# Patient Record
Sex: Male | Born: 1945
Health system: Southern US, Community
[De-identification: ages and names within clinical notes are randomized; demographics above are authoritative.]

## PROBLEM LIST (undated history)

## (undated) DIAGNOSIS — M4800 Spinal stenosis, site unspecified: Secondary | ICD-10-CM

## (undated) DIAGNOSIS — I495 Sick sinus syndrome: Secondary | ICD-10-CM

## (undated) DIAGNOSIS — I639 Cerebral infarction, unspecified: Secondary | ICD-10-CM

## (undated) DIAGNOSIS — R7989 Other specified abnormal findings of blood chemistry: Secondary | ICD-10-CM

## (undated) DIAGNOSIS — H47019 Ischemic optic neuropathy, unspecified eye: Secondary | ICD-10-CM

## (undated) DIAGNOSIS — R945 Abnormal results of liver function studies: Secondary | ICD-10-CM

## (undated) DIAGNOSIS — K219 Gastro-esophageal reflux disease without esophagitis: Secondary | ICD-10-CM

## (undated) DIAGNOSIS — R001 Bradycardia, unspecified: Secondary | ICD-10-CM

## (undated) DIAGNOSIS — K644 Residual hemorrhoidal skin tags: Secondary | ICD-10-CM

## (undated) DIAGNOSIS — I10 Essential (primary) hypertension: Secondary | ICD-10-CM

## (undated) DIAGNOSIS — L0291 Cutaneous abscess, unspecified: Secondary | ICD-10-CM

## (undated) DIAGNOSIS — E785 Hyperlipidemia, unspecified: Secondary | ICD-10-CM

## (undated) HISTORY — DX: Cutaneous abscess, unspecified: L02.91

## (undated) HISTORY — DX: Residual hemorrhoidal skin tags: K64.4

## (undated) HISTORY — PX: PACEMAKER INSERTION: SHX728

## (undated) HISTORY — DX: Other specified abnormal findings of blood chemistry: R79.89

## (undated) HISTORY — DX: Sick sinus syndrome: I49.5

## (undated) HISTORY — DX: Abnormal results of liver function studies: R94.5

## (undated) HISTORY — DX: Ischemic optic neuropathy, unspecified eye: H47.019

## (undated) HISTORY — PX: OTHER SURGICAL HISTORY: SHX169

## (undated) HISTORY — PX: EYE SURGERY: SHX253

## (undated) HISTORY — DX: Hyperlipidemia, unspecified: E78.5

## (undated) HISTORY — DX: Gastro-esophageal reflux disease without esophagitis: K21.9

## (undated) HISTORY — DX: Cerebral infarction, unspecified: I63.9

## (undated) HISTORY — DX: Bradycardia, unspecified: R00.1

## (undated) HISTORY — DX: Spinal stenosis, site unspecified: M48.00

## (undated) HISTORY — DX: Essential (primary) hypertension: I10

## (undated) HISTORY — PX: ABSCESS DRAINAGE: SHX1119

---

## 1994-11-30 HISTORY — PX: CARDIAC CATHETERIZATION: SHX172

## 2001-09-09 ENCOUNTER — Ambulatory Visit (HOSPITAL_COMMUNITY): Admission: RE | Admit: 2001-09-09 | Discharge: 2001-09-09 | Payer: Self-pay | Admitting: Cardiology

## 2001-09-09 ENCOUNTER — Encounter: Payer: Self-pay | Admitting: Internal Medicine

## 2001-09-13 ENCOUNTER — Encounter: Payer: Self-pay | Admitting: Internal Medicine

## 2002-07-22 ENCOUNTER — Emergency Department (HOSPITAL_COMMUNITY): Admission: EM | Admit: 2002-07-22 | Discharge: 2002-07-22 | Payer: Self-pay | Admitting: Emergency Medicine

## 2002-09-15 ENCOUNTER — Ambulatory Visit (HOSPITAL_COMMUNITY): Admission: RE | Admit: 2002-09-15 | Discharge: 2002-09-16 | Payer: Self-pay | Admitting: Internal Medicine

## 2002-09-15 ENCOUNTER — Encounter: Payer: Self-pay | Admitting: Internal Medicine

## 2002-09-16 ENCOUNTER — Encounter: Payer: Self-pay | Admitting: Internal Medicine

## 2003-02-07 ENCOUNTER — Observation Stay (HOSPITAL_COMMUNITY): Admission: RE | Admit: 2003-02-07 | Discharge: 2003-02-08 | Payer: Self-pay | Admitting: General Surgery

## 2003-02-07 ENCOUNTER — Encounter (INDEPENDENT_AMBULATORY_CARE_PROVIDER_SITE_OTHER): Payer: Self-pay | Admitting: *Deleted

## 2003-02-07 ENCOUNTER — Encounter: Payer: Self-pay | Admitting: General Surgery

## 2003-02-07 HISTORY — PX: CHOLECYSTECTOMY: SHX55

## 2004-06-23 ENCOUNTER — Encounter: Payer: Self-pay | Admitting: Internal Medicine

## 2004-06-23 HISTORY — PX: COLONOSCOPY: SHX174

## 2004-10-16 ENCOUNTER — Ambulatory Visit: Payer: Self-pay | Admitting: Internal Medicine

## 2004-11-25 ENCOUNTER — Ambulatory Visit: Payer: Self-pay

## 2005-01-01 ENCOUNTER — Ambulatory Visit: Payer: Self-pay | Admitting: Internal Medicine

## 2005-01-14 ENCOUNTER — Ambulatory Visit: Payer: Self-pay | Admitting: Internal Medicine

## 2005-01-27 ENCOUNTER — Ambulatory Visit: Payer: Self-pay

## 2005-04-03 ENCOUNTER — Ambulatory Visit: Payer: Self-pay | Admitting: Internal Medicine

## 2005-04-15 ENCOUNTER — Ambulatory Visit: Payer: Self-pay | Admitting: Internal Medicine

## 2005-09-16 ENCOUNTER — Ambulatory Visit: Payer: Self-pay | Admitting: Internal Medicine

## 2005-09-16 ENCOUNTER — Encounter: Admission: RE | Admit: 2005-09-16 | Discharge: 2005-09-16 | Payer: Self-pay | Admitting: Internal Medicine

## 2005-09-28 ENCOUNTER — Ambulatory Visit: Payer: Self-pay | Admitting: Internal Medicine

## 2005-10-29 ENCOUNTER — Ambulatory Visit: Payer: Self-pay | Admitting: Internal Medicine

## 2005-12-21 ENCOUNTER — Ambulatory Visit: Payer: Self-pay | Admitting: Internal Medicine

## 2005-12-28 ENCOUNTER — Ambulatory Visit: Payer: Self-pay | Admitting: Internal Medicine

## 2006-02-19 ENCOUNTER — Ambulatory Visit: Payer: Self-pay | Admitting: Internal Medicine

## 2006-04-16 ENCOUNTER — Ambulatory Visit: Payer: Self-pay | Admitting: Internal Medicine

## 2006-04-23 ENCOUNTER — Ambulatory Visit: Payer: Self-pay | Admitting: Internal Medicine

## 2006-07-23 ENCOUNTER — Ambulatory Visit: Payer: Self-pay | Admitting: Internal Medicine

## 2006-08-06 ENCOUNTER — Ambulatory Visit: Payer: Self-pay | Admitting: Internal Medicine

## 2006-08-11 ENCOUNTER — Encounter: Payer: Self-pay | Admitting: Internal Medicine

## 2006-08-11 ENCOUNTER — Ambulatory Visit: Payer: Self-pay | Admitting: Internal Medicine

## 2006-11-03 ENCOUNTER — Ambulatory Visit: Payer: Self-pay | Admitting: Internal Medicine

## 2006-11-03 LAB — CONVERTED CEMR LAB
ALT: 26 units/L (ref 0–40)
AST: 21 units/L (ref 0–37)
Albumin: 4.3 g/dL (ref 3.5–5.2)
Alkaline Phosphatase: 69 units/L (ref 39–117)
BUN: 23 mg/dL (ref 6–23)
Basophils Absolute: 0.1 10*3/uL (ref 0.0–0.1)
Basophils Relative: 1 % (ref 0.0–1.0)
CO2: 30 meq/L (ref 19–32)
Calcium: 9.7 mg/dL (ref 8.4–10.5)
Chloride: 107 meq/L (ref 96–112)
Chol/HDL Ratio, serum: 3.5
Cholesterol: 155 mg/dL (ref 0–200)
Creatinine, Ser: 1.4 mg/dL (ref 0.4–1.5)
Eosinophil percent: 2.5 % (ref 0.0–5.0)
GFR calc non Af Amer: 55 mL/min
Glomerular Filtration Rate, Af Am: 66 mL/min/{1.73_m2}
Glucose, Bld: 101 mg/dL — ABNORMAL HIGH (ref 70–99)
HCT: 46.9 % (ref 39.0–52.0)
HDL: 44.2 mg/dL (ref >39.0)
Hemoglobin: 15.3 g/dL (ref 13.0–17.0)
LDL DIRECT: 84.4 mg/dL
Lymphocytes Relative: 21.3 % (ref 12.0–46.0)
MCHC: 32.6 g/dL (ref 30.0–36.0)
MCV: 89.7 fL (ref 78.0–100.0)
Monocytes Absolute: 0.7 10*3/uL (ref 0.2–0.7)
Monocytes Relative: 7.6 % (ref 3.0–11.0)
Neutro Abs: 5.9 10*3/uL (ref 1.4–7.7)
Neutrophils Relative %: 67.6 % (ref 43.0–77.0)
PSA: 0.84 ng/mL (ref 0.10–4.00)
Platelets: 249 10*3/uL (ref 150–400)
Potassium: 4.6 meq/L (ref 3.5–5.1)
RBC: 5.22 M/uL (ref 4.22–5.81)
RDW: 12.7 % (ref 11.5–14.6)
Sodium: 143 meq/L (ref 135–145)
TSH: 4.14 microintl units/mL (ref 0.35–5.50)
Total Bilirubin: 1.3 mg/dL — ABNORMAL HIGH (ref 0.3–1.2)
Total Protein: 7 g/dL (ref 6.0–8.3)
Triglyceride fasting, serum: 215 mg/dL (ref 0–149)
VLDL: 43 mg/dL — ABNORMAL HIGH (ref 0–40)
WBC: 8.8 10*3/uL (ref 4.5–10.5)

## 2006-11-16 ENCOUNTER — Ambulatory Visit: Payer: Self-pay | Admitting: Internal Medicine

## 2007-01-24 ENCOUNTER — Ambulatory Visit: Payer: Self-pay | Admitting: Internal Medicine

## 2007-01-24 LAB — CONVERTED CEMR LAB
ALT: 26 units/L (ref 0–40)
AST: 20 units/L (ref 0–37)
Albumin: 4 g/dL (ref 3.5–5.2)
Alkaline Phosphatase: 83 units/L (ref 39–117)
Bilirubin, Direct: 0.1 mg/dL (ref 0.0–0.3)
Cholesterol: 282 mg/dL (ref 0–200)
Direct LDL: 146.1 mg/dL
HDL: 37.4 mg/dL — ABNORMAL LOW (ref >39.0)
Total Bilirubin: 1.1 mg/dL (ref 0.3–1.2)
Total CHOL/HDL Ratio: 7.5
Total Protein: 6.6 g/dL (ref 6.0–8.3)
Triglycerides: 640 mg/dL (ref 0–149)
VLDL: 128 mg/dL — ABNORMAL HIGH (ref 0–40)

## 2007-02-09 ENCOUNTER — Ambulatory Visit: Payer: Self-pay | Admitting: Internal Medicine

## 2007-03-09 ENCOUNTER — Ambulatory Visit: Payer: Self-pay | Admitting: Internal Medicine

## 2007-03-09 LAB — CONVERTED CEMR LAB
ALT: 24 units/L (ref 0–40)
AST: 18 units/L (ref 0–37)
Albumin: 4 g/dL (ref 3.5–5.2)
Alkaline Phosphatase: 74 units/L (ref 39–117)
BUN: 20 mg/dL (ref 6–23)
Bilirubin, Direct: 0.1 mg/dL (ref 0.0–0.3)
CO2: 31 meq/L (ref 19–32)
Calcium: 9.4 mg/dL (ref 8.4–10.5)
Chloride: 108 meq/L (ref 96–112)
Cholesterol: 163 mg/dL (ref 0–200)
Creatinine, Ser: 1.2 mg/dL (ref 0.4–1.5)
Direct LDL: 78.7 mg/dL
GFR calc Af Amer: 79 mL/min
GFR calc non Af Amer: 66 mL/min
Glucose, Bld: 100 mg/dL — ABNORMAL HIGH (ref 70–99)
HDL: 39.1 mg/dL (ref >39.0)
Potassium: 4.5 meq/L (ref 3.5–5.1)
Sodium: 143 meq/L (ref 135–145)
Total Bilirubin: 1.2 mg/dL (ref 0.3–1.2)
Total CHOL/HDL Ratio: 4.2
Total Protein: 6.6 g/dL (ref 6.0–8.3)
Triglycerides: 317 mg/dL (ref 0–149)
VLDL: 63 mg/dL — ABNORMAL HIGH (ref 0–40)

## 2007-03-16 ENCOUNTER — Ambulatory Visit: Payer: Self-pay | Admitting: Internal Medicine

## 2007-06-02 DIAGNOSIS — H47019 Ischemic optic neuropathy, unspecified eye: Secondary | ICD-10-CM

## 2007-06-02 DIAGNOSIS — I498 Other specified cardiac arrhythmias: Secondary | ICD-10-CM

## 2007-06-02 DIAGNOSIS — I10 Essential (primary) hypertension: Secondary | ICD-10-CM

## 2007-06-30 ENCOUNTER — Ambulatory Visit: Payer: Self-pay | Admitting: Internal Medicine

## 2007-06-30 LAB — CONVERTED CEMR LAB
ALT: 61 units/L — ABNORMAL HIGH (ref 0–53)
AST: 27 units/L (ref 0–37)
Albumin: 4 g/dL (ref 3.5–5.2)
Alkaline Phosphatase: 85 units/L (ref 39–117)
Bilirubin, Direct: 0.2 mg/dL (ref 0.0–0.3)
Cholesterol: 204 mg/dL (ref 0–200)
Direct LDL: 112.4 mg/dL
HDL: 30.7 mg/dL — ABNORMAL LOW (ref >39.0)
Total Bilirubin: 1.5 mg/dL — ABNORMAL HIGH (ref 0.3–1.2)
Total CHOL/HDL Ratio: 6.6
Total Protein: 6.5 g/dL (ref 6.0–8.3)
Triglycerides: 319 mg/dL (ref 0–149)
VLDL: 64 mg/dL — ABNORMAL HIGH (ref 0–40)

## 2007-07-12 DIAGNOSIS — E785 Hyperlipidemia, unspecified: Secondary | ICD-10-CM

## 2007-07-12 HISTORY — DX: Hyperlipidemia, unspecified: E78.5

## 2007-07-13 ENCOUNTER — Ambulatory Visit: Payer: Self-pay | Admitting: Internal Medicine

## 2007-07-19 ENCOUNTER — Ambulatory Visit: Payer: Self-pay | Admitting: Cardiology

## 2007-11-08 ENCOUNTER — Ambulatory Visit: Payer: Self-pay | Admitting: Internal Medicine

## 2007-11-08 LAB — CONVERTED CEMR LAB
ALT: 32 units/L (ref 0–53)
AST: 24 units/L (ref 0–37)
Albumin: 4.1 g/dL (ref 3.5–5.2)
Alkaline Phosphatase: 86 units/L (ref 39–117)
BUN: 21 mg/dL (ref 6–23)
Bilirubin, Direct: 0.2 mg/dL (ref 0.0–0.3)
CO2: 29 meq/L (ref 19–32)
Calcium: 9.9 mg/dL (ref 8.4–10.5)
Chloride: 102 meq/L (ref 96–112)
Cholesterol: 229 mg/dL (ref 0–200)
Creatinine, Ser: 1.4 mg/dL (ref 0.4–1.5)
Direct LDL: 119.5 mg/dL
GFR calc Af Amer: 66 mL/min
GFR calc non Af Amer: 55 mL/min
Glucose, Bld: 98 mg/dL (ref 70–99)
HDL: 35 mg/dL — ABNORMAL LOW (ref >39.0)
Potassium: 4.4 meq/L (ref 3.5–5.1)
Sodium: 138 meq/L (ref 135–145)
Total Bilirubin: 1.3 mg/dL — ABNORMAL HIGH (ref 0.3–1.2)
Total CHOL/HDL Ratio: 6.5
Total Protein: 6.5 g/dL (ref 6.0–8.3)
Triglycerides: 428 mg/dL (ref 0–149)
VLDL: 86 mg/dL — ABNORMAL HIGH (ref 0–40)

## 2007-11-14 ENCOUNTER — Ambulatory Visit: Payer: Self-pay | Admitting: Internal Medicine

## 2007-12-20 ENCOUNTER — Ambulatory Visit: Payer: Self-pay | Admitting: Cardiology

## 2008-02-13 ENCOUNTER — Telehealth: Payer: Self-pay | Admitting: Internal Medicine

## 2008-02-21 ENCOUNTER — Ambulatory Visit: Payer: Self-pay | Admitting: Internal Medicine

## 2008-02-27 ENCOUNTER — Ambulatory Visit: Payer: Self-pay | Admitting: Internal Medicine

## 2008-02-27 LAB — CONVERTED CEMR LAB
ALT: 20 units/L (ref 0–53)
AST: 19 units/L (ref 0–37)
Albumin: 3.9 g/dL (ref 3.5–5.2)
Alkaline Phosphatase: 70 units/L (ref 39–117)
Bilirubin Urine: NEGATIVE
Calcium: 9.6 mg/dL (ref 8.4–10.5)
Chloride: 105 meq/L (ref 96–112)
Direct LDL: 86.4 mg/dL
Eosinophils Relative: 2.9 % (ref 0.0–5.0)
GFR calc Af Amer: 72 mL/min
GFR calc non Af Amer: 60 mL/min
Glucose, Bld: 104 mg/dL — ABNORMAL HIGH (ref 70–99)
Glucose, Urine, Semiquant: NEGATIVE
Lymphocytes Relative: 24.5 % (ref 12.0–46.0)
MCHC: 33.3 g/dL (ref 30.0–36.0)
MCV: 87.3 fL (ref 78.0–100.0)
Neutrophils Relative %: 63.9 % (ref 43.0–77.0)
Nitrite: NEGATIVE
Protein, U semiquant: NEGATIVE
Specific Gravity, Urine: 1.025
Total Bilirubin: 1 mg/dL (ref 0.3–1.2)
Total CHOL/HDL Ratio: 6.3
Total Protein: 6.7 g/dL (ref 6.0–8.3)
Urobilinogen, UA: 0.2
WBC Urine, dipstick: NEGATIVE

## 2008-03-01 ENCOUNTER — Ambulatory Visit: Payer: Self-pay | Admitting: Internal Medicine

## 2008-03-07 ENCOUNTER — Ambulatory Visit: Payer: Self-pay | Admitting: Internal Medicine

## 2008-03-07 ENCOUNTER — Ambulatory Visit (HOSPITAL_COMMUNITY): Admission: RE | Admit: 2008-03-07 | Discharge: 2008-03-07 | Payer: Self-pay | Admitting: Internal Medicine

## 2008-04-09 ENCOUNTER — Ambulatory Visit: Payer: Self-pay | Admitting: Internal Medicine

## 2008-04-25 ENCOUNTER — Ambulatory Visit: Payer: Self-pay | Admitting: Internal Medicine

## 2008-04-25 LAB — CONVERTED CEMR LAB
Cholesterol: 176 mg/dL (ref 0–200)
Direct LDL: 85.8 mg/dL
Triglycerides: 283 mg/dL (ref 0–149)

## 2008-05-02 ENCOUNTER — Ambulatory Visit: Payer: Self-pay | Admitting: Internal Medicine

## 2008-05-10 ENCOUNTER — Ambulatory Visit: Payer: Self-pay

## 2008-08-02 ENCOUNTER — Ambulatory Visit: Payer: Self-pay

## 2008-09-27 ENCOUNTER — Ambulatory Visit: Payer: Self-pay | Admitting: Internal Medicine

## 2008-09-27 LAB — CONVERTED CEMR LAB
AST: 25 units/L (ref 0–37)
BUN: 20 mg/dL (ref 6–23)
Basophils Absolute: 0 10*3/uL (ref 0.0–0.1)
Basophils Relative: 0.5 % (ref 0.0–3.0)
Bilirubin, Direct: 0.1 mg/dL (ref 0.0–0.3)
CO2: 31 meq/L (ref 19–32)
Calcium: 9.3 mg/dL (ref 8.4–10.5)
Cholesterol: 160 mg/dL (ref 0–200)
Eosinophils Absolute: 0.3 10*3/uL (ref 0.0–0.7)
Eosinophils Relative: 4.6 % (ref 0.0–5.0)
Glucose, Urine, Semiquant: NEGATIVE
Ketones, urine, test strip: NEGATIVE
Lymphocytes Relative: 26.6 % (ref 12.0–46.0)
MCHC: 34.9 g/dL (ref 30.0–36.0)
MCV: 89 fL (ref 78.0–100.0)
Monocytes Relative: 9.3 % (ref 3.0–12.0)
Neutro Abs: 3.5 10*3/uL (ref 1.4–7.7)
Neutrophils Relative %: 59 % (ref 43.0–77.0)
PSA: 0.98 ng/mL (ref 0.10–4.00)
RDW: 12.8 % (ref 11.5–14.6)
Total Bilirubin: 1.1 mg/dL (ref 0.3–1.2)
Triglycerides: 140 mg/dL (ref 0–149)
VLDL: 28 mg/dL (ref 0–40)
WBC Urine, dipstick: NEGATIVE

## 2008-10-04 ENCOUNTER — Ambulatory Visit: Payer: Self-pay | Admitting: Internal Medicine

## 2008-10-30 ENCOUNTER — Ambulatory Visit: Payer: Self-pay | Admitting: Internal Medicine

## 2008-11-14 ENCOUNTER — Ambulatory Visit: Payer: Self-pay | Admitting: Internal Medicine

## 2008-11-14 LAB — CONVERTED CEMR LAB
ALT: 30 units/L (ref 0–53)
AST: 21 units/L (ref 0–37)
Alkaline Phosphatase: 68 units/L (ref 39–117)
BUN: 17 mg/dL (ref 6–23)
Basophils Relative: 0.3 % (ref 0.0–3.0)
Creatinine, Ser: 1.2 mg/dL (ref 0.4–1.5)
Eosinophils Absolute: 0.4 10*3/uL (ref 0.0–0.7)
GFR calc Af Amer: 79 mL/min
Hemoglobin: 15.1 g/dL (ref 13.0–17.0)
MCHC: 34.3 g/dL (ref 30.0–36.0)
Monocytes Relative: 5.8 % (ref 3.0–12.0)
Neutro Abs: 6.5 10*3/uL (ref 1.4–7.7)
Neutrophils Relative %: 68.5 % (ref 43.0–77.0)
Platelets: 247 10*3/uL (ref 150–400)
Potassium: 4.3 meq/L (ref 3.5–5.1)
RDW: 12.9 % (ref 11.5–14.6)
Sodium: 143 meq/L (ref 135–145)
Total Bilirubin: 0.9 mg/dL (ref 0.3–1.2)
WBC: 9.3 10*3/uL (ref 4.5–10.5)

## 2008-12-25 ENCOUNTER — Encounter: Payer: Self-pay | Admitting: Internal Medicine

## 2009-02-04 ENCOUNTER — Telehealth: Payer: Self-pay | Admitting: Internal Medicine

## 2009-02-05 ENCOUNTER — Telehealth: Payer: Self-pay | Admitting: *Deleted

## 2009-02-26 ENCOUNTER — Ambulatory Visit: Payer: Self-pay | Admitting: Internal Medicine

## 2009-02-26 LAB — CONVERTED CEMR LAB
AST: 30 units/L (ref 0–37)
Alkaline Phosphatase: 65 units/L (ref 39–117)
Basophils Relative: 0.6 % (ref 0.0–3.0)
Bilirubin, Direct: 0 mg/dL (ref 0.0–0.3)
CO2: 29 meq/L (ref 19–32)
Chloride: 107 meq/L (ref 96–112)
Cholesterol: 185 mg/dL (ref 0–200)
Eosinophils Absolute: 0.3 10*3/uL (ref 0.0–0.7)
Eosinophils Relative: 3.2 % (ref 0.0–5.0)
Glucose, Bld: 97 mg/dL (ref 70–99)
HCT: 43.8 % (ref 39.0–52.0)
Lymphocytes Relative: 27.6 % (ref 12.0–46.0)
MCHC: 34.5 g/dL (ref 30.0–36.0)
MCV: 88.8 fL (ref 78.0–100.0)
Monocytes Relative: 8.1 % (ref 3.0–12.0)
Potassium: 4 meq/L (ref 3.5–5.1)
RDW: 12.6 % (ref 11.5–14.6)
Total Bilirubin: 1.2 mg/dL (ref 0.3–1.2)
VLDL: 65 mg/dL — ABNORMAL HIGH (ref 0.0–40.0)
WBC: 8.1 10*3/uL (ref 4.5–10.5)

## 2009-03-04 ENCOUNTER — Ambulatory Visit: Payer: Self-pay | Admitting: Internal Medicine

## 2009-03-23 ENCOUNTER — Encounter (INDEPENDENT_AMBULATORY_CARE_PROVIDER_SITE_OTHER): Payer: Self-pay | Admitting: *Deleted

## 2009-04-08 ENCOUNTER — Ambulatory Visit: Payer: Self-pay | Admitting: Internal Medicine

## 2009-04-08 LAB — CONVERTED CEMR LAB
AST: 21 units/L (ref 0–37)
Albumin: 3.8 g/dL (ref 3.5–5.2)
Alkaline Phosphatase: 72 units/L (ref 39–117)
Direct LDL: 80.6 mg/dL
HDL: 37.8 mg/dL — ABNORMAL LOW (ref >39.00)
Total Bilirubin: 1.1 mg/dL (ref 0.3–1.2)
Total CHOL/HDL Ratio: 4
Triglycerides: 246 mg/dL — ABNORMAL HIGH (ref 0.0–149.0)

## 2009-04-12 ENCOUNTER — Encounter: Payer: Self-pay | Admitting: Internal Medicine

## 2009-04-26 ENCOUNTER — Encounter (INDEPENDENT_AMBULATORY_CARE_PROVIDER_SITE_OTHER): Payer: Self-pay | Admitting: *Deleted

## 2009-05-06 ENCOUNTER — Ambulatory Visit: Payer: Self-pay | Admitting: Internal Medicine

## 2009-06-06 ENCOUNTER — Telehealth (INDEPENDENT_AMBULATORY_CARE_PROVIDER_SITE_OTHER): Payer: Self-pay | Admitting: *Deleted

## 2009-08-21 ENCOUNTER — Encounter: Payer: Self-pay | Admitting: Internal Medicine

## 2009-10-30 ENCOUNTER — Ambulatory Visit: Payer: Self-pay | Admitting: Family Medicine

## 2009-10-30 DIAGNOSIS — B029 Zoster without complications: Secondary | ICD-10-CM | POA: Insufficient documentation

## 2009-11-05 ENCOUNTER — Ambulatory Visit: Payer: Self-pay | Admitting: Internal Medicine

## 2009-11-05 LAB — CONVERTED CEMR LAB
Albumin: 4.2 g/dL (ref 3.5–5.2)
BUN: 19 mg/dL (ref 6–23)
Bilirubin, Direct: 0 mg/dL (ref 0.0–0.3)
CO2: 28 meq/L (ref 19–32)
Cholesterol: 201 mg/dL — ABNORMAL HIGH (ref 0–200)
Creatinine, Ser: 1.4 mg/dL (ref 0.4–1.5)
Glucose, Bld: 98 mg/dL (ref 70–99)
HDL: 43.4 mg/dL (ref >39.00)
Potassium: 4.4 meq/L (ref 3.5–5.1)
Total Bilirubin: 1 mg/dL (ref 0.3–1.2)
Total CHOL/HDL Ratio: 5
Triglycerides: 221 mg/dL — ABNORMAL HIGH (ref 0.0–149.0)
VLDL: 44.2 mg/dL — ABNORMAL HIGH (ref 0.0–40.0)

## 2009-11-11 ENCOUNTER — Ambulatory Visit: Payer: Self-pay | Admitting: Internal Medicine

## 2009-11-11 DIAGNOSIS — M109 Gout, unspecified: Secondary | ICD-10-CM | POA: Insufficient documentation

## 2009-11-11 DIAGNOSIS — R21 Rash and other nonspecific skin eruption: Secondary | ICD-10-CM | POA: Insufficient documentation

## 2009-11-11 HISTORY — DX: Gout, unspecified: M10.9

## 2009-11-12 LAB — CONVERTED CEMR LAB
Chloride: 103 meq/L (ref 96–112)
Creatinine, Ser: 1.4 mg/dL (ref 0.4–1.5)
Potassium: 4.8 meq/L (ref 3.5–5.1)

## 2009-12-20 ENCOUNTER — Ambulatory Visit: Payer: Self-pay | Admitting: Internal Medicine

## 2009-12-28 DIAGNOSIS — Z950 Presence of cardiac pacemaker: Secondary | ICD-10-CM

## 2009-12-28 HISTORY — DX: Presence of cardiac pacemaker: Z95.0

## 2010-01-01 ENCOUNTER — Encounter: Payer: Self-pay | Admitting: Internal Medicine

## 2010-01-15 ENCOUNTER — Encounter: Payer: Self-pay | Admitting: Internal Medicine

## 2010-01-21 ENCOUNTER — Encounter: Payer: Self-pay | Admitting: Internal Medicine

## 2010-02-17 ENCOUNTER — Encounter (INDEPENDENT_AMBULATORY_CARE_PROVIDER_SITE_OTHER): Payer: Self-pay | Admitting: *Deleted

## 2010-02-17 ENCOUNTER — Ambulatory Visit: Payer: Self-pay | Admitting: Internal Medicine

## 2010-02-17 DIAGNOSIS — M79609 Pain in unspecified limb: Secondary | ICD-10-CM

## 2010-02-17 DIAGNOSIS — R109 Unspecified abdominal pain: Secondary | ICD-10-CM

## 2010-02-17 LAB — CONVERTED CEMR LAB
Bilirubin Urine: NEGATIVE
Blood in Urine, dipstick: NEGATIVE
Ketones, urine, test strip: NEGATIVE
Nitrite: NEGATIVE
Protein, U semiquant: NEGATIVE
Rhuematoid fact SerPl-aCnc: 20.1 intl units/mL — ABNORMAL HIGH (ref 0.0–20.0)
Specific Gravity, Urine: <1.005
Urobilinogen, UA: 0.2
WBC Urine, dipstick: NEGATIVE
pH: 5.5

## 2010-02-18 ENCOUNTER — Ambulatory Visit: Payer: Self-pay | Admitting: Internal Medicine

## 2010-02-19 DIAGNOSIS — K644 Residual hemorrhoidal skin tags: Secondary | ICD-10-CM | POA: Insufficient documentation

## 2010-02-19 LAB — CONVERTED CEMR LAB: Anti Nuclear Antibody(ANA): NEGATIVE

## 2010-02-25 ENCOUNTER — Ambulatory Visit: Payer: Self-pay | Admitting: Internal Medicine

## 2010-02-25 DIAGNOSIS — D126 Benign neoplasm of colon, unspecified: Secondary | ICD-10-CM | POA: Insufficient documentation

## 2010-03-04 ENCOUNTER — Ambulatory Visit: Payer: Self-pay | Admitting: Internal Medicine

## 2010-03-06 ENCOUNTER — Telehealth: Payer: Self-pay | Admitting: Internal Medicine

## 2010-03-11 ENCOUNTER — Telehealth: Payer: Self-pay | Admitting: Internal Medicine

## 2010-03-14 ENCOUNTER — Encounter: Admission: RE | Admit: 2010-03-14 | Discharge: 2010-03-14 | Payer: Self-pay | Admitting: Internal Medicine

## 2010-03-17 ENCOUNTER — Ambulatory Visit: Payer: Self-pay | Admitting: Internal Medicine

## 2010-03-17 DIAGNOSIS — K219 Gastro-esophageal reflux disease without esophagitis: Secondary | ICD-10-CM | POA: Insufficient documentation

## 2010-03-17 DIAGNOSIS — R079 Chest pain, unspecified: Secondary | ICD-10-CM

## 2010-03-20 ENCOUNTER — Telehealth: Payer: Self-pay | Admitting: Internal Medicine

## 2010-03-21 ENCOUNTER — Ambulatory Visit (HOSPITAL_COMMUNITY): Admission: RE | Admit: 2010-03-21 | Discharge: 2010-03-21 | Payer: Self-pay | Admitting: Diagnostic Radiology

## 2010-03-25 ENCOUNTER — Telehealth: Payer: Self-pay | Admitting: Internal Medicine

## 2010-04-08 ENCOUNTER — Encounter: Payer: Self-pay | Admitting: Internal Medicine

## 2010-05-29 ENCOUNTER — Telehealth: Payer: Self-pay | Admitting: Internal Medicine

## 2010-05-29 DIAGNOSIS — M159 Polyosteoarthritis, unspecified: Secondary | ICD-10-CM | POA: Insufficient documentation

## 2010-05-29 DIAGNOSIS — M25559 Pain in unspecified hip: Secondary | ICD-10-CM | POA: Insufficient documentation

## 2010-06-06 ENCOUNTER — Telehealth: Payer: Self-pay | Admitting: Internal Medicine

## 2010-06-18 ENCOUNTER — Encounter: Payer: Self-pay | Admitting: Internal Medicine

## 2010-06-23 ENCOUNTER — Ambulatory Visit: Payer: Self-pay

## 2010-06-23 ENCOUNTER — Encounter: Payer: Self-pay | Admitting: Internal Medicine

## 2010-07-21 ENCOUNTER — Telehealth: Payer: Self-pay | Admitting: Internal Medicine

## 2010-08-08 ENCOUNTER — Telehealth: Payer: Self-pay | Admitting: Internal Medicine

## 2010-08-19 ENCOUNTER — Telehealth: Payer: Self-pay | Admitting: Internal Medicine

## 2010-08-21 ENCOUNTER — Telehealth: Payer: Self-pay | Admitting: *Deleted

## 2010-08-22 ENCOUNTER — Ambulatory Visit: Payer: Self-pay | Admitting: Internal Medicine

## 2010-08-22 DIAGNOSIS — M48061 Spinal stenosis, lumbar region without neurogenic claudication: Secondary | ICD-10-CM | POA: Insufficient documentation

## 2010-08-22 LAB — CONVERTED CEMR LAB
ALT: 19 units/L (ref 0–53)
Albumin: 4 g/dL (ref 3.5–5.2)
BUN: 30 mg/dL — ABNORMAL HIGH (ref 6–23)
Basophils Relative: 0.4 % (ref 0.0–3.0)
Bilirubin, Direct: 0.2 mg/dL (ref 0.0–0.3)
Calcium: 9.2 mg/dL (ref 8.4–10.5)
Cholesterol: 190 mg/dL (ref 0–200)
Creatinine, Ser: 1.3 mg/dL (ref 0.4–1.5)
Glucose, Bld: 74 mg/dL (ref 70–99)
HCT: 42.2 % (ref 39.0–52.0)
Lymphocytes Relative: 30.6 % (ref 12.0–46.0)
Lymphs Abs: 2.1 10*3/uL (ref 0.7–4.0)
MCHC: 34.6 g/dL (ref 30.0–36.0)
Monocytes Absolute: 0.6 10*3/uL (ref 0.1–1.0)
Monocytes Relative: 8.7 % (ref 3.0–12.0)
Platelets: 198 10*3/uL (ref 150.0–400.0)
Potassium: 4.2 meq/L (ref 3.5–5.1)
RBC: 4.8 M/uL (ref 4.22–5.81)
Sodium: 139 meq/L (ref 135–145)
Total CHOL/HDL Ratio: 4

## 2010-09-01 ENCOUNTER — Ambulatory Visit: Payer: Self-pay | Admitting: Internal Medicine

## 2010-09-04 ENCOUNTER — Telehealth: Payer: Self-pay | Admitting: Internal Medicine

## 2010-09-29 ENCOUNTER — Ambulatory Visit: Payer: Self-pay | Admitting: Internal Medicine

## 2010-10-06 ENCOUNTER — Telehealth: Payer: Self-pay | Admitting: Internal Medicine

## 2010-12-02 ENCOUNTER — Telehealth: Payer: Self-pay | Admitting: Internal Medicine

## 2010-12-05 ENCOUNTER — Other Ambulatory Visit: Payer: Self-pay | Admitting: Internal Medicine

## 2010-12-05 ENCOUNTER — Ambulatory Visit
Admission: RE | Admit: 2010-12-05 | Discharge: 2010-12-05 | Payer: Self-pay | Source: Home / Self Care | Attending: Internal Medicine | Admitting: Internal Medicine

## 2010-12-05 ENCOUNTER — Encounter: Payer: Self-pay | Admitting: Internal Medicine

## 2010-12-05 LAB — LIPID PANEL
Cholesterol: 173 mg/dL (ref 0–200)
HDL: 43.8 mg/dL (ref >39.00)
Total CHOL/HDL Ratio: 4
Triglycerides: 205 mg/dL — ABNORMAL HIGH (ref 0.0–149.0)
VLDL: 41 mg/dL — ABNORMAL HIGH (ref 0.0–40.0)

## 2010-12-05 LAB — BASIC METABOLIC PANEL
BUN: 28 mg/dL — ABNORMAL HIGH (ref 6–23)
CO2: 28 mEq/L (ref 19–32)
Calcium: 9.8 mg/dL (ref 8.4–10.5)
Chloride: 106 mEq/L (ref 96–112)
Creatinine, Ser: 1.2 mg/dL (ref 0.4–1.5)
GFR: 62.94 mL/min (ref >60.00)
Glucose, Bld: 100 mg/dL — ABNORMAL HIGH (ref 70–99)
Potassium: 4.7 mEq/L (ref 3.5–5.1)
Sodium: 142 mEq/L (ref 135–145)

## 2010-12-05 LAB — HEPATIC FUNCTION PANEL
ALT: 27 U/L (ref 0–53)
AST: 25 U/L (ref 0–37)
Albumin: 4.1 g/dL (ref 3.5–5.2)
Alkaline Phosphatase: 69 U/L (ref 39–117)
Bilirubin, Direct: 0.2 mg/dL (ref 0.0–0.3)
Total Bilirubin: 1.4 mg/dL — ABNORMAL HIGH (ref 0.3–1.2)
Total Protein: 6.8 g/dL (ref 6.0–8.3)

## 2010-12-05 LAB — LDL CHOLESTEROL, DIRECT: Direct LDL: 101.6 mg/dL

## 2010-12-18 ENCOUNTER — Encounter: Payer: Self-pay | Admitting: Internal Medicine

## 2010-12-18 ENCOUNTER — Ambulatory Visit
Admission: RE | Admit: 2010-12-18 | Discharge: 2010-12-18 | Payer: Self-pay | Source: Home / Self Care | Attending: Internal Medicine | Admitting: Internal Medicine

## 2010-12-19 ENCOUNTER — Telehealth: Payer: Self-pay | Admitting: Internal Medicine

## 2010-12-22 ENCOUNTER — Encounter: Payer: Self-pay | Admitting: Internal Medicine

## 2010-12-22 ENCOUNTER — Telehealth: Payer: Self-pay | Admitting: Internal Medicine

## 2010-12-29 ENCOUNTER — Encounter: Payer: Self-pay | Admitting: Internal Medicine

## 2010-12-30 ENCOUNTER — Encounter: Payer: Self-pay | Admitting: Internal Medicine

## 2010-12-30 NOTE — Assessment & Plan Note (Signed)
Summary: pc2 sl  Medications Added CRESTOR 20 MG TABS (ROSUVASTATIN CALCIUM) 1/4 tablet by mouth daily or as directed GLUCOSAMINE SULFATE   POWD (GLUCOSAMINE SULFATE) once daily NAPROSYN 500 MG TABS (NAPROXEN) Take 1 tablet by mouth two times a day with food as needed      Allergies Added: NKDA  Visit Type:  Follow-up Primary Provider:  Birdie Sons MD   History of Present Illness: [name] returns today for followup.  He is a pleasant 65 yo man with a h/o symptomatic bradycardia who is s/p PPM. He has HTN.  He continues to exercise regularly.  He denies c/p, sob or peripheral edema.  No other complaints today except for some mild arthritis.  Current Medications (verified): 1)  Aspirin Ec 325 Mg  Tbec (Aspirin) .... Once Daily 2)  Benazepril Hcl 5 Mg Tabs (Benazepril Hcl) .... One Q Day 3)  Crestor 20 Mg Tabs (Rosuvastatin Calcium) .... 1/4 Tablet By Mouth Daily or As Directed 4)  Glucosamine Sulfate   Powd (Glucosamine Sulfate) .... Once Daily 5)  Multivitamins   Tabs (Multiple Vitamin) .... Once Daily 6)  Fish Oil 300 Mg Caps (Omega-3 Fatty Acids) .... Once Daily 7)  Naprosyn 500 Mg Tabs (Naproxen) .... Take 1 Tablet By Mouth Two Times A Day With Food As Needed  Allergies (verified): No Known Drug Allergies  Past History:  Past Medical History: Last updated: 07/12/2007 Hypertension Cerebrovascular accident, hx of Ischemic Optic Neuropathy Hyperlipidemia Stroke Bradycardia  Review of Systems  The patient denies chest pain, syncope, dyspnea on exertion, and peripheral edema.    Vital Signs:  Patient profile:   65 year old male Height:      67.75 inches Weight:      162 pounds BMI:     24.90 Pulse rate:   64 / minute BP sitting:   120 / 82  (left arm)  Physical Exam  General:  Well developed, well nourished, in no acute distress.  HEENT: normal Neck: supple. No JVD. Carotids 2+ bilaterally no bruits Cor: RRR no rubs, gallops or murmur Lungs: CTA.  Well  healed PPM incision. Ab: soft, nontender. nondistended. No HSM. Good bowel sounds Ext: warm. no cyanosis, clubbing or edema Neuro: alert and oriented. Grossly nonfocal. affect pleasant    PPM Specifications Following MD:  [name], MD     PPM Vendor:  [employer]     PPM Model Number:  860     PPM Serial Number:  [id number] PPM DOI:  09/15/2002     PPM Implanting MD:  [name], MD  Lead 1    Location: RA     DOI: 09/15/2002     Model #: [id number]     Serial #: [id number]     Status: active Lead 2    Location: RV     DOI: 09/15/2002     Model #: [id number]     Serial #: [id number]     Status: active   Indications:  BRADYCARDIA  with Sick sinus syndrome   PPM Follow Up Remote Check?  No Battery Voltage:  good V     Pacer Dependent:  No     Right Ventricle  Amplitude: 4.0 mV, Impedance: 650 ohms, Threshold: 0.65 V at 0.4 msec  Parameters Mode:  AAIR     Lower Rate Limit:  60     Upper Rate Limit:  170 Next Cardiology Appt Due:  05/30/2010 Tech Comments:  No parameter changes.  Device function normal.  TTM's with  [employer].  0.5% heart rates > 160bpm. ROV 6 months clinic. Altha Harm, LPN  December 20, 2009 4:30 PM  MD Comments:  Agree with above.  Impression & Recommendations:  Problem # 1:  CARDIAC PACEMAKER IN SITU (ICD-V45.01) His device is working normally.  Will recheck in one year.  Problem # 2:  HYPERTENSION (ICD-401.9) His blood pressure has been well controlled. A low sodium diet is recommended. His updated medication list for this problem includes:    Aspirin Ec 325 Mg Tbec (Aspirin) ..... Once daily    Benazepril Hcl 5 Mg Tabs (Benazepril hcl) ..... One q day  Patient Instructions: 1)  Your physician recommends that you schedule a follow-up appointment in: 6 months with device clinic

## 2010-12-30 NOTE — Progress Notes (Signed)
Summary: REQUEST  Phone Note Call from Patient Call back at Home Phone [phone]   Caller: Patient---live call Summary of Call: wants meds for hip pain in addition to the IB that he is taking. Wanted to speak with Alvino Chapel Initial call taken by: Warnell Forester,  March 20, 2010 4:22 PM  Follow-up for Phone Call        states excruciating pain in hip where had inj.  Has called that doctor and he suggested he use Ibuprofen and it will get better.  Has tried and can now stand, but still in pain and would like something for trip on 4/26 to [facility].  Is aware I may not reach you today. Follow-up by: Gladis Riffle, RN,  March 20, 2010 4:33 PM  Additional Follow-up for Phone Call Additional follow up Details #1::        Vicodin 5/325 one p.o. q.4 hours p.r.n. #30/no refills Additional Follow-up by: Birdie Sons MD,  March 24, 2010 11:46 AM    Additional Follow-up for Phone Call Additional follow up Details #2::    called back and does not need vicodin.  Was given percocet 5-325 by radiologist that injected hip.  Asking if can have steroid orally to calm pain and inflammation so can walk on trip to [place].  Leaves this wed. .  [facility]. Follow-up by: Gladis Riffle, RN,  March 24, 2010 1:37 PM  Additional Follow-up for Phone Call Additional follow up Details #3:: Details for Additional Follow-up Action Taken: see Rx Additional Follow-up by: Birdie Sons MD,  March 24, 2010 4:42 PM  New/Updated Medications: PREDNISONE 20 MG TABS (PREDNISONE) 2 by mouth once daily for 3 days and then 1 by mouth once daily for 3 days and then 1/2 by mouth once daily for 3 days Prescriptions: PREDNISONE 20 MG TABS (PREDNISONE) 2 by mouth once daily for 3 days and then 1 by mouth once daily for 3 days and then 1/2 by mouth once daily for 3 days  #25 x 0   Entered and Authorized by:   Birdie Sons MD   Signed by:   Birdie Sons MD on 03/24/2010   Method used:   Electronically to        [facility]  [phone]* (retail)       [address]       [place], [place]  [address]       Ph: [phone] or [phone]       Fax: [phone]   RxID:   [id number]  Patient notified.

## 2010-12-30 NOTE — Progress Notes (Signed)
Summary: question re: knee pain  Phone Note Call from Patient Call back at Home Phone [phone]   Caller: Patient via vm Call For: Birdie Sons MD Summary of Call: Who should he see for constant knee pain that he cracks his knee for daytime for relief?  Pain awakens him at night also.  Asking if should see orthopedic surgeon or rheumatologist. Initial call taken by: Gladis Riffle, RN,  July 21, 2010 12:28 PM  Follow-up for Phone Call        orthopedic surgeon per dr swords.  Patient notified.  Follow-up by: Gladis Riffle, RN,  July 21, 2010 12:28 PM

## 2010-12-30 NOTE — Letter (Signed)
Summary: New Patient letter  [facility]  [address] [place], [place] [address]   Phone: [phone]  Fax: [phone]       02/17/2010 MRN: [id number]  [name] [address] [place], [place]  [address]  Dear Mr. VENEY,  Welcome to the Gastroenterology Division at [facility].    You are scheduled to see Dr.  Lina Sar on February 25, 2010 at 1:30pm on the 3rd floor at [facility], [address]  We ask that you try to arrive at our office 15 minutes prior to your appointment time to allow for check-in.  We would like you to complete the enclosed self-administered evaluation form prior to your visit and bring it with you on the day of your appointment.  We will review it with you.  Also, please bring a complete list of all your medications or, if you prefer, bring the medication bottles and we will list them.  Please bring your insurance card so that we may make a copy of it.  If your insurance requires a referral to see a specialist, please bring your referral form from your primary care physician.  Co-payments are due at the time of your visit and may be paid by cash, check or credit card.     Your office visit will consist of a consult with your physician (includes a physical exam), any laboratory testing he/she may order, scheduling of any necessary diagnostic testing (e.g. x-ray, ultrasound, CT-scan), and scheduling of a procedure (e.g. Endoscopy, Colonoscopy) if required.  Please allow enough time on your schedule to allow for any/all of these possibilities.    If you cannot keep your appointment, please call [phone] to cancel or reschedule prior to your appointment date.  This allows [place] the opportunity to schedule an appointment for another patient in need of care.  If you do not cancel or reschedule by 5 p.m. the business day prior to your appointment date, you will be charged a $50.00 late cancellation/no-show fee.    Thank you  for choosing [facility] for your medical needs.  We appreciate the opportunity to care for you.  Please visit [place] at our website  to learn more about our practice.                     Sincerely,                                                             The Gastroenterology Division

## 2010-12-30 NOTE — Miscellaneous (Signed)
Summary: Orders Update  Clinical Lists Changes  Orders: Added new Referral order of Orthopedic Referral (Ortho) - Signed 

## 2010-12-30 NOTE — Cardiovascular Report (Signed)
Summary: Office Visit   Office Visit   Imported By: Roderic Ovens 07/01/2010 15:25:23  _____________________________________________________________________  External Attachment:    Type:   Image     Comment:   External Document

## 2010-12-30 NOTE — Assessment & Plan Note (Signed)
Summary: chest disconfort, gerd, hemorroids...em    History of Present Illness Visit Type: Initial Visit Primary GI MD: Lina Sar MD Primary Provider: Birdie Sons MD Chief Complaint: Patient here to discuss colonosocpy and to discuss removal of his hemorrhoids at time of his colonoscopy.  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 He has a large external skin tag at the rectal orifice which seems to be difficult to clean and gets irritated from time to time. He has occasional chest pain consistent with gastroesophageal reflux. He is status post cholecystectomy. Patient has increased prostate and mild abnormalities of liver function tests. A colonoscopy done in July 2005 showed a small polyp which was removed with snare but the specimen showed only vegetable matter. Apparently, the specimen was not captured in the trap.   GI Review of Systems      Denies abdominal pain, acid reflux, belching, bloating, chest pain, dysphagia with liquids, dysphagia with solids, heartburn, loss of appetite, nausea, vomiting, vomiting blood, weight loss, and  weight gain.      Reports hemorrhoids.     Denies anal fissure, black tarry stools, change in bowel habit, constipation, diarrhea, diverticulosis, fecal incontinence, heme positive stool, irritable bowel syndrome, jaundice, light color stool, liver problems, rectal bleeding, and  rectal pain. Preventive Screening-Counseling & Management      Drug Use:  no.      Current Medications (verified): 1)  Aspirin Ec 325 Mg  Tbec (Aspirin) .... Once Daily 2)  Benazepril Hcl 5 Mg Tabs (Benazepril Hcl) .... One Q Day (Holding) 3)  Crestor 20 Mg Tabs (Rosuvastatin Calcium) .... 1/4 Tablet By Mouth Daily or As Directed 4)  Glucosamine Sulfate   Powd (Glucosamine Sulfate) .... Once Daily 5)  Multivitamins   Tabs (Multiple Vitamin) .... Once Daily 6)  Fish Oil 300 Mg Caps  (Omega-3 Fatty Acids) .... Once Daily 7)  Naprosyn 500 Mg Tabs (Naproxen) .... Take 1 Tablet By Mouth Two Times A Day With Food As Needed  Allergies (verified): No Known Drug Allergies  Past History:  Past Medical History: Reviewed history from 07/12/2007 and no changes required. Hypertension Cerebrovascular accident, hx of Ischemic Optic Neuropathy Hyperlipidemia Stroke Bradycardia  Past Surgical History: Reviewed history from 02/19/2010 and no changes required. Cardiac cath-1996 Cholecystectomy3/08/2003 Colonoscopy-06/23/2004 Pacemaker left eye surgery  Family History: Family hx MI No FH of Colon Cancer: Family History of Heart Disease: Mother, father Family History of Diabetes: paternal Grandfather, father hypertension: paternal grandfather  Social History: Former Smoker Married Occupation:[employer] Retired Alcohol Use - yes 1 per day Daily Caffeine Use 1 per day Illicit Drug Use - no Drug Use:  no  Review of Systems       The patient complains of allergy/sinus, arthritis/joint pain, and muscle pains/cramps.  The patient denies anemia, anxiety-new, back pain, blood in urine, breast changes/lumps, change in vision, confusion, cough, coughing up blood, depression-new, fainting, fatigue, fever, headaches-new, hearing problems, heart murmur, heart rhythm changes, itching, menstrual pain, night sweats, nosebleeds, pregnancy symptoms, shortness of breath, skin rash, sleeping problems, sore throat, swelling of feet/legs, swollen lymph glands, thirst - excessive , urination - excessive , urination changes/pain, urine leakage, vision changes, and voice change.         Pertinent positive and negative review of systems were noted in the above HPI. All other ROS was otherwise negative.   Vital Signs:  Patient profile:   65  year old male Height:      67.75 inches Weight:      161.6 pounds BMI:     24.84 Pulse rate:   64 / minute Pulse rhythm:    regular BP sitting:   122 / 72  (left arm) Cuff size:   regular  Vitals Entered By: Harlow Mares CMA Duncan Dull) (February 25, 2010 1:48 PM)  Physical Exam  General:  Well developed, well nourished, no acute distress. Mouth:  No deformity or lesions, dentition normal. Neck:  Supple; no masses or thyromegaly. Lungs:  Clear throughout to auscultation. Heart:  Regular rate and rhythm; no murmurs, rubs,  or bruits. Abdomen:  Soft, nontender and nondistended. No masses, hepatosplenomegaly or hernias noted. Normal bowel sounds,small left inguinal hernia. Normal liver edge at costal margin. Rectal:  large external hemorrhoidal tag. Normal rectal tone. Stool is Hemoccult negative. Prostate is 2+. Extremities:  No clubbing, cyanosis, edema or deformities noted. Skin:  Intact without significant lesions or rashes. Psych:  Alert and cooperative. Normal mood and affect.   Impression & Recommendations:  Problem # 1:  EXTERNAL HEMORRHOIDS (ICD-455.3) Patient has a large external hemorrhoidal tag which was present on his last colonoscopy in 2007 as well. He would like to have it removed and it ought to be done by  a Development worker, international aid. He would like to have it removed while having a left inguinal hernia repair but he was told by Dr Gerrit Friends that it would not be safe to do at the same time because all of the possibility of cross  contamination.  Orders: Colonoscopy (Colon)  Problem # 2:  POLYP, COLON (ICD-211.3) Patient had a polyp removed on a colonoscopy in July 2005 but it was not recovered. The specimen was mistaken for vegetable matter. He is due for a repeat colonoscopy now.  Orders: Colonoscopy (Colon)  Patient Instructions: 1)  recall colonoscopy for followup of colon polyp. 2)  Procedure for left inguinal hernia repair after colonoscopy. 3)  Discuss removal of external hemorrhoidal tag with Dr Gerrit Friends. 4)  Copy sent to :Dr B.Swords, Dr Gerrit Friends  5)  The medication list was reviewed and reconciled.   All changed / newly prescribed medications were explained.  A complete medication list was provided to the patient / caregiver. Prescriptions: DULCOLAX 5 MG  TBEC (BISACODYL) Day before procedure take 2 at 3pm and 2 at 8pm.  #4 x 0   Entered by:   Hortense Ramal CMA (AAMA)   Authorized by:   Hart Carwin MD   Signed by:   Hortense Ramal CMA (AAMA) on 02/25/2010   Method used:   Electronically to        [facility]  [phone]* (retail)       [address]       [place], [place]  [address]       Ph: [phone] or [phone]       Fax: [phone]   RxID:   [id number] REGLAN 10 MG  TABS (METOCLOPRAMIDE HCL) As per prep instructions.  #2 x 0   Entered by:   Hortense Ramal CMA (AAMA)   Authorized by:   Hart Carwin MD   Signed by:   Hortense Ramal CMA (AAMA) on 02/25/2010   Method used:   Electronically to        [facility]  [phone]* (retail)       [phone] [facility] [address]  [place], [place]  [address]       Ph: [phone] or [phone]       Fax: [phone]   RxID:   [id number] MIRALAX   POWD (POLYETHYLENE GLYCOL 3350) As per prep  instructions.  #255gm x 0   Entered by:   Hortense Ramal CMA (AAMA)   Authorized by:   Hart Carwin MD   Signed by:   Hortense Ramal CMA (AAMA) on 02/25/2010   Method used:   Electronically to        [facility]  [phone]* (retail)       [address]       [place], [place]  [address]       Ph: [phone] or [phone]       Fax: [phone]   RxID:   [id number]

## 2010-12-30 NOTE — Letter (Signed)
Summary: [facility]   Imported By: Maryln Gottron 02/26/2010 12:38:13  _____________________________________________________________________  External Attachment:    Type:   Image     Comment:   External Document

## 2010-12-30 NOTE — Assessment & Plan Note (Signed)
Summary: Chest & arm discomfort/ns/cb   Vital Signs:  Patient profile:   65 year old male Pulse rate:   60 / minute Pulse rhythm:   regular Resp:     12 per minute BP sitting:   122 / 60  (left arm) Cuff size:   regular  Vitals Entered By: Gladis Riffle, RN (March 17, 2010 11:47 AM) CC: c/o pain from under sternum up chest and down arms, plus goes into neck--having increased frequency up to 4x per day and lasts 10 minutes Is Patient Diabetic? No   Primary Care Provider:  Birdie Sons MD  CC:  c/o pain from under sternum up chest and down arms and plus goes into neck--having increased frequency up to 4x per day and lasts 10 minutes.  History of Present Illness:       This is a 65 year old male who presents with Chest pain.  The patient reports resting chest pain and indigestion, but denies exertional chest pain, nausea, vomiting, diaphoresis, shortness of breath, and palpitations.  The pain is described as intermittent.  The pain is located in the left anterior chest.  The pain radiates to the left arm.  Episodes of chest pain last 1-2 minutes.  The pain is brought on or made worse by lying down.   All other systems reviewed and were negative   Preventive Screening-Counseling & Management  Alcohol-Tobacco     Smoking Status: quit > 6 months     Year Started: 1070     Year Quit: 1990  Current Medications (verified): 1)  Aspirin Ec 325 Mg  Tbec (Aspirin) .... Once Daily 2)  Benazepril Hcl 5 Mg Tabs (Benazepril Hcl) .... One Q Day (Holding) 3)  Crestor 20 Mg Tabs (Rosuvastatin Calcium) .... 1/4 Tablet By Mouth Daily or As Directed 4)  Glucosamine Sulfate   Powd (Glucosamine Sulfate) .... Once Daily 5)  Multivitamins   Tabs (Multiple Vitamin) .... Once Daily 6)  Fish Oil 300 Mg Caps (Omega-3 Fatty Acids) .... Once Daily 7)  Naprosyn 500 Mg Tabs (Naproxen) .... Take 1 Tablet By Mouth Two Times A Day With Food As Needed 8)  Miralax   Powd (Polyethylene Glycol 3350) .... As Per Prep   Instructions. 9)  Reglan 10 Mg  Tabs (Metoclopramide Hcl) .... As Per Prep Instructions. 10)  Dulcolax 5 Mg  Tbec (Bisacodyl) .... Day Before Procedure Take 2 At 3pm and 2 At 8pm.  Allergies (verified): No Known Drug Allergies  Past History:  Past Medical History: Last updated: 07/12/2007 Hypertension Cerebrovascular accident, hx of Ischemic Optic Neuropathy Hyperlipidemia Stroke Bradycardia  Past Surgical History: Last updated: 02/19/2010 Cardiac cath-1996 Cholecystectomy3/08/2003 Colonoscopy-06/23/2004 Pacemaker left eye surgery  Family History: Last updated: 02/25/2010 Family hx MI No FH of Colon Cancer: Family History of Heart Disease: Mother, father Family History of Diabetes: paternal Emelia Loron, father hypertension: paternal grandfather  Social History: Last updated: 02/25/2010 Former Smoker Married Occupation:[employer] Ret[employer] Alcohol Use - yes 1 per day Daily Caffeine Use 1 per day Illicit Drug Use - no  Risk Factors: Smoking Status: quit > 6 months (03/17/2010)  Physical Exam  General:  Well-developed,well-nourished,in no acute distress; alert,appropriate and cooperative throughout examination Head:  normocephalic and atraumatic.   Eyes:  pupils equal and pupils round.   Ears:  R ear normal and L ear normal.   Neck:  No deformities, masses, or tenderness noted. Chest Wall:  No deformities, masses, tenderness or gynecomastia noted. Lungs:  normal respiratory effort and no  intercostal retractions.   Heart:  normal rate and regular rhythm.   Abdomen:  soft and non-tender.   Msk:  full range of motion of both hips. Neurologic:  cranial nerves II-XII intact and gait normal.     Impression & Recommendations:  Problem # 1:  GERD (ICD-530.81)  PPI side effects dicussed  His updated medication list for this problem includes:    Aciphex 20 Mg Tbec (Rabeprazole sodium) .Marland Kitchen... Take 1 tab every morning  Problem # 2:  CHEST PAIN  (ICD-786.50)  suspect GERD schedule nuclear stress test--cancel if sxs resolved with PPI  Orders: Cardiolite (Cardiolite)  Complete Medication List: 1)  Aspirin Ec 325 Mg Tbec (Aspirin) .... Once daily 2)  Benazepril Hcl 5 Mg Tabs (Benazepril hcl) .... One q day (holding) 3)  Crestor 20 Mg Tabs (Rosuvastatin calcium) .... 1/4 tablet by mouth daily or as directed 4)  Glucosamine Sulfate Powd (Glucosamine sulfate) .... Once daily 5)  Multivitamins Tabs (Multiple vitamin) .... Once daily 6)  Fish Oil 300 Mg Caps (Omega-3 fatty acids) .... Once daily 7)  Naprosyn 500 Mg Tabs (Naproxen) .... Take 1 tablet by mouth two times a day with food as needed 8)  Aciphex 20 Mg Tbec (Rabeprazole sodium) .... Take 1 tab every morning 9)  Prednisone 20 Mg Tabs (Prednisone) .... 2 by mouth once daily for 3 days and then 1 by mouth once daily for 3 days and then 1/2 by mouth once daily for 3 days Prescriptions: ACIPHEX 20 MG TBEC (RABEPRAZOLE SODIUM) Take 1 tab every morning  #90 x 3   Entered and Authorized by:   Birdie Sons MD   Signed by:   Birdie Sons MD on 03/17/2010   Method used:   Samples Given   RxID:   [id number]

## 2010-12-30 NOTE — Assessment & Plan Note (Signed)
Summary: F/U ON LABWRK // RS   Vital Signs:  Patient profile:   65 year old male Weight:      157 pounds Temp:     98.3 degrees F oral BP sitting:   110 / 72  (left arm) Cuff size:   regular  Vitals Entered By: Sid Falcon LPN (September 01, 2010 11:46 AM)  Serial Vital Signs/Assessments:  Time      Position  BP       Pulse  Resp  Temp     By 11:55 AM            110/72                         Sid Falcon LPN   Primary Care Provider:  Birdie Sons MD   History of Present Illness:  Follow-Up Visit      This is a 65 year old man who presents for Follow-up visit.  The patient denies chest pain and palpitations.  Since the last visit the patient notes no new problems or concerns, except for back and right leg pain. I discussed this with him at the time of his lab draw. CT Scan was ordered because he is unable to get MRI (pacemaker)..  The patient reports taking meds as prescribed.  When questioned about possible medication side effects, the patient notes none.    All other systems reviewed and were negative   Allergies (verified): No Known Drug Allergies  Past History:  Past Medical History: Last updated: 07/12/2007 Hypertension Cerebrovascular accident, hx of Ischemic Optic Neuropathy Hyperlipidemia Stroke Bradycardia  Past Surgical History: Last updated: 02/19/2010 Cardiac cath-1996 Cholecystectomy3/08/2003 Colonoscopy-06/23/2004 Pacemaker left eye surgery  Family History: Last updated: 02/25/2010 Family hx MI No FH of Colon Cancer: Family History of Heart Disease: Mother, father Family History of Diabetes: paternal Emelia Loron, father hypertension: paternal grandfather  Social History: Last updated: 02/25/2010 Former Smoker Married Occupation:[employer] Retired Alcohol Use - yes 1 per day Daily Caffeine Use 1 per day Illicit Drug Use - no  Risk Factors: Smoking Status: quit > 6 months (03/17/2010)  Physical Exam  General:   well-developed well-nourished male in no acute distress. HEENT exam atraumatic, normocephalic a metrics muscles are intact. Neck is supple without lymphadenopathy or thyromegaly. Chest clear to auscultation. Cardiac exam S1 and S2 are regular. Abdominal exam active bowel sounds, soft and nontender. Extremities with no clubbing cyanosis or edema. Musculoskeletal: he has full range of motion of his hips and knees. Straight leg raise is negative.   Impression & Recommendations:  Problem # 1:  SPINAL STENOSIS, LUMBAR (ICD-724.02) discussed reviewed imaging with patient Personal and are arranged neurosurgery evaluation while the patient was in the office.  Problem # 2:  HYPERLIPIDEMIA (ICD-272.4) controlled continue current medications  His updated medication list for this problem includes:    Crestor 20 Mg Tabs (Rosuvastatin calcium) .Marland Kitchen... 1/4 tablet by mouth daily or as directed  Labs Reviewed: SGOT: 18 (08/22/2010)   SGPT: 19 (08/22/2010)   HDL:42.80 (08/22/2010), 43.40 (11/05/2009)  LDL:107 (08/22/2010), 90 (19/14/7829)  Chol:190 (08/22/2010), 201 (11/05/2009)  Trig:199.0 (08/22/2010), 221.0 (11/05/2009)  Problem # 3:  HYPERTENSION (ICD-401.9) controlled  His updated medication list for this problem includes:    Benazepril Hcl 5 Mg Tabs (Benazepril hcl) ..... One q day  BP today: 110/72 Prior BP: 122/60 (03/17/2010)  Labs Reviewed: K+: 4.2 (08/22/2010) Creat: : 1.3 (08/22/2010)   Chol: 190 (08/22/2010)   HDL:  42.80 (08/22/2010)   LDL: 107 (08/22/2010)   TG: 199.0 (08/22/2010)  Complete Medication List: 1)  Aspirin Ec 325 Mg Tbec (Aspirin) .... Once daily 2)  Benazepril Hcl 5 Mg Tabs (Benazepril hcl) .... One q day 3)  Crestor 20 Mg Tabs (Rosuvastatin calcium) .... 1/4 tablet by mouth daily or as directed 4)  Glucosamine Sulfate Powd (Glucosamine sulfate) .... Once daily 5)  Multivitamins Tabs (Multiple vitamin) .... Once daily 6)  Fish Oil 300 Mg Caps (Omega-3 fatty acids) ....  Once daily 7)  Hydrocodone-acetaminophen 5-325 Mg Tabs (Hydrocodone-acetaminophen) .Marland Kitchen.. 1 by mouth up to 4 times per day as needed for pain 8)  Zolpidem Tartrate 5 Mg Tabs (Zolpidem tartrate) .Marland Kitchen.. 1 by mouth at night as needed 9)  Indomethacin 25 Mg Caps (Indomethacin) .... 2 by mouth two times a day

## 2010-12-30 NOTE — Letter (Signed)
Summary: [facility] Clear[facility]   Imported By: Roderic Ovens 02/10/2010 15:47:24  _____________________________________________________________________  External Attachment:    Type:   Image     Comment:   External Document

## 2010-12-30 NOTE — Progress Notes (Signed)
Summary: Refill--Hydrocodone  Phone Note Refill Request Call back at Home Phone [phone] Message from:  Spouse on August 08, 2010 9:40 AM  Refills Requested: Medication #1:  HYDROCODONE-ACETAMINOPHEN 5-325 MG TABS 1 by mouth up to 4 times per day as needed for pain Requests 60 instead of 30. Please advise.  Initial call taken by: Lucious Groves CMA,  August 08, 2010 9:41 AM  Follow-up for Phone Call        ok x one Follow-up by: Birdie Sons MD,  August 08, 2010 11:46 AM  Additional Follow-up for Phone Call Additional follow up Details #1::        Rx called to pharmacy Additional Follow-up by: Kern Reap CMA Duncan Dull),  August 11, 2010 11:12 AM

## 2010-12-30 NOTE — Progress Notes (Signed)
Summary: Rheumatology referral  Phone Note Call from Patient   Caller: Patient Call For: Birdie Sons MD Reason for Call: Acute Illness Summary of Call: Pt would like Dr. Cato Mulligan to recommend a Rheumatologist. [phone] Initial call taken by: Lynann Beaver CMA,  May 29, 2010 10:03 AM  Follow-up for Phone Call        dr truslow Follow-up by: Birdie Sons MD,  May 31, 2010 6:27 PM  New Problems: GEN OSTEOARTHROSIS INVOLVING MULTIPLE SITES (ICD-715.09) HIP PAIN (ICD-719.45)   New Problems: GEN OSTEOARTHROSIS INVOLVING MULTIPLE SITES (ICD-715.09) HIP PAIN (ICD-719.45)

## 2010-12-30 NOTE — Assessment & Plan Note (Signed)
Summary: CONSULT RE: DISCOMFORT IN GROIN/?BLADDER/CJR   Vital Signs:  Patient profile:   65 year old male Weight:      162 pounds Temp:     97.8 degrees F oral BP sitting:   120 / 80  (left arm) Cuff size:   regular  Vitals Entered By: Kern Reap CMA Duncan Dull) (February 17, 2010 11:19 AM) CC: groin and lower abdominal pain, right leg pain Is Patient Diabetic? No Pain Assessment Patient in pain? yes     Location: groin area   Primary Care Provider:  Birdie Sons MD  CC:  groin and lower abdominal pain and right leg pain.  History of Present Illness: Chest pain: a pressure sensation at night that can be associated with a "bad taste" in my mouth. No typical GERD like sxs.   Describes suprapubic discomfort and a testicular pressure---has a known hernia (right) intermittent for months---initially improved with naprosyn  Knees can be painful---    Allergies: No Known Drug Allergies  Past History:  Past Medical History: Last updated: 07/12/2007 Hypertension Cerebrovascular accident, hx of Ischemic Optic Neuropathy Hyperlipidemia Stroke Bradycardia  Past Surgical History: Last updated: 07/12/2007 Cardiac cath-1996 Cholecystectomy3/08/2003 Colonoscopy-06/23/2004 Pacemaker  Family History: Last updated: 06/02/2007 Family hx MI  Social History: Last updated: 03/01/2008 Former Smoker Married Occupation:[employer]-retired Retired  Risk Factors: Smoking Status: quit > 6 months (11/11/2009)  Physical Exam  General:  Well-developed,well-nourished,in no acute distress; alert,appropriate and cooperative throughout examination Head:  normocephalic and atraumatic.   Eyes:  pupils equal and pupils round.   Ears:  R ear normal and L ear normal.   Msk:  full range of motion of both hips. Extremities:  No clubbing, cyanosis, edema, or deformity noted  Neurologic:  deep tendon reflexes are intact.  Strength is normal.   Impression &  Recommendations:  Problem # 1:  LEG PAIN (ICD-729.5) unclear etiology.  He has some diffuse symptoms.  I think it's worth screening for a connective tissue disease although I doubt he has that.  His description of pain located the discomfort to the hip however he has normal range of motion.  I think is reasonable to check an x-ray. Orders: Venipuncture (85462) T-Antinuclear Antib (ANA) (70350-09381) TLB-Sedimentation Rate (ESR) (85652-ESR) TLB-Rheumatoid Factor (RA) (82993-ZJ)  Problem # 2:  GOUT (ICD-274.9) no recurrence  Complete Medication List: 1)  Aspirin Ec 325 Mg Tbec (Aspirin) .... Once daily 2)  Benazepril Hcl 5 Mg Tabs (Benazepril hcl) .... One q day 3)  Crestor 20 Mg Tabs (Rosuvastatin calcium) .... 1/4 tablet by mouth daily or as directed 4)  Glucosamine Sulfate Powd (Glucosamine sulfate) .... Once daily 5)  Multivitamins Tabs (Multiple vitamin) .... Once daily 6)  Fish Oil 300 Mg Caps (Omega-3 fatty acids) .... Once daily 7)  Naprosyn 500 Mg Tabs (Naproxen) .... Take 1 tablet by mouth two times a day with food as needed  Other Orders: UA Dipstick w/o Micro (automated)  (81003) Gastroenterology Referral (GI) T-Bilateral Hip w/Pelvis, min 2 views (73520TC)  Laboratory Results   Urine Tests    Routine Urinalysis   Color: yellow Appearance: Clear Glucose: negative   (Normal Range: Negative) Bilirubin: negative   (Normal Range: Negative) Ketone: negative   (Normal Range: Negative) Spec. Gravity: <1.005   (Normal Range: 1.003-1.035) Blood: negative   (Normal Range: Negative) pH: 5.5   (Normal Range: 5.0-8.0) Protein: negative   (Normal Range: Negative) Urobilinogen: 0.2   (Normal Range: 0-1) Nitrite: negative   (Normal Range: Negative) Leukocyte Esterace:  negative   (Normal Range: Negative)    Comments: Rita Ohara  February 17, 2010 11:32 AM

## 2010-12-30 NOTE — Progress Notes (Signed)
Summary: stiff neck  Phone Note Call from Patient Call back at Home Phone [phone]   Caller: Spouse Call For: swords Summary of Call: pt wants a refill on cyclobenzaprine 10mg  but its not on med list.  He is having a stiff neck Initial call taken by: Alfred Levins, CMA,  October 06, 2010 11:10 AM  Follow-up for Phone Call        cyclobenzaprine 10 mg p.o. b.i.d. p.r.n. neck stiffness. #30/1 Follow-up by: Birdie Sons MD,  October 06, 2010 11:59 AM  Additional Follow-up for Phone Call Additional follow up Details #1::        Phone Call Completed, Rx Called In Additional Follow-up by: Alfred Levins, CMA,  October 06, 2010 1:33 PM    New/Updated Medications: CYCLOBENZAPRINE HCL 10 MG  TABS (CYCLOBENZAPRINE HCL) 1 by mouth two times a day as needed for stiff neck Prescriptions: CYCLOBENZAPRINE HCL 10 MG  TABS (CYCLOBENZAPRINE HCL) 1 by mouth two times a day as needed for stiff neck  #30 x 1   Entered by:   Alfred Levins, CMA   Authorized by:   Birdie Sons MD   Signed by:   Alfred Levins, CMA on 10/06/2010   Method used:   Electronically to        [facility]  [phone]* (retail)       [address]       [place], [place]  [address]       Ph: [phone] or [phone]       Fax: [phone]   RxID:   [id number]

## 2010-12-30 NOTE — Progress Notes (Signed)
Summary: new med requests  Phone Note Call from Patient Call back at Home Phone [phone]   Caller: Patient via VM Call For: Birdie Sons MD Summary of Call:   Almost out of percocet and it makes him jittery.  Has tried excedrin PM and also makes him jittery.  In pain from knee so not sleeping.  Requests Rx vicodin and ambien to [facility] Initial call taken by: Gladis Riffle, RN,  June 06, 2010 9:42 AM  Follow-up for Phone Call        see rx Follow-up by: Birdie Sons MD,  June 06, 2010 11:06 AM  Additional Follow-up for Phone Call Additional follow up Details #1::        Rx telephoned to [facility].  wife aware. Additional Follow-up by: Gladis Riffle, RN,  June 06, 2010 11:46 AM    New/Updated Medications: HYDROCODONE-ACETAMINOPHEN 5-325 MG TABS (HYDROCODONE-ACETAMINOPHEN) 1 by mouth up to 4 times per day as needed for pain ZOLPIDEM TARTRATE 5 MG  TABS (ZOLPIDEM TARTRATE) 1 by mouth at night as needed Prescriptions: ZOLPIDEM TARTRATE 5 MG  TABS (ZOLPIDEM TARTRATE) 1 by mouth at night as needed  #15 x 1   Entered and Authorized by:   Birdie Sons MD   Signed by:   Birdie Sons MD on 06/06/2010   Method used:   Telephoned to ...       [facility]  [address]  [phone]* (retail)       [address]       [place], [place]  [address]       Ph: [phone] or [phone]       Fax: [phone]   RxID:   [id number] HYDROCODONE-ACETAMINOPHEN 5-325 MG TABS (HYDROCODONE-ACETAMINOPHEN) 1 by mouth up to 4 times per day as needed for pain  #30 x 1   Entered and Authorized by:   Birdie Sons MD   Signed by:   Birdie Sons MD on 06/06/2010   Method used:   Telephoned to ...       [facility]  [address]  [phone]* (retail)       [address]       [place], [place]  [address]       Ph: [phone] or [phone]       Fax: [phone]   RxID:   [id number]  telephoned to [facility].

## 2010-12-30 NOTE — Progress Notes (Signed)
Summary: cardiac clearance   Phone Note From Other Clinic   Caller: Dr Rush Farmer Summary of Call: Dr Ladona Ridgel recieved call from Dr Rush Farmer requesting cardiac clearance for back surgery.  Per Dr Ladona Ridgel, patient at acceptable cardiac risk for surgery.  Dr Rush Farmer aware.  Asked schedulers to make appointment with Dr Ladona Ridgel in next month or so to evaluate pacemaker battery status. Gypsy Balsam RN BSN  September 04, 2010 2:42 PM

## 2010-12-30 NOTE — Progress Notes (Signed)
Summary: pt has questions and concerns about symptoms he is having   Phone Note Call from Patient Call back at Home Phone [phone]   Caller: Patient Reason for Call: Talk to Nurse, Talk to Doctor Summary of Call: pt is hurting from shoulders up to his neck it starts at abdomen and up to the chest he had that feeling about and hour last night but not right now and he wants to rule out anything cardiac. He also has a funny taste in his mouth. He wants a call back today. Initial call taken by: Omer Jack,  March 06, 2010 1:44 PM  Follow-up for Phone Call        Pt. states he has had symptoms for past couple of weeks of flushing and tightening that begins in abdomen and goes up back, chest and into neck.  Also reports feeling of "saliva secretion" in mouth. Had colonoscopy earlier this week and is having belching and gas.  Pt able to exercise without problems. No SOB or nausea asscociated with this.  States he has seen Dr. Cato Mulligan for this and  it was felt to be reflux related.  I instructed pt to be evaluated by Dr. Cato Mulligan for this as he has been seeing him for this.  I instructed pt that he should go to ER if symptoms were to change or increase in severity.

## 2010-12-30 NOTE — Progress Notes (Signed)
Summary: Pt is req to get some blood work done with follow up after  Phone Note Call from Patient Call back at [facility] Phone [phone]   Caller: Patient Summary of Call: Pt called and would like to sch an ov appt with labs one week prior to visit, as he has done in the past. What labs done we need to sch? Pls advise.  Initial call taken by: Lucy Antigua,  August 19, 2010 12:21 PM  Follow-up for Phone Call        lipids 272.4 liver 995.2 bmet cbc diff sed rate Follow-up by: Birdie Sons MD,  August 19, 2010 4:02 PM  Additional Follow-up for Phone Call Additional follow up Details #1::        Called and lft msg with pts wife req that pt c/b to schedule appt for labs  (lipids 272.4, liver 995.2, bmet, cbc w/diff, sed rate)  and f/u OV with Dr Cato Mulligan.  Additional Follow-up by: Debbra Riding,  August 20, 2010 10:16 AM    Additional Follow-up for Phone Call Additional follow up Details #2::    Attempted to contact pt so that appt could be scheduled for labwork / f/u OV..... LMTCB .... Debbra Riding  August 20, 2010 4:24 PM  Follow-up by: Debbra Riding,  August 20, 2010 4:24 PM  Additional Follow-up for Phone Call Additional follow up Details #3:: Details for Additional Follow-up Action Taken: Pt called in and was scheduled for labwork ((lipids 272.4, liver 995.2, bmet, cbc w/diff, sed rate) on 9/23.... f/u ov w/ Dr Cato Mulligan on  10/3.  Additional Follow-up by: Debbra Riding,  August 21, 2010 8:27 AM

## 2010-12-30 NOTE — Letter (Signed)
Summary: [facility] Instructions  [facility]  [address] [place], [place] [address]   Phone: [phone]  Fax: [phone]       [name]    December 01, 1945    MRN: [id number]       Procedure Day /Date: 03/04/10 Tuesday     Arrival Time: 1:00 pm     Procedure Time: 2:00 pm     Location of Procedure:                    _x _  [facility] (4th Floor)  PREPARATION FOR COLONOSCOPY WITH MIRALAX  Starting 5 days prior to your procedure 02/27/10 do not eat nuts, seeds, popcorn, corn, beans, peas,  salads, or any raw vegetables.  Do not take any fiber supplements (e.g. Metamucil, Citrucel, and Benefiber). ____________________________________________________________________________________________________   THE DAY BEFORE YOUR PROCEDURE         DATE: 03/03/10 DAY: Monday  1   Drink clear liquids the entire day-NO SOLID FOOD  2   Do not drink anything colored red or purple.  Avoid juices with pulp.  No orange juice.  3   Drink at least 64 oz. (8 glasses) of fluid/clear liquids during the day to prevent dehydration and help the prep work efficiently.  CLEAR LIQUIDS INCLUDE: Water Jello Ice Popsicles Tea (sugar ok, no milk/cream) Powdered fruit flavored drinks Coffee (sugar ok, no milk/cream) Gatorade Juice: apple, white grape, white cranberry  Lemonade Clear bullion, consomm, broth Carbonated beverages (any kind) Strained chicken noodle soup Hard Candy  4   Mix the entire bottle of Miralax with 64 oz. of Gatorade/Powerade in the morning and put in the refrigerator to chill.  5   At 3:00 pm take 2 Dulcolax/Bisacodyl tablets.  6   At 4:30 pm take one Reglan/Metoclopramide tablet.  7  Starting at 5:00 pm drink one 8 oz glass of the Miralax mixture every 15-20 minutes until you have finished drinking the entire 64 oz.  You should finish drinking prep around 7:30 or 8:00 pm.  8   If you are nauseated, you may take the 2nd Reglan/Metoclopramide tablet  at 6:30 pm.        9    At 8:00 pm take 2 more DULCOLAX/Bisacodyl tablets.        THE DAY OF YOUR PROCEDURE      DATE:  03/04/10 DAY: Tuesday  You may drink clear liquids until 12:00 pm (2 HOURS BEFORE PROCEDURE).   MEDICATION INSTRUCTIONS  Unless otherwise instructed, you should take regular prescription medications with a small sip of water as early as possible the morning of your procedure.       OTHER INSTRUCTIONS  You will need a responsible adult at least 65 years of age to accompany you and drive you home.   This person must remain in the waiting room during your procedure.  Wear loose fitting clothing that is easily removed.  Leave jewelry and other valuables at home.  However, you may wish to bring a book to read or an iPod/MP3 player to listen to music as you wait for your procedure to start.  Remove all body piercing jewelry and leave at home.  Total time from sign-in until discharge is approximately 2-3 hours.  You should go home directly after your procedure and rest.  You can resume normal activities the day after your procedure.  The day of your procedure you should not:   Drive   Make legal decisions  Operate machinery   Drink alcohol   Return to work  You will receive specific instructions about eating, activities and medications before you leave.   The above instructions have been reviewed and explained to me by   Hortense Ramal CMA Duncan Dull)  February 25, 2010 2:59 PM     I fully understand and can verbalize these instructions _____________________________ Date 02/25/10

## 2010-12-30 NOTE — Procedures (Signed)
Summary: Colonoscopy  Patient: [name] Note: All result statuses are Final unless otherwise noted.  Tests: (1) Colonoscopy (COL)   COL Colonoscopy           DONE     [facility]     [address]     [place], [place]  [address]           COLONOSCOPY PROCEDURE REPORT           PATIENT:  [name], [name]  MR#:  [id number]     BIRTHDATE:  1946/04/21, 63 yrs. old  GENDER:  male     ENDOSCOPIST:  Hedwig Morton. Juanda Chance, MD     REF. BY:  Birdie Sons, M.D.     PROCEDURE DATE:  03/04/2010     PROCEDURE:  Colonoscopy 562[address]     ASA CLASS:  Class I     INDICATIONS:  Elevated Risk Screening large skin tag prolapsing     through the rectum,     hx of polypectomy 2005- not recovered     MEDICATIONS:   Versed 7 mg, Fentanyl 62.5 mcg           DESCRIPTION OF PROCEDURE:   After the risks benefits and     alternatives of the procedure were thoroughly explained, informed     consent was obtained.  Digital rectal exam was performed and     revealed no rectal masses.   The LB [id number] endoscope     was introduced through the anus and advanced to the cecum, which     was identified by both the appendix and ileocecal valve, without     limitations.  The quality of the prep was good, using MoviPrep.     The instrument was then slowly withdrawn as the colon was fully     examined.     <<PROCEDUREIMAGES>>           FINDINGS:  External hemorrhoids were found (see image6, image4,     image3, and image5). ext hemorrhoidal tag protruding through the     rectum, no active bleeding  This was otherwise a normal     examination of the colon (see image1 and image2).   Retroflexed     views in the rectum revealed no abnormalities.    The scope was     then withdrawn from the patient and the procedure completed.           COMPLICATIONS:  None     ENDOSCOPIC IMPRESSION:     1) External hemorrhoids     2) Otherwise normal examination     prolapsing skin tag, pt interested in having it  excised, will     see DR gerkin     RECOMMENDATIONS:     1) high fiber diet     f/upr gerkin to discuss removal os an external skin tag     REPEAT EXAM:  In 10 year(s) for.           ______________________________     Hedwig Morton. Juanda Chance, MD           CC:           n.     eSIGNED:   Hedwig Morton.  at 03/04/2010 02:28 PM           Teena Irani, [id number]  Note: An exclamation mark (!) indicates a result that was not dispersed into the flowsheet. Document Creation Date: 03/04/2010 2:28 PM _______________________________________________________________________  (1) Order  result status: Final Collection or observation date-time: 03/04/2010 14:20 Requested date-time:  Receipt date-time:  Reported date-time:  Referring Physician:   Ordering Physician: Lina Sar [id number]) Specimen Source:  Source: Launa Grill Order Number: [id number] Lab site:   Appended Document: Colonoscopy    Clinical Lists Changes  Observations: Added new observation of COLONNXTDUE: 02/2020 (03/04/2010 16:09)

## 2010-12-30 NOTE — Progress Notes (Signed)
Summary: FYI from radiologist  Phone Note From Other Clinic   Caller: Dr Melany Guernsey radiology--live Call For: swords Summary of Call: Pt hip pain somewhat "weird" and intense after hip injection so had pt return  and did aspiration.  Fluid came back fine with inflammatory cells, but no infection .  Pt described pain as from hip radiating to groin.  If continues after trip, would suggest look at lumbar spine as hip pain can be from back and vice versa. Initial call taken by: Gladis Riffle, RN,  March 25, 2010 3:58 PM

## 2010-12-30 NOTE — Letter (Signed)
Summary: [facility]  [facility]   Imported By: Maryln Gottron 01/27/2010 15:13:54  _____________________________________________________________________  External Attachment:    Type:   Image     Comment:   External Document

## 2010-12-30 NOTE — Progress Notes (Signed)
Summary: samples of crestor  Phone Note Call from Patient Call back at Home Phone [phone]   Caller: Patient Summary of Call: Pt would like some samples of crestor.  Initial call taken by: Romualdo Bolk, CMA Duncan Dull),  August 21, 2010 1:33 PM  Follow-up for Phone Call        ok Follow-up by: Birdie Sons MD,  August 21, 2010 2:40 PM  Additional Follow-up for Phone Call Additional follow up Details #1::        Pt aware that samples are ready to pick up. Additional Follow-up by: Romualdo Bolk, CMA Duncan Dull),  August 21, 2010 2:52 PM

## 2010-12-30 NOTE — Consult Note (Signed)
Summary: [facility]  [facility]   Imported By: Maryln Gottron 01/14/2010 14:46:57  _____________________________________________________________________  External Attachment:    Type:   Image     Comment:   External Document

## 2010-12-30 NOTE — Letter (Signed)
Summary: [facility]   Imported By: Maryln Gottron 07/03/2010 12:51:30  _____________________________________________________________________  External Attachment:    Type:   Image     Comment:   External Document

## 2010-12-30 NOTE — Progress Notes (Signed)
Summary: questions  Phone Note Call from Patient Call back at Home Phone [phone]   Caller: Patient  VM Call For: Birdie Sons MD Summary of Call: States has emailed you concerning BP med and steroid injection of hip.  Wants both resolved.  Would like answer via email or phone. Initial call taken by: Gladis Riffle, RN,  March 11, 2010 7:32 AM  Follow-up for Phone Call        see orders Follow-up by: Birdie Sons MD,  March 11, 2010 7:58 AM  Additional Follow-up for Phone Call Additional follow up Details #1::        Patient wife notified of order for hip injection.  Pt will call back if other questions.Gladis Riffle, RN  March 11, 2010 9:20 AM

## 2010-12-30 NOTE — Assessment & Plan Note (Signed)
Summary: eval battery/mt  Medications Added FISH OIL   OIL (FISH OIL) daily      Allergies Added: NKDA  Visit Type:  Follow-up Primary Provider:  Birdie Sons MD   History of Present Illness: [name] returns today for followup.  He is a 65 yo man with a h/o HTN and sinus bradycardia and severe arthritis.  He is considering lower back surgery.  He had a PPM placed years ago.  He is approaching ERI.  He is interested in his options for physiologic pacing as well as MRI compatabile pacing.  Current Medications (verified): 1)  Aspirin Ec 325 Mg  Tbec (Aspirin) .... Once Daily 2)  Benazepril Hcl 5 Mg Tabs (Benazepril Hcl) .... One Q Day 3)  Crestor 20 Mg Tabs (Rosuvastatin Calcium) .... 1/4 Tablet By Mouth Daily or As Directed 4)  Glucosamine Sulfate   Powd (Glucosamine Sulfate) .... Once Daily 5)  Multivitamins   Tabs (Multiple Vitamin) .... Once Daily 6)  Fish Oil   Oil (Fish Oil) .... Daily 7)  Hydrocodone-Acetaminophen 5-325 Mg Tabs (Hydrocodone-Acetaminophen) .Marland Kitchen.. 1 By Mouth Up To 4 Times Per Day As Needed For Pain 8)  Zolpidem Tartrate 5 Mg  Tabs (Zolpidem Tartrate) .Marland Kitchen.. 1 By Mouth At Night As Needed 9)  Indomethacin 25 Mg Caps (Indomethacin) .... 2 By Mouth Two Times A Day  Allergies (verified): No Known Drug Allergies  Past History:  Past Medical History: Last updated: 07/12/2007 Hypertension Cerebrovascular accident, hx of Ischemic Optic Neuropathy Hyperlipidemia Stroke Bradycardia  Past Surgical History: Last updated: 02/19/2010 Cardiac cath-1996 Cholecystectomy3/08/2003 Colonoscopy-06/23/2004 Pacemaker left eye surgery  Review of Systems  The patient denies chest pain, syncope, dyspnea on exertion, and peripheral edema.    Vital Signs:  Patient profile:   65 year old male Height:      67.75 inches Weight:      160 pounds BMI:     24.60 Pulse rate:   86 / minute BP sitting:   140 / 80  (right arm)  Vitals Entered By: [name] CMA (September 29, 2010 3:21 PM)  Physical Exam  General:  well-developed well-nourished male in no acute distress. HEENT exam atraumatic, normocephalic a metrics muscles are intact. Neck is supple without lymphadenopathy or thyromegaly. Chest clear to auscultation. Cardiac exam S1 and S2 are regular. Abdominal exam active bowel sounds, soft and nontender. Extremities with no clubbing cyanosis or edema. Musculoskeletal: he has full range of motion of his hips and knees. Straight leg raise is negative.   PPM Specifications Following MD:  Nathan Bunting, MD     PPM Vendor:  Medtronic     PPM Model Number:  [id number]     PPM Serial Number:  [id number] PPM DOI:  09/15/2002     PPM Implanting MD:  Nathan Bunting, MD  Lead 1    Location: RA     DOI: 09/15/2002     Model #: [id number]     Serial #: [id number]     Status: active Lead 2    Location: RV     DOI: 09/15/2002     Model #: [id number]     Serial #: [id number]     Status: active   Indications:  BRADYCARDIA  with Sick sinus syndrome   PPM Follow Up Remote Check?  No Battery Est. Longevity:  1-4 months     Pacer Dependent:  No       PPM Device Measurements Atrium  Amplitude: 1.0 mV, Impedance: 600 ohms, Threshold: 1.0  V at 0.4 msec  Episodes Coumadin:  No Atrial Pacing:  99%      Parameters Mode:  AAIR     Lower Rate Limit:  60     Upper Rate Limit:  170 Tech Comments:  No parameter changes. Device function normal.   Battery near ERI. Nathan Harm, LPN  September 29, 2010 3:55 PM  MD Comments:  Agree with above.  Impression & Recommendations:  Problem # 1:  CARDIAC PACEMAKER IN SITU (ICD-V45.01) His device is working normally. He is approaching ERI. Will recheck in several months.  Problem # 2:  HYPERTENSION (ICD-401.9) His blood pressure is well controlled. Will continue current meds. His updated medication list for this problem includes:    Aspirin Ec 325 Mg Tbec (Aspirin) ..... Once daily    Benazepril Hcl 5 Mg Tabs (Benazepril hcl) ..... One q day  Patient  Instructions: 1)  Your physician recommends that you schedule a follow-up appointment in: Jan. 2012

## 2010-12-30 NOTE — Procedures (Signed)
Summary: PC2  Medications Added BENAZEPRIL HCL 5 MG TABS (BENAZEPRIL HCL) one q day INDOMETHACIN 25 MG CAPS (INDOMETHACIN) 2 by mouth two times a day      Allergies Added: NKDA  Current Medications (verified): 1)  Aspirin Ec 325 Mg  Tbec (Aspirin) .... Once Daily 2)  Benazepril Hcl 5 Mg Tabs (Benazepril Hcl) .... One Q Day 3)  Crestor 20 Mg Tabs (Rosuvastatin Calcium) .... 1/4 Tablet By Mouth Daily or As Directed 4)  Glucosamine Sulfate   Powd (Glucosamine Sulfate) .... Once Daily 5)  Multivitamins   Tabs (Multiple Vitamin) .... Once Daily 6)  Fish Oil 300 Mg Caps (Omega-3 Fatty Acids) .... Once Daily 7)  Hydrocodone-Acetaminophen 5-325 Mg Tabs (Hydrocodone-Acetaminophen) .Marland Kitchen.. 1 By Mouth Up To 4 Times Per Day As Needed For Pain 8)  Zolpidem Tartrate 5 Mg  Tabs (Zolpidem Tartrate) .Marland Kitchen.. 1 By Mouth At Night As Needed 9)  Indomethacin 25 Mg Caps (Indomethacin) .... 2 By Mouth Two Times A Day  Allergies (verified): No Known Drug Allergies   PPM Specifications Following MD:  [name], MD     PPM Vendor:  Medtronic     PPM Model Number:  [id number]     PPM Serial Number:  [id number] PPM DOI:  09/15/2002     PPM Implanting MD:  [name], MD  Lead 1    Location: RA     DOI: 09/15/2002     Model #: 0981     Serial #: [id number]     Status: active Lead 2    Location: RV     DOI: 09/15/2002     Model #: 6213     Serial #: [id number]     Status: active   Indications:  BRADYCARDIA  with Sick sinus syndrome   PPM Follow Up Remote Check?  No Battery Voltage:  5.9k V     Battery Est. Longevity:  3-7 months     Pacer Dependent:  No       PPM Device Measurements Atrium  Amplitude: 4.0 mV, Impedance: 650 ohms, Threshold: 0.65 V at 0.4 msec  Episodes Coumadin:  No  Parameters Mode:  AAIR     Lower Rate Limit:  60     Upper Rate Limit:  170 Tech Comments:  No parameter changes.  Battery near ERI.  <0.5% rates > 167bpm.  ROV 3 months with Dr. Mckinley Jewel, LPN  June 23, 2010 3:48 PM

## 2011-01-01 NOTE — Progress Notes (Signed)
Summary: calling to schedule surgery   Phone Note Call from Patient Call back at Home Phone [phone]   Caller: Patient Summary of Call: Pt calling to schedule surgery Initial call taken by: Judie Grieve,  December 22, 2010 1:24 PM  Follow-up for Phone Call        scheduled for 01/09/11 labs 01/03/12 pt aware Dennis Bast, RN, BSN  December 22, 2010 2:10 PM

## 2011-01-01 NOTE — Progress Notes (Signed)
Summary: Request to have labs drawn Here  Phone Note Call from Patient Call back at Home Phone [phone]   Caller: Patient Summary of Call: Has lab order from Dr. Juliene Pina and would like to have labs drawn here instead of at the outpatient lab.  PLease advise if ok to do labs here. Initial call taken by: Trixie Dredge,  December 02, 2010 12:42 PM  Follow-up for Phone Call        Phone Call Completed, Appt Scheduled Follow-up by: Alfred Levins, CMA,  December 03, 2010 1:58 PM

## 2011-01-01 NOTE — Assessment & Plan Note (Signed)
Summary: pacer/saf      Allergies Added: NKDA  Visit Type:  Follow-up Primary Provider:  Birdie Sons MD   History of Present Illness: [name] returns today for followup.  He is a 65 yo man with a h/o HTN and sinus bradycardia and severe arthritis.  He is considering lower back surgery.  He had a PPM placed years ago.  He is approaching ERI.  He is interested in his options for physiologic pacing as well as MRI compatabile pacing. Since I last saw him, the notion of providing a device with closed loop stimulation was considered. Also, a MRI compatible PM would require new leads and was deemed not appropriate.  No other complaints today except for pain in the knees and hips.  Current Medications (verified): 1)  Aspirin Ec 325 Mg  Tbec (Aspirin) .... Once Daily 2)  Benazepril Hcl 5 Mg Tabs (Benazepril Hcl) .... One Q Day 3)  Crestor 20 Mg Tabs (Rosuvastatin Calcium) .... 1/4 Tablet By Mouth Daily or As Directed 4)  Glucosamine Sulfate   Powd (Glucosamine Sulfate) .... Once Daily 5)  Multivitamins   Tabs (Multiple Vitamin) .... Once Daily 6)  Fish Oil   Oil (Fish Oil) .... Daily 7)  Hydrocodone-Acetaminophen 5-325 Mg Tabs (Hydrocodone-Acetaminophen) .Marland Kitchen.. 1 By Mouth Up To 4 Times Per Day As Needed For Pain 8)  Zolpidem Tartrate 5 Mg  Tabs (Zolpidem Tartrate) .Marland Kitchen.. 1 By Mouth At Night As Needed 9)  Indomethacin 25 Mg Caps (Indomethacin) .... 2 By Mouth Two Times A Day 10)  Cyclobenzaprine Hcl 10 Mg  Tabs (Cyclobenzaprine Hcl) .Marland Kitchen.. 1 By Mouth Two Times A Day As Needed For Stiff Neck  Allergies (verified): No Known Drug Allergies  Past History:  Past Medical History: Last updated: 07/12/2007 Hypertension Cerebrovascular accident, hx of Ischemic Optic Neuropathy Hyperlipidemia Stroke Bradycardia  Past Surgical History: Last updated: 02/19/2010 Cardiac cath-1996 Cholecystectomy3/08/2003 Colonoscopy-06/23/2004 Pacemaker left eye surgery  Review of Systems  The patient  denies chest pain, syncope, dyspnea on exertion, and peripheral edema.    Vital Signs:  Patient profile:   65 year old male Height:      67.75 inches Weight:      160 pounds Pulse rate:   59 / minute BP sitting:   150 / 84  (left arm)  Vitals Entered By: Laurance Flatten CMA (December 18, 2010 1:56 PM)  Physical Exam  General:  well-developed well-nourished male in no acute distress. HEENT exam atraumatic, normocephalic a metrics muscles are intact. Neck is supple without lymphadenopathy or thyromegaly. Chest clear to auscultation. Cardiac exam S1 and S2 are regular. Abdominal exam active bowel sounds, soft and nontender. Extremities with no clubbing cyanosis or edema.   PPM Specifications Following MD:  Nathan Bunting, MD     PPM Vendor:  Medtronic     PPM Model Number:  [id number]     PPM Serial Number:  [id number] PPM DOI:  09/15/2002     PPM Implanting MD:  Nathan Bunting, MD  Lead 1    Location: RA     DOI: 09/15/2002     Model #: 1191     Serial #: [id number]     Status: active Lead 2    Location: RV     DOI: 09/15/2002     Model #: 3086     Serial #: [id number]     Status: active   Indications:  BRADYCARDIA  with Sick sinus syndrome   PPM Follow Up Remote Check?  No Battery  Est. Longevity:  ERI     Pacer Dependent:  No      Episodes Coumadin:  No  Parameters Mode:  AAIR     Lower Rate Limit:  60     Upper Rate Limit:  170 Tech Comments:  Device @ ERI to be changed out. Altha Harm, LPN  December 18, 2010 2:11 PM  MD Comments:  Agree with above.  Impression & Recommendations:  Problem # 1:  CARDIAC PACEMAKER IN SITU (ICD-V45.01) His device is at [facility].  Will schedule PM gen change. I discussed the different options regarding MRI compatible PM's and closed loop stimulation.  Will proceed with CLS which is produced by [employer]ronik.  Problem # 2:  HYPERTENSION (ICD-401.9) His pressures have been well controlled. He will continue his current meds. His updated medication list for this  problem includes:    Aspirin Ec 325 Mg Tbec (Aspirin) ..... Once daily    Benazepril Hcl 5 Mg Tabs (Benazepril hcl) ..... One q day

## 2011-01-01 NOTE — Letter (Signed)
Summary: Implantable Device Instructions  Architectural technologist, Main Office  [address]   [place], [place] [address]   Phone: [phone]  Fax: [phone]      Implantable Device Instructions  You are scheduled for:   _____ Generator Change  on 01/09/11  with Dr..  1.  Please arrive at the Short Stay Center at [facility] at 5:30am on the day of your procedure.  2.  Do not eat or drink after midnight  the night before your procedure.  3.  Complete lab work on 01/02/11 at 10:00am.  .  You do not have to be fasting.  4. All medications may be taken with small sip of water  5.  Plan for an overnight stay.  Bring your insurance cards and a list of your medications.  6.  Wash your chest and neck with antibacterial soap (any brand) the evening before and the morning of your procedure.  Rinse well.   *If you have ANY questions after you get home, please call the office [phone]. [name]  *Every attempt is made to prevent procedures from being rescheduled.  Due to the nauture of Electrophysiology, rescheduling can happen.  The physician is always aware and directs the staff when this occurs.

## 2011-01-01 NOTE — Progress Notes (Signed)
Summary: surgery date   Phone Note Call from Patient Call back at Home Phone [phone]   Caller: Patient Reason for Call: Talk to Nurse Summary of Call: pt calling in dates that he can have surgery. Jan 02, 2011. Initial call taken by: Roe Coombs,  December 19, 2010 11:39 AM  Follow-up for Phone Call        Friday 01/02/2011 will be good per pt.  Please pt with instructions and time. Follow-up by: Charolotte Capuchin, RN,  December 19, 2010 11:47 AM

## 2011-01-01 NOTE — Cardiovascular Report (Signed)
Summary: Implant Date and Device Info  Implant Date and Device Info   Imported By: Marylou Mccoy 12/02/2010 15:08:46  _____________________________________________________________________  External Attachment:    Type:   Image     Comment:   External Document

## 2011-01-02 ENCOUNTER — Other Ambulatory Visit: Payer: Self-pay | Admitting: Internal Medicine

## 2011-01-02 ENCOUNTER — Other Ambulatory Visit (INDEPENDENT_AMBULATORY_CARE_PROVIDER_SITE_OTHER): Payer: BC Managed Care – PPO

## 2011-01-02 ENCOUNTER — Encounter: Payer: Self-pay | Admitting: Internal Medicine

## 2011-01-02 DIAGNOSIS — I4891 Unspecified atrial fibrillation: Secondary | ICD-10-CM

## 2011-01-02 LAB — CBC WITH DIFFERENTIAL/PLATELET
Basophils Absolute: 0 10*3/uL (ref 0.0–0.1)
Basophils Relative: 0.3 % (ref 0.0–3.0)
Eosinophils Relative: 1.8 % (ref 0.0–5.0)
HCT: 42 % (ref 39.0–52.0)
Hemoglobin: 14.3 g/dL (ref 13.0–17.0)
Lymphocytes Relative: 25.6 % (ref 12.0–46.0)
Neutrophils Relative %: 61.3 % (ref 43.0–77.0)
Platelets: 206 10*3/uL (ref 150.0–400.0)
WBC: 8.3 10*3/uL (ref 4.5–10.5)

## 2011-01-02 LAB — PROTIME-INR
INR: 1 ratio (ref 0.8–1.0)
Prothrombin Time: 10.9 s (ref 10.2–12.4)

## 2011-01-02 LAB — BASIC METABOLIC PANEL
BUN: 20 mg/dL (ref 6–23)
Chloride: 103 mEq/L (ref 96–112)
Glucose, Bld: 96 mg/dL (ref 70–99)

## 2011-01-02 LAB — APTT: aPTT: 28.4 s (ref 21.7–28.8)

## 2011-01-08 ENCOUNTER — Telehealth: Payer: Self-pay | Admitting: Internal Medicine

## 2011-01-09 ENCOUNTER — Ambulatory Visit (HOSPITAL_COMMUNITY)
Admission: RE | Admit: 2011-01-09 | Discharge: 2011-01-09 | Disposition: A | Discharge status: Home / Self Care | Payer: BC Managed Care – PPO | Source: Ambulatory Visit | Attending: Internal Medicine | Admitting: Internal Medicine

## 2011-01-09 ENCOUNTER — Ambulatory Visit (HOSPITAL_COMMUNITY): Payer: BC Managed Care – PPO

## 2011-01-09 DIAGNOSIS — I498 Other specified cardiac arrhythmias: Secondary | ICD-10-CM

## 2011-01-09 DIAGNOSIS — Z45018 Encounter for adjustment and management of other part of cardiac pacemaker: Secondary | ICD-10-CM | POA: Insufficient documentation

## 2011-01-09 LAB — SURGICAL PCR SCREEN: Staphylococcus aureus: POSITIVE — AB

## 2011-01-13 ENCOUNTER — Encounter: Payer: Self-pay | Admitting: Internal Medicine

## 2011-01-15 NOTE — Op Note (Signed)
  NAME:  DAMASCUS, FELDPAUSCH NO.:  [id number]  MEDICAL RECORD NO.:  [id number]           PATIENT TYPE:  O  LOCATION:  [facility]                         FACILITY:  [facility]  PHYSICIAN:  Doylene Canning. Ladona Ridgel, MD    DATE OF BIRTH:  [birth year]  DATE OF PROCEDURE:  01/09/2011 DATE OF DISCHARGE:  01/09/2011                              OPERATIVE REPORT   PROCEDURE PERFORMED:  Removal of previously implanted dual-chamber pacemaker which had reached elective replacement indication and insertion of a new dual-chamber pacemaker with pacemaker pocket revision.  INTRODUCTION:  The patient is a 65 year old male with a history of symptomatic bradycardia and very profound sinus node dysfunction.  His resting heart rate without pacing is 130 beats per minute.  The patient initially underwent permanent pacemaker insertion approximately 8 years ago and has reached elective replacement indication and now returns for pacemaker generator change and insertion of a new device.  PROCEDURE:  After informed consent was obtained, the patient was taken to the Diagnostic EP Lab in a fasting state.  After usual preparation and draping, intravenous fentanyl and midazolam was given for sedation. 30 mL of lidocaine was infiltrated over the old pacemaker insertion site and a 5-cm incision was carried out over this region.  Electrocautery was utilized to dissect down the fascial plane.  The pacemaker pocket was entered without difficulty and the generator was removed with gentle traction.  The leads were disconnected from the device and evaluated. The P-waves were 3, the R-waves 14, and pacing impedance was 480 ohms in the atrium and 430 ohms in the ventricle.  Threshold was less than a volt at 0.5 milliseconds in both the atrium and the ventricle.  With these satisfactory parameters, the pacemaker pocket was enlarged to accommodate the larger pacemaker.  The pacing leads were Medtronic 5076 bipolar active  fixation leads.  The atrial lead serial number was [id number], the ventricular lead serial number was [id number].  After the pacemaker pocket was revised to accommodate the larger device, the Biotronik Evia dual chamber pacemaker, serial number [id number] was connected to the atrial and ventricular leads and placed back in the subcutaneous pocket.  The pocket was irrigated with gentamicin. Electrocautery was utilized to assure hemostasis and the incision was closed with 2-0 and 3-0 Vicryl.  Benzoin and Steri-Strips were painted on the skin.  A pressure dressing was applied.  The patient was returned to his room in normal and satisfactory condition.  COMPLICATIONS:  There were no immediate procedure complications.  RESULTS:  This demonstrate successful removal of previously implanted dual-chamber pacemaker followed by pacemaker pocket revision, followed by insertion of a new Biotronik dual-chamber pacemaker.     Doylene Canning. Ladona Ridgel, MD    GWT/MEDQ  D:  01/09/2011  T:  01/09/2011  Job:  [id number]  Electronically Signed by [name] MD on 01/15/2011 05:43:54 PM

## 2011-01-15 NOTE — Cardiovascular Report (Signed)
Summary: Office Visit   Office Visit   Imported By: Roderic Ovens 01/06/2011 16:20:39  _____________________________________________________________________  External Attachment:    Type:   Image     Comment:   External Document

## 2011-01-15 NOTE — Progress Notes (Signed)
Summary: pt needs instructions for procedure for tomorrow   Phone Note Call from Patient Call back at Home Phone [phone]   Caller: Patient Reason for Call: Talk to Nurse, Talk to Doctor Summary of Call: pt dosen't where his procedure will happen and what time he needs to be there  Initial call taken by: Omer Jack,  January 08, 2011 12:35 PM  Follow-up for Phone Call        spoke with pt and gave him instructions again as to where to go.  He cant find the paper I printed for him Dennis Bast, RN, BSN  January 08, 2011 12:53 PM

## 2011-01-21 NOTE — Cardiovascular Report (Signed)
Summary: Pre-Op Orders  Pre-Op Orders   Imported By: Marylou Mccoy 01/16/2011 09:55:03  _____________________________________________________________________  External Attachment:    Type:   Image     Comment:   External Document

## 2011-01-21 NOTE — Miscellaneous (Signed)
Summary: Device change out  Clinical Lists Changes  Observations: Added new observation of PPM DOI: 01/09/2011 (01/13/2011 13:34) Added new observation of PPM SERL#: [id number]  (01/13/2011 13:34) Added new observation of PPM MODL#: [id number]  (01/13/2011 16:60) Added new observation of PACEMAKERMFG: Biotronik  (01/13/2011 13:34) Added new observation of PPMEXPLCOMM: 01/09/11 Vitatron [id number] explanted  (01/13/2011 13:34)      PPM Specifications Following MD:  [name], MD     PPM Vendor:  Biotronik     PPM Model Number:  [id number]     PPM Serial Number:  [id number] PPM DOI:  01/09/2011     PPM Implanting MD:  [name], MD  Lead 1    Location: RA     DOI: 09/15/2002     Model #: 7322     Serial #: [id number]     Status: active Lead 2    Location: RV     DOI: 09/15/2002     Model #: 3762     Serial #: [id number]     Status: active   Indications:  BRADYCARDIA  with Sick sinus syndrome  Explantation Comments:  01/09/11 Vitatron [id number] explanted  PPM Follow Up Pacer Dependent:  No      Episodes Coumadin:  No  Parameters Mode:  AAIR     Lower Rate Limit:  60     Upper Rate Limit:  170

## 2011-01-26 ENCOUNTER — Encounter: Payer: Self-pay | Admitting: Internal Medicine

## 2011-01-26 ENCOUNTER — Ambulatory Visit (INDEPENDENT_AMBULATORY_CARE_PROVIDER_SITE_OTHER): Payer: BC Managed Care – PPO

## 2011-01-26 DIAGNOSIS — I495 Sick sinus syndrome: Secondary | ICD-10-CM

## 2011-02-02 ENCOUNTER — Telehealth (INDEPENDENT_AMBULATORY_CARE_PROVIDER_SITE_OTHER): Payer: Self-pay | Admitting: *Deleted

## 2011-02-05 NOTE — Procedures (Signed)
Summary: wound check per pt call/lg      Allergies Added: NKDA  Current Medications (verified): 1)  Aspirin Ec 325 Mg  Tbec (Aspirin) .... Once Daily 2)  Benazepril Hcl 5 Mg Tabs (Benazepril Hcl) .... One Q Day 3)  Crestor 20 Mg Tabs (Rosuvastatin Calcium) .... 1/4 Tablet By Mouth Daily or As Directed 4)  Glucosamine Sulfate   Powd (Glucosamine Sulfate) .... Once Daily 5)  Multivitamins   Tabs (Multiple Vitamin) .... Once Daily 6)  Fish Oil   Oil (Fish Oil) .... Daily 7)  Hydrocodone-Acetaminophen 5-325 Mg Tabs (Hydrocodone-Acetaminophen) .Marland Kitchen.. 1 By Mouth Up To 4 Times Per Day As Needed For Pain 8)  Zolpidem Tartrate 5 Mg  Tabs (Zolpidem Tartrate) .Marland Kitchen.. 1 By Mouth At Night As Needed 9)  Indomethacin 25 Mg Caps (Indomethacin) .... 2 By Mouth Two Times A Day 10)  Cyclobenzaprine Hcl 10 Mg  Tabs (Cyclobenzaprine Hcl) .Marland Kitchen.. 1 By Mouth Two Times A Day As Needed For Stiff Neck  Allergies (verified): No Known Drug Allergies  PPM Specifications Following MD:  [name], MD     PPM Vendor:  Biotronik     PPM Model Number:  [id number]     PPM Serial Number:  [id number] PPM DOI:  01/09/2011     PPM Implanting MD:  [name], MD  Lead 1    Location: RA     DOI: 09/15/2002     Model #: 2956     Serial #: [id number]     Status: active Lead 2    Location: RV     DOI: 09/15/2002     Model #: [id number]     Serial #: [id number]     Status: active   Indications:  BRADYCARDIA  with Sick sinus syndrome  Explantation Comments:  01/09/11 Vitatron [id number] explanted  PPM Follow Up Pacer Dependent:  No      Episodes Coumadin:  No  Parameters Mode:  AAIR     Lower Rate Limit:  60     Upper Rate Limit:  170 Tech Comments:  see paceart report. Vella Kohler  January 26, 2011 3:03 PM

## 2011-02-10 NOTE — Progress Notes (Signed)
Summary: has question re device    Phone Note Call from Patient Call back at Home Phone [phone]   Reason for Call: Talk to Nurse Summary of Call: pt states he was work at home when he felt like someone was mess around with his pacermaker like they do when he gets it check. after pt states everything started to go black for a couple seconds. pt has some questions Initial call taken by: Roe Coombs,  February 02, 2011 3:23 PM  Follow-up for Phone Call        pt calling back feeling pressure in chest and having lightheadedness for about 2 hrs Glynda Jaeger  February 02, 2011 4:25 PM  Additional Follow-up for Phone Call Additional follow up Details #1::        he had an episode while sittting today where he felt "Like when he's in the office and we're testing his device," then he felt as though things were getting black but he never passed out and has been fine since.  He wanted to know if it was ok to swim tonight.  He was instructed to call us with any more episodes and while swimming if he feels worse to call 911.   Additional Follow-up by: Altha Harm, LPN,  February 02, 2011 5:09 PM

## 2011-02-10 NOTE — Letter (Signed)
Summary: [facility]  [facility]   Imported By: Marylou Mccoy 02/04/2011 11:46:58  _____________________________________________________________________  External Attachment:    Type:   Image     Comment:   External Document

## 2011-02-10 NOTE — Cardiovascular Report (Signed)
Summary: Office Visit   Office Visit   Imported By: Roderic Ovens 02/06/2011 14:55:55  _____________________________________________________________________  External Attachment:    Type:   Image     Comment:   External Document

## 2011-02-17 LAB — FUNGUS CULTURE W SMEAR

## 2011-02-17 LAB — AFB CULTURE WITH SMEAR (NOT AT ARMC): Acid Fast Smear: NONE SEEN

## 2011-02-17 LAB — BODY FLUID CULTURE: Culture: NO GROWTH

## 2011-02-18 ENCOUNTER — Encounter (HOSPITAL_COMMUNITY): Payer: BC Managed Care – PPO

## 2011-02-18 ENCOUNTER — Other Ambulatory Visit (HOSPITAL_COMMUNITY): Payer: Self-pay | Admitting: Surgery

## 2011-02-18 ENCOUNTER — Ambulatory Visit (HOSPITAL_COMMUNITY)
Admission: RE | Admit: 2011-02-18 | Discharge: 2011-02-18 | Disposition: A | Discharge status: Home / Self Care | Payer: BC Managed Care – PPO | Source: Ambulatory Visit | Attending: Surgery | Admitting: Surgery

## 2011-02-18 ENCOUNTER — Other Ambulatory Visit: Payer: Self-pay | Admitting: Surgery

## 2011-02-18 DIAGNOSIS — Z01812 Encounter for preprocedural laboratory examination: Secondary | ICD-10-CM | POA: Insufficient documentation

## 2011-02-18 DIAGNOSIS — Z01818 Encounter for other preprocedural examination: Secondary | ICD-10-CM | POA: Insufficient documentation

## 2011-02-18 DIAGNOSIS — K4090 Unilateral inguinal hernia, without obstruction or gangrene, not specified as recurrent: Secondary | ICD-10-CM

## 2011-02-18 DIAGNOSIS — Z01810 Encounter for preprocedural cardiovascular examination: Secondary | ICD-10-CM | POA: Insufficient documentation

## 2011-02-18 LAB — BASIC METABOLIC PANEL
BUN: 19 mg/dL (ref 6–23)
CO2: 29 mEq/L (ref 19–32)
Chloride: 102 mEq/L (ref 96–112)
Creatinine, Ser: 1.22 mg/dL (ref 0.4–1.5)
Glucose, Bld: 99 mg/dL (ref 70–99)

## 2011-02-18 LAB — CBC
Hemoglobin: 15 g/dL (ref 13.0–17.0)
MCH: 29.1 pg (ref 26.0–34.0)
MCHC: 33.1 g/dL (ref 30.0–36.0)
MCV: 88 fL (ref 78.0–100.0)
RBC: 5.15 MIL/uL (ref 4.22–5.81)

## 2011-02-20 ENCOUNTER — Telehealth: Payer: Self-pay | Admitting: Internal Medicine

## 2011-02-20 NOTE — Telephone Encounter (Signed)
Pt calling stating has not been seen since pacer insertion--has EPH appoint in may, but has been experiencing, while exercising SOB,PALPITATIONS,AND IRREGULAR HEART BEAT--HAS CALLED ONCE BEFORE, BUT FELT LIKE HE WAS PUT OFF AND TOLD NOT TO Donnalee Curry is having a hernia repair next Friday and is afraid his pacer is not working as it should--advised Dr Ladona Ridgel has no open appoints next week, but we can make an appoint with our PA--Scott Weaver--pt agrees--nt

## 2011-02-20 NOTE — Telephone Encounter (Signed)
Pt is coming in for appoint for EPH, also pt is having surgery on friday3/30 so cannot be here to see scott--instead we have him coming in 02/23/11 at 4pm to see dr taylor about all the problems he has experienced since pacer insertion--nt

## 2011-02-20 NOTE — Telephone Encounter (Signed)
Pt is very upset feels like no body care and he said he has not felt right since he has had pacer placed and he wants to talk to someone right now. He is to have surgery next week and wants to talk to Dr. Ladona Ridgel and have a OV to make sure he can have the surgery

## 2011-02-23 ENCOUNTER — Encounter: Payer: Self-pay | Admitting: Internal Medicine

## 2011-02-23 ENCOUNTER — Encounter: Payer: Self-pay | Admitting: *Deleted

## 2011-02-23 ENCOUNTER — Ambulatory Visit (INDEPENDENT_AMBULATORY_CARE_PROVIDER_SITE_OTHER): Payer: BC Managed Care – PPO | Admitting: Internal Medicine

## 2011-02-23 VITALS — BP 137/77 | HR 76 | Resp 12 | Ht 68.0 in | Wt 161.0 lb

## 2011-02-23 DIAGNOSIS — Z950 Presence of cardiac pacemaker: Secondary | ICD-10-CM

## 2011-02-23 DIAGNOSIS — Z01810 Encounter for preprocedural cardiovascular examination: Secondary | ICD-10-CM

## 2011-02-23 DIAGNOSIS — E785 Hyperlipidemia, unspecified: Secondary | ICD-10-CM

## 2011-02-23 DIAGNOSIS — I495 Sick sinus syndrome: Secondary | ICD-10-CM

## 2011-02-23 DIAGNOSIS — R079 Chest pain, unspecified: Secondary | ICD-10-CM

## 2011-02-23 NOTE — Assessment & Plan Note (Signed)
His device is working normally. Today we reprogrammed his device to increase his rate response. He has NSVT on the device telemetry.

## 2011-02-23 NOTE — Progress Notes (Signed)
HPI Mr. Studstill returns today for followup. He is a pleasant middle aged man with a h/o bradycardia s/p PPM, remote stroke, HTN, and dyslipidemia. He has undergone PPM generator change and has noted progressive sob, fatigue and chest discomfort. He notes that his endurance with exercise (swimming) is much worse. He also relates a h/o near syncope several days ago. No palpitations.  Allergies not on file   Current Outpatient Prescriptions  Medication Sig Dispense Refill  . aspirin 325 MG EC tablet Take 325 mg by mouth daily.        . benazepril (LOTENSIN) 5 MG tablet Take 5 mg by mouth daily.        . cyclobenzaprine (FLEXERIL) 10 MG tablet Take 10 mg by mouth 2 (two) times daily as needed.        . fish oil-omega-3 fatty acids 1000 MG capsule Take 2 g by mouth daily.        . Glucosamine-Chondroitin (GLUCOSAMINE CHONDR COMPLEX PO) Take by mouth daily.        Marland Kitchen HYDROcodone-acetaminophen (NORCO) 5-325 MG per tablet Take 1 tablet by mouth every 6 (six) hours as needed.        . indomethacin (INDOCIN) 25 MG capsule Take 50 mg by mouth 2 (two) times daily with a meal.        . Multiple Vitamin (MULTIVITAMIN) tablet Take 1 tablet by mouth daily.        . rosuvastatin (CRESTOR) 20 MG tablet Take 5 mg by mouth daily. Take 1/4 tablet to make 5 mg daily.      Marland Kitchen zolpidem (AMBIEN) 5 MG tablet Take 5 mg by mouth at bedtime as needed.           Past Medical History  Diagnosis Date  . HTN (hypertension)   . CVA (cerebral vascular accident)   . Ischemic optic neuropathy   . Other and unspecified hyperlipidemia   . Bradycardia     ROS:   All systems reviewed and negative except as noted in the HPI.   Past Surgical History  Procedure Date  . Cardiac catheterization 1996  . Cholecystectomy 02/07/2003  . Colonoscopy 06/23/2004  . Pacemaker insertion   . Eye surgery     left eye     No family history on file.   History   Social History  . Marital Status: Married    Spouse Name: N/A      Number of Children: N/A  . Years of Education: N/A   Occupational History  . Not on file.   Social History Main Topics  . Smoking status: Former Games developer  . Smokeless tobacco: Not on file  . Alcohol Use: Yes  . Drug Use: No  . Sexually Active:    Other Topics Concern  . Not on file   Social History Narrative  . No narrative on file     BP 137/77  Pulse 76  Resp 12  Ht 5\' 8"  (1.727 m)  Wt 161 lb (73.029 kg)  BMI 24.48 kg/m2  Physical Exam:  Well appearing middle aged man  NAD HEENT: Unremarkable Neck:  No JVD, no thyromegally Lymphatics:  No adenopathy Back:  No CVA tenderness Lungs:  Clear. Well healed PPM incision. HEART:  Regular rate rhythm, no murmurs, no rubs, no clicks Abd:  Flat, positive bowel sounds, no organomegally, no rebound, no guarding Ext:  2 plus pulses, no edema, no cyanosis, no clubbing Skin:  No rashes no nodules Neuro:  CN II through XII  intact, motor grossly intact  DEVICE  Normal device function.  See PaceArt for details. NSVT.    Assess/Plan:

## 2011-02-23 NOTE — Assessment & Plan Note (Signed)
He will continue his statin therapy. I have asked him to continue his low fat diet and exercise in moderation.

## 2011-02-23 NOTE — Patient Instructions (Addendum)
Your physician has requested that you have cardiac CT. Cardiac computed tomography (CT) is a painless test that uses an x-ray machine to take clear, detailed pictures of your heart. For further information please visit [email or web address] Please follow instruction sheet as given.   Your physician recommends that you schedule a follow-up appointment in: 3 months

## 2011-02-23 NOTE — Assessment & Plan Note (Signed)
This is a problem in that while he has had atypical symptoms before, they are now worse and related to exertion. He has a very strong h/o CAD (father died at 9 after 3 MI's) and he has evidence of NSVT which was associated with near syncope. While cardiac catheterization would normally be warranted, the patient had a stroke after catheterization 15 yrs ago. He cannot walk on the treadmill with his bad hip. For this reason, I have recommended he proceed with cardiac CT to look at his coronaries. Surgery with general anesthesia will be postponed for now.

## 2011-02-27 ENCOUNTER — Telehealth: Payer: Self-pay | Admitting: Internal Medicine

## 2011-02-27 ENCOUNTER — Ambulatory Visit (HOSPITAL_COMMUNITY): Admission: RE | Admit: 2011-02-27 | Payer: BC Managed Care – PPO | Source: Ambulatory Visit | Admitting: Surgery

## 2011-02-27 ENCOUNTER — Ambulatory Visit: Payer: BC Managed Care – PPO | Admitting: Physician Assistant

## 2011-02-27 DIAGNOSIS — I10 Essential (primary) hypertension: Secondary | ICD-10-CM

## 2011-03-09 ENCOUNTER — Ambulatory Visit (HOSPITAL_BASED_OUTPATIENT_CLINIC_OR_DEPARTMENT_OTHER): Payer: BC Managed Care – PPO | Admitting: Radiology

## 2011-03-09 ENCOUNTER — Ambulatory Visit (HOSPITAL_COMMUNITY): Payer: BC Managed Care – PPO | Attending: Internal Medicine | Admitting: Radiology

## 2011-03-09 DIAGNOSIS — R0789 Other chest pain: Secondary | ICD-10-CM

## 2011-03-09 DIAGNOSIS — I10 Essential (primary) hypertension: Secondary | ICD-10-CM

## 2011-03-09 DIAGNOSIS — R072 Precordial pain: Secondary | ICD-10-CM

## 2011-03-09 MED ORDER — SODIUM CHLORIDE 0.9 % IV SOLN: 40.0000 ug/kg | INTRAVENOUS | Status: DC

## 2011-03-09 MED ORDER — ATROPINE SULFATE 0.1 MG/ML IJ SOLN
1.0000 mg | Freq: Once | INTRAMUSCULAR | Status: AC
  Administered 2011-03-09: 1 mg via INTRAVENOUS

## 2011-03-10 ENCOUNTER — Telehealth: Payer: Self-pay | Admitting: Internal Medicine

## 2011-03-10 NOTE — Telephone Encounter (Signed)
Called patient back-scheduled for hip surgery on 03/30/11 and wants to know if cleared by Dr. Ladona Ridgel s/p stress echo he had on 03/09/11.  Anxious to hear the results.

## 2011-03-13 ENCOUNTER — Other Ambulatory Visit (INDEPENDENT_AMBULATORY_CARE_PROVIDER_SITE_OTHER): Payer: BC Managed Care – PPO | Admitting: Internal Medicine

## 2011-03-13 ENCOUNTER — Encounter: Payer: Self-pay | Admitting: Cardiology

## 2011-03-13 DIAGNOSIS — Z0000 Encounter for general adult medical examination without abnormal findings: Secondary | ICD-10-CM

## 2011-03-13 LAB — BASIC METABOLIC PANEL
BUN: 24 mg/dL — ABNORMAL HIGH (ref 6–23)
CO2: 27 mEq/L (ref 19–32)
Chloride: 105 mEq/L (ref 96–112)
Creatinine, Ser: 1.2 mg/dL (ref 0.4–1.5)
Glucose, Bld: 92 mg/dL (ref 70–99)

## 2011-03-13 LAB — CBC WITH DIFFERENTIAL/PLATELET
Basophils Relative: 0.3 % (ref 0.0–3.0)
Eosinophils Absolute: 0.3 10*3/uL (ref 0.0–0.7)
MCHC: 34.4 g/dL (ref 30.0–36.0)
MCV: 88.1 fl (ref 78.0–100.0)
Monocytes Absolute: 0.7 10*3/uL (ref 0.1–1.0)
Neutrophils Relative %: 57.1 % (ref 43.0–77.0)
Platelets: 220 10*3/uL (ref 150.0–400.0)
RBC: 4.87 Mil/uL (ref 4.22–5.81)
RDW: 13.7 % (ref 11.5–14.6)

## 2011-03-13 LAB — HEPATIC FUNCTION PANEL
Bilirubin, Direct: 0.1 mg/dL (ref 0.0–0.3)
Total Protein: 6.5 g/dL (ref 6.0–8.3)

## 2011-03-13 LAB — POCT URINALYSIS DIPSTICK
Ketones, UA: NEGATIVE
Protein, UA: NEGATIVE
Spec Grav, UA: 1.015
pH, UA: 5.5

## 2011-03-13 LAB — LIPID PANEL
HDL: 43 mg/dL (ref >39.00)
Triglycerides: 384 mg/dL — ABNORMAL HIGH (ref 0.0–149.0)

## 2011-03-19 NOTE — Telephone Encounter (Signed)
Stress echo low risk. May proceed with surgery.

## 2011-03-20 ENCOUNTER — Ambulatory Visit (INDEPENDENT_AMBULATORY_CARE_PROVIDER_SITE_OTHER): Payer: BC Managed Care – PPO | Admitting: Internal Medicine

## 2011-03-20 ENCOUNTER — Encounter: Payer: Self-pay | Admitting: Internal Medicine

## 2011-03-20 VITALS — BP 134/90 | HR 76 | Temp 98.4°F | Ht 68.0 in | Wt 163.0 lb

## 2011-03-20 DIAGNOSIS — Z0000 Encounter for general adult medical examination without abnormal findings: Secondary | ICD-10-CM

## 2011-03-20 NOTE — Progress Notes (Signed)
  Subjective:    Patient ID: [name], male    DOB: May 01, 1946, 65 y.o.   MRN: [id number]  HPI  cpx  New onset left shoulder pain---"can't swim"  Past Medical History  Diagnosis Date  . HTN (hypertension)   . CVA (cerebral vascular accident)   . Ischemic optic neuropathy   . Other and unspecified hyperlipidemia   . Bradycardia    Past Surgical History  Procedure Date  . Cardiac catheterization 1996  . Cholecystectomy 02/07/2003  . Colonoscopy 06/23/2004  . Pacemaker insertion   . Eye surgery     left eye    reports that he has quit smoking. He does not have any smokeless tobacco history on file. He reports that he drinks alcohol. He reports that he does not use illicit drugs. family history includes Cancer in his mother; Diabetes in his father; Heart attack in his father and mother; Heart disease in his father; and Hypertension in his mother. No Known Allergies   Review of Systems  patient denies chest pain, shortness of breath, orthopnea. Denies lower extremity edema, abdominal pain, change in appetite, change in bowel movements. Patient denies rashes, musculoskeletal complaints. No other specific complaints in a complete review of systems.      Objective:   Physical Exam Well-developed male in no acute distress. HEENT exam atraumatic, normocephalic, extraocular muscles are intact. Conjunctivae are pink without exudate. Neck is supple without lymphadenopathy, thyromegaly, jugular venous distention. Chest is clear to auscultation without increased work of breathing. Cardiac exam S1-S2 are regular. The PMI is normal. No significant murmurs or gallops. Abdominal exam active bowel sounds, soft, nontender. No abdominal bruits. Extremities no clubbing cyanosis or edema. Peripheral pulses are normal without bruits. Neurologic exam alert and oriented without any motor or sensory deficits. Rectal exam normal tone prostate normal size without masses or asymmetry.  Assessment &  Plan:  Well Visit  Health maint UTD  Will need shoulder evaluation after THA

## 2011-03-23 ENCOUNTER — Other Ambulatory Visit: Payer: Self-pay | Admitting: *Deleted

## 2011-03-23 MED ORDER — HYDROCODONE-ACETAMINOPHEN 5-325 MG PO TABS: 1.0000 | ORAL_TABLET | Freq: Four times a day (QID) | ORAL | Status: DC | PRN

## 2011-03-23 NOTE — Telephone Encounter (Signed)
Pt was seen for shoulder pain and was not given a rx for hydrocodone.  It was on his med list and I asked Dr Lovell Sheehan if he would sign rx for Dr Cato Mulligan.  Per Dr Lovell Sheehan ok to give 30 pills rx printed, signed and given to pt

## 2011-03-24 ENCOUNTER — Other Ambulatory Visit: Payer: Self-pay | Admitting: Orthopedic Surgery

## 2011-03-24 ENCOUNTER — Encounter (HOSPITAL_COMMUNITY): Payer: BC Managed Care – PPO

## 2011-03-24 ENCOUNTER — Ambulatory Visit (HOSPITAL_COMMUNITY)
Admission: RE | Admit: 2011-03-24 | Discharge: 2011-03-24 | Disposition: A | Discharge status: Home / Self Care | Payer: BC Managed Care – PPO | Source: Ambulatory Visit | Attending: Orthopedic Surgery | Admitting: Orthopedic Surgery

## 2011-03-24 ENCOUNTER — Other Ambulatory Visit (HOSPITAL_COMMUNITY): Payer: Self-pay | Admitting: Orthopedic Surgery

## 2011-03-24 DIAGNOSIS — Z01812 Encounter for preprocedural laboratory examination: Secondary | ICD-10-CM | POA: Insufficient documentation

## 2011-03-24 DIAGNOSIS — M1610 Unilateral primary osteoarthritis, unspecified hip: Secondary | ICD-10-CM | POA: Insufficient documentation

## 2011-03-24 DIAGNOSIS — Z01818 Encounter for other preprocedural examination: Secondary | ICD-10-CM

## 2011-03-24 DIAGNOSIS — M169 Osteoarthritis of hip, unspecified: Secondary | ICD-10-CM | POA: Insufficient documentation

## 2011-03-24 LAB — URINALYSIS, ROUTINE W REFLEX MICROSCOPIC
Bilirubin Urine: NEGATIVE
Hgb urine dipstick: NEGATIVE
Specific Gravity, Urine: 1.021 (ref 1.005–1.030)
pH: 5.5 (ref 5.0–8.0)

## 2011-03-24 LAB — COMPREHENSIVE METABOLIC PANEL
ALT: 39 U/L (ref 0–53)
Albumin: 4 g/dL (ref 3.5–5.2)
Alkaline Phosphatase: 70 U/L (ref 39–117)
BUN: 19 mg/dL (ref 6–23)
Calcium: 9.4 mg/dL (ref 8.4–10.5)
Potassium: 4.4 mEq/L (ref 3.5–5.1)
Sodium: 141 mEq/L (ref 135–145)
Total Protein: 6.8 g/dL (ref 6.0–8.3)

## 2011-03-24 LAB — CBC
HCT: 43.6 % (ref 39.0–52.0)
Hemoglobin: 14.6 g/dL (ref 13.0–17.0)
MCV: 86.7 fL (ref 78.0–100.0)
RBC: 5.03 MIL/uL (ref 4.22–5.81)
WBC: 9.2 10*3/uL (ref 4.0–10.5)

## 2011-03-24 LAB — PROTIME-INR
INR: 1 (ref 0.00–1.49)
Prothrombin Time: 13.4 seconds (ref 11.6–15.2)

## 2011-03-24 LAB — APTT: aPTT: 31 seconds (ref 24–37)

## 2011-03-24 LAB — SURGICAL PCR SCREEN: MRSA, PCR: NEGATIVE

## 2011-03-25 ENCOUNTER — Telehealth: Payer: Self-pay | Admitting: *Deleted

## 2011-03-25 ENCOUNTER — Encounter: Payer: Self-pay | Admitting: Internal Medicine

## 2011-03-25 DIAGNOSIS — G4700 Insomnia, unspecified: Secondary | ICD-10-CM

## 2011-03-25 MED ORDER — ZOLPIDEM TARTRATE 5 MG PO TABS: 5.0000 mg | ORAL_TABLET | Freq: Every evening | ORAL | Status: DC | PRN

## 2011-03-25 NOTE — Telephone Encounter (Signed)
Pt called to check on status of refill for Ambien. Pt wants this call in to [facility] on [address]. Pt req to be notified when this has been called in.

## 2011-03-25 NOTE — Telephone Encounter (Signed)
Requesting refill on ambien today- didn't leave pharmacy- his number is [phone]

## 2011-03-25 NOTE — Telephone Encounter (Signed)
rx called in, pt aware 

## 2011-03-30 ENCOUNTER — Inpatient Hospital Stay (HOSPITAL_COMMUNITY): Payer: BC Managed Care – PPO

## 2011-03-30 ENCOUNTER — Inpatient Hospital Stay (HOSPITAL_COMMUNITY)
Admission: RE | Admit: 2011-03-30 | Discharge: 2011-04-02 | DRG: 818 | Disposition: A | Discharge status: Home Health | Payer: BC Managed Care – PPO | Source: Ambulatory Visit | Attending: Orthopedic Surgery | Admitting: Orthopedic Surgery

## 2011-03-30 DIAGNOSIS — IMO0002 Reserved for concepts with insufficient information to code with codable children: Secondary | ICD-10-CM | POA: Diagnosis present

## 2011-03-30 DIAGNOSIS — Z8673 Personal history of transient ischemic attack (TIA), and cerebral infarction without residual deficits: Secondary | ICD-10-CM

## 2011-03-30 DIAGNOSIS — M169 Osteoarthritis of hip, unspecified: Principal | ICD-10-CM | POA: Diagnosis present

## 2011-03-30 DIAGNOSIS — I10 Essential (primary) hypertension: Secondary | ICD-10-CM | POA: Diagnosis present

## 2011-03-30 DIAGNOSIS — I498 Other specified cardiac arrhythmias: Secondary | ICD-10-CM | POA: Diagnosis present

## 2011-03-30 DIAGNOSIS — M1610 Unilateral primary osteoarthritis, unspecified hip: Principal | ICD-10-CM | POA: Diagnosis present

## 2011-03-30 DIAGNOSIS — Z950 Presence of cardiac pacemaker: Secondary | ICD-10-CM

## 2011-03-30 LAB — TYPE AND SCREEN
ABO/RH(D): O POS
Antibody Screen: NEGATIVE

## 2011-03-31 LAB — BASIC METABOLIC PANEL WITH GFR
BUN: 16 mg/dL (ref 6–23)
CO2: 24 meq/L (ref 19–32)
Calcium: 8 mg/dL — ABNORMAL LOW (ref 8.4–10.5)
Chloride: 104 meq/L (ref 96–112)
Creatinine, Ser: 1.15 mg/dL (ref 0.4–1.5)
GFR calc non Af Amer: >60 mL/min
Glucose, Bld: 163 mg/dL — ABNORMAL HIGH (ref 70–99)
Potassium: 4 meq/L (ref 3.5–5.1)
Sodium: 136 meq/L (ref 135–145)

## 2011-03-31 LAB — CBC
Hemoglobin: 10.7 g/dL — ABNORMAL LOW (ref 13.0–17.0)
Platelets: 195 10*3/uL (ref 150–400)
RBC: 3.72 MIL/uL — ABNORMAL LOW (ref 4.22–5.81)
WBC: 11.4 10*3/uL — ABNORMAL HIGH (ref 4.0–10.5)

## 2011-04-01 LAB — BASIC METABOLIC PANEL
BUN: 13 mg/dL (ref 6–23)
CO2: 25 mEq/L (ref 19–32)
Chloride: 101 mEq/L (ref 96–112)
Glucose, Bld: 122 mg/dL — ABNORMAL HIGH (ref 70–99)
Potassium: 3.8 mEq/L (ref 3.5–5.1)

## 2011-04-01 LAB — CBC
HCT: 30.9 % — ABNORMAL LOW (ref 39.0–52.0)
Hemoglobin: 10.2 g/dL — ABNORMAL LOW (ref 13.0–17.0)
MCV: 87.3 fL (ref 78.0–100.0)
Platelets: 211 10*3/uL (ref 150–400)
RBC: 3.54 MIL/uL — ABNORMAL LOW (ref 4.22–5.81)
WBC: 12.8 10*3/uL — ABNORMAL HIGH (ref 4.0–10.5)

## 2011-04-01 NOTE — Op Note (Signed)
NAME:  [name], [name] NO.:  [id number]  MEDICAL RECORD NO.:  [id number]           PATIENT TYPE:  I  LOCATION:  1620                         FACILITY:  [facility]  PHYSICIAN:  Ollen Gross, M.D.    DATE OF BIRTH:  31-Dec-1945  DATE OF PROCEDURE:  03/30/2011 DATE OF DISCHARGE:                              OPERATIVE REPORT   PREOPERATIVE DIAGNOSIS:  Osteoarthritis, right hip.  POSTOPERATIVE DIAGNOSIS:  Osteoarthritis, right hip.  PROCEDURE:  Right total hip arthroplasty.  SURGEON:  Ollen Gross, M.D.  ASSISTANT:  Alexzandrew L. Perkins, P.A.C.  ANESTHESIA:  Spinal.  BLOOD LOSS:  200.Marland Kitchen  DRAIN:  Hemovac x1.  COMPLICATIONS:  None.  CONDITION:  Stable to recovery.  BRIEF CLINICAL NOTE:  Mr. Wilkie is a 65 year old male with end-stage arthritis of the right hip with progressively worsening pain and dysfunction.  He has failed nonoperative management and presents for total hip arthroplasty.  PROCEDURE IN DETAIL:  After successful administration of spinal anesthetic, the patient was placed in left lateral decubitus position with the right side up and held with the hip positioner.  Right lower extremity was isolated from its perineum with plastic drapes and prepped and draped in the usual sterile fashion.  Short posterolateral incision was made with a #10 blade through the subcutaneous tissue to the level of fascia lata which was incised in line with the skin incision.  The sciatic nerve was palpated and protected and short rotators and capsule were isolated off the femur.  Hip was dislocated and the center of femoral head was marked.  A trial prosthesis was placed and the center of the trial head was placed at the level corresponding to the center of the patient's native femoral head.  Osteotomy line was marked on the femoral neck and osteotomy made with an oscillating saw.  Femoral head was removed and the femoral retractors were placed to gain access to  the canal.  The starter reamer was passed to the femoral canal and then the canal was thoroughly irrigated to remove the fatty contents.  We did axial reaming up to 13.5 mm, proximal reaming to an 4F and sleeve machined to a small.  4F small trial sleeve was placed.  The femur was retracted anteriorly to gain acetabular exposure. Acetabular retractors were placed in the labrum and osteophytes removed. Acetabular reaming was performed to 53 mm for placement of 54 mm pinnacle acetabular shell.  This was placed in anatomic position.  I did not use any additional dome screws.  Apex hole eliminator was placed and then 36 mm neutral +4 marathon liner was placed.  Trial femur was placed, this was 18 x 13 with a 36 +8 neck matching his native anteversion.  A 36.0 head was placed.  The hip was reduced with outstanding stability.  There was full extension, full external rotation 70 degrees of flexion, 40 degrees of adduction, 90 degrees of internal rotation, 90 degrees of flexion and 70 degrees of internal rotation.  By placing the right leg on top of the left, it feels as though the leg lengths were equal.  The hip was then dislocated and the  trials were removed.  The permanent 32F small sleeve was placed with 18 x 13 stem with a 36 +12 neck matching native anteversion.  The 36.0 ceramic head was placed.  I noted that there appeared to be a tiny crack at the calcar anteriorly.  I reduced the hip and he had the same stability parameters.  I lengthened the incision by 1 cm and then incised the fascia to vastus lateralis.  I elevated the muscle up and palpated on the anterior cortex of the femur and visualized it also.  The slit came just to the level of the lesser trochanter.  I felt that for safety purposes that a cable should be placed circumferentially around the femur just below the level of lesser trochanter.  We then placed a Zimmer cable, tightened it up and then cut the cable.  This had  a tight circumferential fit around the femur.  We then thoroughly irrigated the wound and reattached the short rotators and capsule to the femur through drill holes with Ethibond suture.  The fascia of the vastus lateralis was repaired with interrupted #1 Vicryl.  Fascia lata was then closed over Hemovac drain with interrupted #1 Vicryl, subcu closed with #1-0 and #2-0 Vicryl, and subcuticular running 4-0 Monocryl.  The incisions were then cleaned and dried.  Catheter for Marcaine pain pump was placed and pump was initiated.  Drain was hooked to suction and a bulky sterile dressing was placed.  He was then placed into a knee immobilizer, awakened and transported to recovery in stable condition.     Ollen Gross, M.D.     FA/MEDQ  D:  03/30/2011  T:  03/31/2011  Job:  [id number]  Electronically Signed by Ollen Gross M.D. on 04/01/2011 10:17:58 AM

## 2011-04-02 LAB — CBC
HCT: 24.8 % — ABNORMAL LOW (ref 39.0–52.0)
MCV: 86.1 fL (ref 78.0–100.0)
RDW: 13.3 % (ref 11.5–15.5)
WBC: 11.5 10*3/uL — ABNORMAL HIGH (ref 4.0–10.5)

## 2011-04-06 NOTE — H&P (Signed)
NAME:  [name], [name] NO.:  [id number]  MEDICAL RECORD NO.:  [id number]           PATIENT TYPE:  I  LOCATION:  0004                         FACILITY:  [facility]  PHYSICIAN:  Ollen Gross, M.D.    DATE OF BIRTH:  16-Jul-1946  DATE OF ADMISSION:  03/30/2011 DATE OF DISCHARGE:                             HISTORY & PHYSICAL   DATE OF OFFICE VISIT/HISTORY AND PHYSICAL:  March 12, 2011.  DATE OF ADMISSION:  March 30, 2011  CHIEF COMPLAINT:  Right hip pain.  HISTORY OF PRESENT ILLNESS:  The patient is a 65 year old male who has been seen by Dr. Lequita Halt in a second opinion with regards to his right hip.  He has been describing pain in the buttock and groin area that has been progressive over time.  He is an avid Counselling psychologist and still competes. He was initially seen by Dr. Maeola Harman who felt it was coming from his back and actually had back surgery scheduled.  He had a second opinion with another neurosurgery who felt it is not necessarily coming from his back and then he saw Dr. Neil Crouch, question whether it was lumbar mediated.  He has been seen by Dr. Lequita Halt.  He did have an intra- articular hip injection prior to coming over which helped for about one night and then the pain came back.  He has been seen in the office where x-ray showed complete bone-on-bone of the right hip with absolutely no joint space left with osteophyte formation.  It is felt that this is all coming from his hip and felt he would benefit from undergoing hip replacement for the rapidly progressive variety of hip arthritis.  ALLERGIES:  No documented allergies.  CURRENT MEDICATIONS:  Crestor, benazepril, aspirin, fish oil, chondroitin, indomethacin, hydrocodone.  PAST MEDICAL HISTORY:  Past history of stroke x2, 2002, 1995; impaired hearing; impaired vision secondary to stroke; hypertension; hiatal hernial; hemorrhoids; history of bradycardia requiring pacemaker placement; degenerative disk  disease; childhood illnesses of measles, mumps and rubella.  PAST SURGICAL HISTORY:  Gallbladder surgery, pacemaker placement.  FAMILY HISTORY:  Father with heart disease.  Mother with heart disease and cancer.  SOCIAL HISTORY:  Married.  He is an Pensions consultant, past smoker for about 20 years, one to two drinks of alcohol per week.  One child.  He does have caregiver lined up.  Does have living will healthcare power of attorney.  REVIEW OF SYSTEMS:  GENERAL:  No fevers, chills, night sweats.  NEURO: No seizures, syncope or paralysis.  RESPIRATORY:  No shortness breath, productive cough or hemoptysis.  CARDIOVASCULAR:  No chest pain, angina, orthopnea.  GI:  No nausea, vomiting, diarrhea, constipation.  GU:  No dysuria, hematuria, or discharge.  MUSCULOSKELETAL:  Right hip.  PHYSICAL EXAMINATION:  VITAL SIGNS:  Pulse 68, respirations 12, blood pressure 118/72. GENERAL:  A 65 year old white male, well nourished, well developed, in no acute distress.  He is alert, oriented, cooperative, anxious.  Good historian. HEENT:  Normocephalic, atraumatic.  Pupils are round and reactive.  EOMs intact.  Never wear glasses. NECK:  Supple. CHEST:  Clear. HEART:  Regular rate and rhythm without murmur.  S1, S2 noted. ABDOMEN:  Soft, nontender.  Bowel sounds present. RECTAL:  Not done, not pertinent to present illness. BREASTS:  Not done, not pertinent to present illness. GENITALIA:  Not done, not pertinent to present illness. EXTREMITIES:  Right hip, flexion 90, 0 internal rotation, 10 degrees external rotation, 20 degrees abduction.  IMPRESSION:  Osteoarthritis, arthritis right hip.  PLAN:  The patient admitted to [facility], undergo a right total hip replacement arthroplasty.  Surgery will be performed by Dr. Ollen Gross.  He has been seen preoperatively by Dr. Ladona Ridgel and Dr. Cato Mulligan who felt him to be stable for upcoming procedure.     Alexzandrew L. Julien Girt,  P.A.C.   ______________________________ Ollen Gross, M.D.    ALP/MEDQ  D:  03/30/2011  T:  03/30/2011  Job:  [id number]  cc:   Valetta Mole. Swords, MD [address] [place] [address]  Doylene Canning. Ladona Ridgel, MD [address]  [address] [place] [address]  Ollen Gross, M.D. Fax: [phone]  Electronically Signed by Patrica Duel P.A.C. on 04/02/2011 10:08:53 AM Electronically Signed by Ollen Gross M.D. on 04/06/2011 06:47:43 PM

## 2011-04-07 ENCOUNTER — Other Ambulatory Visit: Payer: Self-pay | Admitting: Internal Medicine

## 2011-04-14 NOTE — Assessment & Plan Note (Signed)
[facility]                         ELECTROPHYSIOLOGY OFFICE NOTE   NAME:RICHELSONBenn, Tarver                   MRN:          [id number]  DATE:02/21/2008                            DOB:          07-17-1946    HISTORY:  Clinic note.  Mr. Laton returns today for followup.  He is  a very pleasant middle-aged man with a history of sinus node dysfunction  status post pacemaker insertion.  The patient continues as a Physicist, medical and has unfortunately continued to lose time to the clock over  the years.  He notes that his dyspnea is worse and that he is more short  of breath and fatigued when he is swimming.  He also notes that he is  unable to train as hard and is concerned about his other problems going  on.  He denies chest pain.   PHYSICAL EXAMINATION:  GENERAL:  On physical exam he is a pleasant well-  appearing man in no distress.  VITAL SIGNS:  Blood pressure 122/74, the pulse 64 and regular,  respirations were 18.  The weight was 160 pounds.  NECK:  Revealed no jugular venous distention.  LUNGS:  Clear bilaterally to auscultation.  No wheezes, rales or rhonchi  are present.  CARDIOVASCULAR:  Exam revealed regular rate and rhythm with normal S1  and S2.  There is a physiologic splitting of the S2.  EXTREMITIES:  Demonstrated no edema.  ABDOMEN:  Soft, nontender, nondistended.  There was organomegaly.   MEDICATIONS:  1. Include simvastatin 20 a day.  2. Lotensin 10 a day.  3. Aspirin 325 a day.   Interrogation of his pacemaker demonstrates a Medtronic Vitatron dual  chamber device with P waves of 4 and R waves of 12, the impedance 600 in  the A, 550 in the V.  The threshold 0.65 at 0.4 in the atrium and 1.4 in  the RV.  Battery voltage was good.  His AV lead was set at 250.   IMPRESSION:  1. Symptomatic bradycardia.  2. Status post pacemaker insertion.  3. Persistent dyspnea with exertion.   DISCUSSION:  I have recommended that  the patient undergo exercise  cardiopulmonary stress testing to try to better see what his functional  limitations are with regard to exercise.  We will make additional  recommendations based on the results of the cardiopulmonary stress test.     Doylene Canning. Ladona Ridgel, MD  Electronically Signed    GWT/MedQ  DD: 02/21/2008  DT: 02/22/2008  Job #: [id number]   cc:   Valetta Mole. Swords, MD

## 2011-04-14 NOTE — Assessment & Plan Note (Signed)
[facility]                         ELECTROPHYSIOLOGY OFFICE NOTE   NAME:RICHELSONHurley, Higgins                   MRN:          [id number]  DATE:10/30/2008                            DOB:          [birth year]    [name] returns today for followup.  He is a very pleasant middle-  aged man with a history of sinus node dysfunction.  He has a history of  symptomatic bradycardia.  He has a history of hypertension and  dyslipidemia.  He is followed by Dr. 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  We have had multiple adjustments with the rate to response of his  pacemaker.  A cardiopulmonary stress test was done several months ago,  which demonstrated that as soon as he started V pacing, his cardiac  output went down and the patient had to slow down and stop what he was  doing.  As a consequence, we reprogrammed him to the AAIR mode of pacing  and he returns today for followup.  He notes now that his heart rates  with extreme exertion get up to 140 or even 150 beats per minute at  their maximum.  He feels relatively well, some days are better than  others.   MEDICATIONS:  1. Aspirin 325 a day.  2. Lotensin 10 a day.  3. Crestor 5 a day.  4. Fish oil supplement.  5. Multiple vitamins.   PHYSICAL EXAMINATION:  GENERAL:  He is a pleasant very well-appearing  middle-aged man in no acute distress.  VITAL SIGNS:  The blood pressure was 130/74, the pulse 68 and regular,  the respirations were 18, and the weight was 163 pounds.  NECK:  No jugular venous distention.  LUNGS:  Clear bilaterally to auscultation.  No wheezes, rales, or  rhonchi are present.  There is no increased work of breathing.  CARDIOVASCULAR:  Regular rate and rhythm.  Normal S1 and S2.  ABDOMEN:  Soft and nontender.  EXTREMITIES:  No edema.   Interrogation of his pacemaker demonstrated a Vitatron-programmed AAIR.  The  lower rate is 60 and upper rate of 170.  P-waves were greater than  4, the impedance 600, and the threshold is 0.65 at 0.4.   IMPRESSION:  1. Symptomatic bradycardia.  2. Status post pacemaker insertion.  3. Hypertension.  4. Dyslipidemia.   DISCUSSION:  Mr. Spaugh's pacemaker is working normally.  Battery is  still good at 6 years out.  The patients blood pressure is well  controlled.  He is followed by Dr. Cato Mulligan for his lipids, but remains on  Crestor.  I will see the patient back for followup of his pacemaker in 1  year.  He will do our [facility] program in 6 months for followup.  I have  asked him to call us if there are any changes in his cardiac status.  Today, we spent a considerable amount of time with regard to helping  optimize his swimming performance considering the implementation of a  physical trainer to help him improve his core strength.  Doylene Canning. Ladona Ridgel, MD  Electronically Signed    GWT/MedQ  DD: 10/30/2008  DT: 10/31/2008  Job #: [id number]

## 2011-04-14 NOTE — Assessment & Plan Note (Signed)
[facility]                         ELECTROPHYSIOLOGY OFFICE NOTE   NAME:RICHELSONJarnell, Cordaro                   MRN:          [id number]  DATE:04/09/2008                            DOB:          01/23/1946    Mr. Smolinsky returns today for followup.  He is a very pleasant middle-  age man with sinus node dysfunction which is fairly profound who swims  competitively.  I saw him back in March, and at that time he was  complaining of increasing exertional dyspnea and fatigue.  This was  predominantly when he was exerting himself while swimming.  He notes his  times were decreased as well.  At rest, he had no symptoms.  He  subsequently was referred for cardiopulmonary stress testing which,  interestingly enough, demonstrated that when he reached heart rates of  110 beats per minute, he began to V pace almost exclusively, and when  this occurred, his cardiac output, as measured by stroke volume, went  down.  He returns today for followup.  Otherwise, he had no specific  complaints.   MEDICATIONS:  1. Aspirin 325 a day.  2. Lotensin 10 mg daily.  3. Crestor 10 a day.  4. Chondroitin.   PHYSICAL EXAMINATION:  GENERAL:  He is a pleasant, well-appearing,  middle-age man in no acute distress.  VITAL SIGNS:  Blood pressure today was 137/82k the pulse 68 and regular.  Respirations were 18.  The weight was 158 pounds.  NECK:  Revealed no jugular distention.  LUNGS:  Clear bilaterally to auscultation.  No wheezes, rales or rhonchi  are present.  CARDIOVASCULAR:  Exam revealed a regular rate and rhythm with normal S1-  S2.  EXTREMITIES:  Demonstrated no edema.   Interrogation of his pacemaker demonstrates a Medtronic Vitatron device.  Review of the patient's histograms demonstrates that, with the sensor on  utilizing both the QT the accelerometer, the patient begins to pace at  about 110 beats per minute.  We have tried to reprogram this device to  minimize ventricular pacing, and at this point we are unable to do so.  For this reason, we have changed his rate response to AAIR and turned  the accelerator only on to prevent ventricular pacing.  We will see him  back in the office in several months to see how he is doing.   IMPRESSION:  1. Symptomatic bradycardia.  2. Status post pacemaker insertion.  3. Severe fatigue with exertion, probably related to ventricular      pacing.   I will plan to see the patient back in the office in several months.     Doylene Canning. Ladona Ridgel, MD  Electronically Signed    GWT/MedQ  DD: 04/09/2008  DT: 04/09/2008  Job #: [id number]

## 2011-04-17 NOTE — Assessment & Plan Note (Signed)
[facility]                         ELECTROPHYSIOLOGY OFFICE NOTE   NAME:[name], [name]                   MRN:          [id number]  DATE:02/09/2007                            DOB:          July 22, 1946    Mr. Nehme returns today for follow-up.  He is a very pleasant 65-  year-old male with severe sinus bradycardia and sinus node dysfunction,  who has an underlying sinus rate of less than 30 beats per minute.  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  He has palpitations at times and notes that when he exerts  himself sometimes he feels like his heart is beating too fast.  At other  times he feels like he is not achieving the heart rate that he would  like.  He has very vague sensations in his chest.  He has an unusual  taste in his mouth and overall multiple complaints, which are all very  nonspecific.   PHYSICAL EXAMINATION:  GENERAL:  On exam, he is a pleasant, well-  appearing 65 year old man in no acute distress.  VITAL SIGNS:  The blood pressure was 143/96, the pulse was 84 and  regular, the respirations were 18, the weight was 167 pounds.  NECK:  No jugular venous distention.  LUNGS:  Clear bilaterally to auscultation.  There were no wheezes, rales  or rhonchi.  CARDIOVASCULAR:  He had a regular rate and rhythm with a normal S1 and  S2.  EXTREMITIES:  No cyanosis, clubbing or edema.   His EKG demonstrates atrial pacing.   Interrogation of his pacemaker demonstrates a Scientific laboratory technician  with no P waves.  R waves were greater than 12, the impedance 550 in the  atrium, 550 in the ventricle and a threshold of 0.6 V at 0.4 msec and 1  V at 0.4 msec in the right ventricle.   IMPRESSION:  1. Symptomatic bradycardia.  2. Status post pacemaker insertion.   DISCUSSION:  Overall Mr. Mcmains is stable.  We will plan to see  him  back in a year.     Doylene Canning. Ladona Ridgel, MD  Electronically Signed    GWT/MedQ  DD: 02/09/2007  DT: 02/11/2007  Job #: [id number]

## 2011-04-17 NOTE — Op Note (Signed)
NAME:  KEYMANI, MCLEAN                      ACCOUNT NO.:  [id number]   MEDICAL RECORD NO.:  [id number]                   PATIENT TYPE:  OIB   LOCATION:  4707                                 FACILITY:  [facility]   PHYSICIAN:  Doylene Canning. Ladona Ridgel, M.D. [facility]           DATE OF BIRTH:  24-Feb-1946   DATE OF PROCEDURE:  09/15/2002  DATE OF DISCHARGE:  09/16/2002                                 OPERATIVE REPORT   PROCEDURE:  Implantation of a dual chamber pacemaker utilizing fluoroscopy.   INDICATIONS FOR PROCEDURE:  Symptomatic bradycardia with sick sinus syndrome  and documented heart rates during the daytime hours of less than 30 beats  per minute.   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 He is now referred for permanent  pacemaker insertion. Because of the patient's need for physiologic sensing  secondary to his high level of activity and breath-holding, he will receive  a dual chamber device with QT interval rate response features.   DESCRIPTION OF PROCEDURE:  After informed consent was obtained, the patient  was taken to the diagnostic EP lab in the fasted state.  After the usual  preparation and draping, a total of 30 cc of lidocaine was infiltrated into  the left infraclavicular region. A 6 cm incision was carried out over this  region, and electrocautery utilized to dissect down to the subpectoralis  fascia.  10 cc of contrast was injected into the left upper extremity venous  system demonstrating a patent left subclavian vein. It was subsequently  punctured and the Medtronic 5076 58 cm active fixation lead was placed in  the right ventricle.  The lead serial number was [id number].  The Medtronic  model 5076 52 cm active fixation lead with serial number [id number] was  placed in the right atrium.  Mapping was carried out at the right  ventricle  and at the RV apex R waves measured 11 millivolts and the pacing threshold  was 0.6 volts at 0.5 milliseconds with a pacing impedence of 699 ohms after  the lead was actively fixed.  10-volt pacing did not result in diaphragmatic  stimulation.  With the ventricular lead in satisfactory position, attention  was turned to the atrial lead. It was subsequently maneuvered into the right  atrial appendage where P waves measured 2.5 to 3 millivolts.  Active lead  was actively fixed. The patient's threshold was 1 volt at 0.5 milliseconds.  The patient's impedence was 669 ohms.  Again 10-volt pacing did not result  in diaphragmatic stimulation. The leads were subsequently secured to the  subpectoralis fascia with a figure-of-eight silk suture. In addition, the  sew-in sleeves were secured with silk suture. Electrocautery was then  utilized to make a subcutaneous pocket.  The Medtronic Vitatron Clarity DDDR  dual chamber pacemaker model number [id number], serial  number [id number] dual  chamber blended rate-responsive pacemaker was connected to the atrial and  ventricular pacing leads and placed in the subcutaneous pocket. Kanamycin  irrigation was utilized to irrigate the pocket before and after device  insertion. Electrocautery was utilized to ensure hemostasis. The generator  was secured with a silk suture. The incision was closed with a layer of 2-0  Vicryl followed by a layer of 3-0 Vicryl followed by a layer of 4-0 Vicryl.  Benzoin was painted on the skin, Steri-Strips were applied, and a pressure  dressing placed, and the patient subsequently returned to his room in  satisfactory condition.   COMPLICATIONS:  There were no immediate procedure complications.    RESULTS:  This demonstrates successful implantation of a Medtronic Network engineer in a patient with symptomatic bradycardia, sick sinus  syndrome, and the need for QT physiologic rate response.                                                Doylene Canning. Ladona Ridgel, M.D. [facility]    GWT/MEDQ  D:  09/15/2002  T:  09/16/2002  Job:  [id number]   cc:   Charlies Constable, MD [facility]  [address]  [place]  [place] [address]   Valetta Mole. Swords, M.D. [facility]   Kathrine Cords, R.N. [facility]  [address]  [place], [place] [address]  Fax: [phone]

## 2011-04-17 NOTE — Discharge Summary (Signed)
   NAME:  [name], [name]                      ACCOUNT NO.:  [id number]   MEDICAL RECORD NO.:  [id number]                   PATIENT TYPE:  OIB   LOCATION:  4707                                 FACILITY:  [facility]   PHYSICIAN:  Doylene Canning. Ladona Ridgel, M.D. [facility]           DATE OF BIRTH:  06-Jan-1946   DATE OF ADMISSION:  09/15/2002  DATE OF DISCHARGE:  09/16/2002                                 DISCHARGE SUMMARY   DISCHARGE DIAGNOSES:  1. Symptomatic bradycardia, status post permanent pacemaker implantation.  2. Optic neuritis.  3. Hypertension.   HISTORY OF PRESENT ILLNESS:  The patient is a 65 year old male who was seen  in the office on September 01, 2002, by Gregg W. Ladona Ridgel, M.D.  He reviewed the  Holter monitor, which showed heart rates down into the low 20s.  Given that  the patient had some mild symptoms, as well as the fact that he felt that  the patient had _______________ incompetence, Dr. Ladona Ridgel arranged for the  patient to get a permanent pacemaker.   HOSPITAL COURSE:  On September 15, 2002, Dr. Ladona Ridgel took the patient to the  electrophysiology lab.  He successfully implanted a Medtronic Clarity  pacemaker.  There were no immediate complications.   The next day, the pacemaker was interrogated by Medtronic.  It was  functioning well.  The dressing was dry and nontender.  The chest x-ray was  reviewed and the pacemaker appeared to be in the correct locations.  As a  result, he was felt to be ready for discharge.   DISCHARGE MEDICATIONS:  1. Lipitor 20 mg q.p.m.  2. Enteric-coated aspirin 325 mg q.d.  3. Naprosyn as needed.  4. Tylenol 650 mg every four to six hours as needed for pain.   DIET:  The patient is to follow a low-fat, low-cholesterol diet.   SPECIAL INSTRUCTIONS:  He was given the standard supplement discharge  instructions for pacemaker patents.   FOLLOW-UP:  He is to follow up with a pacer clinic visit on September 28, 2002, at 9:15 a.m.  He is to follow up with Doylene Canning. Ladona Ridgel, M.D., in  approximately three months and the office will call him with the date and  time.     Annett Fabian, P.A. [facility]                  Doylene Canning. Ladona Ridgel, M.D. [facility]    CKM/MEDQ  D:  09/16/2002  T:  09/17/2002  Job:  [id number]

## 2011-04-17 NOTE — Op Note (Signed)
NAME:  [name], [name]                      ACCOUNT NO.:  [id number]   MEDICAL RECORD NO.:  [id number]                   PATIENT TYPE:  OBV   LOCATION:  0373                                 FACILITY[facility]  [facility]   PHYSICIAN:  Angelia Mould. Derrell Lolling, M.D.             DATE OF BIRTH:  [birth year]   DATE OF PROCEDURE:  02/07/2003  DATE OF DISCHARGE:                                 OPERATIVE REPORT   PREOPERATIVE DIAGNOSIS:  Acute and chronic cholecystitis with  cholelithiasis.   POSTOPERATIVE DIAGNOSIS:  Acute and chronic cholecystitis with  cholelithiasis.   OPERATION:  Laparoscopic cholecystectomy with intraoperative cholangiogram.   SURGEON:  Angelia Mould. Derrell Lolling, M.D.   FIRST ASSISTANT:  Lebron Conners, M.D.   INDICATIONS FOR PROCEDURE:  This is a 65 year old white male who I saw back  in November of 2003. At that time, he was time he was having recurrent  episodes of biliary colic. A gallbladder ultrasound had showed a thick  walled gallbladder and multiple gallstones and slightly elevated liver  function tests. He was advised to have cholecystectomy. He declined to have  surgery until now. He has had one or two other attacks and now has requested  cholecystectomy.   FINDINGS:  The gallbladder was severely inflamed. It was markedly thick  walled, markedly fibrotic, contained huge gallstones and was very difficult  to dissect from the liver. The cystic duct and common bile duct were  somewhat dilated but the cholangiogram showed no filling defects, normal  anatomy and no obstruction with good flow of contrast into the duodenum. The  liver, stomach, duodenum, small intestine and large intestine were grossly  normal to inspection. There were some adhesions to the right mid abdominal  wall which were fairly soft and chronic and easily taken down.   TECHNIQUE:  Following the induction of general endotracheal anesthesia, the  patient's abdomen was prepped and draped in a sterile fashion. A  vertical  incision was made inside the lower rim of the umbilicus. The fascia was  incised in the midline and the abdominal cavity entered under direct vision.  A 10 mm Hasson trocar was inserted and secured with a pursestring suture of  #0 Vicryl.  Pneumoperitoneum was created. The cannula was  inserted with  visualization and findings as described above. A 10 mm trocar was placed in  the subxiphoid region and two 5 mm trocars placed in the mid abdomen. We  took adhesions down with sharp scissor dissection and electrocautery. We  exposed the body and infundibulum of the gallbladder. Very carefully we had  to dissect out the infundibulum of the gallbladder, the cystic duct and the  cystic artery. We isolated each structure. We isolated the cystic artery as  it went on the wall of the gallbladder, secured it metal clips and divided  it. We created a large window behind the cystic duct and mobilized the  infundibulum of the gallbladder until we could  pull the cystic duct  transversely. A metal clip was placed on the cystic duct close to the  gallbladder. A cholangiogram catheter was inserted into the cystic duct.  Cholangiogram was obtained using the C-arm. This showed slightly dilated  bile ducts but prompt flow of contrast into the duodenum, no obstruction, no  filling defect, and the anatomy of the common bile duct, common hepatic  duct, right and left hepatic ducts looked normal. The cholangiogram catheter  was removed. The cystic duct was secured with metal clips and divided. The  gallbladder was dissected from its bed with electrocautery and scissor  dissection and placed in a specimen bag and removed. This was a somewhat  tedious and lengthy dissection because of the intense fibrosis and chronic  inflammation.   After removing the gallbladder, we inspected the area of dissection.  Hemostasis in the bed of the gallbladder was excellent and achieved with  electrocautery. The irrigation  fluid was completely clear. There was on bile  leak whatsoever. The trocars were removed under direct vision and there was  on bleeding from the trocar sites. Pneumoperitoneum was released. The fascia  at the umbilicus was closed with #0 Vicryl sutures. The skin incisions were  closed with subcuticular sutures of 4-0 Vicryl and Steri-Strips. Clean  bandages were placed and the patient taken to the recovery room in stable  condition. Estimated blood loss was about 40-50 mL.  Complications none.  Sponge, needle and instrument counts were correct.                                                Angelia Mould. Derrell Lolling, M.D.    HMI/MEDQ  D:  02/07/2003  T:  02/07/2003  Job:  [id number]   cc:   Valetta Mole. Swords, M.D. [facility]   Doylene Canning. Ladona Ridgel, M.D. [facility]

## 2011-04-22 ENCOUNTER — Ambulatory Visit: Payer: BC Managed Care – PPO | Attending: Orthopedic Surgery

## 2011-04-22 DIAGNOSIS — IMO0001 Reserved for inherently not codable concepts without codable children: Secondary | ICD-10-CM | POA: Insufficient documentation

## 2011-04-22 DIAGNOSIS — M25659 Stiffness of unspecified hip, not elsewhere classified: Secondary | ICD-10-CM | POA: Insufficient documentation

## 2011-04-22 DIAGNOSIS — M25559 Pain in unspecified hip: Secondary | ICD-10-CM | POA: Insufficient documentation

## 2011-04-22 DIAGNOSIS — R269 Unspecified abnormalities of gait and mobility: Secondary | ICD-10-CM | POA: Insufficient documentation

## 2011-04-22 DIAGNOSIS — M6281 Muscle weakness (generalized): Secondary | ICD-10-CM | POA: Insufficient documentation

## 2011-04-22 NOTE — Discharge Summary (Signed)
NAME:  [name], [name] NO.:  [id number]  MEDICAL RECORD NO.:  [id number]           PATIENT TYPE:  I  LOCATION:  1620                         [facility]AC[facility]  PHYSICIAN:  Ollen Gross, M.D.    DATE OF BIRTH:  [birth year]  DATE OF ADMISSION:  03/30/2011 DATE OF DISCHARGE:  04/02/2011                              DISCHARGE SUMMARY   ADMITTING DIAGNOSES: 1. Osteoarthritis, right hip. 2. Past history of stroke x2. 3. Impaired hearing. 4. Impaired vision secondary to stroke. 5. Hypertension. 6. Hiatal hernia. 7. Hemorrhoids. 8. Past history of bradycardia requiring pacemaker placement. 9. Degenerative disk disease. 10.Childhood illnesses of measles, mumps and rubella.  DISCHARGE DIAGNOSES: 1. Osteoarthritis, right hip, status post right total hip replacement     arthroplasty. 2. Postoperative acute blood loss anemia, did not require transfusion. 3. Past history of stroke x2. 4. Impaired hearing. 5. Impaired vision secondary to stroke. 6. Hypertension. 7. Hiatal hernia. 8. Hemorrhoids. 9. Past history of bradycardia requiring pacemaker placement. 10.Degenerative disk disease. 11.Childhood illnesses of measles, mumps and rubella.  PROCEDURE:  March 30, 2011, right total hip.  Surgeon, Dr. Lequita Halt. Assistant, Alexzandrew L. Perkins, P.A.C. Anesthesia was spinal.  CONSULTS:  None.  BRIEF HISTORY:  [name] is a 65 year old male with end-stage arthritis of the right hip with progressive worsening pain and dysfunction, has failed nonoperative management, now presents for total hip arthroplasty.  LABORATORY DATA:  The admission CBC which was done preoperatively is not scanned in the chart, not available.  Serial CBCs were followed. Hemoglobins was 10.1 on day #1, 10.2 on postop day #2 and it got as low as 8.4 on postop day #3, was asymptomatic at time of discharge.  White count went up from 11.4 on day #1 to 12.8 on day #2 but back down to 11.5.   Chem panel on admission was not scanned in the chart, not available at time of dictation.  Serial BMETs were followed. Electrolytes remained within normal limits.  Glucose was noted be a little elevated on day #1, was 163 but back down to 122 on day #2. Blood group type O positive.  X-RAYS:  Postop hip film and pelvis film on March 30, 2011, right total hip arthroplasty.  No complicating features.  HOSPITAL COURSE:  The patient admitted to [facility], taken to OR, underwent above-stated procedure without complications.  The patient tolerated the procedure well, later transferred to the recovery room on orthopedic floor, started on p.o. and IV analgesic for pain control following surgery, was started back on his home meds.  He had a fair amount of pain through the night, was quite anxious.  He was started on an Ativan protocol but we wanted to avoid the sedation so we switched his Ativan to an IV or p.r.n. protocol, not a standard dosing.  He did a little bit better through that afternoon.  He started getting up out of bed.  By day #2, his pain was still moderate but slowly improving.  We changed the dressing on day #2 and his incision looked good.  His hemoglobin was 10.2, he was asymptomatic with this.  He  continued to progress with therapy.  By day #3, he was feeling much better and his pain was under better control and wanted to go home.  He was seen on rounds by Dr. Lequita Halt.  His incision was healing well, no problems and he was discharged at that time.  DISCHARGE/PLAN: 1. The patient was discharged home Apr 02, 2011. 2. Discharge diagnoses, please see above. 3. Discharge meds.  Current medications at the time of transfer include: 1. Nu-Iron. 2. Robaxin. 3. Percocet. 4. Xarelto. 5. Temazepam.  Continue home meds of Afrin, benazepril and Crestor.  DIET:  Heart-healthy diet.  ACTIVITY:  Partial weightbearing 25-50% on right lower extremity.  Hip precaution, total  hip protocol.  Follow up in 2 weeks.  DISPOSITION:  Home.  CONDITION ON DISCHARGE:  Improved.     Alexzandrew L. Julien Girt, P.A.C.   ______________________________ Ollen Gross, M.D.    ALP/MEDQ  D:  04/16/2011  T:  04/16/2011  Job:  [id number]  cc:   Ollen Gross, M.D. Fax: [phone]  Valetta Mole. Swords, MD [address] [place] [address]  Doylene Canning. Ladona Ridgel, MD [address]  [address] [place] [address]  Electronically Signed by Patrica Duel P.A.C. on 04/20/2011 11:06:04 AM Electronically Signed by Ollen Gross M.D. on 04/22/2011 12:52:47 PM

## 2011-04-24 ENCOUNTER — Ambulatory Visit: Payer: BC Managed Care – PPO

## 2011-04-28 ENCOUNTER — Ambulatory Visit: Payer: BC Managed Care – PPO | Admitting: Physical Therapy

## 2011-04-30 ENCOUNTER — Ambulatory Visit: Payer: BC Managed Care – PPO | Admitting: Physical Therapy

## 2011-05-06 ENCOUNTER — Ambulatory Visit: Payer: BC Managed Care – PPO | Attending: Orthopedic Surgery

## 2011-05-06 DIAGNOSIS — M6281 Muscle weakness (generalized): Secondary | ICD-10-CM | POA: Insufficient documentation

## 2011-05-06 DIAGNOSIS — M25559 Pain in unspecified hip: Secondary | ICD-10-CM | POA: Insufficient documentation

## 2011-05-06 DIAGNOSIS — M25659 Stiffness of unspecified hip, not elsewhere classified: Secondary | ICD-10-CM | POA: Insufficient documentation

## 2011-05-06 DIAGNOSIS — IMO0001 Reserved for inherently not codable concepts without codable children: Secondary | ICD-10-CM | POA: Insufficient documentation

## 2011-05-06 DIAGNOSIS — R269 Unspecified abnormalities of gait and mobility: Secondary | ICD-10-CM | POA: Insufficient documentation

## 2011-05-11 ENCOUNTER — Ambulatory Visit: Payer: BC Managed Care – PPO

## 2011-05-14 ENCOUNTER — Ambulatory Visit: Payer: BC Managed Care – PPO

## 2011-05-25 ENCOUNTER — Ambulatory Visit: Payer: BC Managed Care – PPO | Admitting: Physical Therapy

## 2011-05-26 ENCOUNTER — Other Ambulatory Visit (INDEPENDENT_AMBULATORY_CARE_PROVIDER_SITE_OTHER): Payer: Self-pay | Admitting: Surgery

## 2011-05-26 ENCOUNTER — Encounter (HOSPITAL_COMMUNITY): Payer: BC Managed Care – PPO

## 2011-05-26 LAB — BASIC METABOLIC PANEL
BUN: 21 mg/dL (ref 6–23)
Chloride: 102 mEq/L (ref 96–112)
GFR calc non Af Amer: >60 mL/min (ref >60)
Glucose, Bld: 99 mg/dL (ref 70–99)
Potassium: 4.1 mEq/L (ref 3.5–5.1)

## 2011-05-26 LAB — CBC
Hemoglobin: 12.9 g/dL — ABNORMAL LOW (ref 13.0–17.0)
MCH: 27.8 pg (ref 26.0–34.0)
MCHC: 32 g/dL (ref 30.0–36.0)

## 2011-05-26 LAB — PROTIME-INR: Prothrombin Time: 13.9 seconds (ref 11.6–15.2)

## 2011-05-27 ENCOUNTER — Ambulatory Visit: Payer: BC Managed Care – PPO

## 2011-05-29 ENCOUNTER — Ambulatory Visit (HOSPITAL_COMMUNITY)
Admission: RE | Admit: 2011-05-29 | Discharge: 2011-05-29 | Disposition: A | Discharge status: Home / Self Care | Payer: BC Managed Care – PPO | Source: Ambulatory Visit | Attending: Surgery | Admitting: Surgery

## 2011-05-29 DIAGNOSIS — K4090 Unilateral inguinal hernia, without obstruction or gangrene, not specified as recurrent: Secondary | ICD-10-CM

## 2011-05-29 DIAGNOSIS — Z950 Presence of cardiac pacemaker: Secondary | ICD-10-CM | POA: Insufficient documentation

## 2011-05-29 DIAGNOSIS — I10 Essential (primary) hypertension: Secondary | ICD-10-CM | POA: Insufficient documentation

## 2011-05-29 DIAGNOSIS — Z01812 Encounter for preprocedural laboratory examination: Secondary | ICD-10-CM | POA: Insufficient documentation

## 2011-05-29 DIAGNOSIS — D176 Benign lipomatous neoplasm of spermatic cord: Secondary | ICD-10-CM | POA: Insufficient documentation

## 2011-05-29 DIAGNOSIS — Z8673 Personal history of transient ischemic attack (TIA), and cerebral infarction without residual deficits: Secondary | ICD-10-CM | POA: Insufficient documentation

## 2011-06-08 ENCOUNTER — Ambulatory Visit (INDEPENDENT_AMBULATORY_CARE_PROVIDER_SITE_OTHER): Payer: BC Managed Care – PPO | Admitting: Surgery

## 2011-06-08 ENCOUNTER — Encounter (INDEPENDENT_AMBULATORY_CARE_PROVIDER_SITE_OTHER): Payer: Self-pay | Admitting: Surgery

## 2011-06-08 VITALS — HR 68 | Temp 96.4°F | Resp 12

## 2011-06-08 DIAGNOSIS — K4090 Unilateral inguinal hernia, without obstruction or gangrene, not specified as recurrent: Secondary | ICD-10-CM

## 2011-06-08 HISTORY — PX: OTHER SURGICAL HISTORY: SHX169

## 2011-06-08 NOTE — Progress Notes (Signed)
HISTORY: Patient returns for followup having undergone left inguinal hernia repair with mesh 10 days ago.   PERTINENT REVIEW OF SYSTEMS: The patient notes incisional pain which is slowly improving. He's had no drainage. He does have a stinging sensation at the lateral aspect of the wound with coughing or sneezing.   EXAM: Left inguinal wound is healing without complication. Steri-Strips are removed. Mild to moderate soft tissue swelling. No sign of infection. No sinus neuroma. With cough and Valsalva there is no sign of recurrent hernia.   IMPRESSION: Left inguinal hernia, reducible,now repaired with mesh without evident complication.   PLAN: Patient will return for wound check in 6 weeks. He is restricted to 20 pounds lifting. He will begin applying topical creams to his incision.

## 2011-06-09 NOTE — Op Note (Signed)
NAME:  [name], [name] NO.:  [id number]  MEDICAL RECORD NO.:  [id number]  LOCATION:  DAYL                         FACILITY:  [facility]  PHYSICIAN:  Velora Heckler, MD      DATE OF BIRTH:  1946/09/14  DATE OF PROCEDURE:  05/29/2011                               OPERATIVE REPORT   PREOPERATIVE DIAGNOSIS:  Left inguinal hernia, reducible.  POSTOPERATIVE DIAGNOSIS:  Left inguinal hernia, reducible.  PROCEDURE:  Repair of left inguinal hernia with Parietex ProGrip mesh.  SURGEON:  Velora Heckler, MD, FACS  ANESTHESIA:  General per Dr. Quentin Cornwall. Council Mechanic, M.D.  ESTIMATED BLOOD LOSS:  Minimal.  PREPARATION:  ChloraPrep.  COMPLICATIONS:  None.  INDICATIONS:  The patient is a 65 year old white male with a left inguinal hernia diagnosed approximately 2 years prior to surgery.  This has gradually increased in size.  He has some discomfort on prolonged standing and with physical activity.  Hernia has always been reducible. The patient now comes to surgery for repair.  BODY OF REPORT:  Procedure was done in OR #11 at the [facility].  The patient was brought to the operating room, placed in supine position on the operating room table.  Following administration of general anesthesia, the patient was positioned and then prepped and draped in the usual strict aseptic fashion.  After ascertaining that an adequate level of anesthesia had been achieved, a left groin incision was made with a #15 blade.  Dissection was carried through subcutaneous tissues and hemostasis obtained with electrocautery.  External oblique fascia was incised in line with its fibers and extended through the external inguinal ring.  Cord structures were dissected out of the inguinal canal and encircled with a Penrose drain.  Floor of the inguinal canal was dissected out.  Fascial plane was developed beneath the external oblique circumferentially.  Cord was explored and there was a  small lipoma of the cord which was dissected out up to the level of the internal inguinal ring.  A high ligation was performed with the 2-0 silk suture ligature and the lipoma was excised and discarded.  An indirect inguinal hernia sac was identified.  It was dissected out up to the level of the internal inguinal ring.  Sac was opened and a small amount of adipose tissue was reduced back within the peritoneal cavity.  A high ligation of the sac was performed with a 2-0 silk suture ligature and the sac was excised and discarded.  The floor of the inguinal canal was then reinforced with a sheet of a Parietex ProGrip mesh.  Mesh was secured to the pubic tubercle with a 2- 0 Novafil suture.  Mesh was deployed throughout the floor of the inguinal canal with broad overlap.  Mesh was secured to the inguinal ligament with 2 interrupted 2-0 Novafil sutures.  Local field block was placed with Exparel local anesthetic.  Cord structures were returned to the inguinal canal.  External oblique fascia was closed with interrupted 3-0 Vicryl sutures.  Subcutaneous tissues were closed with interrupted 3- 0 Vicryl sutures.  Skin was anesthetized with local Exparel anesthetic. Skin was closed with running 4-0 Vicryl subcuticular suture.  Wound was washed  and dried, and Benzoin and Steri-Strips were applied.  Sterile dressings were applied.  The patient was awakened from anesthesia and brought to the recovery room.  The patient tolerated the procedure well.   Velora Heckler, MD, FACS     TMG/MEDQ  D:  05/29/2011  T:  05/29/2011  Job:  [id number]  cc:   Doylene Canning. Ladona Ridgel, MD [address]  [address] [place] [address]  Ollen Gross, M.D. Fax: [phone]  Valetta Mole. Swords, MD [address] [place] [address]  Velora Heckler, MD [phone] [address] [place] [address]  Electronically Signed by Darnell Level MD on 06/09/2011 11:00:58 AM

## 2011-06-11 ENCOUNTER — Ambulatory Visit (INDEPENDENT_AMBULATORY_CARE_PROVIDER_SITE_OTHER): Payer: BC Managed Care – PPO | Admitting: Internal Medicine

## 2011-06-11 ENCOUNTER — Encounter: Payer: Self-pay | Admitting: Internal Medicine

## 2011-06-11 DIAGNOSIS — I10 Essential (primary) hypertension: Secondary | ICD-10-CM

## 2011-06-11 DIAGNOSIS — I495 Sick sinus syndrome: Secondary | ICD-10-CM

## 2011-06-11 DIAGNOSIS — Z950 Presence of cardiac pacemaker: Secondary | ICD-10-CM

## 2011-06-11 LAB — PACEMAKER DEVICE OBSERVATION
AL THRESHOLD: 0.5 V
ATRIAL PACING PM: 98
BAMS-0001: 160 {beats}/min
BAMS-0002: 5 ms
DEVICE MODEL PM: 66063261
RV LEAD THRESHOLD: 1 V

## 2011-06-11 NOTE — Patient Instructions (Signed)
Your physician wants you to follow-up in: Feb 2013 You will receive a reminder letter in the mail two months in advance. If you don't receive a letter, please call our office to schedule the follow-up appointment.

## 2011-06-13 ENCOUNTER — Encounter: Payer: Self-pay | Admitting: Internal Medicine

## 2011-06-13 NOTE — Progress Notes (Signed)
HPI [name] returns today for followup. He is a 65 yo man with symptomatic bradycardia, s/p PPM. He has undergone PPM. He denies c/p, sob, or peripheral edema. He has been fairly sedentary as he has undergone hip and hernia surgery.   No Known Allergies   Current Outpatient Prescriptions  Medication Sig Dispense Refill  . aspirin 325 MG EC tablet Take 325 mg by mouth daily.        . benazepril (LOTENSIN) 5 MG tablet TAKE ONE TABLET BY MOUTH EVERY DAY  90 tablet  1  . fish oil-omega-3 fatty acids 1000 MG capsule Take 2 g by mouth daily.        . indomethacin (INDOCIN) 25 MG capsule Take 25 mg by mouth daily.       . Multiple Vitamin (MULTIVITAMIN) tablet Take 1 tablet by mouth daily.        . rosuvastatin (CRESTOR) 20 MG tablet Take 5 mg by mouth daily. Take 1/4 tablet to make 5 mg daily.      Marland Kitchen zolpidem (AMBIEN) 5 MG tablet Take 1 tablet (5 mg total) by mouth at bedtime as needed.  30 tablet  1   No current facility-administered medications for this visit.   Facility-Administered Medications Ordered in Other Visits  Medication Dose Route Frequency Provider Last Rate Last Dose  . DOBUTamine (DOBUTREX) 1,000 mcg/mL in sodium chloride 0.9 % 150 mL infusion  40 mcg/kg Intravenous Continuous [name], MD         Past Medical History  Diagnosis Date  . HTN (hypertension)   . CVA (cerebral vascular accident)   . Ischemic optic neuropathy   . Other and unspecified hyperlipidemia   . Bradycardia     ROS:   All systems reviewed and negative except as noted in the HPI.   Past Surgical History  Procedure Date  . Cardiac catheterization 1996  . Cholecystectomy 02/07/2003  . Colonoscopy 06/23/2004  . Pacemaker insertion   . Eye surgery     left eye     Family History  Problem Relation Age of Onset  . Cancer Mother     lung  . Heart attack Mother   . Hypertension Mother   . Heart attack Father   . Heart disease Father   . Diabetes Father      History   Social  History  . Marital Status: Married    Spouse Name: N/A    Number of Children: N/A  . Years of Education: N/A   Occupational History  . Not on file.   Social History Main Topics  . Smoking status: Former Games developer  . Smokeless tobacco: Not on file  . Alcohol Use: Yes  . Drug Use: No  . Sexually Active:    Other Topics Concern  . Not on file   Social History Narrative  . No narrative on file     BP 125/83  Pulse 78  Resp 18  Ht 5\' 8"  (1.727 m)  Wt 157 lb 12.8 oz (71.578 kg)  BMI 23.99 kg/m2  Physical Exam:  Well appearing NAD HEENT: Unremarkable Neck:  No JVD, no thyromegally Lymphatics:  No adenopathy Back:  No CVA tenderness Lungs:  Clear. Well healed PPM incision. HEART:  Regular rate rhythm, no murmurs, no rubs, no clicks Abd:  soft, positive bowel sounds, no organomegally, no rebound, no guarding Ext:  2 plus pulses, no edema, no cyanosis, no clubbing Skin:  No rashes no nodules Neuro:  CN II through XII  intact, motor grossly intact DEVICE  Normal device function.  See PaceArt for details.   Assess/Plan:

## 2011-06-13 NOTE — Assessment & Plan Note (Signed)
His blood pressure is well controlled. He will continue his current meds and maintain a low sodium diet. 

## 2011-06-13 NOTE — Assessment & Plan Note (Signed)
His device is working normally. His device is reprogrammed today to allow for improved rate response.

## 2011-07-28 ENCOUNTER — Encounter (INDEPENDENT_AMBULATORY_CARE_PROVIDER_SITE_OTHER): Payer: Self-pay | Admitting: Surgery

## 2011-07-28 ENCOUNTER — Ambulatory Visit (INDEPENDENT_AMBULATORY_CARE_PROVIDER_SITE_OTHER): Payer: BC Managed Care – PPO | Admitting: Surgery

## 2011-07-28 VITALS — BP 122/78 | HR 58 | Temp 97.0°F | Ht 68.0 in | Wt 155.4 lb

## 2011-07-28 DIAGNOSIS — K4090 Unilateral inguinal hernia, without obstruction or gangrene, not specified as recurrent: Secondary | ICD-10-CM

## 2011-07-28 NOTE — Progress Notes (Signed)
Visit Diagnoses: 1. Inguinal hernia without mention of obstruction or gangrene, unilateral or unspecified, (not specified as recurrent)     HISTORY: Patient is a 65 year old male who underwent left inguinal hernia repair with mesh in June 2012. Postoperative course has been largely uneventful. He returns today for final postoperative visit.   EXAM: Left groin incision is well healed. Minimal soft tissue swelling. No sign of infection. Palpation in the inguinal canal shows no sign of recurrence with cough or Valsalva. No edema in the spermatic cord.   IMPRESSION: Status post left inguinal hernia repair with mesh, no complications.   PLAN: The patient is released electively without restriction. He will return to see me in this office as needed.    Velora Heckler, MD, FACS General & Endocrine Surgery [facility]

## 2011-09-08 ENCOUNTER — Telehealth: Payer: Self-pay | Admitting: Internal Medicine

## 2011-09-08 MED ORDER — PITAVASTATIN CALCIUM 2 MG PO TABS: 1.0000 mg | ORAL_TABLET | ORAL | Status: DC

## 2011-09-08 NOTE — Telephone Encounter (Signed)
No samples, coupon up front for pt to p/u, called in rx to [facility] per pt request

## 2011-09-08 NOTE — Telephone Encounter (Signed)
D/c crestor Try livalo 2mg  1/2 pill every other day.(we may have samples and/or coupon)

## 2011-09-08 NOTE — Telephone Encounter (Signed)
Pt says that Crestor is causing muscle aches and pt is needing to change meds. Pt is aware that Dr Cato Mulligan is out of the office and pt wants to know what he needs to do?

## 2011-10-08 ENCOUNTER — Other Ambulatory Visit: Payer: Self-pay | Admitting: Internal Medicine

## 2011-10-20 ENCOUNTER — Other Ambulatory Visit: Payer: Medicare Other

## 2011-11-09 ENCOUNTER — Encounter: Payer: Self-pay | Admitting: Internal Medicine

## 2011-11-09 ENCOUNTER — Ambulatory Visit (INDEPENDENT_AMBULATORY_CARE_PROVIDER_SITE_OTHER): Payer: BC Managed Care – PPO | Admitting: Internal Medicine

## 2011-11-09 VITALS — BP 102/74 | HR 84 | Temp 98.1°F | Ht 68.0 in | Wt 166.0 lb

## 2011-11-09 DIAGNOSIS — Z23 Encounter for immunization: Secondary | ICD-10-CM

## 2011-11-09 DIAGNOSIS — E785 Hyperlipidemia, unspecified: Secondary | ICD-10-CM

## 2011-11-09 DIAGNOSIS — Z0000 Encounter for general adult medical examination without abnormal findings: Secondary | ICD-10-CM

## 2011-11-09 LAB — CBC WITH DIFFERENTIAL/PLATELET
Basophils Absolute: 0 10*3/uL (ref 0.0–0.1)
Eosinophils Absolute: 0.4 10*3/uL (ref 0.0–0.7)
Lymphocytes Relative: 21.8 % (ref 12.0–46.0)
MCHC: 33.9 g/dL (ref 30.0–36.0)
Monocytes Absolute: 0.7 10*3/uL (ref 0.1–1.0)
Neutro Abs: 5.8 10*3/uL (ref 1.4–7.7)
Neutrophils Relative %: 64.9 % (ref 43.0–77.0)
RDW: 14.2 % (ref 11.5–14.6)

## 2011-11-09 LAB — POCT URINALYSIS DIPSTICK
Blood, UA: NEGATIVE
Protein, UA: NEGATIVE
Spec Grav, UA: 1.02
Urobilinogen, UA: 0.2
pH, UA: 6

## 2011-11-09 LAB — BASIC METABOLIC PANEL
Chloride: 102 mEq/L (ref 96–112)
Creatinine, Ser: 1.3 mg/dL (ref 0.4–1.5)
GFR: 59.94 mL/min — ABNORMAL LOW (ref >60.00)
Potassium: 4.5 mEq/L (ref 3.5–5.1)

## 2011-11-09 LAB — LDL CHOLESTEROL, DIRECT: Direct LDL: 162 mg/dL

## 2011-11-09 LAB — LIPID PANEL
Cholesterol: 285 mg/dL — ABNORMAL HIGH (ref 0–200)
Total CHOL/HDL Ratio: 6
Triglycerides: 482 mg/dL — ABNORMAL HIGH (ref 0.0–149.0)

## 2011-11-09 LAB — TSH: TSH: 3.65 u[IU]/mL (ref 0.35–5.50)

## 2011-11-09 LAB — HEPATIC FUNCTION PANEL
AST: 22 U/L (ref 0–37)
Albumin: 4.3 g/dL (ref 3.5–5.2)
Total Protein: 7.1 g/dL (ref 6.0–8.3)

## 2011-11-09 MED ORDER — PITAVASTATIN CALCIUM 2 MG PO TABS: ORAL_TABLET | ORAL | Status: DC

## 2011-11-09 NOTE — Progress Notes (Signed)
Patient ID: [name], male   DOB: 1946/05/13, 65 y.o.   MRN: [id number] cpx   Past Medical History  Diagnosis Date  . HTN (hypertension)   . CVA (cerebral vascular accident)   . Ischemic optic neuropathy   . Other and unspecified hyperlipidemia   . Bradycardia   . Abscess     right posterior neck    History   Social History  . Marital Status: Married    Spouse Name: N/A    Number of Children: N/A  . Years of Education: N/A   Occupational History  . Not on file.   Social History Main Topics  . Smoking status: Former Games developer  . Smokeless tobacco: Not on file  . Alcohol Use: Yes  . Drug Use: No  . Sexually Active:    Other Topics Concern  . Not on file   Social History Narrative  . No narrative on file    Past Surgical History  Procedure Date  . Cardiac catheterization 1996  . Cholecystectomy 02/07/2003  . Colonoscopy 06/23/2004  . Pacemaker insertion   . Eye surgery     left eye  . Abscess drainage     righ tposterior neck  . Left ingunial hernia 06/08/2011    Family History  Problem Relation Age of Onset  . Cancer Mother     lung  . Heart attack Mother   . Hypertension Mother   . Heart attack Father   . Heart disease Father   . Diabetes Father     Allergies  Allergen Reactions  . Crestor (Rosuvastatin Calcium) Other (See Comments)    Muscle ache    Current Outpatient Prescriptions on File Prior to Visit  Medication Sig Dispense Refill  . aspirin 325 MG EC tablet Take 325 mg by mouth daily.        . benazepril (LOTENSIN) 5 MG tablet TAKE ONE TABLET BY MOUTH EVERY DAY  90 tablet  1  . fish oil-omega-3 fatty acids 1000 MG capsule Take 2 g by mouth daily.        . Multiple Vitamin (MULTIVITAMIN) tablet Take 1 tablet by mouth daily.        . Pitavastatin Calcium (LIVALO) 2 MG TABS Take 0.5 tablets (1 mg total) by mouth every other day.  15 tablet  0   Current Facility-Administered Medications on File Prior to Visit  Medication Dose Route  Frequency Provider Last Rate Last Dose  . DOBUTamine (DOBUTREX) 1,000 mcg/mL in sodium chloride 0.9 % 150 mL infusion  40 mcg/kg Intravenous Continuous [name], MD         patient denies chest pain, shortness of breath, orthopnea. Denies lower extremity edema, abdominal pain, change in appetite, change in bowel movements. Patient denies rashes, musculoskeletal complaints. No other specific complaints in a complete review of systems.   BP 102/74  Pulse 84  Temp(Src) 98.1 F (36.7 C) (Oral)  Ht 5\' 8"  (1.727 m)  Wt 166 lb (75.297 kg)  BMI 25.24 kg/m2 Well-developed male in no acute distress. HEENT exam atraumatic, normocephalic, extraocular muscles are intact. Conjunctivae are pink without exudate. Neck is supple without lymphadenopathy, thyromegaly, jugular venous distention. Chest is clear to auscultation without increased work of breathing. Cardiac exam S1-S2 are regular. The PMI is normal. No significant murmurs or gallops. Abdominal exam active bowel sounds, soft, nontender. No abdominal bruits. Extremities no clubbing cyanosis or edema. Peripheral pulses are normal without bruits. Neurologic exam alert and oriented without any motor or sensory  deficits.   . Assessment and plan: Well visit. Health maintenance up-to-date.

## 2011-11-17 ENCOUNTER — Telehealth: Payer: Self-pay | Admitting: *Deleted

## 2011-11-17 NOTE — Telephone Encounter (Signed)
Yes  6 weeks

## 2011-11-17 NOTE — Telephone Encounter (Signed)
Gave pt lab results and he does not feel comfortable waiting 3 mths for a recheck.  Wants to know if he could do it sooner

## 2011-11-18 NOTE — Telephone Encounter (Signed)
Pt aware.

## 2011-12-15 ENCOUNTER — Other Ambulatory Visit (INDEPENDENT_AMBULATORY_CARE_PROVIDER_SITE_OTHER): Payer: BC Managed Care – PPO

## 2011-12-15 DIAGNOSIS — E785 Hyperlipidemia, unspecified: Secondary | ICD-10-CM

## 2011-12-15 LAB — HEPATIC FUNCTION PANEL
Alkaline Phosphatase: 66 U/L (ref 39–117)
Bilirubin, Direct: 0.1 mg/dL (ref 0.0–0.3)
Total Bilirubin: 1.1 mg/dL (ref 0.3–1.2)

## 2011-12-15 LAB — LIPID PANEL
HDL: 43.5 mg/dL (ref >39.00)
Total CHOL/HDL Ratio: 5
VLDL: 45.2 mg/dL — ABNORMAL HIGH (ref 0.0–40.0)

## 2011-12-30 ENCOUNTER — Telehealth: Payer: Self-pay | Admitting: *Deleted

## 2011-12-30 NOTE — Telephone Encounter (Signed)
Pt wanted to know lab results, gave pt cholesterol numbers and per Dr Cato Mulligan pt is to stay on current on meds and recheck in a year.  Pt aware

## 2012-01-21 ENCOUNTER — Encounter: Payer: Self-pay | Admitting: Internal Medicine

## 2012-01-21 ENCOUNTER — Ambulatory Visit (INDEPENDENT_AMBULATORY_CARE_PROVIDER_SITE_OTHER): Payer: Medicare Other | Admitting: Internal Medicine

## 2012-01-21 DIAGNOSIS — Z950 Presence of cardiac pacemaker: Secondary | ICD-10-CM

## 2012-01-21 DIAGNOSIS — I498 Other specified cardiac arrhythmias: Secondary | ICD-10-CM

## 2012-01-21 DIAGNOSIS — I10 Essential (primary) hypertension: Secondary | ICD-10-CM

## 2012-01-21 LAB — PACEMAKER DEVICE OBSERVATION
ATRIAL PACING PM: 99
DEVICE MODEL PM: 66063261
RV LEAD THRESHOLD: 1.2 V
VENTRICULAR PACING PM: 0

## 2012-01-21 NOTE — Patient Instructions (Signed)
Your physician wants you to follow-up in: 6 months device clinic and 12 months with Dr. Taylor You will receive a reminder letter in the mail two months in advance. If you don't receive a letter, please call our office to schedule the follow-up appointment.  

## 2012-01-21 NOTE — Progress Notes (Signed)
HPI [name] returns today for followup. He is a 66 year old man with a history of symptomatic bradycardia status post permanent pacemaker insertion. He recently underwent hernia surgery and previous to that underwent hip surgery. He is slowly recovering from both. He also underwent pacemaker generator change prior to that. The patient continues to swim regularly and denies chest pain, shortness of breath, or peripheral edema. He remains frustrated by his inability to swallow as fast as he once did. No other complaints today.  Allergies  Allergen Reactions  . Crestor (Rosuvastatin Calcium) Other (See Comments)    Muscle ache     Current Outpatient Prescriptions  Medication Sig Dispense Refill  . aspirin 325 MG EC tablet Take 325 mg by mouth daily.        . benazepril (LOTENSIN) 5 MG tablet TAKE ONE TABLET BY MOUTH EVERY DAY  90 tablet  1  . fish oil-omega-3 fatty acids 1000 MG capsule Take 2 g by mouth daily.        . Multiple Vitamin (MULTIVITAMIN) tablet Take 1 tablet by mouth daily.        . Pitavastatin Calcium (LIVALO) 2 MG TABS 1 by mouth daily or as directed  30 tablet  6     Past Medical History  Diagnosis Date  . HTN (hypertension)   . CVA (cerebral vascular accident)   . Ischemic optic neuropathy   . Other and unspecified hyperlipidemia   . Bradycardia   . Abscess     right posterior neck    ROS:   All systems reviewed and negative except as noted in the HPI.   Past Surgical History  Procedure Date  . Cardiac catheterization 1996  . Cholecystectomy 02/07/2003  . Colonoscopy 06/23/2004  . Pacemaker insertion   . Eye surgery     left eye  . Abscess drainage     righ tposterior neck  . Left ingunial hernia 06/08/2011     Family History  Problem Relation Age of Onset  . Cancer Mother     lung  . Heart attack Mother   . Hypertension Mother   . Heart attack Father   . Heart disease Father   . Diabetes Father      History   Social History  .  Marital Status: Married    Spouse Name: N/A    Number of Children: N/A  . Years of Education: N/A   Occupational History  . Not on file.   Social History Main Topics  . Smoking status: Former Games developer  . Smokeless tobacco: Not on file  . Alcohol Use: Yes  . Drug Use: No  . Sexually Active:    Other Topics Concern  . Not on file   Social History Narrative  . No narrative on file     BP 118/78  Pulse 74  Ht 5' 8.5" (1.74 m)  Wt 74.299 kg (163 lb 12.8 oz)  BMI 24.54 kg/m2  SpO2 99%  Physical Exam:  Well appearing middle-aged man, NAD HEENT: Unremarkable Neck:  No JVD, no thyromegally Lungs:  Clear with no wheezes, rales, or rhonchi. Well-healed pacemaker incision. HEART:  Regular rate rhythm, no murmurs, no rubs, no clicks Abd:  soft, positive bowel sounds, no organomegally, no rebound, no guarding Ext:  2 plus pulses, no edema, no cyanosis, no clubbing Skin:  No rashes no nodules Neuro:  CN II through XII intact, motor grossly intact  DEVICE  Normal device function.  See PaceArt for details.   Assess/Plan:

## 2012-01-21 NOTE — Assessment & Plan Note (Signed)
His blood pressure is well controlled. He will maintain a low sodium diet. 

## 2012-01-21 NOTE — Assessment & Plan Note (Signed)
Pacemaker interrogation today demonstrates normal function. He is 99% atrial paced and 0% ventricular paced. He has had no arrhythmias.

## 2012-04-07 IMAGING — CT CT L SPINE W/O CM
3 of 8 series · 15 of 33 positions shown, 18 images · non-contrast
Comparison: None

CLINICAL DATA: Low back and right leg pain.

CT LUMBAR SPINE WITHOUT CONTRAST
TECHNIQUE: Multidetector CT imaging of the lumbar spine was
performed without intravenous contrast administration. Multiplanar
CT image reconstructions were also generated.

[Series 4: spineroutine 3.0 b30s · axial · 0.25mm/px · z∈[-304,-124]mm · 7 of 81 slices shown, 9 images]
[im 11/81  soft-tissue]
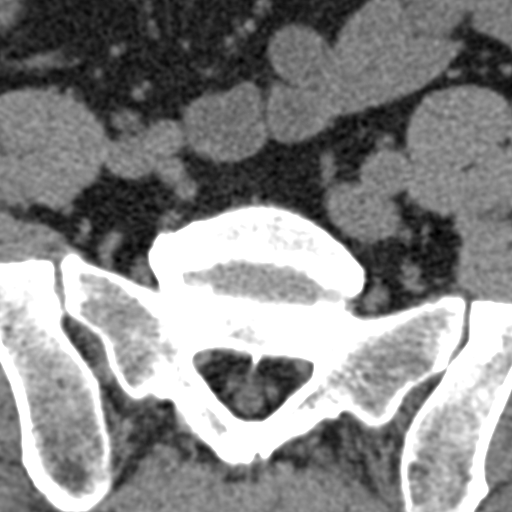
[im 11/81  bone]
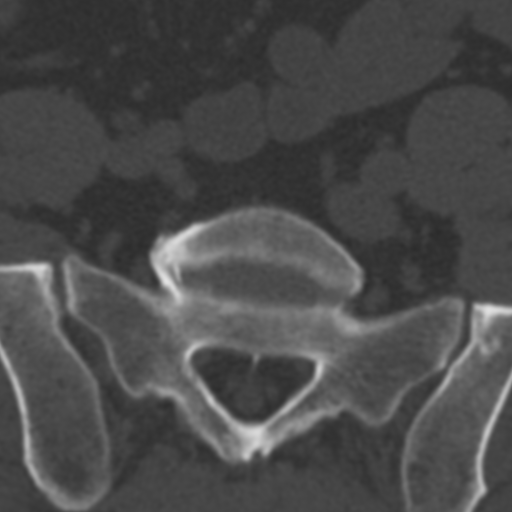
[im 21/81  bone]
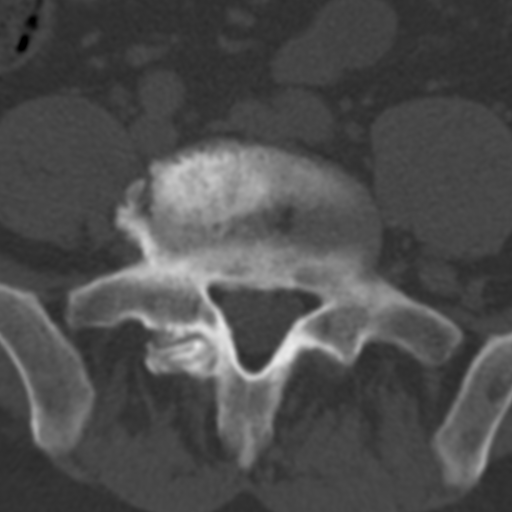
[im 31/81  bone]
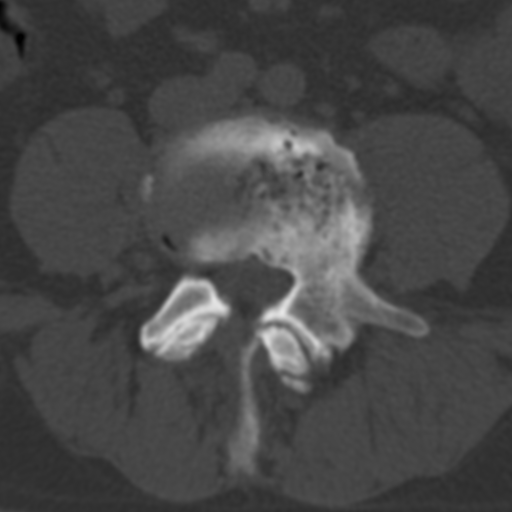
[im 41/81  bone]
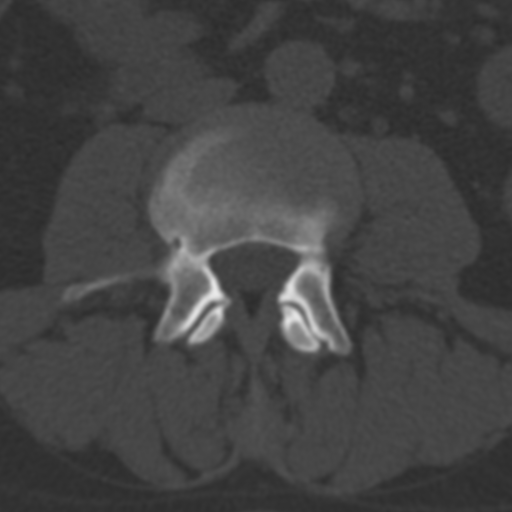
[im 51/81  soft-tissue]
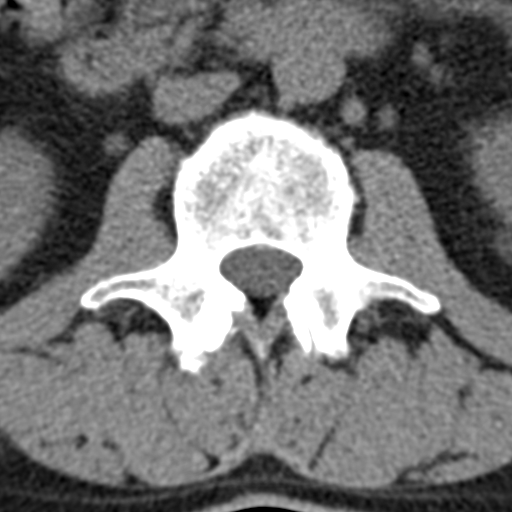
[im 51/81  bone]
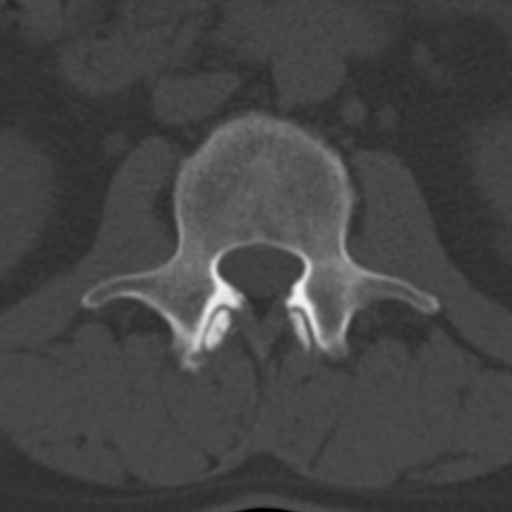
[im 61/81  bone]
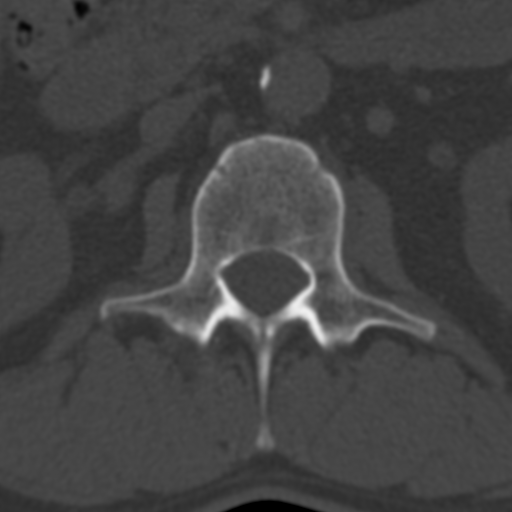
[im 71/81  bone]
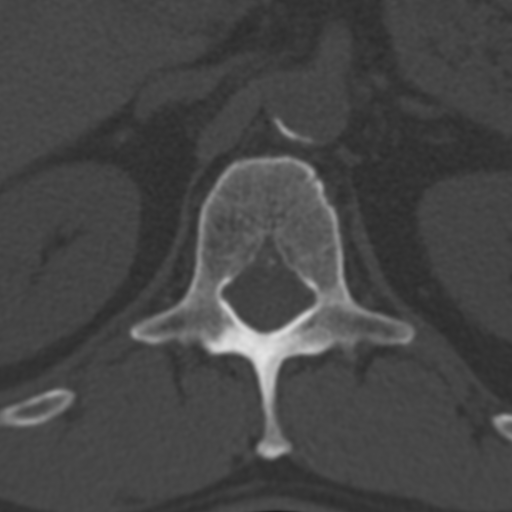

[Series 602: <mpr thick range> · coronal · 0.49mm/px · 3 of 53 slices shown]
[im 11/53  bone]
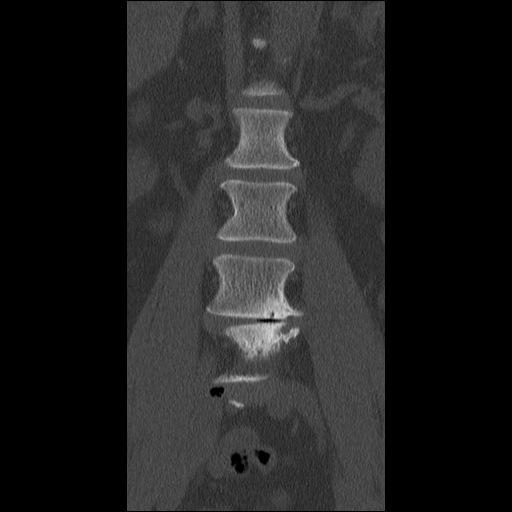
[im 21/53  bone]
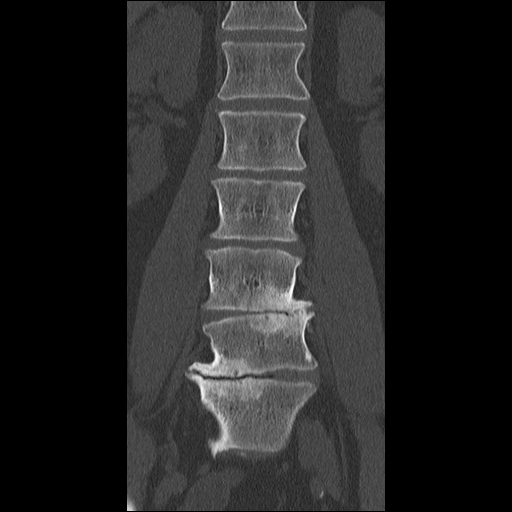
[im 32/53  bone]
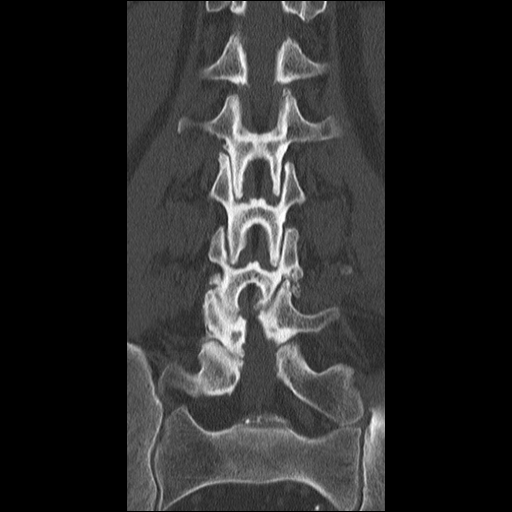

[Series 603: <mpr thick range(1)> · sagittal · 0.49mm/px · 5 of 60 slices shown, 6 images]
[im 20/60  bone]
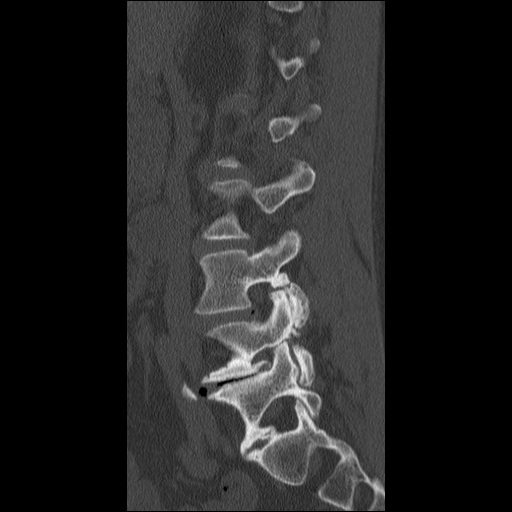
[im 25/60  bone]
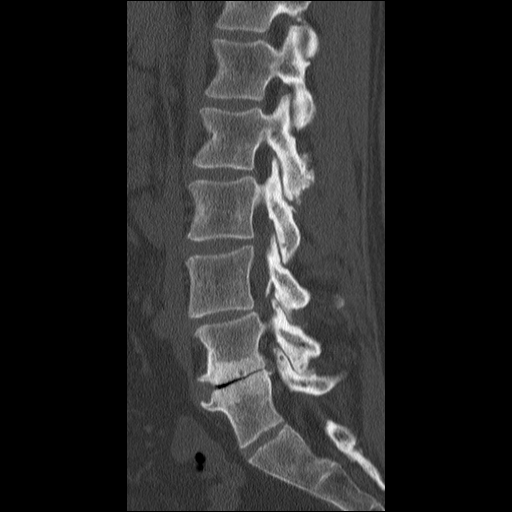
[im 30/60  soft-tissue]
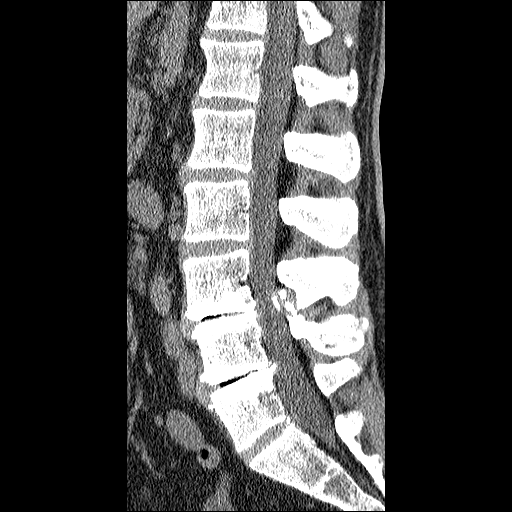
[im 30/60  bone]
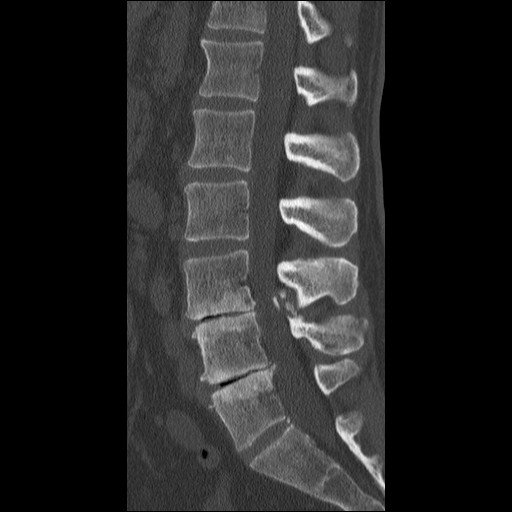
[im 35/60  bone]
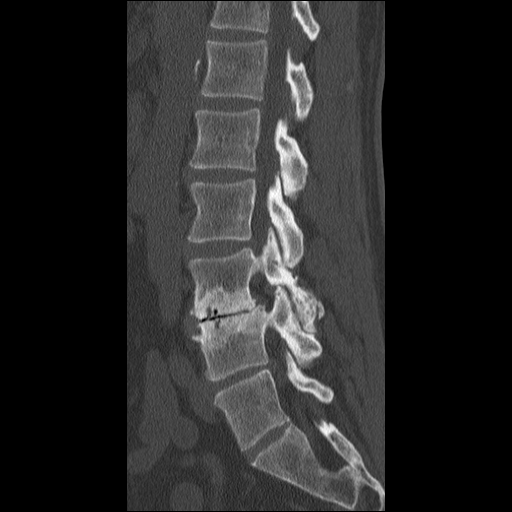
[im 40/60  bone]
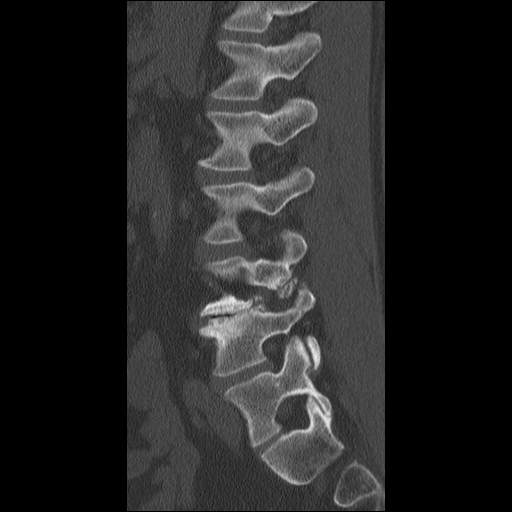

[15 of 33 positions shown; findings below may reference images not displayed]

FINDINGS: The sagittal reformatted images demonstrate normal
alignment of the lumbar vertebral bodies.  There are moderate
degenerative changes at L4-5 and L5-S1 with disc space narrowing,
vacuum disc phenomenon, reactive marrow changes, bony eburnation
and associated facet disease.  The no acute bony findings.  There
is transitional lumbar anatomy with partial lumbarization of S1.

No significant retroperitoneal findings are identified.  There are
aortic calcifications but no aneurysm.

L1-2:  No significant findings.

L2-3:  Diffuse bulging annulus in short pedicles contribute to
early spinal and bilateral lateral recess stenosis.  No significant
foraminal stenosis.

L3-4:  Diffuse bulging annulus, short pedicles and facet disease
contribute to moderate spinal and bilateral lateral recess
stenosis.  There is also mild right foraminal encroachment.

L4-5:  Advanced degenerative disc disease with a bulging
degenerated annulus and osteophytic ridging.  This in combination
with short pedicles and facet disease contributes to moderately
severe spinal and lateral recess stenosis.  There is mild to
moderate left foraminal stenosis also.

L5-S1:  Severe disc disease with a calcified central disc
protrusion, osteophytic spurring and facet disease contribute to
mild spinal and bilateral recess stenosis.  This also moderate to
moderately severe right foraminal stenosis due to osteophytic
spurring and a broad-based disc protrusion.
IMPRESSION: 1.  Degenerative lumbar spondylosis with disc disease and facet
disease most notable at L4-5 and L5-S1.
2.  Transitional lumbar anatomy.
3.  Multifactorial spinal, lateral recess and foraminal stenosis at
L3-4, L4-5 and L5-S1 as described above.

## 2012-04-10 ENCOUNTER — Other Ambulatory Visit: Payer: Self-pay | Admitting: Internal Medicine

## 2012-05-26 ENCOUNTER — Telehealth: Payer: Self-pay | Admitting: Internal Medicine

## 2012-05-26 NOTE — Telephone Encounter (Signed)
Pt stated MD put him on new  chole  Med requesting blood work. Can I sch? Pt is aware MD out of office until 06-09-2012

## 2012-05-27 NOTE — Telephone Encounter (Signed)
Ok to schedule lipid and LFT 272.4

## 2012-05-27 NOTE — Telephone Encounter (Signed)
Pt is sch for 05-31-2012

## 2012-05-27 NOTE — Telephone Encounter (Signed)
Lm with pt wife vickie

## 2012-05-31 ENCOUNTER — Other Ambulatory Visit (INDEPENDENT_AMBULATORY_CARE_PROVIDER_SITE_OTHER): Payer: Medicare Other

## 2012-05-31 DIAGNOSIS — E785 Hyperlipidemia, unspecified: Secondary | ICD-10-CM

## 2012-05-31 LAB — HEPATIC FUNCTION PANEL
AST: 19 U/L (ref 0–37)
Albumin: 4.2 g/dL (ref 3.5–5.2)
Alkaline Phosphatase: 71 U/L (ref 39–117)
Total Protein: 7.2 g/dL (ref 6.0–8.3)

## 2012-05-31 LAB — LIPID PANEL
Cholesterol: 241 mg/dL — ABNORMAL HIGH (ref 0–200)
Total CHOL/HDL Ratio: 5
Triglycerides: 306 mg/dL — ABNORMAL HIGH (ref 0.0–149.0)
VLDL: 61.2 mg/dL — ABNORMAL HIGH (ref 0.0–40.0)

## 2012-06-01 LAB — LDL CHOLESTEROL, DIRECT: Direct LDL: 133.9 mg/dL

## 2012-06-13 ENCOUNTER — Ambulatory Visit (INDEPENDENT_AMBULATORY_CARE_PROVIDER_SITE_OTHER): Payer: Medicare Other | Admitting: Internal Medicine

## 2012-06-13 ENCOUNTER — Encounter: Payer: Self-pay | Admitting: Internal Medicine

## 2012-06-13 VITALS — BP 122/76 | Temp 97.8°F | Wt 162.0 lb

## 2012-06-13 DIAGNOSIS — E785 Hyperlipidemia, unspecified: Secondary | ICD-10-CM

## 2012-06-13 DIAGNOSIS — K219 Gastro-esophageal reflux disease without esophagitis: Secondary | ICD-10-CM

## 2012-06-13 DIAGNOSIS — M719 Bursopathy, unspecified: Secondary | ICD-10-CM

## 2012-06-13 DIAGNOSIS — M7580 Other shoulder lesions, unspecified shoulder: Secondary | ICD-10-CM

## 2012-06-13 MED ORDER — OMEPRAZOLE 20 MG PO CPDR: 20.0000 mg | DELAYED_RELEASE_CAPSULE | Freq: Every day | ORAL | Status: DC

## 2012-06-13 NOTE — Assessment & Plan Note (Signed)
Recurrence Needs PPI and endoscopy

## 2012-06-13 NOTE — Progress Notes (Signed)
Patient ID: [name], male   DOB: [birth year], 66 y.o.   MRN: [id number] Lipids-- on pitivastatin qod  htn-- tolerating meds  GERD-  Past Medical History  Diagnosis Date  . HTN (hypertension)   . CVA (cerebral vascular accident)   . Ischemic optic neuropathy   . Other and unspecified hyperlipidemia   . Bradycardia   . Abscess     right posterior neck    History   Social History  . Marital Status: Married    Spouse Name: N/A    Number of Children: N/A  . Years of Education: N/A   Occupational History  . Not on file.   Social History Main Topics  . Smoking status: Former Games developer  . Smokeless tobacco: Not on file  . Alcohol Use: Yes  . Drug Use: No  . Sexually Active:    Other Topics Concern  . Not on file   Social History Narrative  . No narrative on file    Past Surgical History  Procedure Date  . Cardiac catheterization 1996  . Cholecystectomy 02/07/2003  . Colonoscopy 06/23/2004  . Pacemaker insertion   . Eye surgery     left eye  . Abscess drainage     righ tposterior neck  . Left ingunial hernia 06/08/2011    Family History  Problem Relation Age of Onset  . Cancer Mother     lung  . Heart attack Mother   . Hypertension Mother   . Heart attack Father   . Heart disease Father   . Diabetes Father     Allergies  Allergen Reactions  . Crestor (Rosuvastatin Calcium) Other (See Comments)    Muscle ache    Current Outpatient Prescriptions on File Prior to Visit  Medication Sig Dispense Refill  . aspirin 325 MG EC tablet Take 325 mg by mouth daily.        . benazepril (LOTENSIN) 5 MG tablet TAKE ONE TABLET BY MOUTH EVERY DAY  90 tablet  0  . fish oil-omega-3 fatty acids 1000 MG capsule Take 2 g by mouth daily.        . Multiple Vitamin (MULTIVITAMIN) tablet Take 1 tablet by mouth daily.        . naproxen (NAPROSYN) 500 MG tablet TAKE ONE TABLET BY MOUTH TWICE DAILY WITH FOOD  60 tablet  3  . Pitavastatin Calcium (LIVALO) 2 MG TABS 1 by  mouth daily or as directed  30 tablet  6     patient denies chest pain, shortness of breath, orthopnea. Denies lower extremity edema, abdominal pain, change in appetite, change in bowel movements. Patient denies rashes, musculoskeletal complaints. No other specific complaints in a complete review of systems.   BP 132/100  Temp 97.8 F (36.6 C) (Oral)  Wt 162 lb (73.483 kg)  well-developed well-nourished male in no acute distress. HEENT exam atraumatic, normocephalic, neck supple without jugular venous distention. Chest clear to auscultation cardiac exam S1-S2 are regular. Abdominal exam overweight with bowel sounds, soft and nontender. Extremities no edema. Neurologic exam is alert with a normal gait.

## 2012-06-13 NOTE — Assessment & Plan Note (Signed)
Take pitavastatin 1mg  every day

## 2012-06-30 ENCOUNTER — Telehealth: Payer: Self-pay

## 2012-06-30 ENCOUNTER — Ambulatory Visit (INDEPENDENT_AMBULATORY_CARE_PROVIDER_SITE_OTHER): Payer: Medicare Other | Admitting: Internal Medicine

## 2012-06-30 ENCOUNTER — Encounter: Payer: Self-pay | Admitting: Internal Medicine

## 2012-06-30 VITALS — BP 122/80 | HR 69 | Temp 98.2°F | Ht 68.0 in | Wt 162.0 lb

## 2012-06-30 DIAGNOSIS — M109 Gout, unspecified: Secondary | ICD-10-CM

## 2012-06-30 MED ORDER — COLCHICINE 0.6 MG PO TABS: 0.6000 mg | ORAL_TABLET | Freq: Every day | ORAL | Status: DC

## 2012-06-30 NOTE — Assessment & Plan Note (Signed)
66 year old with painful left great toe. Symptoms likely secondary to gouty arthritis. Minimal improvement with taking indomethacin. Trial of colchicine 0.6 mg once daily. We discussed lowering his statin dose due to increased risk of myopathy. Check uric acid level. If greater than 7, we discussed possibly starting xantine oxidase inhibitor.  If persistent symptoms while taking prednisone, we discussed using short course of prednisone taper.  Dietary changes reviewed.

## 2012-06-30 NOTE — Patient Instructions (Addendum)
Take 1/2 dose of Livalo while you are taking colchicine Please call our office if your symptoms do not improve or gets worse. Our office will contact you re: blood test results

## 2012-06-30 NOTE — Progress Notes (Signed)
Subjective:    Patient ID: [name], male    DOB: 1945/12/02, 66 y.o.   MRN: [id number]  HPI  66 year old white male with history of bradycardia status post cardiac pacemaker and CVA complains of left toe pain for the last 2 weeks. He has had intermittent symptoms for the last 1-2 years. He reports approximately 6 episodes in the last 12 months. He complains of pain in the DIP of left great toe. He has had minimal redness and tenderness of his first metatarsal joint. He rates severity of pain as 5/10.  His medication list reviewed. He's not on any thiazide diuretics. He takes full dose aspirin due to history of CVA. He is a social drinker. He drinks beer.  Review of Systems No fever or chills,  No other joints affected  Past Medical History  Diagnosis Date  . HTN (hypertension)   . CVA (cerebral vascular accident)   . Ischemic optic neuropathy   . Other and unspecified hyperlipidemia   . Bradycardia   . Abscess     right posterior neck    History   Social History  . Marital Status: Married    Spouse Name: N/A    Number of Children: N/A  . Years of Education: N/A   Occupational History  . Not on file.   Social History Main Topics  . Smoking status: Former Games developer  . Smokeless tobacco: Not on file  . Alcohol Use: Yes  . Drug Use: No  . Sexually Active:    Other Topics Concern  . Not on file   Social History Narrative  . No narrative on file    Past Surgical History  Procedure Date  . Cardiac catheterization 1996  . Cholecystectomy 02/07/2003  . Colonoscopy 06/23/2004  . Pacemaker insertion   . Eye surgery     left eye  . Abscess drainage     righ tposterior neck  . Left ingunial hernia 06/08/2011    Family History  Problem Relation Age of Onset  . Cancer Mother     lung  . Heart attack Mother   . Hypertension Mother   . Heart attack Father   . Heart disease Father   . Diabetes Father     Allergies  Allergen Reactions  . Crestor  (Rosuvastatin Calcium) Other (See Comments)    Muscle ache    Current Outpatient Prescriptions on File Prior to Visit  Medication Sig Dispense Refill  . aspirin 325 MG EC tablet Take 325 mg by mouth daily.        . benazepril (LOTENSIN) 5 MG tablet TAKE ONE TABLET BY MOUTH EVERY DAY  90 tablet  0  . fish oil-omega-3 fatty acids 1000 MG capsule Take 2 g by mouth daily.        Marland Kitchen glucosamine-chondroitin 500-400 MG tablet Take 1 tablet by mouth 3 (three) times daily.      . Multiple Vitamin (MULTIVITAMIN) tablet Take 1 tablet by mouth daily.        . naproxen (NAPROSYN) 500 MG tablet TAKE ONE TABLET BY MOUTH TWICE DAILY WITH FOOD  60 tablet  3  . omeprazole (PRILOSEC) 20 MG capsule Take 1 capsule (20 mg total) by mouth daily.  30 capsule  3  . Pitavastatin Calcium (LIVALO) 2 MG TABS 1 by mouth daily or as directed  30 tablet  6  . colchicine 0.6 MG tablet Take 1 tablet (0.6 mg total) by mouth daily.  30 tablet  1  BP 122/80  Pulse 69  Temp 98.2 F (36.8 C) (Oral)  Ht 5\' 8"  (1.727 m)  Wt 162 lb (73.483 kg)  BMI 24.63 kg/m2  SpO2 95%       Objective:   Physical Exam  Constitutional: He appears well-developed and well-nourished.  Cardiovascular: Normal rate, regular rhythm and normal heart sounds.   Pulmonary/Chest: Effort normal and breath sounds normal. He has no wheezes.  Musculoskeletal:       Mild redness of left MTJ.  Tenderness of left DIP of great toe          Assessment & Plan:

## 2012-06-30 NOTE — Telephone Encounter (Signed)
Patient called informed left big toe pain for the past 1 to 2 weeks.  The patient states has worsened and thinks may be gout.  Informed Dr. Artist Pais and MD stated to have patient schedule appt. Today 06/30/12.  Patient agreed to do so.

## 2012-07-01 LAB — URIC ACID: Uric Acid, Serum: 7.3 mg/dL (ref 4.0–7.8)

## 2012-07-01 LAB — BASIC METABOLIC PANEL
BUN: 20 mg/dL (ref 6–23)
Chloride: 104 mEq/L (ref 96–112)
Creatinine, Ser: 1.1 mg/dL (ref 0.4–1.5)
GFR: 73.56 mL/min (ref >60.00)
Potassium: 4.4 mEq/L (ref 3.5–5.1)

## 2012-07-05 ENCOUNTER — Other Ambulatory Visit: Payer: Self-pay | Admitting: Internal Medicine

## 2012-07-05 ENCOUNTER — Encounter: Payer: Self-pay | Admitting: *Deleted

## 2012-07-05 ENCOUNTER — Other Ambulatory Visit: Payer: Self-pay

## 2012-07-05 MED ORDER — BENAZEPRIL HCL 5 MG PO TABS: 5.0000 mg | ORAL_TABLET | Freq: Every day | ORAL | Status: DC

## 2012-07-13 ENCOUNTER — Telehealth: Payer: Self-pay | Admitting: *Deleted

## 2012-07-13 NOTE — Telephone Encounter (Signed)
Pt aware.

## 2012-07-13 NOTE — Telephone Encounter (Signed)
Yes, i think he still needs an endoscopy

## 2012-07-13 NOTE — Telephone Encounter (Signed)
Pt called to let Dr Cato Mulligan know that the Protonix was working.  He wanted to know if he had to keep the appt with Dr Juanda Chance on 8/20 since he was going to see her for GERD.

## 2012-07-19 ENCOUNTER — Encounter: Payer: Self-pay | Admitting: Internal Medicine

## 2012-07-19 ENCOUNTER — Ambulatory Visit (INDEPENDENT_AMBULATORY_CARE_PROVIDER_SITE_OTHER): Payer: Medicare Other | Admitting: Internal Medicine

## 2012-07-19 VITALS — BP 128/74 | HR 60 | Ht 68.0 in | Wt 163.0 lb

## 2012-07-19 DIAGNOSIS — K219 Gastro-esophageal reflux disease without esophagitis: Secondary | ICD-10-CM

## 2012-07-19 NOTE — Patient Instructions (Addendum)
Dr Swords 

## 2012-07-19 NOTE — Progress Notes (Signed)
DAEGON DEISS [birth year] MRN [id number]   History of Present Illness:  This is a 66 year old white male with rather sudden onset of gastroesophageal reflux and dyspepsia. The symptomsoccur in the evening and at night with a few episodes of regurgitation of acid but no cough or hoarseness. He gives no prior history of gastroesophageal reflux. He swims 4 times a week in the evening, usually within an hour after eating supper. He has never had problems with indigestion before now. Dr. Cato Mulligan has put him on Prilosec 20 mg daily which he has taken for the past 6 weeks with complete relief and resolution of his symptoms. He was also having abdominal discomfort and dyspepsia which has subsided with Prilosec. He denies taking any anti-inflammatory agents prior to these episodes. He since then has taken ibuprofen 200 mg when necessary. His weight has been stable. He doesn't smoke. There is no dysphagia or odynophagia. We have seen him before for a colonoscopy in 2005 when he had a polyp which was not recovered and a repeat colonoscopy in April 2011 showed internal hemorrhoids. He has a history of a cardiac catheterization resulting in an ischemic episode causing left hemiparesis which since then has completely resolved. He is not on any anticoagulants. He had a prior cholecystectomy.   Past Medical History  Diagnosis Date  . HTN (hypertension)   . CVA (cerebral vascular accident)   . Ischemic optic neuropathy   . Other and unspecified hyperlipidemia   . Bradycardia   . Abscess     right posterior neck  . Spinal stenosis   . GERD (gastroesophageal reflux disease)   . External hemorrhoids   . Gout   . Elevated LFTs    Past Surgical History  Procedure Date  . Cardiac catheterization 1996  . Cholecystectomy 02/07/2003  . Colonoscopy 06/23/2004  . Pacemaker insertion   . Eye surgery     left eye  . Abscess drainage     righ tposterior neck  . Left ingunial hernia 06/08/2011    reports that  he has quit smoking. He has never used smokeless tobacco. He reports that he does not drink alcohol or use illicit drugs. family history includes Diabetes in his father and paternal grandfather; Heart attack in his father and mother; Heart disease in his father; Hypertension in his mother; and Lung cancer in his mother. Allergies  Allergen Reactions  . Crestor (Rosuvastatin Calcium) Other (See Comments)    Muscle ache        Review of Systems:  The remainder of the 10 point ROS is negative except as outlined in H&P   Physical Exam: General appearance  Well developed, in no distress. Eyes- non icteric. HEENT nontraumatic, normocephalic. Mouth no lesions, tongue papillated, no cheilosis. Neck supple without adenopathy, thyroid not enlarged, no carotid bruits, no JVD. Lungs Clear to auscultation bilaterally. Cor normal S1, normal S2, regular rhythm, no murmur,  quiet precordium. Abdomen: Soft, abdomen, nontender with normoactive bowel sounds. Scar from left inguinal herniorrhaphy is somewhat tender but there is no evidence of recurrent hernia. Rectal: Not done. Extremities no pedal edema. Skin no lesions. Neurological alert and oriented x 3. Psychological normal mood and affect.  Assessment and Plan:  Problem #1 Recent onset of gastroesophageal reflux and dyspepsia in a 66 year old male with no prior GI history. The symptoms have subsided on Prilosec 20 mg daily for the past 6 weeks. There are no worrisome symptoms such as weight loss or dysphagia. I suspect he had esophagitis and possibly  gastritis which has resolved. We have discussed whether he needs endoscopy now or whether we will wait and taper off the Prilosec and see whether his symptoms recur. We agreed that we will try to taper Prilosec. He will let us know in the next few months about his symptoms. I advised him to follow antireflux measures.specifically I suggested that he swims before supper on empty stomach.  Problem #2  Colorectal screening. His last colonoscopy was in April 2011. His next colonoscopy will be due in April 2021.  07/19/2012 Lina Sar

## 2012-07-21 ENCOUNTER — Ambulatory Visit (INDEPENDENT_AMBULATORY_CARE_PROVIDER_SITE_OTHER): Payer: Medicare Other | Admitting: *Deleted

## 2012-07-21 ENCOUNTER — Encounter: Payer: Self-pay | Admitting: Internal Medicine

## 2012-07-21 DIAGNOSIS — I498 Other specified cardiac arrhythmias: Secondary | ICD-10-CM

## 2012-07-21 LAB — PACEMAKER DEVICE OBSERVATION
ATRIAL PACING PM: 99
DEVICE MODEL PM: 66063261
RV LEAD AMPLITUDE: 13.3 mv
VENTRICULAR PACING PM: 0

## 2012-07-21 NOTE — Progress Notes (Signed)
PPM check 

## 2012-08-24 ENCOUNTER — Ambulatory Visit (INDEPENDENT_AMBULATORY_CARE_PROVIDER_SITE_OTHER): Payer: Medicare Other | Admitting: Family Medicine

## 2012-08-24 ENCOUNTER — Encounter: Payer: Self-pay | Admitting: Family Medicine

## 2012-08-24 VITALS — BP 120/70 | Temp 98.2°F | Wt 164.0 lb

## 2012-08-24 DIAGNOSIS — L739 Follicular disorder, unspecified: Secondary | ICD-10-CM

## 2012-08-24 DIAGNOSIS — M79609 Pain in unspecified limb: Secondary | ICD-10-CM

## 2012-08-24 DIAGNOSIS — L738 Other specified follicular disorders: Secondary | ICD-10-CM

## 2012-08-24 DIAGNOSIS — M79629 Pain in unspecified upper arm: Secondary | ICD-10-CM

## 2012-08-24 MED ORDER — DOXYCYCLINE HYCLATE 100 MG PO TABS: 100.0000 mg | ORAL_TABLET | Freq: Two times a day (BID) | ORAL | Status: DC

## 2012-08-24 NOTE — Patient Instructions (Addendum)
Follow up for any progressive redness, swelling, or any fever.

## 2012-08-24 NOTE — Progress Notes (Signed)
  Subjective:    Patient ID: [name], male    DOB: 15-Apr-1946, 66 y.o.   MRN: [id number]  HPI  Tender right axillary region. Noted small knot in this area recently. No drainage. No fever or chills. Also few erythematous papules and pustules on the back. No recent hot tub use. Swims regularly. No recent nausea or vomiting.   Review of Systems  Constitutional: Negative for fever and chills.  Skin: Positive for rash.  Neurological: Negative for headaches.       Objective:   Physical Exam  Constitutional: He appears well-developed and well-nourished.  Cardiovascular: Normal rate and regular rhythm.   Pulmonary/Chest: Effort normal and breath sounds normal. No respiratory distress. He has no wheezes. He has no rales.  Skin:       Upper back reveals some nonspecific erythematous papules. Few of these have yellow white pinpoint pustule. Right axilla reveals about 1/2 cm movable slightly tender cyst versus lymph node. No fluctuance. No other adenopathy noted. No skin changes other than very mild erythema          Assessment & Plan:  Follicular type rash upper back. Soreness right axilla -either inflamed cyst versus possible reactive node. Doxycycline 100 mg twice a day for 10 days. Caution for sunblock. Touch base if any progressive swelling right axillary region or not resolving next couple of weeks

## 2012-09-20 ENCOUNTER — Ambulatory Visit (INDEPENDENT_AMBULATORY_CARE_PROVIDER_SITE_OTHER): Payer: Medicare Other

## 2012-09-20 DIAGNOSIS — Z23 Encounter for immunization: Secondary | ICD-10-CM

## 2012-10-04 IMAGING — CR DG CHEST 2V
2 series · 2 of 2 positions shown · non-contrast
Comparison: None.

CLINICAL DATA: Preop.

CHEST - 2 VIEW

[w chest pa]
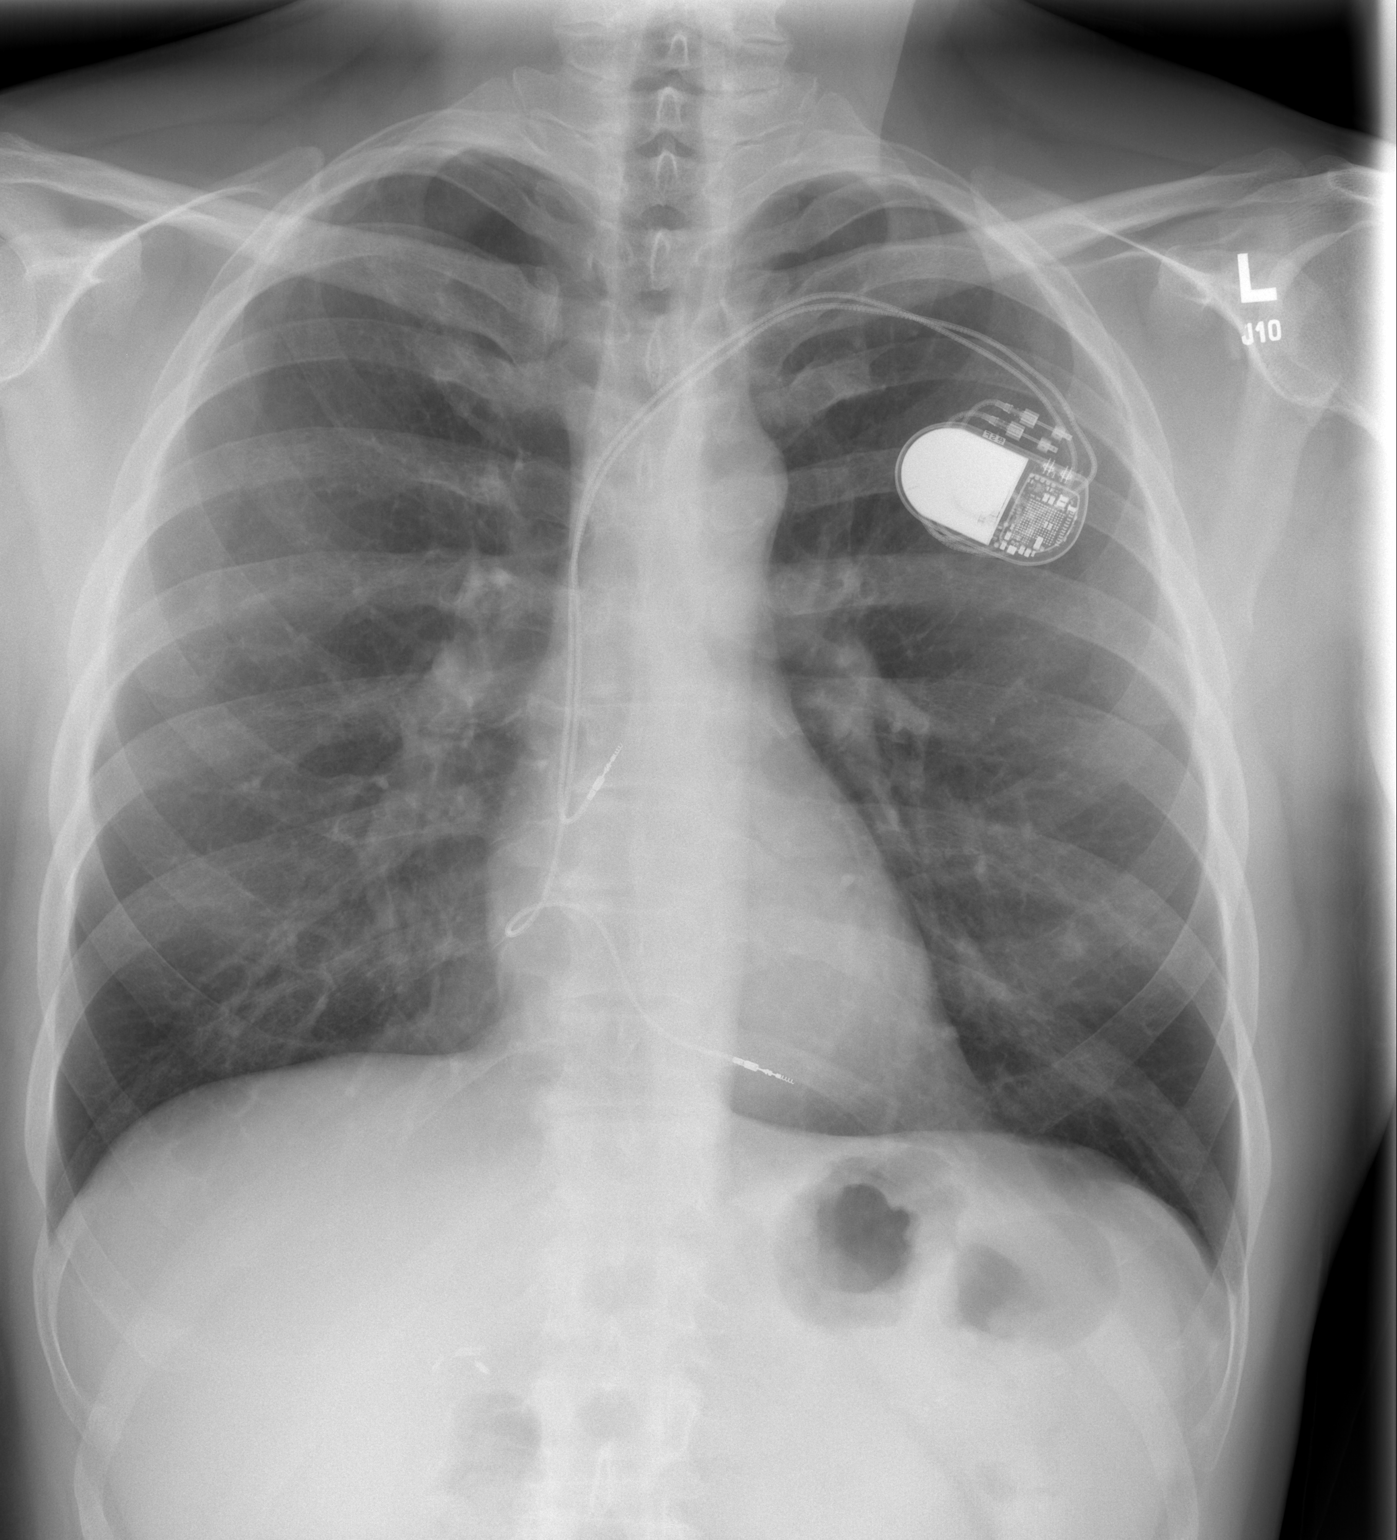

[w chest lat]
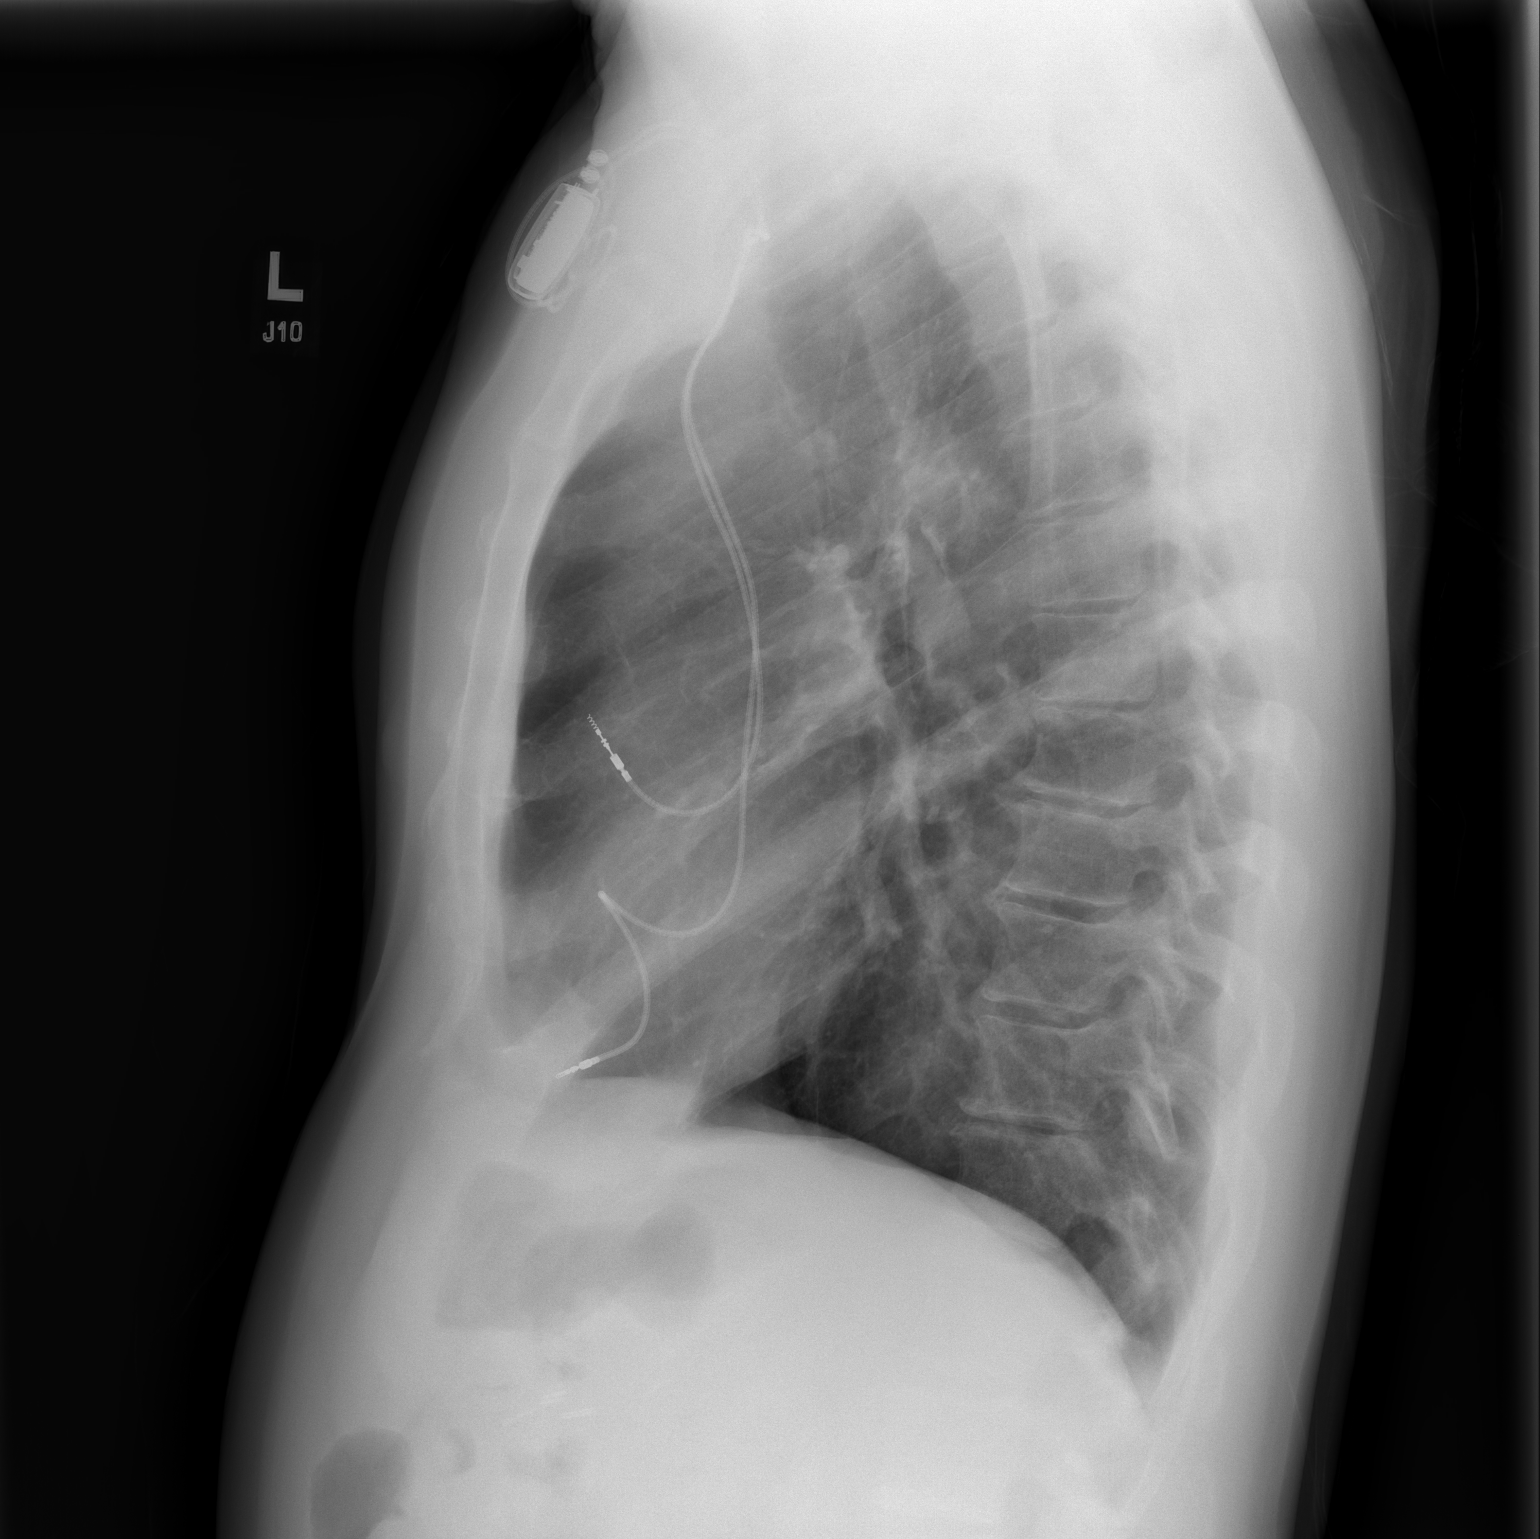

[2 of 2 positions shown; findings below may reference images not displayed]

FINDINGS: Trachea is midline.  Heart size normal.  Pacemaker lead
tips project over the right atrium and right ventricle.  Lungs are
clear.  No pleural fluid.
IMPRESSION: No acute findings.

## 2012-10-10 ENCOUNTER — Other Ambulatory Visit: Payer: Self-pay | Admitting: Internal Medicine

## 2012-10-11 ENCOUNTER — Other Ambulatory Visit: Payer: Self-pay | Admitting: Internal Medicine

## 2012-10-12 ENCOUNTER — Other Ambulatory Visit: Payer: Self-pay | Admitting: Internal Medicine

## 2012-10-19 ENCOUNTER — Other Ambulatory Visit: Payer: Self-pay | Admitting: Family Medicine

## 2012-10-19 NOTE — Telephone Encounter (Signed)
Refill once and f/u with primary if recurrent rash.

## 2012-10-19 NOTE — Telephone Encounter (Signed)
Doxycycline #20 BID, RF 0 given at OV on 08-24-12, pustule issues. This is a Dr Cato Mulligan patient.

## 2012-12-03 ENCOUNTER — Other Ambulatory Visit: Payer: Self-pay | Admitting: Internal Medicine

## 2013-01-20 ENCOUNTER — Encounter: Payer: Self-pay | Admitting: Internal Medicine

## 2013-01-20 ENCOUNTER — Ambulatory Visit (INDEPENDENT_AMBULATORY_CARE_PROVIDER_SITE_OTHER): Payer: Medicare PPO | Admitting: Internal Medicine

## 2013-01-20 VITALS — BP 134/90 | HR 76 | Temp 97.7°F | Ht 68.0 in | Wt 165.0 lb

## 2013-01-20 DIAGNOSIS — I10 Essential (primary) hypertension: Secondary | ICD-10-CM

## 2013-01-20 LAB — BASIC METABOLIC PANEL
BUN: 18 mg/dL (ref 6–23)
CO2: 28 mEq/L (ref 19–32)
Calcium: 9.3 mg/dL (ref 8.4–10.5)
Creatinine, Ser: 1.3 mg/dL (ref 0.4–1.5)
GFR: 60.81 mL/min (ref >60.00)
Glucose, Bld: 97 mg/dL (ref 70–99)
Sodium: 138 mEq/L (ref 135–145)

## 2013-01-20 LAB — CBC WITH DIFFERENTIAL/PLATELET
Basophils Relative: 0.3 % (ref 0.0–3.0)
Eosinophils Relative: 2.3 % (ref 0.0–5.0)
HCT: 43.4 % (ref 39.0–52.0)
Hemoglobin: 14.6 g/dL (ref 13.0–17.0)
Lymphs Abs: 2.1 10*3/uL (ref 0.7–4.0)
MCV: 87.3 fl (ref 78.0–100.0)
Monocytes Absolute: 0.7 10*3/uL (ref 0.1–1.0)
Monocytes Relative: 8.2 % (ref 3.0–12.0)
Neutro Abs: 5 10*3/uL (ref 1.4–7.7)
Platelets: 240 10*3/uL (ref 150.0–400.0)
WBC: 8 10*3/uL (ref 4.5–10.5)

## 2013-01-20 LAB — HEPATIC FUNCTION PANEL
ALT: 32 U/L (ref 0–53)
Alkaline Phosphatase: 70 U/L (ref 39–117)
Bilirubin, Direct: 0.1 mg/dL (ref 0.0–0.3)
Total Bilirubin: 0.9 mg/dL (ref 0.3–1.2)
Total Protein: 6.9 g/dL (ref 6.0–8.3)

## 2013-01-20 LAB — POCT URINALYSIS DIPSTICK
Bilirubin, UA: NEGATIVE
Glucose, UA: NEGATIVE
Ketones, UA: NEGATIVE
Leukocytes, UA: NEGATIVE
Nitrite, UA: NEGATIVE
pH, UA: 5.5

## 2013-01-20 LAB — LIPID PANEL: HDL: 41.5 mg/dL (ref >39.00)

## 2013-01-20 LAB — PSA: PSA: 1.3 ng/mL (ref 0.10–4.00)

## 2013-01-20 MED ORDER — INDOMETHACIN 25 MG PO CAPS: 25.0000 mg | ORAL_CAPSULE | Freq: Every day | ORAL | Status: DC

## 2013-01-20 MED ORDER — BENAZEPRIL HCL 5 MG PO TABS: ORAL_TABLET | ORAL | Status: DC

## 2013-01-20 MED ORDER — OMEPRAZOLE 20 MG PO CPDR: 20.0000 mg | DELAYED_RELEASE_CAPSULE | Freq: Every day | ORAL | Status: DC

## 2013-01-20 NOTE — Progress Notes (Signed)
Patient ID: [name], male   DOB: [birth year], 67 y.o.   MRN: [id number] Pacemaker- doing well  Very active-still swimming  Some back pain- but improves with exercise  Reviewed pmh, psh, sochx,    patient denies chest pain, shortness of breath, orthopnea. Denies lower extremity edema, abdominal pain, change in appetite, change in bowel movements. Patient denies rashes, musculoskeletal complaints. No other specific complaints in a complete review of systems.   Well-developed male in no acute distress. HEENT exam atraumatic, normocephalic, extraocular muscles are intact. Conjunctivae are pink without exudate. Neck is supple without lymphadenopathy, thyromegaly, jugular venous distention. Chest is clear to auscultation without increased work of breathing. Cardiac exam S1-S2 are regular. The PMI is normal. No significant murmurs or gallops. Abdominal exam active bowel sounds, soft, nontender. No abdominal bruits. Extremities no clubbing cyanosis or edema. Peripheral pulses are normal without bruits. Neurologic exam alert and oriented without any motor or sensory deficits. Rectal exam normal tone prostate normal size without masses or asymmetry.

## 2013-01-22 NOTE — Assessment & Plan Note (Signed)
Lab Results  Component Value Date   CHOL 232* 01/20/2013   HDL 41.50 01/20/2013   LDLCALC 107* 08/22/2010   LDLDIRECT 131.7 01/20/2013   TRIG 247.0* 01/20/2013   CHOLHDL 6 01/20/2013   May need to adjust meds

## 2013-01-22 NOTE — Assessment & Plan Note (Signed)
BP Readings from Last 3 Encounters:  01/20/13 134/90  08/24/12 120/70  07/19/12 128/74   Fair control Continue meds

## 2013-01-23 ENCOUNTER — Encounter: Payer: Medicare Other | Admitting: Internal Medicine

## 2013-01-25 ENCOUNTER — Other Ambulatory Visit: Payer: Self-pay | Admitting: *Deleted

## 2013-01-25 ENCOUNTER — Encounter: Payer: Self-pay | Admitting: Internal Medicine

## 2013-01-25 ENCOUNTER — Ambulatory Visit (INDEPENDENT_AMBULATORY_CARE_PROVIDER_SITE_OTHER): Payer: Medicare PPO | Admitting: Internal Medicine

## 2013-01-25 VITALS — BP 133/77 | HR 70 | Ht 68.0 in | Wt 168.8 lb

## 2013-01-25 DIAGNOSIS — R319 Hematuria, unspecified: Secondary | ICD-10-CM

## 2013-01-25 DIAGNOSIS — I495 Sick sinus syndrome: Secondary | ICD-10-CM | POA: Insufficient documentation

## 2013-01-25 DIAGNOSIS — I472 Ventricular tachycardia: Secondary | ICD-10-CM

## 2013-01-25 LAB — PACEMAKER DEVICE OBSERVATION
AL THRESHOLD: 0.5 V
RV LEAD AMPLITUDE: 12.6 mv
RV LEAD THRESHOLD: 1.3 V

## 2013-01-25 NOTE — Assessment & Plan Note (Signed)
His nonsustained ventricular tachycardia is minimally symptomatic. We discussed the general benign nature of this problem in detail. If he were to develop more symptoms, then I would consider antiarrhythmic drug therapy.

## 2013-01-25 NOTE — Assessment & Plan Note (Signed)
His blood pressure has been well-controlled. I've encouraged the patient to maintain a low-sodium diet, continue his regular exercise, and continue his current medical therapy.

## 2013-01-25 NOTE — Progress Notes (Signed)
HPI Mr. Colee returns today for followup. He is a very pleasant middle-age male sinus node dysfunction, status post permanent pacemaker insertion. He also is a history of hypertension which has been well-controlled. He denies chest pain or shortness of breath. He does experience palpitations, and noted his heart rate is at times fast. The patient was recently treated with a closed loop stimulation pacemaker. He remains frustrated about his inability to swim faster. 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 He denies peripheral edema. No syncope. He has occasional palpitations. Allergies  Allergen Reactions  . Crestor (Rosuvastatin Calcium) Other (See Comments)    Muscle ache     Current Outpatient Prescriptions  Medication Sig Dispense Refill  . aspirin 325 MG EC tablet Take 325 mg by mouth daily.        . benazepril (LOTENSIN) 5 MG tablet TAKE ONE TABLET BY MOUTH EVERY DAY  90 tablet  3  . fish oil-omega-3 fatty acids 1000 MG capsule Take 2 g by mouth daily.        Marland Kitchen glucosamine-chondroitin 500-400 MG tablet Take 1 tablet by mouth 3 (three) times daily.      . indomethacin (INDOCIN) 25 MG capsule Take 25 mg by mouth as needed.      Marland Kitchen LIVALO 2 MG TABS TAKE ONE TABLET BY MOUTH EVERY DAY OR AS DIRECTED  30 tablet  2  . Multiple Vitamin (MULTIVITAMIN) tablet Take 1 tablet by mouth daily.        . naproxen (NAPROSYN) 500 MG tablet Take 500 mg by mouth as needed.       Marland Kitchen omeprazole (PRILOSEC) 20 MG capsule Take 20 mg by mouth as needed.       No current facility-administered medications for this visit.     Past Medical History  Diagnosis Date  . HTN (hypertension)   . CVA (cerebral vascular accident)   . Ischemic optic neuropathy   . Other and unspecified hyperlipidemia   . Bradycardia   . Abscess     right posterior neck  . Spinal stenosis   . GERD (gastroesophageal reflux disease)   . External hemorrhoids   . Gout   . Elevated LFTs   . Sinoatrial node dysfunction      ROS:   All systems reviewed and negative except as noted in the HPI.   Past Surgical History  Procedure Laterality Date  . Cardiac catheterization  1996  . Cholecystectomy  02/07/2003  . Colonoscopy  06/23/2004  . Pacemaker insertion    . Eye surgery      left eye  . Abscess drainage      righ tposterior neck  . Left ingunial hernia  06/08/2011     Family History  Problem Relation Age of Onset  . Lung cancer Mother   . Heart attack Mother   . Hypertension Mother   . Heart attack Father   . Heart disease Father   . Diabetes Father   . Diabetes Paternal Grandfather      History   Social History  . Marital Status: Married    Spouse Name: N/A    Number of Children: N/A  . Years of Education: N/A   Occupational History  . Not on file.   Social History Main Topics  . Smoking status: Former Games developer  . Smokeless tobacco: Never Used  . Alcohol Use: No  . Drug Use: No  . Sexually Active: Not on file   Other Topics Concern  . Not on  file   Social History Narrative  . No narrative on file     BP 133/77  Pulse 70  Ht 5\' 8"  (1.727 m)  Wt 168 lb 12.8 oz (76.567 kg)  BMI 25.67 kg/m2  Physical Exam:  Well appearing middle-aged man,NAD HEENT: Unremarkable Neck:  No JVD, no thyromegally Lungs:  Clear with no wheezes, rales, or rhonchi. HEART:  Regular rate rhythm, no murmurs, no rubs, no clicks Abd:  soft, positive bowel sounds, no organomegally, no rebound, no guarding Ext:  2 plus pulses, no edema, no cyanosis, no clubbing Skin:  No rashes no nodules Neuro:  CN II through XII intact, motor grossly intact  EKG normal sinus rhythm with atrial pacing  DEVICE  Normal device function.  See PaceArt for details.   Assess/Plan:

## 2013-01-25 NOTE — Assessment & Plan Note (Signed)
His Biotronik dual-chamber pacemaker is working normally. We'll plan to recheck in several months.

## 2013-01-25 NOTE — Patient Instructions (Addendum)
Your physician recommends that you continue on your current medications as directed. Please refer to the Current Medication list given to you today.  Your physician wants you to follow-up in: 1 year with Dr. Taylor.  You will receive a reminder letter in the mail two months in advance. If you don't receive a letter, please call our office to schedule the follow-up appointment.  

## 2013-01-26 ENCOUNTER — Other Ambulatory Visit (INDEPENDENT_AMBULATORY_CARE_PROVIDER_SITE_OTHER): Payer: Medicare PPO

## 2013-01-26 ENCOUNTER — Telehealth: Payer: Self-pay | Admitting: Internal Medicine

## 2013-01-26 LAB — POCT URINALYSIS DIPSTICK
Leukocytes, UA: NEGATIVE
Protein, UA: NEGATIVE
Urobilinogen, UA: 0.2

## 2013-01-31 ENCOUNTER — Telehealth: Payer: Self-pay | Admitting: Internal Medicine

## 2013-01-31 NOTE — Telephone Encounter (Signed)
Pt wants to know if he could back to Crestor

## 2013-01-31 NOTE — Telephone Encounter (Signed)
Patient's spouse called stating that she would like to have a 90 day refill of livalo 2 mg 1poqd sent to [facility] on [address]. Please assist.

## 2013-02-01 NOTE — Telephone Encounter (Signed)
Yes.  Let's try very low dose crestor 10 mg, 1/2 pill every other day

## 2013-02-02 MED ORDER — ROSUVASTATIN CALCIUM 10 MG PO TABS: 5.0000 mg | ORAL_TABLET | Freq: Every day | ORAL | Status: DC

## 2013-02-02 NOTE — Telephone Encounter (Signed)
Pt wants Dr Cato Mulligan to pay attention to his cholesterol.  Per Dr Cato Mulligan crestor has caused aches and pains in the past and he wants pt to start off slow with crestor.  If pt wants to take 1/2 pill every day then he can but he needs recheck cholesterol.  Called pt and pt said to call in for 1/2 tab once daily and if he needs to back down to qod then he will.  Sent rx in to [facility] on [address]

## 2013-02-06 ENCOUNTER — Encounter: Payer: Self-pay | Admitting: Internal Medicine

## 2013-02-06 ENCOUNTER — Ambulatory Visit (INDEPENDENT_AMBULATORY_CARE_PROVIDER_SITE_OTHER): Payer: Medicare PPO

## 2013-02-06 DIAGNOSIS — R0989 Other specified symptoms and signs involving the circulatory and respiratory systems: Secondary | ICD-10-CM

## 2013-02-13 LAB — PACEMAKER DEVICE OBSERVATION
AL IMPEDENCE PM: 409 Ohm
ATRIAL PACING PM: 100
DEVICE MODEL PM: 66063261
RV LEAD IMPEDENCE PM: 429 Ohm

## 2013-07-04 NOTE — Telephone Encounter (Signed)
Close encounter 

## 2013-07-11 ENCOUNTER — Other Ambulatory Visit: Payer: Self-pay | Admitting: *Deleted

## 2013-07-11 MED ORDER — BENAZEPRIL HCL 5 MG PO TABS: ORAL_TABLET | ORAL | Status: DC

## 2013-07-11 NOTE — Addendum Note (Signed)
Addended by: Alfred Levins D on: 07/11/2013 04:33 PM   Modules accepted: Orders

## 2013-08-28 ENCOUNTER — Telehealth: Payer: Self-pay | Admitting: *Deleted

## 2013-08-28 NOTE — Telephone Encounter (Signed)
Pt would like a new rx for ambien.  [facility] [address]

## 2013-08-29 MED ORDER — ZOLPIDEM TARTRATE 5 MG PO TABS: 5.0000 mg | ORAL_TABLET | Freq: Every evening | ORAL | Status: DC | PRN

## 2013-08-29 NOTE — Telephone Encounter (Signed)
rx called in, instructed pt that it has to last 30 days, pt aware

## 2013-08-29 NOTE — Telephone Encounter (Signed)
ambien 5mg  1 po qhs prn insomnia #10/1 refill

## 2013-09-14 ENCOUNTER — Ambulatory Visit (INDEPENDENT_AMBULATORY_CARE_PROVIDER_SITE_OTHER): Payer: Medicare PPO

## 2013-09-14 DIAGNOSIS — Z23 Encounter for immunization: Secondary | ICD-10-CM

## 2013-09-21 ENCOUNTER — Encounter: Payer: Self-pay | Admitting: Internal Medicine

## 2013-09-21 ENCOUNTER — Ambulatory Visit (INDEPENDENT_AMBULATORY_CARE_PROVIDER_SITE_OTHER): Payer: Medicare PPO | Admitting: Internal Medicine

## 2013-09-21 ENCOUNTER — Telehealth: Payer: Self-pay | Admitting: *Deleted

## 2013-09-21 VITALS — BP 124/80 | HR 66 | Temp 97.8°F | Wt 170.0 lb

## 2013-09-21 DIAGNOSIS — R0600 Dyspnea, unspecified: Secondary | ICD-10-CM

## 2013-09-21 DIAGNOSIS — I10 Essential (primary) hypertension: Secondary | ICD-10-CM

## 2013-09-21 DIAGNOSIS — R0609 Other forms of dyspnea: Secondary | ICD-10-CM

## 2013-09-21 DIAGNOSIS — E785 Hyperlipidemia, unspecified: Secondary | ICD-10-CM

## 2013-09-21 DIAGNOSIS — I495 Sick sinus syndrome: Secondary | ICD-10-CM

## 2013-09-21 DIAGNOSIS — R002 Palpitations: Secondary | ICD-10-CM

## 2013-09-21 LAB — CBC WITH DIFFERENTIAL/PLATELET
Basophils Absolute: 0 10*3/uL (ref 0.0–0.1)
Basophils Relative: 0.4 % (ref 0.0–3.0)
Eosinophils Absolute: 0.5 10*3/uL (ref 0.0–0.7)
Eosinophils Relative: 5.8 % — ABNORMAL HIGH (ref 0.0–5.0)
Hemoglobin: 15.4 g/dL (ref 13.0–17.0)
Lymphocytes Relative: 28.8 % (ref 12.0–46.0)
MCHC: 34.7 g/dL (ref 30.0–36.0)
Monocytes Relative: 8.5 % (ref 3.0–12.0)
Neutro Abs: 4.5 10*3/uL (ref 1.4–7.7)
RBC: 5.11 Mil/uL (ref 4.22–5.81)
RDW: 13.7 % (ref 11.5–14.6)
WBC: 8 10*3/uL (ref 4.5–10.5)

## 2013-09-21 LAB — HEPATIC FUNCTION PANEL
ALT: 33 U/L (ref 0–53)
AST: 23 U/L (ref 0–37)
Albumin: 4 g/dL (ref 3.5–5.2)
Total Protein: 7.2 g/dL (ref 6.0–8.3)

## 2013-09-21 LAB — BASIC METABOLIC PANEL
BUN: 13 mg/dL (ref 6–23)
CO2: 28 mEq/L (ref 19–32)
Calcium: 9.4 mg/dL (ref 8.4–10.5)
Chloride: 103 mEq/L (ref 96–112)
GFR: 63.59 mL/min (ref >60.00)
Potassium: 4.5 mEq/L (ref 3.5–5.1)
Sodium: 140 mEq/L (ref 135–145)

## 2013-09-21 LAB — LIPID PANEL
Cholesterol: 183 mg/dL (ref 0–200)
HDL: 43.7 mg/dL (ref >39.00)
Total CHOL/HDL Ratio: 4
Triglycerides: 373 mg/dL — ABNORMAL HIGH (ref 0.0–149.0)

## 2013-09-21 LAB — LDL CHOLESTEROL, DIRECT: Direct LDL: 96.1 mg/dL

## 2013-09-21 NOTE — Progress Notes (Signed)
Feels exhausted Sometimes SOB even at rest.  Has palpitations (120 bpm x 15 seconds) Patient continues to swim some. He no shortness of breath with swimming. He is unable to swim nearly as long as he used to.  Occasional burning sensation- only happens at rest  Belching at times - otherwise GERD is well controlled.  Reviewed past medical history, medications, social history, family history.  Review of systems: Patient has some aches and pains related to hip replacement. No other specific complaints.  Physical exam vital signs reviewed. Heart rate 75. Cardiac exam S1-S2 are regular. Abdominal exam active bowel sounds, soft and nontender. Chest is clear to auscultation. Extremities no edema.

## 2013-09-22 ENCOUNTER — Ambulatory Visit (INDEPENDENT_AMBULATORY_CARE_PROVIDER_SITE_OTHER): Payer: Medicare PPO | Admitting: *Deleted

## 2013-09-22 DIAGNOSIS — I495 Sick sinus syndrome: Secondary | ICD-10-CM

## 2013-09-22 LAB — PACEMAKER DEVICE OBSERVATION
AL AMPLITUDE: 4.7 mv
ATRIAL PACING PM: 100
DEVICE MODEL PM: 66063261
RV LEAD THRESHOLD: 1.2 V

## 2013-09-22 NOTE — Telephone Encounter (Signed)
Scheduled pt to be checked on 09/22/13 per Dr. Elissa Hefty of rate response, palpitations, & dyspnea.

## 2013-09-22 NOTE — Assessment & Plan Note (Signed)
He has multiple aches and pains. I've asked him to discontinue a statin for the time being.

## 2013-09-22 NOTE — Progress Notes (Signed)
Pacemaker check in clinic. Normal device function. Thresholds, sensing, impedances consistent with previous measurements. Device programmed to maximize longevity.3 mode switches , shows 1:1 conduction.  1 high ventricular rates noted. Device programmed at appropriate safety margins. Histogram distribution appropriate for patient activity level. Device programmed to optimize intrinsic conduction. Estimated longevity 7.10 years. Patient education completed.  Checked by [employer].

## 2013-09-22 NOTE — Assessment & Plan Note (Signed)
Patient presents with dyspnea, shortness of breath and had a pacemaker placed for sinoatrial node dysfunction. His palpitations sound like SVT. It's unclear to me whether this is contributing to his shortness of breath that is the most objective symptom he has. I've reviewed previous stress test. I don't think he needs further stress testing or evaluation for coronary disease at this time. I called the pacemaker clinic. They will interrogate his pacemaker.  I'll make further decisions after that. He may need to see cardiology.

## 2013-09-26 ENCOUNTER — Ambulatory Visit (INDEPENDENT_AMBULATORY_CARE_PROVIDER_SITE_OTHER): Payer: Medicare PPO | Admitting: Internal Medicine

## 2013-09-26 ENCOUNTER — Encounter: Payer: Self-pay | Admitting: Internal Medicine

## 2013-09-26 VITALS — BP 120/88 | HR 68 | Ht 68.0 in | Wt 166.0 lb

## 2013-09-26 DIAGNOSIS — R0600 Dyspnea, unspecified: Secondary | ICD-10-CM | POA: Insufficient documentation

## 2013-09-26 DIAGNOSIS — Z950 Presence of cardiac pacemaker: Secondary | ICD-10-CM

## 2013-09-26 DIAGNOSIS — R0609 Other forms of dyspnea: Secondary | ICD-10-CM

## 2013-09-26 DIAGNOSIS — R0602 Shortness of breath: Secondary | ICD-10-CM

## 2013-09-26 NOTE — Assessment & Plan Note (Signed)
His symptoms are likely multifactorial but it is still unclear to me which is predominant. He has a strong family history of heart disease and his father died in his 37's. Will undergo regular exercise treadmill test as I would like to see what his PM rate response does with exertion. It may need adjustment. Would also like to better understand his blood pressure with exercise.

## 2013-09-26 NOTE — Assessment & Plan Note (Signed)
His Biotronik PM appears to be working normally with acceptable pacing thresholds.

## 2013-09-26 NOTE — Patient Instructions (Signed)
Your physician has requested that you have an exercise tolerance test. For further information please visit [email or web address]. Please also follow instruction sheet, as given.   

## 2013-09-26 NOTE — Progress Notes (Signed)
HPI Mr. Danielsen returns today for followup. He is a very pleasant middle-age male sinus node dysfunction, status post permanent pacemaker insertion. He also is a history of hypertension which has been well-controlled. He denies chest pain. He does experience palpitations, and noted his heart rate is at times fast. He is concerned about exertional dyspnea. The patient was recently treated with a closed loop stimulation pacemaker. He remains frustrated about his inability to swim faster. 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 He denies peripheral edema. No syncope. He has occasional palpitations. Allergies  Allergen Reactions  . Crestor [Rosuvastatin Calcium] Other (See Comments)    Muscle ache     Current Outpatient Prescriptions  Medication Sig Dispense Refill  . aspirin 325 MG EC tablet Take 325 mg by mouth daily.        . benazepril (LOTENSIN) 5 MG tablet TAKE ONE TABLET BY MOUTH EVERY DAY  90 tablet  3  . fish oil-omega-3 fatty acids 1000 MG capsule Take 2 g by mouth daily.        Marland Kitchen glucosamine-chondroitin 500-400 MG tablet Take 1 tablet by mouth 3 (three) times daily.      . indomethacin (INDOCIN) 25 MG capsule Take 25 mg by mouth as needed.      . Multiple Vitamin (MULTIVITAMIN) tablet Take 1 tablet by mouth daily.        . naproxen (NAPROSYN) 500 MG tablet Take 500 mg by mouth as needed.       Marland Kitchen omeprazole (PRILOSEC) 20 MG capsule Take 20 mg by mouth as needed.      . rosuvastatin (CRESTOR) 10 MG tablet Take 0.5 tablets (5 mg total) by mouth daily.  45 tablet  3  . zolpidem (AMBIEN) 5 MG tablet Take 1 tablet (5 mg total) by mouth at bedtime as needed for sleep.  10 tablet  1   No current facility-administered medications for this visit.     Past Medical History  Diagnosis Date  . HTN (hypertension)   . CVA (cerebral vascular accident)   . Ischemic optic neuropathy   . Other and unspecified hyperlipidemia   . Bradycardia   . Abscess     right posterior neck  . Spinal  stenosis   . GERD (gastroesophageal reflux disease)   . External hemorrhoids   . Gout   . Elevated LFTs   . Sinoatrial node dysfunction     ROS:   All systems reviewed and negative except as noted in the HPI.   Past Surgical History  Procedure Laterality Date  . Cardiac catheterization  1996  . Cholecystectomy  02/07/2003  . Colonoscopy  06/23/2004  . Pacemaker insertion    . Eye surgery      left eye  . Abscess drainage      righ tposterior neck  . Left ingunial hernia  06/08/2011     Family History  Problem Relation Age of Onset  . Lung cancer Mother   . Heart attack Mother   . Hypertension Mother   . Heart attack Father   . Heart disease Father   . Diabetes Father   . Diabetes Paternal Grandfather      History   Social History  . Marital Status: Married    Spouse Name: N/A    Number of Children: N/A  . Years of Education: N/A   Occupational History  . Not on file.   Social History Main Topics  . Smoking status: Former Games developer  . Smokeless tobacco:  Never Used  . Alcohol Use: No  . Drug Use: No  . Sexual Activity: Not on file   Other Topics Concern  . Not on file   Social History Narrative  . No narrative on file     BP 120/88  Pulse 68  Ht 5\' 8"  (1.727 m)  Wt 166 lb (75.297 kg)  BMI 25.25 kg/m2  SpO2 97%  Physical Exam:  Well appearing middle-aged man,NAD HEENT: Unremarkable Neck:  No JVD, no thyromegally Lungs:  Clear with no wheezes, rales, or rhonchi. HEART:  Regular rate rhythm, no murmurs, no rubs, no clicks Abd:  soft, positive bowel sounds, no organomegally, no rebound, no guarding Ext:  2 plus pulses, no edema, no cyanosis, no clubbing Skin:  No rashes no nodules Neuro:  CN II through XII intact, motor grossly intact   DEVICE  Normal device function.  See PaceArt for details.   Assess/Plan:

## 2013-09-27 ENCOUNTER — Encounter: Payer: Self-pay | Admitting: Internal Medicine

## 2013-10-17 ENCOUNTER — Ambulatory Visit (INDEPENDENT_AMBULATORY_CARE_PROVIDER_SITE_OTHER): Payer: Medicare PPO | Admitting: Family Medicine

## 2013-10-17 ENCOUNTER — Encounter: Payer: Self-pay | Admitting: Family Medicine

## 2013-10-17 VITALS — BP 134/68 | Temp 98.5°F | Wt 168.0 lb

## 2013-10-17 DIAGNOSIS — N419 Inflammatory disease of prostate, unspecified: Secondary | ICD-10-CM

## 2013-10-17 LAB — POCT URINALYSIS DIPSTICK
Glucose, UA: NEGATIVE
Nitrite, UA: NEGATIVE
Spec Grav, UA: 1.025
Urobilinogen, UA: 0.2
pH, UA: 6

## 2013-10-17 MED ORDER — CIPROFLOXACIN HCL 500 MG PO TABS: 500.0000 mg | ORAL_TABLET | Freq: Two times a day (BID) | ORAL | Status: DC

## 2013-10-17 NOTE — Addendum Note (Signed)
Addended by: Aniceto Boss A on: 10/17/2013 05:07 PM   Modules accepted: Orders

## 2013-10-17 NOTE — Progress Notes (Signed)
  Subjective:    Patient ID: [name], male    DOB: 1946/09/02, 67 y.o.   MRN: [id number]  HPI Here for one week of mild symptoms like chills, fatigue, muscle aches, HA, and nausea without vomiting. No ST or cough. No urinary sx. BMs are normal.    Review of Systems  Constitutional: Positive for chills and fatigue. Negative for fever and diaphoresis.  HENT: Negative.   Eyes: Negative.   Respiratory: Negative.   Cardiovascular: Negative.   Gastrointestinal: Positive for nausea. Negative for vomiting, abdominal pain, diarrhea, constipation, blood in stool, abdominal distention, anal bleeding and rectal pain.  Genitourinary: Negative.   Neurological: Negative.        Objective:   Physical Exam  Constitutional: He is oriented to person, place, and time. He appears well-developed and well-nourished. No distress.  Eyes: Conjunctivae are normal. Pupils are equal, round, and reactive to light.  Neck: Neck supple. No thyromegaly present.  Cardiovascular: Normal rate, regular rhythm, normal heart sounds and intact distal pulses.   Pulmonary/Chest: Effort normal and breath sounds normal.  Abdominal: Soft. Bowel sounds are normal. He exhibits no distension and no mass. There is no tenderness. There is no rebound and no guarding.  Genitourinary:  Prostate is mildly swollen and tender   Lymphadenopathy:    He has no cervical adenopathy.  Neurological: He is alert and oriented to person, place, and time.          Assessment & Plan:  This is most likely a low level prostatitis. Treat with Cipro. Drink fluids. If this does not resolve, he will follow up with Dr. Cato Mulligan.

## 2013-10-17 NOTE — Progress Notes (Signed)
Pre visit review using our clinic review tool, if applicable. No additional management support is needed unless otherwise documented below in the visit note. 

## 2013-10-18 ENCOUNTER — Encounter: Payer: Medicare PPO | Admitting: Internal Medicine

## 2013-10-31 ENCOUNTER — Encounter: Payer: Self-pay | Admitting: Gastroenterology

## 2013-11-03 ENCOUNTER — Ambulatory Visit (INDEPENDENT_AMBULATORY_CARE_PROVIDER_SITE_OTHER): Payer: Medicare PPO | Admitting: Gastroenterology

## 2013-11-03 ENCOUNTER — Encounter: Payer: Self-pay | Admitting: Gastroenterology

## 2013-11-03 VITALS — BP 110/70 | HR 74 | Ht 68.0 in | Wt 166.0 lb

## 2013-11-03 DIAGNOSIS — R195 Other fecal abnormalities: Secondary | ICD-10-CM

## 2013-11-03 NOTE — Progress Notes (Signed)
Reviewed, I agree with stopping Mg2+ and reassessing in 2-3 weeks- pt should call back or we should call him back if he does not call.

## 2013-11-03 NOTE — Patient Instructions (Signed)
Stop the Magnesium and call if no better in a week. CC:  Nathan Sons MD

## 2013-11-03 NOTE — Progress Notes (Signed)
11/03/2013 [name] [id number] 1946/11/18   History of Present Illness:  This is a pleasant 67 year old male who is known to Dr. Juanda Chance.  He presents to our office today with complaints of loose stools for the past 2-3 months. He states that this has not diarrhea but just loose bowel movements and occurring more frequently, approximately 5 times per day. He is not having any bowel movements at night. He denies seeing any blood in his stool. He denies any abdominal pain or cramping and there is no urgency.  Having frequent bowel movements is causing his bottom to be sore and his external hemorrhoid to be irritated, but once again has not seen any blood. It has gotten to the point that when he is urinating sometimes he feels as if he might have an accident with a bowel movement the same time. Patient cannot recall any antibiotic use prior to the onset of his symptoms. He did travel to [place] prior to his symptoms beginning. When I was reviewing his medications he said that he began taking Magnesium tablets once a day and has been taking this for the last few months. He immediately realized that this may be the source of his issue.  He is s/p cholecystectomy.  He does not necessarily correlate worsening of his symptoms with any particular foods. His last colonoscopy was in April of 2011 at which time he only had external hemorrhoids and a repeat procedure was recommended in 10 years from that time.   Current Medications, Allergies, Past Medical History, Past Surgical History, Family History and Social History were reviewed in [employer] record.   Physical Exam: BP 110/70  Pulse 74  Ht 5\' 8"  (1.727 m)  Wt 166 lb (75.297 kg)  BMI 25.25 kg/m2 General: Well developed, white male in no acute distress Head: Normocephalic and atraumatic Eyes:  Sclerae anicteric, conjunctiva pink  Ears: Normal auditory acuity Lungs: Clear throughout to auscultation Heart: Regular rate and  rhythm Abdomen: Soft, non-distended.  BS present.  Non-tender. Musculoskeletal: Symmetrical with no gross deformities  Extremities: No edema.  Neurological: Alert oriented x 4, grossly non-focal. Psychological:  Alert and cooperative. Normal mood and affect  Assessment and Recommendations: -Loose stools:  This could very well be from the magnesium pills that he started taking within the last few months. As the first step in evaluation and treatment I will just have him discontinue the magnesium to see if his loose stools resolve.  He was advised that if after a week or so he is still having loose stools then he will call our office and we will check labs celiac labs and stool studies. He may also have some bile salt related stools s/p cholecystectomy.

## 2013-12-08 ENCOUNTER — Ambulatory Visit (INDEPENDENT_AMBULATORY_CARE_PROVIDER_SITE_OTHER): Payer: Medicare PPO | Admitting: Internal Medicine

## 2013-12-08 ENCOUNTER — Encounter (INDEPENDENT_AMBULATORY_CARE_PROVIDER_SITE_OTHER): Payer: Self-pay

## 2013-12-08 DIAGNOSIS — R0602 Shortness of breath: Secondary | ICD-10-CM

## 2013-12-08 NOTE — Progress Notes (Signed)
Exercise Treadmill Test  Pre-Exercise Testing Evaluation Rhythm: normal sinus  Rate: 60 bpm     Test  Exercise Tolerance Test Ordering MD: Cristopher Peru, MD  Interpreting MD: Cristopher Peru, MD  Unique Test No: 1  Treadmill:  1  Indication for ETT: exertional dyspnea  Contraindication to ETT: No   Stress Modality: exercise - treadmill  Cardiac Imaging Performed: non   Protocol: standard Bruce - maximal  Max BP:  192/90  Max MPHR (bpm):  153 85% MPR (bpm):  130  MPHR obtained (bpm):  151 % MPHR obtained:  98  Reached 85% MPHR (min:sec):  4:33 Total Exercise Time (min-sec):  10:20  Workload in METS:  12.2 Borg Scale: 15  Reason ETT Terminated:  fatigue    ST Segment Analysis At Rest: normal ST segments - no evidence of significant ST depression With Exercise: no evidence of significant ST depression  Other Information Arrhythmia:  No Angina during ETT:  absent (0) Quality of ETT:  diagnostic  ETT Interpretation:  normal - no evidence of ischemia by ST analysis  Comments: Appropriate heart rate response to exercise  Recommendations: Continue regular exercise

## 2013-12-19 ENCOUNTER — Other Ambulatory Visit: Payer: Self-pay | Admitting: *Deleted

## 2013-12-19 DIAGNOSIS — G4700 Insomnia, unspecified: Secondary | ICD-10-CM

## 2013-12-19 MED ORDER — ZOLPIDEM TARTRATE 5 MG PO TABS: 5.0000 mg | ORAL_TABLET | Freq: Every evening | ORAL | Status: DC | PRN

## 2014-02-07 ENCOUNTER — Other Ambulatory Visit: Payer: Self-pay | Admitting: Internal Medicine

## 2014-03-29 ENCOUNTER — Other Ambulatory Visit: Payer: Self-pay | Admitting: *Deleted

## 2014-03-29 MED ORDER — INDOMETHACIN 25 MG PO CAPS: 25.0000 mg | ORAL_CAPSULE | Freq: Two times a day (BID) | ORAL | Status: DC

## 2014-04-17 ENCOUNTER — Ambulatory Visit (INDEPENDENT_AMBULATORY_CARE_PROVIDER_SITE_OTHER): Payer: Medicare PPO | Admitting: Internal Medicine

## 2014-04-17 ENCOUNTER — Encounter: Payer: Self-pay | Admitting: Internal Medicine

## 2014-04-17 VITALS — BP 120/90 | HR 64 | Temp 97.9°F | Ht 68.0 in | Wt 166.0 lb

## 2014-04-17 DIAGNOSIS — R7309 Other abnormal glucose: Secondary | ICD-10-CM

## 2014-04-17 DIAGNOSIS — H47019 Ischemic optic neuropathy, unspecified eye: Secondary | ICD-10-CM

## 2014-04-17 DIAGNOSIS — I10 Essential (primary) hypertension: Secondary | ICD-10-CM

## 2014-04-17 DIAGNOSIS — E785 Hyperlipidemia, unspecified: Secondary | ICD-10-CM

## 2014-04-17 DIAGNOSIS — M25519 Pain in unspecified shoulder: Secondary | ICD-10-CM

## 2014-04-17 DIAGNOSIS — R739 Hyperglycemia, unspecified: Secondary | ICD-10-CM

## 2014-04-17 DIAGNOSIS — M25512 Pain in left shoulder: Secondary | ICD-10-CM

## 2014-04-17 LAB — HEPATIC FUNCTION PANEL
ALT: 23 U/L (ref 0–53)
AST: 18 U/L (ref 0–37)
Albumin: 3.9 g/dL (ref 3.5–5.2)
Alkaline Phosphatase: 55 U/L (ref 39–117)
BILIRUBIN DIRECT: 0 mg/dL (ref 0.0–0.3)
Total Bilirubin: 0.7 mg/dL (ref 0.2–1.2)
Total Protein: 6.6 g/dL (ref 6.0–8.3)

## 2014-04-17 LAB — BASIC METABOLIC PANEL
BUN: 18 mg/dL (ref 6–23)
CO2: 27 mEq/L (ref 19–32)
Calcium: 9.2 mg/dL (ref 8.4–10.5)
Chloride: 106 mEq/L (ref 96–112)
Creatinine, Ser: 1.2 mg/dL (ref 0.4–1.5)
GFR: 65.35 mL/min (ref >60.00)
GLUCOSE: 96 mg/dL (ref 70–99)
Potassium: 4.7 mEq/L (ref 3.5–5.1)
SODIUM: 140 meq/L (ref 135–145)

## 2014-04-17 LAB — LIPID PANEL
Cholesterol: 173 mg/dL (ref 0–200)
HDL: 40.7 mg/dL (ref >39.00)
LDL Cholesterol: 60 mg/dL (ref 0–99)
Total CHOL/HDL Ratio: 4
Triglycerides: 364 mg/dL — ABNORMAL HIGH (ref 0.0–149.0)
VLDL: 72.8 mg/dL — AB (ref 0.0–40.0)

## 2014-04-17 LAB — HEMOGLOBIN A1C: HEMOGLOBIN A1C: 5.4 % (ref 4.6–6.5)

## 2014-04-17 NOTE — Progress Notes (Signed)
Pre visit review using our clinic review tool, if applicable. No additional management support is needed unless otherwise documented below in the visit note. 

## 2014-04-19 NOTE — Progress Notes (Signed)
Has some left shoulder pain.. Pain may start at neck and radiate. Duration 3-6 months. Only painful with moving shoulder  States he doesn't swim as fast.   Lipids- tolerating meds but he notes some bilateral thigh pain.  Past Medical History  Diagnosis Date  . HTN (hypertension)   . CVA (cerebral vascular accident)   . Ischemic optic neuropathy   . Other and unspecified hyperlipidemia   . Bradycardia   . Abscess     right posterior neck  . Spinal stenosis   . GERD (gastroesophageal reflux disease)   . External hemorrhoids   . Gout   . Elevated LFTs   . Sinoatrial node dysfunction     History   Social History  . Marital Status: Married    Spouse Name: N/A    Number of Children: N/A  . Years of Education: N/A   Occupational History  . Not on file.   Social History Main Topics  . Smoking status: Former Smoker    Quit date: 11/04/1991  . Smokeless tobacco: Never Used  . Alcohol Use: Yes     Comment: occ  . Drug Use: No  . Sexual Activity: Not on file   Other Topics Concern  . Not on file   Social History Narrative  . No narrative on file    Past Surgical History  Procedure Laterality Date  . Cardiac catheterization  1996  . Cholecystectomy  02/07/2003  . Colonoscopy  06/23/2004  . Pacemaker insertion    . Eye surgery      left eye  . Abscess drainage      righ tposterior neck  . Left ingunial hernia  06/08/2011    Family History  Problem Relation Age of Onset  . Lung cancer Mother   . Heart attack Mother   . Hypertension Mother   . Heart attack Father   . Heart disease Father   . Diabetes Father   . Diabetes Paternal Grandfather     Allergies  Allergen Reactions  . Crestor [Rosuvastatin Calcium] Other (See Comments)    Muscle ache    Current Outpatient Prescriptions on File Prior to Visit  Medication Sig Dispense Refill  . aspirin 325 MG EC tablet Take 325 mg by mouth daily.        . benazepril (LOTENSIN) 5 MG tablet TAKE ONE TABLET BY  MOUTH EVERY DAY  90 tablet  3  . CRESTOR 10 MG tablet TAKE ONE-HALF TABLET BY MOUTH ONCE DAILY.  45 tablet  0  . fish oil-omega-3 fatty acids 1000 MG capsule Take 2 g by mouth daily.        Marland Kitchen glucosamine-chondroitin 500-400 MG tablet Take 1 tablet by mouth 3 (three) times daily.      . indomethacin (INDOCIN) 25 MG capsule Take 1 capsule (25 mg total) by mouth 2 (two) times daily with a meal.  60 capsule  3  . Multiple Vitamin (MULTIVITAMIN) tablet Take 1 tablet by mouth daily.        . naproxen (NAPROSYN) 500 MG tablet Take 500 mg by mouth as needed.       . zolpidem (AMBIEN) 5 MG tablet Take 1 tablet (5 mg total) by mouth at bedtime as needed.  30 tablet  3  . omeprazole (PRILOSEC) 20 MG capsule Take 20 mg by mouth as needed.       No current facility-administered medications on file prior to visit.     patient denies chest pain, shortness of breath,  orthopnea. Denies lower extremity edema, abdominal pain, change in appetite, change in bowel movements. Patient denies rashes, musculoskeletal complaints. No other specific complaints in a complete review of systems.   BP 120/90  Pulse 64  Temp(Src) 97.9 F (36.6 C) (Oral)  Ht 5\' 8"  (1.727 m)  Wt 166 lb (75.297 kg)  BMI 25.25 kg/m2  SpO2 98%  well-developed well-nourished male in no acute distress. HEENT exam atraumatic, normocephalic, neck supple without jugular venous distention. Chest clear to auscultation cardiac exam S1-S2 are regular. Abdominal exam overweight with bowel sounds, soft and nontender. Extremities no edema. Neurologic exam is alert with a normal gait.  HYPERLIPIDEMIA i don't think his leg pain is statin induced Stay on crestor Check labs  HYPERTENSION Well controlled Continue meds  NEUROPATHY, ISCHEMIC OPTIC No recurrence

## 2014-04-19 NOTE — Assessment & Plan Note (Signed)
i don't think his leg pain is statin induced Stay on crestor Check labs

## 2014-04-19 NOTE — Assessment & Plan Note (Signed)
No recurrence. 

## 2014-04-19 NOTE — Assessment & Plan Note (Signed)
Well controlled Continue meds 

## 2014-05-04 ENCOUNTER — Encounter: Payer: Self-pay | Admitting: *Deleted

## 2014-05-08 ENCOUNTER — Other Ambulatory Visit: Payer: Self-pay | Admitting: Internal Medicine

## 2014-06-20 ENCOUNTER — Encounter: Payer: Self-pay | Admitting: *Deleted

## 2014-07-02 ENCOUNTER — Ambulatory Visit (INDEPENDENT_AMBULATORY_CARE_PROVIDER_SITE_OTHER): Payer: Medicare PPO | Admitting: *Deleted

## 2014-07-02 DIAGNOSIS — I495 Sick sinus syndrome: Secondary | ICD-10-CM

## 2014-07-02 LAB — MDC_IDC_ENUM_SESS_TYPE_INCLINIC
Brady Statistic RA Percent Paced: 99 %
Implantable Pulse Generator Model: 359524
Implantable Pulse Generator Serial Number: 66063261
Lead Channel Impedance Value: 409 Ohm
Lead Channel Pacing Threshold Amplitude: 1.3 V
Lead Channel Pacing Threshold Pulse Width: 0.4 ms
Lead Channel Pacing Threshold Pulse Width: 0.4 ms
Lead Channel Sensing Intrinsic Amplitude: 12.2 mV
Lead Channel Setting Pacing Amplitude: 2.4 V
MDC IDC MSMT LEADCHNL RA IMPEDANCE VALUE: 409 Ohm
MDC IDC MSMT LEADCHNL RA PACING THRESHOLD AMPLITUDE: 0.7 V
MDC IDC SESS DTM: 20150803101412
MDC IDC SET LEADCHNL RV PACING AMPLITUDE: 1.8 V
MDC IDC SET LEADCHNL RV PACING PULSEWIDTH: 0.4 ms
MDC IDC STAT BRADY RV PERCENT PACED: 0 %

## 2014-07-02 NOTE — Progress Notes (Signed)
Pacemaker check in clinic. Normal device function. Thresholds, sensing, impedances consistent with previous measurements. Device programmed to maximize longevity. No mode switch or high ventricular rates noted. Device programmed at appropriate safety margins--turned on atrial autocapture management. Histogram distribution appropriate for patient activity level. Device programmed to optimize intrinsic conduction. Estimated longevity  7 years 4 months. ROV in 6 months w/GT.

## 2014-07-04 ENCOUNTER — Telehealth: Payer: Self-pay | Admitting: *Deleted

## 2014-07-04 NOTE — Telephone Encounter (Signed)
I will be happy to sign a prescription for Cialis for whatever amount works best for patient

## 2014-07-04 NOTE — Telephone Encounter (Signed)
Pt states that Dr Leanne Chang gave him cialis 5 mg once daily.  Rx is over $200 for 30 pills.  Offered him a savings card for 30 pills free for a month, but he has already used that.  Dr Leanne Chang is out of [place] so I can't reach him by phone.  Pt is wanting to know if there was a way to write a rx for more tablets in a month so he can get a bigger supply or is something else he can use.  Can you help with me this?

## 2014-07-05 MED ORDER — TADALAFIL 5 MG PO TABS: ORAL_TABLET | ORAL | Status: DC

## 2014-07-05 MED ORDER — TADALAFIL 5 MG PO TABS
ORAL_TABLET | ORAL | Status: DC
Start: 2014-07-05 — End: 2014-07-05

## 2014-07-05 NOTE — Telephone Encounter (Signed)
rx sent in electronically, pt aware 

## 2014-07-06 ENCOUNTER — Telehealth: Payer: Self-pay | Admitting: Internal Medicine

## 2014-07-06 MED ORDER — TADALAFIL 5 MG PO TABS: 5.0000 mg | ORAL_TABLET | Freq: Every day | ORAL | Status: DC

## 2014-07-06 NOTE — Telephone Encounter (Signed)
PA for Cialis 5 mg was denied. Pt must try and fail tamsulosin, finasteride and alfuzosin.

## 2014-07-06 NOTE — Telephone Encounter (Signed)
BPH is dx

## 2014-07-06 NOTE — Telephone Encounter (Signed)
Called in a 30 day supply to [facility] per pt request.  He still wants to know what Dr Leanne Chang wants to do.  Told pt Dr Leanne Chang not here till next week.  He will wait for his return

## 2014-07-06 NOTE — Telephone Encounter (Signed)
I received a PA request for Cialis 5 mg, what's the pt's diagnosis?? Most insurances doesn't usually allow 5 mg for erectile dysfunction or that quantity.  It's usually 4-6 per month of Cialis 20 mg.  I don't see that the pt has BPH.  Please advise.

## 2014-07-12 ENCOUNTER — Other Ambulatory Visit: Payer: Self-pay | Admitting: Internal Medicine

## 2014-07-12 ENCOUNTER — Encounter: Payer: Self-pay | Admitting: Internal Medicine

## 2014-08-14 ENCOUNTER — Encounter: Payer: Self-pay | Admitting: Family Medicine

## 2014-08-14 ENCOUNTER — Telehealth: Payer: Self-pay | Admitting: Internal Medicine

## 2014-08-14 ENCOUNTER — Ambulatory Visit (INDEPENDENT_AMBULATORY_CARE_PROVIDER_SITE_OTHER): Payer: Medicare PPO | Admitting: Family Medicine

## 2014-08-14 VITALS — BP 120/80 | Temp 98.3°F | Wt 166.0 lb

## 2014-08-14 DIAGNOSIS — N401 Enlarged prostate with lower urinary tract symptoms: Secondary | ICD-10-CM

## 2014-08-14 DIAGNOSIS — R351 Nocturia: Secondary | ICD-10-CM

## 2014-08-14 DIAGNOSIS — R972 Elevated prostate specific antigen [PSA]: Secondary | ICD-10-CM

## 2014-08-14 DIAGNOSIS — R300 Dysuria: Secondary | ICD-10-CM

## 2014-08-14 HISTORY — DX: Benign prostatic hyperplasia with lower urinary tract symptoms: N40.1

## 2014-08-14 LAB — POCT URINALYSIS DIPSTICK
Bilirubin, UA: NEGATIVE
Blood, UA: NEGATIVE
Glucose, UA: NEGATIVE
Ketones, UA: NEGATIVE
LEUKOCYTES UA: NEGATIVE
Nitrite, UA: NEGATIVE
Protein, UA: NEGATIVE
Spec Grav, UA: 1.01
Urobilinogen, UA: 0.2
pH, UA: 6

## 2014-08-14 LAB — BASIC METABOLIC PANEL
BUN: 16 mg/dL (ref 6–23)
CHLORIDE: 101 meq/L (ref 96–112)
CO2: 30 meq/L (ref 19–32)
Calcium: 9.6 mg/dL (ref 8.4–10.5)
Creatinine, Ser: 1.2 mg/dL (ref 0.4–1.5)
GFR: 64.03 mL/min (ref >60.00)
Glucose, Bld: 125 mg/dL — ABNORMAL HIGH (ref 70–99)
Potassium: 4.1 mEq/L (ref 3.5–5.1)
Sodium: 138 mEq/L (ref 135–145)

## 2014-08-14 LAB — PSA: PSA: 14.94 ng/mL — AB (ref 0.10–4.00)

## 2014-08-14 MED ORDER — TAMSULOSIN HCL 0.4 MG PO CAPS: 0.4000 mg | ORAL_CAPSULE | Freq: Every day | ORAL | Status: DC

## 2014-08-14 NOTE — Patient Instructions (Signed)
No caffeine  Flomax...........Marland Kitchen 1 now......... then one every morning  BP check daily  Call the urology Center and make an appointment to see Dr. Dutch Gray.Marland Kitchen

## 2014-08-14 NOTE — Progress Notes (Signed)
   Subjective:    Patient ID: [name], male    DOB: 10/10/1946, 68 y.o.   MRN: [id number]  HPI [name] is a 68 year old male married nonsmoker who comes in today for evaluation of urinary frequency  Historically his symptoms really date back to about 5 years ago. He knows and he had trouble dribbling. 3 years ago he had a surgical procedure and had to be catheterized postop. He was put on Flomax today for a month and stopped it. He seemed to be doing well until couple weeks ago when he had some discomfort in his left groin. That went away and now he's had a pressure sensation and can't urinate. Last night he had nocturia x8. No fever chills.   Review of Systems    review of systems otherwise negative Objective:   Physical Exam Well-developed and nourished male no acute distress vital signs stable he is afebrile examination the abdomen shows no distended bladder  Urinalysis normal       Assessment & Plan:  BPH.......Marland Kitchen restart Flomax....... urologic consult with Dr. Alinda Money,,,, no caffeine.Marland Kitchen

## 2014-08-14 NOTE — Telephone Encounter (Signed)
Pt instructed to cb if he could not get a quick appt w/ dr Alinda Money, [facility]. Pt states he was told nov. pls advise for stat appt.

## 2014-08-14 NOTE — Progress Notes (Signed)
Pre visit review using our clinic review tool, if applicable. No additional management support is needed unless otherwise documented below in the visit note. 

## 2014-08-15 NOTE — Telephone Encounter (Signed)
Left message on machine for patient to return our call 

## 2014-08-16 NOTE — Telephone Encounter (Signed)
Patient is aware 

## 2014-10-11 ENCOUNTER — Encounter: Payer: Self-pay | Admitting: Family Medicine

## 2014-10-11 ENCOUNTER — Ambulatory Visit (INDEPENDENT_AMBULATORY_CARE_PROVIDER_SITE_OTHER): Payer: Medicare PPO | Admitting: Family Medicine

## 2014-10-11 VITALS — BP 117/78 | HR 84 | Temp 98.2°F | Ht 68.0 in | Wt 162.0 lb

## 2014-10-11 DIAGNOSIS — J0100 Acute maxillary sinusitis, unspecified: Secondary | ICD-10-CM

## 2014-10-11 DIAGNOSIS — J309 Allergic rhinitis, unspecified: Secondary | ICD-10-CM | POA: Insufficient documentation

## 2014-10-11 DIAGNOSIS — J302 Other seasonal allergic rhinitis: Secondary | ICD-10-CM

## 2014-10-11 DIAGNOSIS — N400 Enlarged prostate without lower urinary tract symptoms: Secondary | ICD-10-CM | POA: Insufficient documentation

## 2014-10-11 MED ORDER — AZITHROMYCIN 250 MG PO TABS: ORAL_TABLET | ORAL | Status: DC

## 2014-10-11 NOTE — Progress Notes (Signed)
Pre visit review using our clinic review tool, if applicable. No additional management support is needed unless otherwise documented below in the visit note. 

## 2014-10-11 NOTE — Progress Notes (Signed)
   Subjective:    Patient ID: [name], male    DOB: 09/07/1946, 68 y.o.   MRN: [id number]  HPI Here for several things. First he has chronic allergies which cause itchy eyes, sinus pressure and a stuffy nose. He has been using Afrin nasal spray twice daily every day for the past 2 months. He has also been using Benadryl and Tylenol with sudafed. These help a bit but it doesn't last long. Also for the past 5 days he has been blowing green mucus from the nose and has a ST. No cough.    Review of Systems  Constitutional: Negative.   HENT: Positive for congestion, postnasal drip, sinus pressure and sore throat.   Eyes: Negative.   Respiratory: Negative.        Objective:   Physical Exam  Constitutional: He appears well-developed and well-nourished.  HENT:  Right Ear: External ear normal.  Left Ear: External ear normal.  Nose: Nose normal.  Mouth/Throat: Oropharynx is clear and moist.  Eyes: Conjunctivae are normal.  Pulmonary/Chest: Effort normal and breath sounds normal.  Lymphadenopathy:    He has no cervical adenopathy.          Assessment & Plan:  Treat the acute sinusitis with a Zpack. He does have allergies but he has developed rhinitis medicamentosa from the Afrin. He will stop this and switch to Flonase sprays daily. Switch from Benadryl to Zyrtec daily.

## 2014-12-05 ENCOUNTER — Encounter: Payer: Self-pay | Admitting: Internal Medicine

## 2014-12-05 ENCOUNTER — Telehealth: Payer: Self-pay

## 2014-12-05 DIAGNOSIS — G4700 Insomnia, unspecified: Secondary | ICD-10-CM

## 2014-12-05 NOTE — Telephone Encounter (Signed)
Pt called requesting a 90 day refill of Ambien 5 mg until est appt on 3.2.2016.  Pls advise if rx can be sent to [facility].  Pls advise.

## 2014-12-05 NOTE — Telephone Encounter (Signed)
Ok to fill 

## 2014-12-06 MED ORDER — ZOLPIDEM TARTRATE 5 MG PO TABS: 5.0000 mg | ORAL_TABLET | Freq: Every evening | ORAL | Status: DC | PRN

## 2014-12-06 NOTE — Telephone Encounter (Signed)
Medication called into [facility]

## 2014-12-13 DIAGNOSIS — L570 Actinic keratosis: Secondary | ICD-10-CM | POA: Diagnosis not present

## 2014-12-27 ENCOUNTER — Ambulatory Visit (INDEPENDENT_AMBULATORY_CARE_PROVIDER_SITE_OTHER): Payer: Medicare PPO

## 2014-12-27 DIAGNOSIS — Z23 Encounter for immunization: Secondary | ICD-10-CM

## 2015-01-01 ENCOUNTER — Encounter: Payer: Self-pay | Admitting: Internal Medicine

## 2015-01-01 ENCOUNTER — Ambulatory Visit (INDEPENDENT_AMBULATORY_CARE_PROVIDER_SITE_OTHER): Payer: Medicare PPO | Admitting: Internal Medicine

## 2015-01-01 VITALS — BP 124/70 | HR 61 | Ht 68.0 in | Wt 169.0 lb

## 2015-01-01 DIAGNOSIS — I4891 Unspecified atrial fibrillation: Secondary | ICD-10-CM | POA: Diagnosis not present

## 2015-01-01 DIAGNOSIS — I10 Essential (primary) hypertension: Secondary | ICD-10-CM | POA: Diagnosis not present

## 2015-01-01 DIAGNOSIS — Z950 Presence of cardiac pacemaker: Secondary | ICD-10-CM

## 2015-01-01 DIAGNOSIS — I4729 Other ventricular tachycardia: Secondary | ICD-10-CM

## 2015-01-01 DIAGNOSIS — I472 Ventricular tachycardia: Secondary | ICD-10-CM

## 2015-01-01 DIAGNOSIS — I4892 Unspecified atrial flutter: Secondary | ICD-10-CM

## 2015-01-01 DIAGNOSIS — I495 Sick sinus syndrome: Secondary | ICD-10-CM

## 2015-01-01 HISTORY — DX: Unspecified atrial fibrillation: I48.91

## 2015-01-01 HISTORY — DX: Unspecified atrial flutter: I48.92

## 2015-01-01 LAB — MDC_IDC_ENUM_SESS_TYPE_INCLINIC
Brady Statistic RA Percent Paced: 92 %
Date Time Interrogation Session: 20160202135450
Implantable Pulse Generator Serial Number: 66063261
Lead Channel Impedance Value: 409 Ohm
Lead Channel Pacing Threshold Amplitude: 0.7 V
Lead Channel Pacing Threshold Amplitude: 1.8 V
Lead Channel Pacing Threshold Amplitude: 1.8 V
Lead Channel Pacing Threshold Pulse Width: 0.4 ms
Lead Channel Pacing Threshold Pulse Width: 0.4 ms
Lead Channel Setting Pacing Pulse Width: 0.4 ms
MDC IDC MSMT LEADCHNL RA PACING THRESHOLD AMPLITUDE: 0.6 V
MDC IDC MSMT LEADCHNL RA PACING THRESHOLD AMPLITUDE: 0.7 V
MDC IDC MSMT LEADCHNL RA PACING THRESHOLD PULSEWIDTH: 0.4 ms
MDC IDC MSMT LEADCHNL RA PACING THRESHOLD PULSEWIDTH: 0.4 ms
MDC IDC MSMT LEADCHNL RV IMPEDANCE VALUE: 429 Ohm
MDC IDC MSMT LEADCHNL RV PACING THRESHOLD PULSEWIDTH: 0.4 ms
MDC IDC MSMT LEADCHNL RV SENSING INTR AMPL: 9.9 mV
MDC IDC SET LEADCHNL RA PACING AMPLITUDE: 2.9 V
MDC IDC SET LEADCHNL RV PACING AMPLITUDE: 2.1 V
MDC IDC STAT BRADY RV PERCENT PACED: 2 %

## 2015-01-01 MED ORDER — RIVAROXABAN 20 MG PO TABS: 20.0000 mg | ORAL_TABLET | Freq: Every day | ORAL | Status: DC

## 2015-01-01 NOTE — Assessment & Plan Note (Signed)
Interogation of his PPM demonstrates that he is having paroxysms of atrial fib and flutter with an RVR. I have discussed the treatment options. He is at risk for stroke. We discussed the pro's and con's of the different anti-coagulants and he wishes to start Xarelto.  He is minimally symptomatic at this point so I will not recommend and anti-arrhythmic meds.

## 2015-01-01 NOTE — Assessment & Plan Note (Signed)
Device interogation demonstrated minimal PVC's or NSVT.

## 2015-01-01 NOTE — Assessment & Plan Note (Signed)
His blood pressure is well controlled on medical therapy. He will continue.

## 2015-01-01 NOTE — Assessment & Plan Note (Signed)
His Biotronik DDD PM is working normally. Will recheck in several months.  

## 2015-01-01 NOTE — Patient Instructions (Signed)
Your physician wants you to follow-up in: 6 months with Dr Lovena Le. You will receive a reminder letter in the mail two months in advance. If you don't receive a letter, please call our office to schedule the follow-up appointment.  Your physician has recommended you make the following change in your medication:  1) Aspirin 2) Start Xarelto 20 mg  Eat less salt

## 2015-01-01 NOTE — Progress Notes (Signed)
HPI [name] returns today for followup. He is a very pleasant middle-age male sinus node dysfunction, status post permanent pacemaker insertion. He also is a history of hypertension which has been well-controlled. He denies chest pain. He does experience palpitations, and noted his heart rate is at times fast. He is concerned about exertional dyspnea. The patient was treated with a closed loop stimulation pacemaker. He remains frustrated about his inability to swim faster. The patient is a Cytogeneticist. He denies peripheral edema. No syncope. He has occasional palpitations. No Known Allergies   Current Outpatient Prescriptions  Medication Sig Dispense Refill  . benazepril (LOTENSIN) 5 MG tablet TAKE ONE TABLET BY MOUTH ONCE DAILY 90 tablet 1  . CRESTOR 10 MG tablet TAKE ONE-HALF TABLET BY MOUTH ONCE DAILY 45 tablet 3  . finasteride (PROSCAR) 5 MG tablet Take 1 tablet by mouth daily.    . fish oil-omega-3 fatty acids 1000 MG capsule Take 2 g by mouth daily.      . indomethacin (INDOCIN) 25 MG capsule Take 25 mg by mouth 2 (two) times daily as needed.    . Multiple Vitamin (MULTIVITAMIN) tablet Take 1 tablet by mouth daily.      . naproxen (NAPROSYN) 500 MG tablet Take 500 mg by mouth daily as needed (pain).     Marland Kitchen omeprazole (PRILOSEC) 20 MG capsule Take 20 mg by mouth daily as needed (heartburn).     . tadalafil (CIALIS) 5 MG tablet Take 1 tablet (5 mg total) by mouth daily. 30 tablet 0  . tamsulosin (FLOMAX) 0.4 MG CAPS capsule Take 1 capsule (0.4 mg total) by mouth daily. 100 capsule 3  . zolpidem (AMBIEN) 5 MG tablet Take 1 tablet (5 mg total) by mouth at bedtime as needed. (Patient taking differently: Take 5 mg by mouth at bedtime as needed for sleep. ) 90 tablet 3  . rivaroxaban (XARELTO) 20 MG TABS tablet Take 1 tablet (20 mg total) by mouth daily with supper. 30 tablet 0  . rivaroxaban (XARELTO) 20 MG TABS tablet Take 1 tablet (20 mg total) by mouth daily with supper. 30  tablet 11   No current facility-administered medications for this visit.     Past Medical History  Diagnosis Date  . HTN (hypertension)   . CVA (cerebral vascular accident)   . Ischemic optic neuropathy   . Other and unspecified hyperlipidemia   . Bradycardia   . Abscess     right posterior neck  . Spinal stenosis   . GERD (gastroesophageal reflux disease)   . External hemorrhoids   . Gout   . Elevated LFTs   . Sinoatrial node dysfunction     ROS:   All systems reviewed and negative except as noted in the HPI.   Past Surgical History  Procedure Laterality Date  . Cardiac catheterization  1996  . Cholecystectomy  02/07/2003  . Colonoscopy  06/23/2004  . Pacemaker insertion    . Eye surgery      left eye  . Abscess drainage      righ tposterior neck  . Left ingunial hernia  06/08/2011     Family History  Problem Relation Age of Onset  . Lung cancer Mother   . Heart attack Mother   . Hypertension Mother   . Heart attack Father   . Heart disease Father   . Diabetes Father   . Diabetes Paternal Grandfather      History   Social History  . Marital Status:  Married    Spouse Name: N/A    Number of Children: N/A  . Years of Education: N/A   Occupational History  . Not on file.   Social History Main Topics  . Smoking status: Former Smoker    Quit date: 11/04/1991  . Smokeless tobacco: Never Used  . Alcohol Use: 0.0 oz/week    0 Not specified per week     Comment: occ  . Drug Use: No  . Sexual Activity: Not on file   Other Topics Concern  . Not on file   Social History Narrative     BP 124/70 mmHg  Pulse 61  Ht 5\' 8"  (1.727 m)  Wt 169 lb (76.658 kg)  BMI 25.70 kg/m2  Physical Exam:  Well appearing middle-aged man,NAD HEENT: Unremarkable Neck:  No JVD, no thyromegally Lungs:  Clear with no wheezes, rales, or rhonchi. HEART:  Regular rate rhythm, no murmurs, no rubs, no clicks Abd:  soft, positive bowel sounds, no organomegally, no  rebound, no guarding Ext:  2 plus pulses, no edema, no cyanosis, no clubbing Skin:  No rashes no nodules Neuro:  CN II through XII intact, motor grossly intact   DEVICE  Normal device function.  See PaceArt for details.   Assess/Plan:

## 2015-01-02 ENCOUNTER — Encounter: Payer: Self-pay | Admitting: Internal Medicine

## 2015-01-07 ENCOUNTER — Other Ambulatory Visit: Payer: Self-pay | Admitting: Internal Medicine

## 2015-01-30 ENCOUNTER — Ambulatory Visit (INDEPENDENT_AMBULATORY_CARE_PROVIDER_SITE_OTHER): Payer: Medicare PPO | Admitting: Family Medicine

## 2015-01-30 ENCOUNTER — Encounter: Payer: Self-pay | Admitting: Family Medicine

## 2015-01-30 VITALS — BP 140/88 | HR 67 | Temp 97.9°F | Ht 68.0 in | Wt 164.0 lb

## 2015-01-30 DIAGNOSIS — Z23 Encounter for immunization: Secondary | ICD-10-CM | POA: Diagnosis not present

## 2015-01-30 DIAGNOSIS — N529 Male erectile dysfunction, unspecified: Secondary | ICD-10-CM

## 2015-01-30 DIAGNOSIS — Z87891 Personal history of nicotine dependence: Secondary | ICD-10-CM | POA: Insufficient documentation

## 2015-01-30 DIAGNOSIS — G4700 Insomnia, unspecified: Secondary | ICD-10-CM | POA: Insufficient documentation

## 2015-01-30 DIAGNOSIS — R972 Elevated prostate specific antigen [PSA]: Secondary | ICD-10-CM

## 2015-01-30 DIAGNOSIS — N401 Enlarged prostate with lower urinary tract symptoms: Secondary | ICD-10-CM | POA: Diagnosis not present

## 2015-01-30 DIAGNOSIS — E785 Hyperlipidemia, unspecified: Secondary | ICD-10-CM

## 2015-01-30 DIAGNOSIS — I10 Essential (primary) hypertension: Secondary | ICD-10-CM | POA: Diagnosis not present

## 2015-01-30 DIAGNOSIS — I639 Cerebral infarction, unspecified: Secondary | ICD-10-CM | POA: Insufficient documentation

## 2015-01-30 DIAGNOSIS — R351 Nocturia: Secondary | ICD-10-CM

## 2015-01-30 HISTORY — DX: Personal history of nicotine dependence: Z87.891

## 2015-01-30 HISTORY — DX: Male erectile dysfunction, unspecified: N52.9

## 2015-01-30 HISTORY — DX: Insomnia, unspecified: G47.00

## 2015-01-30 LAB — COMPREHENSIVE METABOLIC PANEL
ALBUMIN: 4.3 g/dL (ref 3.5–5.2)
ALT: 16 U/L (ref 0–53)
AST: 16 U/L (ref 0–37)
Alkaline Phosphatase: 70 U/L (ref 39–117)
BUN: 18 mg/dL (ref 6–23)
CO2: 29 meq/L (ref 19–32)
Calcium: 9.9 mg/dL (ref 8.4–10.5)
Chloride: 102 mEq/L (ref 96–112)
Creatinine, Ser: 1.25 mg/dL (ref 0.40–1.50)
GFR: 61 mL/min (ref >60.00)
GLUCOSE: 107 mg/dL — AB (ref 70–99)
Potassium: 4.5 mEq/L (ref 3.5–5.1)
SODIUM: 138 meq/L (ref 135–145)
TOTAL PROTEIN: 6.9 g/dL (ref 6.0–8.3)
Total Bilirubin: 1 mg/dL (ref 0.2–1.2)

## 2015-01-30 LAB — CBC
HEMATOCRIT: 44.3 % (ref 39.0–52.0)
HEMOGLOBIN: 14.9 g/dL (ref 13.0–17.0)
MCHC: 33.6 g/dL (ref 30.0–36.0)
MCV: 87.3 fl (ref 78.0–100.0)
Platelets: 267 10*3/uL (ref 150.0–400.0)
RBC: 5.08 Mil/uL (ref 4.22–5.81)
RDW: 13.9 % (ref 11.5–15.5)
WBC: 7.2 10*3/uL (ref 4.0–10.5)

## 2015-01-30 LAB — LIPID PANEL
Cholesterol: 173 mg/dL (ref 0–200)
HDL: 39 mg/dL — ABNORMAL LOW (ref >39.00)
NONHDL: 134
TRIGLYCERIDES: 294 mg/dL — AB (ref 0.0–149.0)
Total CHOL/HDL Ratio: 4
VLDL: 58.8 mg/dL — ABNORMAL HIGH (ref 0.0–40.0)

## 2015-01-30 LAB — TSH: TSH: 2.58 u[IU]/mL (ref 0.35–4.50)

## 2015-01-30 LAB — LDL CHOLESTEROL, DIRECT: Direct LDL: 88 mg/dL

## 2015-01-30 LAB — PSA: PSA: 1.16 ng/mL (ref 0.10–4.00)

## 2015-01-30 MED ORDER — TADALAFIL 5 MG PO TABS: 5.0000 mg | ORAL_TABLET | Freq: Every day | ORAL | Status: DC

## 2015-01-30 NOTE — Progress Notes (Signed)
Pre visit review using our clinic review tool, if applicable. No additional management support is needed unless otherwise documented below in the visit note. 

## 2015-01-30 NOTE — Assessment & Plan Note (Signed)
Very mild poor control but excellent control on last 2 visits so we decided not to make a chang etoday and continue to monitor.

## 2015-01-30 NOTE — Assessment & Plan Note (Signed)
Update lipids today. Previously LDL <100 but Trig 200-500. Would not start another agent unless trig >500 and even then may monitor one more time before additional medciation.

## 2015-01-30 NOTE — Progress Notes (Signed)
Nathan Reddish, MD Phone: [phone]  Subjective:  Patient presents today to establish care with me as their new primary care provider. Patient was formerly a patient of Dr. Leanne Chang. Chief complaint-noted.   Hyperlipidemia-good control of LDL, suboptimal control triglycerides previously  Lab Results  Component Value Date   LDLCALC 60 04/17/2014   On statin: crestor 5mg  Regular exercise: Swim 4 days a week ROS- no chest pain or shortness of breath. No myalgias  Elevated PSA- improved BPH with nocturia- stable Known BPH. Patient has bene on flomax for sometime. Last saw Dr. Leanne Chang last year and PSA increased to 14 and was sent to urology. At their office level of PSA back down to 2.6 (visible through care everywhere). He was started on finasteride for BPH. He does not wish to return to that office for distance and did not "click" with provider ROS- nocutria 0-3x a night, weak stream at times  Hypertension-very mild elevation  BP Readings from Last 3 Encounters:  01/30/15 140/88  01/01/15 124/70  10/11/14 117/78  Home BP monitoring-no Compliant with medications-yes without side effects ROS-Denies any CP, HA, SOB. Palpitations at times with swimming (cards aware)  The following were reviewed and entered/updated in epic: Past Medical History  Diagnosis Date  . HTN (hypertension)   . CVA (cerebral vascular accident)   . Ischemic optic neuropathy   . Other and unspecified hyperlipidemia   . Bradycardia   . Abscess     right posterior neck  . Spinal stenosis   . GERD (gastroesophageal reflux disease)   . External hemorrhoids   . Elevated LFTs   . Sinoatrial node dysfunction    Patient Active Problem List   Diagnosis Date Noted  . Atrial fibrillation and flutter 01/01/2015    Priority: High  . Ventricular tachycardia, non-sustained 01/25/2013    Priority: High  . Cardiac pacemaker in situ 12/28/2009    Priority: High  . CVA (cerebral infarction) 01/30/2015    Priority:  Medium  . BPH associated with nocturia 08/14/2014    Priority: Medium  . Hyperlipidemia 07/12/2007    Priority: Medium  . NEUROPATHY, ISCHEMIC OPTIC 06/02/2007    Priority: Medium  . Essential hypertension 06/02/2007    Priority: Medium  . Insomnia 01/30/2015    Priority: Low  . Erectile dysfunction 01/30/2015    Priority: Low  . Former smoker 01/30/2015    Priority: Low  . Allergic rhinitis 10/11/2014    Priority: Low  . Sinoatrial node dysfunction     Priority: Low  . SPINAL STENOSIS, LUMBAR 08/22/2010    Priority: Low  . GERD 03/17/2010    Priority: Low   Past Surgical History  Procedure Laterality Date  . Cardiac catheterization  1996  . Cholecystectomy  02/07/2003  . Colonoscopy  06/23/2004  . Pacemaker insertion    . Eye surgery      left eye  . Abscess drainage      righ tposterior neck  . Left ingunial hernia  06/08/2011  . Right hip replacement      2012 Dr. Maureen Ralphs    Family History  Problem Relation Age of Onset  . Lung cancer Mother     mets to brain  . Heart attack Mother     74  . Hypertension Mother   . Heart attack Father     mid 1s, smoker. died 58.   . Diabetes Father   . Diabetes Paternal Grandfather     Medications- reviewed and updated Current Outpatient Prescriptions  Medication Sig Dispense Refill  . benazepril (LOTENSIN) 5 MG tablet TAKE ONE TABLET BY MOUTH ONCE DAILY 90 tablet 0  . CRESTOR 10 MG tablet TAKE ONE-HALF TABLET BY MOUTH ONCE DAILY 45 tablet 3  . finasteride (PROSCAR) 5 MG tablet Take 1 tablet by mouth daily.    . indomethacin (INDOCIN) 25 MG capsule Take 25 mg by mouth 2 (two) times daily as needed.    . Multiple Vitamin (MULTIVITAMIN) tablet Take 1 tablet by mouth daily.      . naproxen (NAPROSYN) 500 MG tablet Take 500 mg by mouth daily as needed (pain).     Marland Kitchen omeprazole (PRILOSEC) 20 MG capsule TAKE ONE CAPSULE BY MOUTH DAILY. 90 capsule 0  . rivaroxaban (XARELTO) 20 MG TABS tablet Take 1 tablet (20 mg total) by  mouth daily with supper. 30 tablet 11  . tadalafil (CIALIS) 5 MG tablet Take 1 tablet (5 mg total) by mouth daily. 30 tablet 0  . tamsulosin (FLOMAX) 0.4 MG CAPS capsule Take 1 capsule (0.4 mg total) by mouth daily. 100 capsule 3  . zolpidem (AMBIEN) 5 MG tablet Take 1 tablet (5 mg total) by mouth at bedtime as needed. (Patient taking differently: Take 5 mg by mouth at bedtime as needed for sleep. ) 90 tablet 3  . fish oil-omega-3 fatty acids 1000 MG capsule Take 2 g by mouth daily.       Allergies-reviewed and updated No Known Allergies  History   Social History  . Marital Status: Married    Spouse Name: N/A  . Number of Children: N/A  . Years of Education: N/A   Social History Main Topics  . Smoking status: Former Smoker -- 1.00 packs/day for 20 years    Types: Cigarettes    Quit date: 11/04/1991  . Smokeless tobacco: Never Used  . Alcohol Use: 0.0 oz/week    0 Standard drinks or equivalent per week     Comment: occ  . Drug Use: No  . Sexual Activity: Not on file   Other Topics Concern  . None   Social History Narrative   Married. 1 child and 1 step child. 2 grandchildren.       Lawyer (part time)      Hobbies: swimming (states not fun), travel    ROS--See HPI   Objective: BP 140/88 mmHg  Pulse 67  Temp(Src) 97.9 F (36.6 C) (Oral)  Ht 5\' 8"  (1.727 m)  Wt 164 lb (74.39 kg)  BMI 24.94 kg/m2  SpO2 96% Gen: NAD, resting comfortably HEENT: Mucous membranes are moist. Oropharynx normal CV: RRR no murmurs rubs or gallops Lungs: CTAB no crackles, wheeze, rhonchi Abdomen: soft/nontender/nondistended/normal bowel sounds. No rebound or guarding.  Ext: no edema Skin: warm, dry, no rash, balding Neuro: grossly normal, moves all extremities, PERRLA   Assessment/Plan:  Essential hypertension Very mild poor control but excellent control on last 2 visits so we decided not to make a chang etoday and continue to monitor.    BPH associated with nocturia Elevated  PSA Continue Finasteride, flomax. Check PSA today, hopeful for 50% reduction. Plan would be to send to [facility] if PSA stable or increased   Hyperlipidemia Update lipids today. Previously LDL <100 but Trig 200-500. Would not start another agent unless trig >500 and even then may monitor one more time before additional medciation.   6 month planned unless abnormal labs.   Orders Placed This Encounter  Procedures  . Pneumococcal conjugate vaccine 13-valent IM  . PSA  .  Lipid panel    [place]    Order Specific Question:  Has the patient fasted?    Answer:  No  . Comprehensive metabolic panel    [place]    Order Specific Question:  Has the patient fasted?    Answer:  No  . CBC    [place]  . TSH    [place]    Meds ordered this encounter  Medications  . tadalafil (CIALIS) 5 MG tablet    Sig: Take 1 tablet (5 mg total) by mouth daily.    Dispense:  30 tablet    Refill:  5

## 2015-01-30 NOTE — Patient Instructions (Addendum)
Update fasting labs today including cholesterol and PSA  Keep up the swimming!   See you in 6 months  BP up a hair but looked fine other visits recently-no changes planned  Would recommend final pneumonia vaccine Prevnar 13  Health Maintenance Due  Topic Date Due  . ZOSTAVAX -check with insurance 10/18/2006

## 2015-01-30 NOTE — Assessment & Plan Note (Signed)
Continue Finasteride, flomax. Check PSA today, hopeful for 50% reduction. Plan would be to send to [facility] urology if PSA stable or increased

## 2015-01-31 ENCOUNTER — Other Ambulatory Visit: Payer: Self-pay | Admitting: *Deleted

## 2015-01-31 MED ORDER — ROSUVASTATIN CALCIUM 5 MG PO TABS: 5.0000 mg | ORAL_TABLET | Freq: Every day | ORAL | Status: DC

## 2015-02-12 DIAGNOSIS — L570 Actinic keratosis: Secondary | ICD-10-CM | POA: Diagnosis not present

## 2015-03-18 ENCOUNTER — Encounter: Payer: Self-pay | Admitting: Internal Medicine

## 2015-03-18 ENCOUNTER — Ambulatory Visit (INDEPENDENT_AMBULATORY_CARE_PROVIDER_SITE_OTHER): Payer: Medicare PPO | Admitting: Family Medicine

## 2015-03-18 ENCOUNTER — Encounter: Payer: Self-pay | Admitting: Family Medicine

## 2015-03-18 ENCOUNTER — Telehealth: Payer: Self-pay | Admitting: Family Medicine

## 2015-03-18 VITALS — BP 130/62 | HR 76 | Temp 98.2°F | Wt 168.0 lb

## 2015-03-18 DIAGNOSIS — R079 Chest pain, unspecified: Secondary | ICD-10-CM

## 2015-03-18 DIAGNOSIS — R0789 Other chest pain: Secondary | ICD-10-CM | POA: Diagnosis not present

## 2015-03-18 LAB — TROPONIN I: TNIDX: 0 ug/l (ref 0.00–0.06)

## 2015-03-18 NOTE — Telephone Encounter (Signed)
Per Dr. Yong Channel have pt arrive today at 12:50 for an EKG.  Pt states he will be here.

## 2015-03-18 NOTE — Telephone Encounter (Signed)
Information given to Dr. Yong Channel and Bevelyn Ngo for review.

## 2015-03-18 NOTE — Telephone Encounter (Signed)
PLEASE NOTE: All timestamps contained within this report are represented as [place] Standard Time. CONFIDENTIALTY NOTICE: This fax transmission is intended only for the addressee. It contains information that is legally privileged, confidential or otherwise protected from use or disclosure. If you are not the intended recipient, you are strictly prohibited from reviewing, disclosing, copying using or disseminating any of this information or taking any action in reliance on or regarding this information. If you have received this fax in error, please notify us immediately by telephone so that we can arrange for its return to [place]. Phone: [phone], Toll-Free: [phone], Fax: [phone] Page: 1 of 1 Call Id: [id number] [facility] - Client [place] Patient Name: [name] DOB: Jun 07, 1946 Initial Comment Caller states, wants an appt, has chest discomfort for a week , pressure / tightness Nurse Assessment Nurse: Marcelline Deist, RN, Lynda Date/Time (Eastern Time): 03/18/2015 10:25:57 AM Confirm and document reason for call. If symptomatic, describe symptoms. ---Caller states he wants an appt, has had chest discomfort for a week , pressure / tightness. Has had belching. SOB. No fever. Has the patient traveled out of the country within the last 30 days? ---Not Applicable Does the patient require triage? ---Yes Related visit to physician within the last 2 weeks? ---No Does the PT have any chronic conditions? (i.e. diabetes, asthma, etc.) ---Yes List chronic conditions. ---Afib, on Xarelto, pacemaker Guidelines Guideline Title Affirmed Question Affirmed Notes Chest Pain [1] Chest pain lasts > 5 minutes AND [2] described as crushing, pressure-like, or heavy Final Disposition User Call [facility] [place], RN, Kermit Balo Comments Caller states his chest pain goes from nipple to nipple. It is more constant than not. Nurse  will call backline to report that pt. wants an appt., not going to call 911/EMS. Spoke with backline & reported that caller wants an appt. rather than calling EMS/911.

## 2015-03-18 NOTE — Patient Instructions (Addendum)
We are going to get a blood test to rule out a heart attack. You are going to see cardiology tomorrow afternoon. I do not think this is cardiac but you have several risk factors (blood pressure, cholesterol, history of stroke).   We can follow up after your evaluation by cardiology which may include stress testing. If negative, I may consider along with cardiology's aid a trial of reduced dosing statin.

## 2015-03-18 NOTE — Progress Notes (Signed)
Garret Reddish, MD Phone: [phone]  Subjective:   [name] is a 69 y.o. year old very pleasant male patient who presents with the following:  Chest pain-new Central chest pain constant dull aching 1-2/10, hurts with sneezing or coughing. Almost feels mildly warm. No injuries. Going on for at least a week. Swam 3-4 nights last week and pain did not worsen with that activity. Mowing the lawn did not make the pain worse. Not positional with lying down or sitting up. Tried mylanta for a day and has not helped. Cannot reproduce pain with pushing on chest.   Before chest pain started for 2-3 months has had a lot of muscle aches in back or pecs bilaterally or arms. Has had similar pain in the past. Took a week off working out and didn't improve.   ROS- a lot of belching, no shortness of breath/nausea/left arm or neck pain/diaphoresis with chest pain.   Past Medical History- Patient Active Problem List   Diagnosis Date Noted  . Atrial fibrillation and flutter 01/01/2015    Priority: High  . Ventricular tachycardia, non-sustained 01/25/2013    Priority: High  . Cardiac pacemaker in situ 12/28/2009    Priority: High  . CVA (cerebral infarction) 01/30/2015    Priority: Medium  . BPH associated with nocturia 08/14/2014    Priority: Medium  . Hyperlipidemia 07/12/2007    Priority: Medium  . NEUROPATHY, ISCHEMIC OPTIC 06/02/2007    Priority: Medium  . Essential hypertension 06/02/2007    Priority: Medium  . Insomnia 01/30/2015    Priority: Low  . Erectile dysfunction 01/30/2015    Priority: Low  . Former smoker 01/30/2015    Priority: Low  . Allergic rhinitis 10/11/2014    Priority: Low  . Sinoatrial node dysfunction     Priority: Low  . SPINAL STENOSIS, LUMBAR 08/22/2010    Priority: Low  . GERD 03/17/2010    Priority: Low   Medications- reviewed and updated Current Outpatient Prescriptions  Medication Sig Dispense Refill  . benazepril (LOTENSIN) 5 MG tablet TAKE  ONE TABLET BY MOUTH ONCE DAILY 90 tablet 0  . finasteride (PROSCAR) 5 MG tablet Take 1 tablet by mouth daily.    . fish oil-omega-3 fatty acids 1000 MG capsule Take 2 g by mouth daily.      . Multiple Vitamin (MULTIVITAMIN) tablet Take 1 tablet by mouth daily.      . naproxen (NAPROSYN) 500 MG tablet Take 500 mg by mouth daily as needed (pain).     Marland Kitchen omeprazole (PRILOSEC) 20 MG capsule TAKE ONE CAPSULE BY MOUTH DAILY. 90 capsule 0  . rivaroxaban (XARELTO) 20 MG TABS tablet Take 1 tablet (20 mg total) by mouth daily with supper. 30 tablet 11  . rosuvastatin (CRESTOR) 5 MG tablet Take 1 tablet (5 mg total) by mouth daily. 90 tablet 3  . tamsulosin (FLOMAX) 0.4 MG CAPS capsule Take 1 capsule (0.4 mg total) by mouth daily. 100 capsule 3  . tadalafil (CIALIS) 5 MG tablet Take 1 tablet (5 mg total) by mouth daily. (Patient not taking: Reported on 03/18/2015) 30 tablet 5  . zolpidem (AMBIEN) 5 MG tablet Take 1 tablet (5 mg total) by mouth at bedtime as needed. (Patient not taking: Reported on 03/18/2015) 90 tablet 3   Objective: BP 130/62 mmHg  Pulse 76  Temp(Src) 98.2 F (36.8 C)  Wt 168 lb (76.204 kg) Gen: NAD, resting comfortably CV: RRR no murmurs rubs or gallops No chest wall tenderness Lungs: CTAB no  crackles, wheeze, rhonchi Abdomen: soft/nontender/nondistended/normal bowel sounds. Ext: no edema Skin: warm, dry, no rash   EKG: NSR rate 76. Normal anxis, intervals. No obvious hypertrophy. No st or t wave changes.   Assessment/Plan:  Atypical Chest Pain -Reassuring EKG and troponin negative (stat ordered) so no ACS.  -From history, doubt cardiac as well but concerning risk factors (dad with MI before age 56, personal history CVA (states was related to catheterization though), age, hyperlipidemia, hypertension) -has appointment with his cardiologist tomorrow. Exercise stress test in 11/2013 reassuring though as well.  -doubt reflux as already on PPI, possible anxiety, possible  costochondritis though cannot reproduce on exam, possible as part of statin related myalgias, no cough/fever to suggest pna, no SOB to suggest pneumothorax or PE. Wells criteria would be low.  -if cleared by cards, would consider week off statin then restart every other day statin and if no improvement consider restarting statin and trying short course of nsaids for costochondritis.  -asked patient to hold off on swimming until evaluated by cards.   Return precautions advised.   Orders Placed This Encounter  Procedures  . Troponin I  . EKG 12-Lead   Results for orders placed or performed in visit on 03/18/15 (from the past 24 hour(s))  Troponin I     Status: None   Collection Time: 03/18/15  2:01 PM  Result Value Ref Range   TNIDX 0.00 0.00 - 0.06 ug/l

## 2015-03-19 ENCOUNTER — Ambulatory Visit: Payer: Medicare PPO | Admitting: Cardiology

## 2015-03-19 ENCOUNTER — Encounter: Payer: Self-pay | Admitting: Internal Medicine

## 2015-03-19 ENCOUNTER — Ambulatory Visit (INDEPENDENT_AMBULATORY_CARE_PROVIDER_SITE_OTHER): Payer: Medicare PPO | Admitting: Internal Medicine

## 2015-03-19 VITALS — BP 104/60 | HR 76 | Ht 68.0 in | Wt 166.8 lb

## 2015-03-19 DIAGNOSIS — M2550 Pain in unspecified joint: Secondary | ICD-10-CM

## 2015-03-19 DIAGNOSIS — R0789 Other chest pain: Secondary | ICD-10-CM

## 2015-03-19 DIAGNOSIS — R52 Pain, unspecified: Secondary | ICD-10-CM

## 2015-03-19 DIAGNOSIS — I4892 Unspecified atrial flutter: Secondary | ICD-10-CM

## 2015-03-19 DIAGNOSIS — I495 Sick sinus syndrome: Secondary | ICD-10-CM

## 2015-03-19 DIAGNOSIS — I4891 Unspecified atrial fibrillation: Secondary | ICD-10-CM

## 2015-03-19 DIAGNOSIS — I10 Essential (primary) hypertension: Secondary | ICD-10-CM | POA: Diagnosis not present

## 2015-03-19 DIAGNOSIS — Z950 Presence of cardiac pacemaker: Secondary | ICD-10-CM

## 2015-03-19 HISTORY — DX: Other chest pain: R07.89

## 2015-03-19 LAB — MDC_IDC_ENUM_SESS_TYPE_INCLINIC
Brady Statistic RA Percent Paced: 99 %
Brady Statistic RV Percent Paced: 0 %
Implantable Pulse Generator Serial Number: 66063261
Lead Channel Impedance Value: 448 Ohm
Lead Channel Pacing Threshold Amplitude: 0.3 V
Lead Channel Pacing Threshold Amplitude: 1.3 V
Lead Channel Pacing Threshold Pulse Width: 0.4 ms
Lead Channel Sensing Intrinsic Amplitude: 15 mV
Lead Channel Setting Pacing Amplitude: 2.1 V
Lead Channel Setting Pacing Pulse Width: 0.4 ms
MDC IDC MSMT BATTERY REMAINING PERCENTAGE: 70 %
MDC IDC MSMT LEADCHNL RA IMPEDANCE VALUE: 409 Ohm
MDC IDC MSMT LEADCHNL RA PACING THRESHOLD PULSEWIDTH: 0.4 ms
MDC IDC SET LEADCHNL RA PACING AMPLITUDE: 2.9 V

## 2015-03-19 LAB — TSH: TSH: 2.67 u[IU]/mL (ref 0.35–4.50)

## 2015-03-19 LAB — SEDIMENTATION RATE: SED RATE: 11 mm/h (ref 0–22)

## 2015-03-19 NOTE — Assessment & Plan Note (Signed)
His blood pressure is well controlled. No change in medical therapy. 

## 2015-03-19 NOTE — Assessment & Plan Note (Signed)
He will continue his current meds. He has not had any additional atrial fib. He will continue xarelto.

## 2015-03-19 NOTE — Assessment & Plan Note (Signed)
His symptoms are noncardiac. He does relate multiple aches all over however. I recommended that he obtain a TSH and erythrocyte sedimentation rate, and in ANA. I have counseled the patient on the nonexertional nature of his symptoms. If his discomfort should become related to exertion and or associated with shortness of breath, then he is instructed to call us.

## 2015-03-19 NOTE — Patient Instructions (Addendum)
Your physician recommends that you continue on your current medications as directed. Please refer to the Current Medication list given to you today.  Your physician wants you to follow-up in: [place]. You will receive a reminder letter in the mail two months in advance. If you don't receive a letter, please call our office to schedule the follow-up appointment.  Your physician wants you to follow-up in: [place].  You will receive a reminder letter in the mail two months in advance. If you don't receive a letter, please call our office to schedule the follow-up appointment.  Your physician recommends that you return for lab work today (SED RATE, ANA, TSH)

## 2015-03-19 NOTE — Assessment & Plan Note (Signed)
His Biotronik DDD PM is working normally. Will recheck in several months.  

## 2015-03-19 NOTE — Progress Notes (Signed)
HPI Mr. Aeschliman returns today for followup. He is a very pleasant middle-age male sinus node dysfunction, status post permanent pacemaker insertion. He also is a history of hypertension which has been well-controlled.  He saw his primary MD yesterday and c/o atypical, non-exertional chest pain. He denies sob or syncope. His pain does not radiate. When pressed he tells me he aches all over. He also c/o fatigue and weakness. No Known Allergies   Current Outpatient Prescriptions  Medication Sig Dispense Refill  . benazepril (LOTENSIN) 5 MG tablet TAKE ONE TABLET BY MOUTH ONCE DAILY 90 tablet 0  . finasteride (PROSCAR) 5 MG tablet Take 1 tablet by mouth daily.    . fish oil-omega-3 fatty acids 1000 MG capsule Take 2 g by mouth daily.      . Multiple Vitamin (MULTIVITAMIN) tablet Take 1 tablet by mouth daily.      . naproxen (NAPROSYN) 500 MG tablet Take 500 mg by mouth daily as needed (pain).     Marland Kitchen omeprazole (PRILOSEC) 20 MG capsule TAKE ONE CAPSULE BY MOUTH DAILY. 90 capsule 0  . rivaroxaban (XARELTO) 20 MG TABS tablet Take 1 tablet (20 mg total) by mouth daily with supper. 30 tablet 11  . rosuvastatin (CRESTOR) 5 MG tablet Take 1 tablet (5 mg total) by mouth daily. 90 tablet 3  . tadalafil (CIALIS) 5 MG tablet Take 5 mg by mouth daily as needed for erectile dysfunction.    . tamsulosin (FLOMAX) 0.4 MG CAPS capsule Take 1 capsule (0.4 mg total) by mouth daily. 100 capsule 3  . zolpidem (AMBIEN) 5 MG tablet Take 1 tablet (5 mg total) by mouth at bedtime as needed. (Patient taking differently: Take 5 mg by mouth at bedtime as needed for sleep. ) 90 tablet 3   No current facility-administered medications for this visit.     Past Medical History  Diagnosis Date  . HTN (hypertension)   . CVA (cerebral vascular accident)   . Ischemic optic neuropathy   . Other and unspecified hyperlipidemia   . Bradycardia   . Abscess     right posterior neck  . Spinal stenosis   . GERD (gastroesophageal  reflux disease)   . External hemorrhoids   . Elevated LFTs   . Sinoatrial node dysfunction     ROS:   All systems reviewed and negative except as noted in the HPI.   Past Surgical History  Procedure Laterality Date  . Cardiac catheterization  1996  . Cholecystectomy  02/07/2003  . Colonoscopy  06/23/2004  . Pacemaker insertion    . Eye surgery      left eye  . Abscess drainage      righ tposterior neck  . Left ingunial hernia  06/08/2011  . Right hip replacement      2012 Dr. Maureen Ralphs     Family History  Problem Relation Age of Onset  . Lung cancer Mother     mets to brain  . Heart attack Mother     39  . Hypertension Mother   . Heart attack Father     mid 17s, smoker. died 50.   . Diabetes Father   . Diabetes Paternal Grandfather      History   Social History  . Marital Status: Married    Spouse Name: N/A  . Number of Children: N/A  . Years of Education: N/A   Occupational History  . Not on file.   Social History Main Topics  . Smoking status: Former Smoker --  1.00 packs/day for 20 years    Types: Cigarettes    Quit date: 11/04/1991  . Smokeless tobacco: Never Used  . Alcohol Use: 0.0 oz/week    0 Standard drinks or equivalent per week     Comment: occ  . Drug Use: No  . Sexual Activity: Not on file   Other Topics Concern  . Not on file   Social History Narrative   Married. 1 child and 1 step child. 2 grandchildren.       Lawyer (part time)      Hobbies: swimming (states not fun), travel     BP 104/60 mmHg  Pulse 76  Ht 5\' 8"  (1.727 m)  Wt 166 lb 12.8 oz (75.66 kg)  BMI 25.37 kg/m2  Physical Exam:  Well appearing middle-aged man,NAD HEENT: Unremarkable Neck:  No JVD, no thyromegally Lungs:  Clear with no wheezes, rales, or rhonchi. HEART:  Regular rate rhythm, no murmurs, no rubs, no clicks Abd:  soft, positive bowel sounds, no organomegally, no rebound, no guarding Ext:  2 plus pulses, no edema, no cyanosis, no clubbing Skin:   No rashes no nodules Neuro:  CN II through XII intact, motor grossly intact   DEVICE  Normal device function.  See PaceArt for details.   Assess/Plan:

## 2015-03-20 LAB — ANA: ANA: NEGATIVE

## 2015-03-25 ENCOUNTER — Encounter: Payer: Self-pay | Admitting: Internal Medicine

## 2015-04-08 ENCOUNTER — Telehealth: Payer: Self-pay

## 2015-04-08 NOTE — Telephone Encounter (Signed)
On med list and in problem list-ok to fill

## 2015-04-08 NOTE — Telephone Encounter (Signed)
[facility] stating pt came by there today saying you were calling in a Rx for cialis for him, i didn't see anything notated. Do you recall this?

## 2015-04-09 ENCOUNTER — Telehealth: Payer: Self-pay | Admitting: Family Medicine

## 2015-04-09 MED ORDER — TADALAFIL 5 MG PO TABS: 5.0000 mg | ORAL_TABLET | Freq: Every day | ORAL | Status: DC | PRN

## 2015-04-09 NOTE — Telephone Encounter (Signed)
Medication refilled

## 2015-04-09 NOTE — Telephone Encounter (Signed)
LM on pt vm with above information.

## 2015-04-09 NOTE — Telephone Encounter (Signed)
I received a PA request for 5 mg of Cialis.  Patient's plan does not cover Cialis for BPH or ED.  Patient had a PA denied on 07/06/14 for the medication and the denial letter is scanned into the chart.  If patient wants Cialis, he must pay out of pocket.

## 2015-04-14 ENCOUNTER — Other Ambulatory Visit: Payer: Self-pay | Admitting: Internal Medicine

## 2015-04-26 ENCOUNTER — Telehealth: Payer: Self-pay | Admitting: Internal Medicine

## 2015-04-26 NOTE — Telephone Encounter (Signed)
New Prob   Pt has some questions regarding his pacemaker settings. Please call.

## 2015-04-30 NOTE — Telephone Encounter (Signed)
Follow Up  Pt calling about previous note. Please call back and dsicuss.

## 2015-05-02 ENCOUNTER — Telehealth: Payer: Self-pay | Admitting: Internal Medicine

## 2015-05-02 NOTE — Telephone Encounter (Signed)
The patient said that his settings were changed at his last appointment and he wants clarification that Dr. Lovena Le approves of the current settings.

## 2015-05-02 NOTE — Telephone Encounter (Signed)
[name] spoke w/ pt. Pt will be seen in device clinic 05/07/15 while Dr. Lovena Le is in office. Industry Ruby [employer]a) was contacted and will be present for the interrogation.

## 2015-05-02 NOTE — Telephone Encounter (Signed)
Appt made w/ device clinic while Dr. Lovena Le here.  ROV w/ device clinic 05/07/15 @2 :00.

## 2015-05-07 ENCOUNTER — Ambulatory Visit (INDEPENDENT_AMBULATORY_CARE_PROVIDER_SITE_OTHER): Payer: Medicare PPO | Admitting: *Deleted

## 2015-05-07 DIAGNOSIS — R0609 Other forms of dyspnea: Secondary | ICD-10-CM

## 2015-05-07 LAB — CUP PACEART INCLINIC DEVICE CHECK
Brady Statistic RA Percent Paced: 100 %
Date Time Interrogation Session: 20160607171045
Lead Channel Impedance Value: 429 Ohm
Lead Channel Pacing Threshold Pulse Width: 0.4 ms
Lead Channel Setting Pacing Amplitude: 1.9 V
MDC IDC MSMT LEADCHNL RA IMPEDANCE VALUE: 409 Ohm
MDC IDC MSMT LEADCHNL RA PACING THRESHOLD AMPLITUDE: 0.8 V
MDC IDC MSMT LEADCHNL RA PACING THRESHOLD PULSEWIDTH: 0.4 ms
MDC IDC MSMT LEADCHNL RV PACING THRESHOLD AMPLITUDE: 1.4 V
MDC IDC MSMT LEADCHNL RV SENSING INTR AMPL: 13.4 mV
MDC IDC PG SERIAL: 66063261
MDC IDC SET LEADCHNL RA PACING AMPLITUDE: 1.3 V
MDC IDC SET LEADCHNL RV PACING PULSEWIDTH: 0.4 ms
MDC IDC STAT BRADY RV PERCENT PACED: 0 %
Pulse Gen Model: 359524

## 2015-05-07 NOTE — Progress Notes (Signed)
Pt c/o of DOE while swimming. Pt has sensations described as chest tightness and pounding above his sternum during initial portion of exercise. His paced/sensed AV delay settings compared to interrogation on 01/01/15 are the same. Interrogation today shows changes were not made. Per advice of industry Ruby Cola), Individual dynamic AV delay changed to fixed w/ paced AV set to 33ms. Follow up as scheduled. No other changes. ROV w/ device clinic 10/16 & w/ GT 4/17.

## 2015-05-21 ENCOUNTER — Encounter: Payer: Self-pay | Admitting: Internal Medicine

## 2015-07-11 ENCOUNTER — Other Ambulatory Visit: Payer: Self-pay | Admitting: Family Medicine

## 2015-07-16 ENCOUNTER — Ambulatory Visit (INDEPENDENT_AMBULATORY_CARE_PROVIDER_SITE_OTHER): Payer: Medicare PPO | Admitting: Adult Health

## 2015-07-16 ENCOUNTER — Ambulatory Visit: Payer: Medicare PPO | Admitting: Family Medicine

## 2015-07-16 ENCOUNTER — Encounter: Payer: Self-pay | Admitting: Adult Health

## 2015-07-16 VITALS — BP 130/80 | Temp 98.2°F | Ht 68.0 in | Wt 165.8 lb

## 2015-07-16 DIAGNOSIS — L0291 Cutaneous abscess, unspecified: Secondary | ICD-10-CM | POA: Diagnosis not present

## 2015-07-16 MED ORDER — SULFAMETHOXAZOLE-TRIMETHOPRIM 800-160 MG PO TABS: 1.0000 | ORAL_TABLET | Freq: Two times a day (BID) | ORAL | Status: DC

## 2015-07-16 NOTE — Patient Instructions (Addendum)
I have sent a prescription to the pharmacy for Bactrim, take this twice a day for 7 days.   Keep the area clean and dry  Monitor for any signs or symptoms of infection, if you notice any, please follow up with me.   Enjoy your vacation!  Incision and Drainage Care After Refer to this sheet in the next few weeks. These instructions provide you with information on caring for yourself after your procedure. Your caregiver may also give you more specific instructions. Your treatment has been planned according to current medical practices, but problems sometimes occur. Call your caregiver if you have any problems or questions after your procedure. HOME CARE INSTRUCTIONS   If antibiotic medicine is given, take it as directed. Finish it even if you start to feel better.  Only take over-the-counter or prescription medicines for pain, discomfort, or fever as directed by your caregiver.  Keep all follow-up appointments as directed by your caregiver.  Change any bandages (dressings) as directed by your caregiver. Replace old dressings with clean dressings.  Wash your hands before and after caring for your wound. You will receive specific instructions for cleansing and caring for your wound.  SEEK MEDICAL CARE IF:   You have increased pain, swelling, or redness around the wound.  You have increased drainage, smell, or bleeding from the wound.  You have muscle aches, chills, or you feel generally sick.  You have a fever. MAKE SURE YOU:   Understand these instructions.  Will watch your condition.  Will get help right away if you are not doing well or get worse. Document Released: 02/08/2012 Document Reviewed: 02/08/2012 [facility] Patient Information 2015 [place]. This information is not intended to replace advice given to you by your health care provider. Make sure you discuss any questions you have with your health care provider.

## 2015-07-16 NOTE — Progress Notes (Signed)
   Subjective:    Patient ID: [name], male    DOB: 1946-06-20, 69 y.o.   MRN: [id number]  HPI  69 year old male who presents to the office today for cyst on neck that he has noticed that over the last 2-3 days has been growing in size and has started to hurt. He endorses having a cyst in the same area a few years ago. He denies any drainage for cyst.   Review of Systems  Constitutional: Negative.  Negative for fever.  Skin:       Cyst on neck  Neurological: Negative for dizziness and headaches.  All other systems reviewed and are negative.      Objective:   Physical Exam  Constitutional: He is oriented to person, place, and time. He appears well-developed and well-nourished. No distress.  HENT:  Head: Normocephalic and atraumatic.  Right Ear: External ear normal.  Left Ear: External ear normal.  Nose: Nose normal.  Mouth/Throat: Oropharynx is clear and moist. No oropharyngeal exudate.  Eyes: Right eye exhibits no discharge. Left eye exhibits no discharge.  Neck: Normal range of motion. Neck supple.    Musculoskeletal: Normal range of motion.  Neurological: He is alert and oriented to person, place, and time.  Skin: Skin is warm and dry. He is not diaphoretic.  Quarter sized sebaceous cyst on right side of neck. Pain with palpation. Slightly red, but not warm and with no discharge.   Psychiatric: He has a normal mood and affect. His behavior is normal. Judgment and thought content normal.  Nursing note and vitals reviewed.     Assessment & Plan:  1. Abscess Procedure:  Incision and drainage of abscess Risks, benefits, and alternatives explained and consent obtained. Time out conducted. Surface cleaned with alcohol.  1.5cc lidocaine with epinephine infiltrated around abscess. Adequate anesthesia ensured. Area prepped and draped in a sterile fashion. #11 blade used to make a stab incision into abscess. Pus expressed with pressure. Curved hemostat used to  explore 4 quadrants and loculations broken up. Further purulence expressed. 0.5 inches of iodoform packing placed leaving a 1-inch tail. Hemostasis achieved. Pt stable. Aftercare and follow-up advised. - sulfamethoxazole-trimethoprim (BACTRIM DS) 800-160 MG per tablet; Take 1 tablet by mouth 2 (two) times daily.  Dispense: 14 tablet; Refill: 0 - Follow up with any signs or symptoms of infection - Pull wick in 24 hours - Keep covered, clean and dry.

## 2015-07-16 NOTE — Progress Notes (Signed)
Pre visit review using our clinic review tool, if applicable. No additional management support is needed unless otherwise documented below in the visit note. 

## 2015-07-18 ENCOUNTER — Other Ambulatory Visit: Payer: Self-pay | Admitting: Adult Health

## 2015-07-18 ENCOUNTER — Encounter: Payer: Self-pay | Admitting: Adult Health

## 2015-07-18 ENCOUNTER — Telehealth: Payer: Self-pay | Admitting: Family Medicine

## 2015-07-18 ENCOUNTER — Ambulatory Visit (INDEPENDENT_AMBULATORY_CARE_PROVIDER_SITE_OTHER): Payer: Medicare PPO | Admitting: Adult Health

## 2015-07-18 VITALS — Temp 98.3°F | Ht 68.0 in | Wt 165.0 lb

## 2015-07-18 DIAGNOSIS — Z5189 Encounter for other specified aftercare: Secondary | ICD-10-CM

## 2015-07-18 MED ORDER — CLINDAMYCIN HCL 300 MG PO CAPS: 300.0000 mg | ORAL_CAPSULE | Freq: Three times a day (TID) | ORAL | Status: DC

## 2015-07-18 NOTE — Telephone Encounter (Signed)
Pls advise.  

## 2015-07-18 NOTE — Telephone Encounter (Signed)
Pt was notified rx for clindamycin was sent to pharmacy([facility]).

## 2015-07-18 NOTE — Telephone Encounter (Signed)
[facility]     Patient Name: Alm Bustard Client [facility] - Client    Client Site [place] - Day    Physician Garret Reddish   Gender: Male Contact Type Call  DOB: [birth year]  Call Type Triage / Clinical  Age: 69 Y 41 M 30 D Relationship To Patient Self  Return Phone Number: [phone] (Primary) Return Phone Number [phone] (Primary)  Address: [address]  Chief Complaint Prescription Refill or Medication Request (non symptomatic)  City/State/Zip: [place] [address] Initial Comment Caller states Bactrum making him nauseated and having diarrhea, Hasn't heard back from anyone. Asking for different abx    Nurse Assessment  Nurse: Amalia Hailey, RN, Melissa Date/Time (Eastern Time): 07/18/2015 4:22:40 PM  Confirm and document reason for call. If symptomatic, describe symptoms. ---Caller states Bactrim making him nauseated and having diarrhea, Hasn't heard back from anyone. Asking for different abx  Has the patient traveled out of the country within the last 30 days? ---Not Applicable  Does the patient require triage? ---Yes  Related visit to physician within the last 2 weeks? ---Yes  Does the PT have any chronic conditions? (i.e. diabetes, asthma, etc.) ---Yes  List chronic conditions. ---hypertension, pacemaker, A-fib, take Xarelto,    Guidelines      Guideline Title Affirmed Question Affirmed Notes Nurse Date/Time (Eastern Time)        Disp. Time Eilene Ghazi Time) Disposition Final User                 After Care Instructions Given     Call Event Type User Date / Time Description             Comments  User: Colin Ina, RN Date/Time (Eastern Time): 07/18/2015 4:41:01 PM  Caller became very agitated and expressed that he just wanted to get another antibiotic, did not want evaluation of his symptoms, has already communicated this  with the office. Caller reports he is going on a trip tomorrow and doesn't want to be having diarrhea /nausea and the only thing changed has been taking the antibiotic. Caller reports has been at the office this morning since being seen on Tuesday for a lancing procedure for cyst on his neck. Caller also reports he has not heard back from the office. Caller was placed on hold and discussed his concerns with the office personnel Joellyn Rued and Arbie Cookey. Informed that the nurse has already left for the day, Dr. Yong Channel is still with patients and can not be interrupted. This information will be communicated to Dr. Yong Channel and he will, after reviewing the pt.'s chart, get back with him before the day is out. Caller was then informed of this and verbalized he understood. Caller advised if does not get a call back in a reasonable amount of time-including after office hrs conclude and Dr. Yong Channel can review the chart to get in touch, to call back. Caller verb. understood this directive.

## 2015-07-18 NOTE — Progress Notes (Signed)
Pre visit review using our clinic review tool, if applicable. No additional management support is needed unless otherwise documented below in the visit note. 

## 2015-07-18 NOTE — Progress Notes (Addendum)
Subjective:    Patient ID: [name], male    DOB: 06/15/1946, 69 y.o.   MRN: [id number]  HPI 69 year old male presents to the office today regarding cyst on his neck. The cyst was I&D on 07/16/2015, started on Bactrim DS 2 times daily for 7 days. Concerned today becauseas he did not notice that the drain was removed at home believes that on is still inside the cavity. The area around the cyst continues to be red and slightly painful but that his pain has diminished since I&D and starting antibiotics.Endorses very little drainage from wound. Has no complaints to diarrhea or nausea after starting abx. He has no other complaints at this time.     Review of Systems  Constitutional: Negative for fever, chills and fatigue.  Musculoskeletal:       Slight pain around cyst   Skin: Positive for color change (decreased rednesss around cyst) and wound.   Past Medical History  Diagnosis Date  . HTN (hypertension)   . CVA (cerebral vascular accident)   . Ischemic optic neuropathy   . Other and unspecified hyperlipidemia   . Bradycardia   . Abscess     right posterior neck  . Spinal stenosis   . GERD (gastroesophageal reflux disease)   . External hemorrhoids   . Elevated LFTs   . Sinoatrial node dysfunction     Social History   Social History  . Marital Status: Married    Spouse Name: N/A  . Number of Children: N/A  . Years of Education: N/A   Occupational History  . Not on file.   Social History Main Topics  . Smoking status: Former Smoker -- 1.00 packs/day for 20 years    Types: Cigarettes    Quit date: 11/04/1991  . Smokeless tobacco: Never Used  . Alcohol Use: 0.0 oz/week    0 Standard drinks or equivalent per week     Comment: occ  . Drug Use: No  . Sexual Activity: Not on file   Other Topics Concern  . Not on file   Social History Narrative   Married. 1 child and 1 step child. 2 grandchildren.       Lawyer (part time)      Hobbies: swimming (states not  fun), travel    Past Surgical History  Procedure Laterality Date  . Cardiac catheterization  1996  . Cholecystectomy  02/07/2003  . Colonoscopy  06/23/2004  . Pacemaker insertion    . Eye surgery      left eye  . Abscess drainage      righ tposterior neck  . Left ingunial hernia  06/08/2011  . Right hip replacement      2012 Dr. Maureen Ralphs    Family History  Problem Relation Age of Onset  . Lung cancer Mother     mets to brain  . Heart attack Mother     36  . Hypertension Mother   . Heart attack Father     mid 86s, smoker. died 92.   . Diabetes Father   . Diabetes Paternal Grandfather     No Known Allergies  Current Outpatient Prescriptions on File Prior to Visit  Medication Sig Dispense Refill  . benazepril (LOTENSIN) 5 MG tablet TAKE ONE TABLET BY MOUTH ONCE DAILY 90 tablet 3  . finasteride (PROSCAR) 5 MG tablet Take 1 tablet by mouth daily.    . fish oil-omega-3 fatty acids 1000 MG capsule Take 2 g by mouth daily.      Marland Kitchen  Multiple Vitamin (MULTIVITAMIN) tablet Take 1 tablet by mouth daily.      . naproxen (NAPROSYN) 500 MG tablet Take 500 mg by mouth daily as needed (pain).     Marland Kitchen omeprazole (PRILOSEC) 20 MG capsule TAKE ONE CAPSULE BY MOUTH DAILY. 90 capsule 0  . rivaroxaban (XARELTO) 20 MG TABS tablet Take 1 tablet (20 mg total) by mouth daily with supper. 30 tablet 11  . rosuvastatin (CRESTOR) 5 MG tablet Take 1 tablet (5 mg total) by mouth daily. 90 tablet 3  . sulfamethoxazole-trimethoprim (BACTRIM DS) 800-160 MG per tablet Take 1 tablet by mouth 2 (two) times daily. 14 tablet 0  . tadalafil (CIALIS) 5 MG tablet Take 1 tablet (5 mg total) by mouth daily as needed for erectile dysfunction. 10 tablet 2  . tamsulosin (FLOMAX) 0.4 MG CAPS capsule Take 1 capsule (0.4 mg total) by mouth daily. 100 capsule 3  . zolpidem (AMBIEN) 5 MG tablet Take 1 tablet (5 mg total) by mouth at bedtime as needed. (Patient taking differently: Take 5 mg by mouth at bedtime as needed for sleep.  ) 90 tablet 3   No current facility-administered medications on file prior to visit.    Temp(Src) 98.3 F (36.8 C) (Oral)  Ht 5\' 8"  (1.727 m)  Wt 165 lb (74.844 kg)  BMI 25.09 kg/m2       Objective:   Physical Exam  Constitutional: He is oriented to person, place, and time. He appears well-developed and well-nourished. No distress.  Musculoskeletal: Normal range of motion. He exhibits tenderness. He exhibits no edema.  Neurological: He is alert and oriented to person, place, and time.  Skin: Skin is warm and dry. He is not diaphoretic. There is erythema.  -Continues to have inflammation around the cyst cavity area. -Pus noticed in wound. +fluctuance. Easily expressed small amount of pus from wound.  - Drain not actively noticed in wound. Expressed to patient that the drain was approx one inch long. He endorses " I thought it was longer, yeah I think that came out when I took the bandage off.   Nursing note and vitals reviewed.      Assessment & Plan:  1. Wound check, abscess - Patient seemed irritated that his wound had not completely healed after three days of abx. It was explained to him by me and Dr. Elease Hashimoto that it will take multiple days for the wound to heal completely. He seemed to have an understanding of this. - Consulted Dr. Elease Hashimoto about possibly changing abx therapy. Upon his review of wound, he did not think that it was appropriate to change antibiotic therapy at this time.  - Continue to monitor for signs and symptoms of infection - Follow up with any signs of infection or fever.

## 2015-07-18 NOTE — Telephone Encounter (Signed)
Prescription for clindamycin sent into his pharmacy. Take 3 times a day for 7 days. There is no guarantee that this antibiotic will not cause diarrhea as this can be a side effect of any that are prescribed.

## 2015-07-18 NOTE — Telephone Encounter (Signed)
Pt states the sulfamethoxazole-trimethoprim (BACTRIM DS) 800-160 MG per tablet Is not agreeing w/ pt. Pt has diarehea, nausea and would like another med.\  [email or web address]  Pt is leaving in the am to drive 7 hours and needs asap.

## 2015-08-08 ENCOUNTER — Ambulatory Visit (INDEPENDENT_AMBULATORY_CARE_PROVIDER_SITE_OTHER): Payer: Medicare PPO | Admitting: Family Medicine

## 2015-08-08 ENCOUNTER — Encounter: Payer: Self-pay | Admitting: Family Medicine

## 2015-08-08 VITALS — BP 118/80 | HR 72 | Temp 98.5°F | Wt 161.0 lb

## 2015-08-08 DIAGNOSIS — R972 Elevated prostate specific antigen [PSA]: Secondary | ICD-10-CM

## 2015-08-08 DIAGNOSIS — G4700 Insomnia, unspecified: Secondary | ICD-10-CM | POA: Diagnosis not present

## 2015-08-08 DIAGNOSIS — E785 Hyperlipidemia, unspecified: Secondary | ICD-10-CM

## 2015-08-08 DIAGNOSIS — R351 Nocturia: Secondary | ICD-10-CM

## 2015-08-08 DIAGNOSIS — Z23 Encounter for immunization: Secondary | ICD-10-CM

## 2015-08-08 DIAGNOSIS — N401 Enlarged prostate with lower urinary tract symptoms: Secondary | ICD-10-CM

## 2015-08-08 DIAGNOSIS — R21 Rash and other nonspecific skin eruption: Secondary | ICD-10-CM

## 2015-08-08 LAB — PSA: PSA: 0.91 ng/mL (ref 0.10–4.00)

## 2015-08-08 LAB — LDL CHOLESTEROL, DIRECT: LDL DIRECT: 92 mg/dL

## 2015-08-08 MED ORDER — PREDNISONE 20 MG PO TABS: ORAL_TABLET | ORAL | Status: DC

## 2015-08-08 MED ORDER — TAMSULOSIN HCL 0.4 MG PO CAPS: 0.4000 mg | ORAL_CAPSULE | Freq: Every day | ORAL | Status: DC

## 2015-08-08 MED ORDER — ZOLPIDEM TARTRATE 5 MG PO TABS
5.0000 mg | ORAL_TABLET | Freq: Every evening | ORAL | Status: DC | PRN
Start: 2015-08-08 — End: 2016-08-17

## 2015-08-08 NOTE — Progress Notes (Signed)
Garret Reddish, MD  Subjective:  [name] is a 69 y.o. year old very pleasant male patient who presents for/with See problem oriented charting ROS- not ill appearing, no fever/chills. No new medications. Not immunocompromised. No mucus membrane involvement. No chest pain or shortness of breath. Saw cards- palpitations were considered noncardiac.   Past Medical History-  Patient Active Problem List   Diagnosis Date Noted  . CVA (cerebral infarction) 01/30/2015    Priority: High  . Atrial fibrillation and flutter 01/01/2015    Priority: High  . Ventricular tachycardia, non-sustained 01/25/2013    Priority: High  . Cardiac pacemaker in situ 12/28/2009    Priority: High  . BPH associated with nocturia 08/14/2014    Priority: Medium  . Hyperlipidemia 07/12/2007    Priority: Medium  . NEUROPATHY, ISCHEMIC OPTIC 06/02/2007    Priority: Medium  . Essential hypertension 06/02/2007    Priority: Medium  . Left chest pressure 03/19/2015    Priority: Low  . Insomnia 01/30/2015    Priority: Low  . Erectile dysfunction 01/30/2015    Priority: Low  . Former smoker 01/30/2015    Priority: Low  . Allergic rhinitis 10/11/2014    Priority: Low  . Sinoatrial node dysfunction     Priority: Low  . SPINAL STENOSIS, LUMBAR 08/22/2010    Priority: Low  . GERD 03/17/2010    Priority: Low    Medications- reviewed and updated Current Outpatient Prescriptions  Medication Sig Dispense Refill  . benazepril (LOTENSIN) 5 MG tablet TAKE ONE TABLET BY MOUTH ONCE DAILY 90 tablet 3  . finasteride (PROSCAR) 5 MG tablet Take 1 tablet by mouth daily.    . fish oil-omega-3 fatty acids 1000 MG capsule Take 2 g by mouth daily.      . Multiple Vitamin (MULTIVITAMIN) tablet Take 1 tablet by mouth daily.      . naproxen (NAPROSYN) 500 MG tablet Take 500 mg by mouth daily as needed (pain).     Higgins Kitchen omeprazole (PRILOSEC) 20 MG capsule TAKE ONE CAPSULE BY MOUTH DAILY. 90 capsule 0  . rivaroxaban (XARELTO)  20 MG TABS tablet Take 1 tablet (20 mg total) by mouth daily with supper. 30 tablet 11  . rosuvastatin (CRESTOR) 5 MG tablet Take 1 tablet (5 mg total) by mouth daily. 90 tablet 3  . tamsulosin (FLOMAX) 0.4 MG CAPS capsule Take 1 capsule (0.4 mg total) by mouth daily. 100 capsule 3  . zolpidem (AMBIEN) 5 MG tablet Take 1 tablet (5 mg total) by mouth at bedtime as needed for sleep. 90 tablet 1  . clindamycin (CLEOCIN) 300 MG capsule Take 1 capsule (300 mg total) by mouth 3 (three) times daily. 21 capsule 0  . predniSONE (DELTASONE) 20 MG tablet Take 1 tab for 5 days, then a 1/2 tab for 5 days, then stop. Stop also if rash were to worsen and see dermatology 8 tablet 0  . tadalafil (CIALIS) 5 MG tablet Take 1 tablet (5 mg total) by mouth daily as needed for erectile dysfunction. (Patient not taking: Reported on 08/08/2015) 10 tablet 2   No current facility-administered medications for this visit.    Objective: BP 118/80 mmHg  Pulse 72  Temp(Src) 98.5 F (36.9 C)  Wt 161 lb (73.029 kg) Gen: NAD, resting comfortably CV: RRR no murmurs rubs or gallops Lungs: CTAB no crackles, wheeze, rhonchi Abdomen: soft/nontender/nondistended/normal bowel sounds. No rebound or guarding.  Ext: no edema Skin: warm, dry, mild scaling above prior I+D lesion. fine scaling rash  on back with some erythematous papules  Neuro: grossly normal, moves all extremities  Assessment/Plan:  Insomnia S: well controlled on ambien 5mg  prn A/P: continue rx- refilled   BPH associated with nocturia S: unclear cause elevated PSA up to >14. Returned to 1.16 -3 months ago. Today down to 0.91. Symptoms controlled on finasteride and flomax. Saw [facility]- still need records A/P: continue finasteride and flomax. Check in on records next visit.    Hyperlipidemia S: LDL goal technically <70 with CVA history. Mild poor control but still <100. Myalgias on daily dose, resolved on every other day A/P:Continue current meds:  crestor  5mg  every other day- recognizing some increased risk with LDL not <70   Rash S:wonders if lactose intolerant. Rash throughout whole back. Presents spot on mid back present 6 months-saw derm and tried antifungal spray without relief. 2-3 weeks ago rash extended to most of back. Dry scale, some erythematous papules. Does not want to use a steroid cream as extensive. Athlete's foot spray has not helped. No new contacts. No new medicines other than antibiotic. Not getting better. Itches.  A/P: declines topical, failed antifungal. Trial oral prednisone, stop if worsens, encouraged derm follow up if does not improve.   6 months. Return precautions advised.  Hep a 1st of 2 shots today- [place] Neck looks better- advised not to scratch it Bowel movements affected- consistency of grits- 3 weeks ago, discussed likely due to antibiotics recently for neck. Trial probiotics.    Orders Placed This Encounter  Procedures  . Flu vaccine HIGH DOSE PF (Fluzone High dose)  . Hepatitis A vaccine adult IM  . PSA  . LDL cholesterol, direct    [place]    Meds ordered this encounter  Medications  . zolpidem (AMBIEN) 5 MG tablet    Sig: Take 1 tablet (5 mg total) by mouth at bedtime as needed for sleep.    Dispense:  90 tablet    Refill:  1  . predniSONE (DELTASONE) 20 MG tablet    Sig: Take 1 tab for 5 days, then a 1/2 tab for 5 days, then stop. Stop also if rash were to worsen and see dermatology    Dispense:  8 tablet    Refill:  0  . tamsulosin (FLOMAX) 0.4 MG CAPS capsule    Sig: Take 1 capsule (0.4 mg total) by mouth daily.    Dispense:  100 capsule    Refill:  3

## 2015-08-08 NOTE — Assessment & Plan Note (Signed)
S: well controlled on ambien 5mg  prn A/P: continue rx- refilled

## 2015-08-08 NOTE — Patient Instructions (Addendum)
Ambien refilled  Unclear cause of rash- has not responded to antifungals. DId not want to use topical steroid. Trial 10 days of prednisone, follow up with derm if does not resolve  Bowels likely related to antibiotic- trial activia or other probiotic  Strongly doubt this is lactose related- can resume milk   Check bad cholesterol/LDL as well as PSA  Hepatitis A before you leave, need another in 6 months Already got flu shot  See me at least within 6 months, sooner if you need [place]

## 2015-08-08 NOTE — Assessment & Plan Note (Signed)
S: LDL goal technically <70 with CVA history. Mild poor control but still <100. Myalgias on daily dose, resolved on every other day A/P:Continue current meds:  crestor 5mg  every other day- recognizing some increased risk with LDL not <70

## 2015-08-08 NOTE — Assessment & Plan Note (Addendum)
S: unclear cause elevated PSA up to >14. Returned to 1.16 -3 months ago. Today down to 0.91. Symptoms controlled on finasteride and flomax. Saw [facility]- still need records A/P: continue finasteride and flomax. Check in on records next visit.

## 2015-08-12 ENCOUNTER — Encounter: Payer: Medicare PPO | Admitting: *Deleted

## 2015-08-12 ENCOUNTER — Encounter: Payer: Self-pay | Admitting: Cardiovascular Disease

## 2015-08-12 ENCOUNTER — Telehealth: Payer: Self-pay | Admitting: Physician Assistant

## 2015-08-12 ENCOUNTER — Ambulatory Visit (INDEPENDENT_AMBULATORY_CARE_PROVIDER_SITE_OTHER): Payer: Medicare PPO | Admitting: Cardiovascular Disease

## 2015-08-12 DIAGNOSIS — I4892 Unspecified atrial flutter: Secondary | ICD-10-CM

## 2015-08-12 DIAGNOSIS — I10 Essential (primary) hypertension: Secondary | ICD-10-CM

## 2015-08-12 LAB — BRAIN NATRIURETIC PEPTIDE: PRO B NATRI PEPTIDE: 89 pg/mL (ref 0.0–100.0)

## 2015-08-12 LAB — BASIC METABOLIC PANEL
BUN: 18 mg/dL (ref 6–23)
CALCIUM: 9.5 mg/dL (ref 8.4–10.5)
CO2: 27 mEq/L (ref 19–32)
Chloride: 103 mEq/L (ref 96–112)
Creatinine, Ser: 1.05 mg/dL (ref 0.40–1.50)
GFR: 74.47 mL/min (ref >60.00)
Glucose, Bld: 124 mg/dL — ABNORMAL HIGH (ref 70–99)
POTASSIUM: 4.3 meq/L (ref 3.5–5.1)
SODIUM: 138 meq/L (ref 135–145)

## 2015-08-12 LAB — CBC
HCT: 43.8 % (ref 39.0–52.0)
Hemoglobin: 15 g/dL (ref 13.0–17.0)
MCHC: 34.2 g/dL (ref 30.0–36.0)
MCV: 87 fl (ref 78.0–100.0)
Platelets: 231 10*3/uL (ref 150.0–400.0)
RBC: 5.03 Mil/uL (ref 4.22–5.81)
RDW: 13.4 % (ref 11.5–15.5)
WBC: 9.3 10*3/uL (ref 4.0–10.5)

## 2015-08-12 NOTE — Progress Notes (Signed)
Cardiology Office Note Date:  08/14/2015   ID:  [name], DOB 1946-07-21, MRN [id number]  PCP:  Garret Reddish, MD  Cardiologist:  Sherren Mocha, MD    Chief Complaint  Patient presents with  . Loss of Consciousness    History of Present Illness: [name] is a 69 y.o. male who presents for evaluation of syncope. The patient walked in to the clinic today with this complaint.  He woke up at 6:30 am to urinate this morning. While he was waiting to void, he passed out and describes this as his 'legs collapsing' beneath him. This all happened suddenly and he had no warning. He denies any past history of similar episodes. He denies any recent chest pain, chest pressure, or heart palpitations. He does admit to DOE, and symptoms have been worse over the past few months.   He has been followed by Dr Lovena Le and has a history of sinus node dysfunction and permanent pacemaker placement. He also has a hx of HTN and PAF maintained on chronic anticoagulation with Xarelto.  Past Medical History  Diagnosis Date  . HTN (hypertension)   . CVA (cerebral vascular accident)   . Ischemic optic neuropathy   . Other and unspecified hyperlipidemia   . Bradycardia   . Abscess     right posterior neck  . Spinal stenosis   . GERD (gastroesophageal reflux disease)   . External hemorrhoids   . Elevated LFTs   . Sinoatrial node dysfunction     Past Surgical History  Procedure Laterality Date  . Cardiac catheterization  1996  . Cholecystectomy  02/07/2003  . Colonoscopy  06/23/2004  . Pacemaker insertion    . Eye surgery      left eye  . Abscess drainage      righ tposterior neck  . Left ingunial hernia  06/08/2011  . Right hip replacement      2012 Dr. Maureen Ralphs    Current Outpatient Prescriptions  Medication Sig Dispense Refill  . benazepril (LOTENSIN) 5 MG tablet TAKE ONE TABLET BY MOUTH ONCE DAILY 90 tablet 3  . clindamycin (CLEOCIN) 300 MG capsule Take 1 capsule (300  mg total) by mouth 3 (three) times daily. 21 capsule 0  . finasteride (PROSCAR) 5 MG tablet Take 1 tablet by mouth daily.    . fish oil-omega-3 fatty acids 1000 MG capsule Take 2 g by mouth daily.      . Multiple Vitamin (MULTIVITAMIN) tablet Take 1 tablet by mouth daily.      . naproxen (NAPROSYN) 500 MG tablet Take 500 mg by mouth daily as needed (pain).     Marland Kitchen omeprazole (PRILOSEC) 20 MG capsule TAKE ONE CAPSULE BY MOUTH DAILY. 90 capsule 0  . predniSONE (DELTASONE) 20 MG tablet Take 1 tab for 5 days, then a 1/2 tab for 5 days, then stop. Stop also if rash were to worsen and see dermatology 8 tablet 0  . rivaroxaban (XARELTO) 20 MG TABS tablet Take 1 tablet (20 mg total) by mouth daily with supper. 30 tablet 11  . rosuvastatin (CRESTOR) 5 MG tablet Take 1 tablet (5 mg total) by mouth daily. 90 tablet 3  . tadalafil (CIALIS) 5 MG tablet Take 1 tablet (5 mg total) by mouth daily as needed for erectile dysfunction. (Patient not taking: Reported on 08/08/2015) 10 tablet 2  . tamsulosin (FLOMAX) 0.4 MG CAPS capsule Take 1 capsule (0.4 mg total) by mouth daily. 100 capsule 3  . zolpidem (AMBIEN) 5  MG tablet Take 1 tablet (5 mg total) by mouth at bedtime as needed for sleep. 90 tablet 1   No current facility-administered medications for this visit.    Allergies:   Review of patient's allergies indicates no known allergies.   Social History:  The patient  reports that he quit smoking about 23 years ago. His smoking use included Cigarettes. He has a 20 pack-year smoking history. He has never used smokeless tobacco. He reports that he drinks alcohol. He reports that he does not use illicit drugs.   Family History:  The patient's family history includes Diabetes in his father and paternal grandfather; Heart attack in his father and mother; Hypertension in his mother; Lung cancer in his mother.    ROS:  Please see the history of present illness.  Recent immunizations with yellow fever, typhoid, flu, and  Hep A vaccines (3 days ago).  All other systems are reviewed and negative.   PHYSICAL EXAM: VS:  SpO2 95% , BMI There is no weight on file to calculate BMI. GEN: Well nourished, well developed, in no acute distress HEENT: normal Neck: no JVD, no masses. No carotid bruits Cardiac: tachycardic and regular without murmur or gallop                Respiratory:  clear to auscultation bilaterally, normal work of breathing GI: soft, nontender, nondistended, + BS MS: no deformity or atrophy Ext: no pretibial edema, pedal pulses 2+= bilaterally Skin: warm and dry, no rash Neuro:  Strength and sensation are intact Psych: euthymic mood, full affect  EKG:  EKG is ordered today. The ekg ordered today shows atrial flutter with 2:1 block, nonspecific ST change, heart rate 129 bpm  Recent Labs: 01/30/2015: ALT 16 03/19/2015: TSH 2.67 08/12/2015: BUN 18; Creatinine, Ser 1.05; Hemoglobin 15.0; Platelets 231.0; Potassium 4.3; Pro B Natriuretic peptide (BNP) 89.0; Sodium 138   Lipid Panel     Component Value Date/Time   CHOL 173 01/30/2015 1103   TRIG 294.0* 01/30/2015 1103   TRIG 215* 11/03/2006 1051   HDL 39.00* 01/30/2015 1103   CHOLHDL 4 01/30/2015 1103   CHOLHDL 3.5 CALC 11/03/2006 1051   VLDL 58.8* 01/30/2015 1103   LDLCALC 60 04/17/2014 1036   LDLDIRECT 92.0 08/08/2015 1111   LDLDIRECT 84.4 11/03/2006 1051      Wt Readings from Last 3 Encounters:  08/08/15 161 lb (73.029 kg)  07/18/15 165 lb (74.844 kg)  07/16/15 165 lb 12.8 oz (75.206 kg)     Cardiac Studies Reviewed: Dobutamine Echo 03/09/2011: Baseline:  - The estimated LV ejection fraction was 60%. - Normal wall motion; no LV regional wall motion abnormalities. Low dose:  - The estimated LV ejection fraction was 65%. - Normal wall motion; no LV regional wall motion abnormalities. Peak stress:  - The estimated LV ejection fraction was 70%. - Normal wall motion; no LV regional wall motion abnormalities. Recovery:  - The  estimated LV ejection fraction was 65%. - Normal wall motion; no LV regional wall motion abnormalities.   ASSESSMENT AND PLAN: 1.  Syncope 2. Atrial flutter with RVR 3. S/p permanent pacemaker 4. DOE 5. Use of an anticoagulant drug  Initially the patient is evaluated with orthostatic vital signs and his heart rate is noted to be 128 bpm. An EKG shows atrial flutter. The patient is left on the monitor and he converted back to sinus rhythm spontaneously after about 15 minutes. I suspect this is incidental as his device interrogation did not show any  evidence of arrhythmia this am around the time of his syncopal episode. By history it sounds as if he had micturition syncope.   Plan:  Check CBC, BMET, BNP  2D Echo  Follow-up Chanetta Marshall, NP 2 weeks  Push fluids   Current medicines are reviewed with the patient today.  The patient does not have concerns regarding medicines.  Labs/ tests ordered today include:   Orders Placed This Encounter  Procedures  . Basic Metabolic Panel (BMET)  . B Nat Peptide  . CBC  . EKG 12-Lead  . Echocardiogram    Disposition:   FU as above  Signed, Sherren Mocha, MD  08/14/2015 10:19 PM    [facility] [address], [place], [place]  [address] Phone: [phone]; Fax: [phone]

## 2015-08-12 NOTE — Telephone Encounter (Signed)
Patient with h/o HTN, SSS s/p biotronik PPM and proxysmal atrial flutter/fibrillation on xarelto was seen in the office today after syncopal episode during urination. While in the office, per pt, he had 2 EKGs (not available in EPIC), first EKG showed normal rhythm, second EKG showed aflutter/afib, while waiting in device clinic, he spontaneously converted to NSR and was sent home.   Around 6pm, he feels he went into afib aflutter again, HR 120s. He called the after hour cardiology service for advise.   It appears patient is going in and out of afib/aflutter, he says the PPM was placed >10 yrs ago and his aflib/flutter was only diagnosed in Feb of this yr. His TSH in Apr was normal. I told him if he does not convert by 8pm, he should come to [facility] ED for further evaluation. Surprisingly he is not on any rate control medication, he needs rate control medication. Given he take xarelto, he is protected from stroke perspective.  Hilbert Corrigan PA Pager: [phone]

## 2015-08-12 NOTE — Patient Instructions (Addendum)
Medication Instructions:  Your physician recommends that you continue on your current medications as directed. Please refer to the Current Medication list given to you today.  Labwork: Your physician recommends that you have lab work today: BMP, CBC and BNP  Testing/Procedures: Your physician has requested that you have an echocardiogram. Echocardiography is a painless test that uses sound waves to create images of your heart. It provides your doctor with information about the size and shape of your heart and how well your heart's chambers and valves are working. This procedure takes approximately one hour. There are no restrictions for this procedure.  Follow-Up: Your physician recommends that you schedule a follow-up appointment with Dr Lovena Le or Chanetta Marshall NP in 2 WEEKS  Any Other Special Instructions Will Be Listed Below (If Applicable).  Drink plenty of water.

## 2015-08-13 DIAGNOSIS — B360 Pityriasis versicolor: Secondary | ICD-10-CM | POA: Diagnosis not present

## 2015-08-13 DIAGNOSIS — B351 Tinea unguium: Secondary | ICD-10-CM | POA: Diagnosis not present

## 2015-08-13 DIAGNOSIS — D225 Melanocytic nevi of trunk: Secondary | ICD-10-CM | POA: Diagnosis not present

## 2015-08-13 DIAGNOSIS — L570 Actinic keratosis: Secondary | ICD-10-CM | POA: Diagnosis not present

## 2015-08-13 DIAGNOSIS — L84 Corns and callosities: Secondary | ICD-10-CM | POA: Diagnosis not present

## 2015-08-14 ENCOUNTER — Encounter: Payer: Self-pay | Admitting: Cardiovascular Disease

## 2015-08-19 ENCOUNTER — Ambulatory Visit (HOSPITAL_COMMUNITY): Payer: Medicare PPO | Attending: Cardiology

## 2015-08-19 ENCOUNTER — Other Ambulatory Visit: Payer: Self-pay

## 2015-08-19 DIAGNOSIS — I358 Other nonrheumatic aortic valve disorders: Secondary | ICD-10-CM | POA: Insufficient documentation

## 2015-08-19 DIAGNOSIS — I4892 Unspecified atrial flutter: Secondary | ICD-10-CM | POA: Insufficient documentation

## 2015-08-19 DIAGNOSIS — I10 Essential (primary) hypertension: Secondary | ICD-10-CM

## 2015-08-19 DIAGNOSIS — I071 Rheumatic tricuspid insufficiency: Secondary | ICD-10-CM | POA: Insufficient documentation

## 2015-08-19 DIAGNOSIS — I351 Nonrheumatic aortic (valve) insufficiency: Secondary | ICD-10-CM | POA: Insufficient documentation

## 2015-08-19 DIAGNOSIS — I517 Cardiomegaly: Secondary | ICD-10-CM | POA: Diagnosis not present

## 2015-08-29 ENCOUNTER — Encounter: Payer: Self-pay | Admitting: Internal Medicine

## 2015-08-29 ENCOUNTER — Ambulatory Visit (INDEPENDENT_AMBULATORY_CARE_PROVIDER_SITE_OTHER): Payer: Medicare PPO | Admitting: Internal Medicine

## 2015-08-29 VITALS — BP 140/68 | HR 58 | Ht 68.0 in | Wt 167.0 lb

## 2015-08-29 DIAGNOSIS — I495 Sick sinus syndrome: Secondary | ICD-10-CM

## 2015-08-29 DIAGNOSIS — I4891 Unspecified atrial fibrillation: Secondary | ICD-10-CM

## 2015-08-29 DIAGNOSIS — I4892 Unspecified atrial flutter: Secondary | ICD-10-CM

## 2015-08-29 DIAGNOSIS — I10 Essential (primary) hypertension: Secondary | ICD-10-CM | POA: Diagnosis not present

## 2015-08-29 DIAGNOSIS — Z950 Presence of cardiac pacemaker: Secondary | ICD-10-CM

## 2015-08-29 DIAGNOSIS — E785 Hyperlipidemia, unspecified: Secondary | ICD-10-CM

## 2015-08-29 LAB — CUP PACEART INCLINIC DEVICE CHECK
Date Time Interrogation Session: 20160929093532
Lead Channel Impedance Value: 409 Ohm
Lead Channel Impedance Value: 429 Ohm
Lead Channel Pacing Threshold Amplitude: 0.6 V
Lead Channel Pacing Threshold Amplitude: 1.3 V
Lead Channel Pacing Threshold Pulse Width: 0.4 ms
Lead Channel Sensing Intrinsic Amplitude: 14.7 mV
Lead Channel Sensing Intrinsic Amplitude: 3.9 mV
Lead Channel Setting Pacing Amplitude: 1.8 V
Lead Channel Setting Pacing Pulse Width: 0.4 ms
MDC IDC MSMT LEADCHNL RA PACING THRESHOLD AMPLITUDE: 0.6 V
MDC IDC MSMT LEADCHNL RA PACING THRESHOLD PULSEWIDTH: 0.4 ms
MDC IDC MSMT LEADCHNL RA PACING THRESHOLD PULSEWIDTH: 0.4 ms
MDC IDC MSMT LEADCHNL RA SENSING INTR AMPL: 3.9 mV
MDC IDC MSMT LEADCHNL RV PACING THRESHOLD AMPLITUDE: 1.3 V
MDC IDC MSMT LEADCHNL RV PACING THRESHOLD PULSEWIDTH: 0.4 ms
MDC IDC SET LEADCHNL RA PACING AMPLITUDE: 1.1 V
Pulse Gen Model: 359524
Pulse Gen Serial Number: 66063261

## 2015-08-29 MED ORDER — RIVAROXABAN 20 MG PO TABS: 20.0000 mg | ORAL_TABLET | Freq: Every day | ORAL | Status: DC

## 2015-08-29 NOTE — Progress Notes (Signed)
HPI [name] returns today for followup. He is a very pleasant middle-age male sinus node dysfunction, status post permanent pacemaker insertion. He also is a history of hypertension which has been well-controlled.  He saw his primary MD yesterday and c/o atypical, non-exertional chest pain. He denies sob or syncope. His pain does not radiate. When pressed he tells me he aches all over. He also c/o fatigue and weakness. Since I last saw the patient, he has had a syncope episode and was noted to be in flutter during an echo. He has mild aortic stenosis/AI.  No Known Allergies   Current Outpatient Prescriptions  Medication Sig Dispense Refill  . benazepril (LOTENSIN) 5 MG tablet TAKE ONE TABLET BY MOUTH ONCE DAILY 90 tablet 3  . finasteride (PROSCAR) 5 MG tablet Take 1 tablet by mouth daily.    . fish oil-omega-3 fatty acids 1000 MG capsule Take 2 g by mouth daily.      . Multiple Vitamin (MULTIVITAMIN) tablet Take 1 tablet by mouth daily.      . naproxen (NAPROSYN) 500 MG tablet Take 500 mg by mouth daily as needed (pain).     Marland Kitchen omeprazole (PRILOSEC) 20 MG capsule TAKE ONE CAPSULE BY MOUTH DAILY. 90 capsule 0  . rivaroxaban (XARELTO) 20 MG TABS tablet Take 1 tablet (20 mg total) by mouth daily with supper. 30 tablet 11  . rosuvastatin (CRESTOR) 5 MG tablet Take 1 tablet (5 mg total) by mouth daily. 90 tablet 3  . tadalafil (CIALIS) 5 MG tablet Take 1 tablet (5 mg total) by mouth daily as needed for erectile dysfunction. 10 tablet 2  . tamsulosin (FLOMAX) 0.4 MG CAPS capsule Take 1 capsule (0.4 mg total) by mouth daily. 100 capsule 3  . zolpidem (AMBIEN) 5 MG tablet Take 1 tablet (5 mg total) by mouth at bedtime as needed for sleep. 90 tablet 1   No current facility-administered medications for this visit.     Past Medical History  Diagnosis Date  . HTN (hypertension)   . CVA (cerebral vascular accident)   . Ischemic optic neuropathy   . Other and unspecified hyperlipidemia   .  Bradycardia   . Abscess     right posterior neck  . Spinal stenosis   . GERD (gastroesophageal reflux disease)   . External hemorrhoids   . Elevated LFTs   . Sinoatrial node dysfunction     ROS:   All systems reviewed and negative except as noted in the HPI.   Past Surgical History  Procedure Laterality Date  . Cardiac catheterization  1996  . Cholecystectomy  02/07/2003  . Colonoscopy  06/23/2004  . Pacemaker insertion    . Eye surgery      left eye  . Abscess drainage      righ tposterior neck  . Left ingunial hernia  06/08/2011  . Right hip replacement      2012 Dr. Maureen Ralphs     Family History  Problem Relation Age of Onset  . Lung cancer Mother     mets to brain  . Heart attack Mother     45  . Hypertension Mother   . Heart attack Father     mid 63s, smoker. died 7.   . Diabetes Father   . Diabetes Paternal Grandfather      Social History   Social History  . Marital Status: Married    Spouse Name: N/A  . Number of Children: N/A  . Years of Education: N/A  Occupational History  . Not on file.   Social History Main Topics  . Smoking status: Former Smoker -- 1.00 packs/day for 20 years    Types: Cigarettes    Quit date: 11/04/1991  . Smokeless tobacco: Never Used  . Alcohol Use: 0.0 oz/week    0 Standard drinks or equivalent per week     Comment: occ  . Drug Use: No  . Sexual Activity: Not on file   Other Topics Concern  . Not on file   Social History Narrative   Married. 1 child and 1 step child. 2 grandchildren.       Lawyer (part time)      Hobbies: swimming (states not fun), travel     BP 140/68 mmHg  Pulse 58  Ht 5\' 8"  (1.727 m)  Wt 167 lb (75.751 kg)  BMI 25.40 kg/m2  SpO2 95%  Physical Exam:  Well appearing middle-aged man,NAD HEENT: Unremarkable Neck:  6 cm JVD, no thyromegally Lungs:  Clear with no wheezes, rales, or rhonchi. HEART:  Regular rate rhythm, no murmurs, no rubs, no clicks Abd:  soft, positive bowel  sounds, no organomegally, no rebound, no guarding Ext:  2 plus pulses, no edema, no cyanosis, no clubbing Skin:  No rashes no nodules Neuro:  CN II through XII intact, motor grossly intact  ECG - nsr with atrial pacing at 110.  DEVICE  Normal device function.  See PaceArt for details.   Assess/Plan:

## 2015-08-29 NOTE — Assessment & Plan Note (Signed)
Continue low dose crestor. Will follow.

## 2015-08-29 NOTE — Assessment & Plan Note (Signed)
His blood pressure is slightly elevated. He will reduce his sodium intake. He will continue his current meds.

## 2015-08-29 NOTE — Assessment & Plan Note (Signed)
His biotronik PPM is working normally. Will recheck in several months.

## 2015-08-29 NOTE — Patient Instructions (Addendum)
Medication Instructions:  Your physician recommends that you continue on your current medications as directed. Please refer to the Current Medication list given to you today.   Labwork: None ordered  Testing/Procedures: None ordered  Follow-Up: Your physician wants you to follow-up in: 6 months with Dr. Taylor.   You will receive a reminder letter in the mail two months in advance. If you don't receive a letter, please call our office to schedule the follow-up appointment.   Any Other Special Instructions Will Be Listed Below (If Applicable).   

## 2015-08-29 NOTE — Assessment & Plan Note (Signed)
He is mostly in NSR with atrial pacing. He will continue his anti-coagulation.

## 2015-10-17 ENCOUNTER — Encounter: Payer: Self-pay | Admitting: Internal Medicine

## 2015-11-01 ENCOUNTER — Other Ambulatory Visit: Payer: Self-pay

## 2015-11-01 MED ORDER — FINASTERIDE 5 MG PO TABS: 5.0000 mg | ORAL_TABLET | Freq: Every day | ORAL | Status: DC

## 2015-11-18 ENCOUNTER — Ambulatory Visit (INDEPENDENT_AMBULATORY_CARE_PROVIDER_SITE_OTHER): Payer: Medicare PPO | Admitting: Family Medicine

## 2015-11-18 ENCOUNTER — Encounter: Payer: Self-pay | Admitting: Family Medicine

## 2015-11-18 ENCOUNTER — Ambulatory Visit: Payer: Medicare PPO | Admitting: Family Medicine

## 2015-11-18 VITALS — BP 118/70 | HR 78 | Temp 97.7°F | Wt 168.0 lb

## 2015-11-18 DIAGNOSIS — H6983 Other specified disorders of Eustachian tube, bilateral: Secondary | ICD-10-CM | POA: Diagnosis not present

## 2015-11-18 DIAGNOSIS — J3089 Other allergic rhinitis: Secondary | ICD-10-CM

## 2015-11-18 DIAGNOSIS — R438 Other disturbances of smell and taste: Secondary | ICD-10-CM | POA: Diagnosis not present

## 2015-11-18 DIAGNOSIS — R195 Other fecal abnormalities: Secondary | ICD-10-CM

## 2015-11-18 NOTE — Patient Instructions (Signed)
Trial of flonase and zyrtec combination over the next month  If need be, use zantac instead of prilosec  If metallic taste or ear issues persist- refer to ENT within a month

## 2015-11-18 NOTE — Progress Notes (Signed)
Garret Reddish, MD  Subjective:  [name] is a 69 y.o. year old very pleasant male patient who presents for/with See problem oriented charting ROS- no headache, blurry vision. No brbpr or melena. No nausea/vomiting.   Past Medical History-  Patient Active Problem List   Diagnosis Date Noted  . CVA (cerebral infarction) 01/30/2015    Priority: High  . Atrial fibrillation and flutter ([place]) 01/01/2015    Priority: High  . Ventricular tachycardia, non-sustained ([place]) 01/25/2013    Priority: High  . Cardiac pacemaker in situ 12/28/2009    Priority: High  . BPH associated with nocturia 08/14/2014    Priority: Medium  . Hyperlipidemia 07/12/2007    Priority: Medium  . NEUROPATHY, ISCHEMIC OPTIC 06/02/2007    Priority: Medium  . Essential hypertension 06/02/2007    Priority: Medium  . Left chest pressure 03/19/2015    Priority: Low  . Insomnia 01/30/2015    Priority: Low  . Erectile dysfunction 01/30/2015    Priority: Low  . Former smoker 01/30/2015    Priority: Low  . Allergic rhinitis 10/11/2014    Priority: Low  . Sinoatrial node dysfunction (HCC)     Priority: Low  . SPINAL STENOSIS, LUMBAR 08/22/2010    Priority: Low  . GERD 03/17/2010    Priority: Low    Medications- reviewed and updated Current Outpatient Prescriptions  Medication Sig Dispense Refill  . benazepril (LOTENSIN) 5 MG tablet TAKE ONE TABLET BY MOUTH ONCE DAILY 90 tablet 3  . finasteride (PROSCAR) 5 MG tablet Take 1 tablet (5 mg total) by mouth daily. 90 tablet 2  . fish oil-omega-3 fatty acids 1000 MG capsule Take 2 g by mouth daily.      . Multiple Vitamin (MULTIVITAMIN) tablet Take 1 tablet by mouth daily.      . naproxen (NAPROSYN) 500 MG tablet Take 500 mg by mouth daily as needed (pain).     Marland Kitchen omeprazole (PRILOSEC) 20 MG capsule TAKE ONE CAPSULE BY MOUTH DAILY. 90 capsule 0  . rivaroxaban (XARELTO) 20 MG TABS tablet Take 1 tablet (20 mg total) by mouth daily with supper. 90 tablet 3  .  rosuvastatin (CRESTOR) 5 MG tablet Take 1 tablet (5 mg total) by mouth daily. 90 tablet 3  . tamsulosin (FLOMAX) 0.4 MG CAPS capsule Take 1 capsule (0.4 mg total) by mouth daily. 100 capsule 3  . tadalafil (CIALIS) 5 MG tablet Take 1 tablet (5 mg total) by mouth daily as needed for erectile dysfunction. (Patient not taking: Reported on 11/18/2015) 10 tablet 2  . zolpidem (AMBIEN) 5 MG tablet Take 1 tablet (5 mg total) by mouth at bedtime as needed for sleep. (Patient not taking: Reported on 11/18/2015) 90 tablet 1   No current facility-administered medications for this visit.    Objective: BP 118/70 mmHg  Pulse 78  Temp(Src) 97.7 F (36.5 C)  Wt 168 lb (76.204 kg) Gen: NAD, resting comfortably mild increased erythema bilateral TM, oropharynx normal, no wax and normal ear canals CV: RRR no murmurs rubs or gallops Lungs: CTAB no crackles, wheeze, rhonchi Abdomen: soft/nontender/nondistended/normal bowel sounds. No rebound or guarding.  Ext: no edema Skin: warm, dry Neuro: grossly normal, moves all extremities, normal gait  Assessment/Plan:  Ears plugged/popping/discomfort Metallic taste in mouth Both in patient with history allergic rhinitis currently untreated S:For last month metallic taste in mouth. Denies change in medicine. Eats balanced diet. Takes multivitamin. Only other changes he has noted no history of trauma or recent infection. Hearing has  worsened recently (not great at baseline). Constantly clearing ears- will clog up/ooze if lays on one side. Memory loss disease related concern with this. Trouble with names but no recent worsening and no other areas of memory loss. High functioning as Chief Executive Officer. Hopeful for sinus infection but denies significant sinus pressure. Some mild congestion.  A/P: reviewed all medicines- ace inhibitor could predispose to zinc deficiency which could cause similar complaints. Considered checking b12 and zinc level. Patient with untreated allergic  rhinitis though so we will trial  Zyrtec along with Flonase trial for 1 month. Wonder if better control of this could improve olfaction and olfaction in turn improve taste. Also likely to help with presumed eustachian tube dysfunction. Patient will call me and can refer to ENT if does not resolve within this time frame. Could do labs before referral if patient prefers. Discussed if memory issues worsen- would do MMSE  GI complaints- GERD/indigestion when not taking PPI and loose stools since antibiotics S:Loose stools since he took an antibiotic about 4 months ago. Not multiple stools per day. Was never consistent with probiotics. Also experiencing Indigestion- every meal unless he takes prilosec but with CVA history he is now concerned.  A/P: discussed trial of zantac instead of PPI. Also discussed a more consistent trial with probiotics. No red flags like blood in stool to suggest   Return precautions advised.

## 2015-12-01 DIAGNOSIS — I5022 Chronic systolic (congestive) heart failure: Secondary | ICD-10-CM | POA: Insufficient documentation

## 2015-12-01 DIAGNOSIS — R0789 Other chest pain: Secondary | ICD-10-CM | POA: Insufficient documentation

## 2015-12-01 HISTORY — DX: Chronic systolic (congestive) heart failure: I50.22

## 2015-12-01 HISTORY — DX: Other chest pain: R07.89

## 2015-12-03 ENCOUNTER — Other Ambulatory Visit: Payer: Self-pay | Admitting: Family Medicine

## 2015-12-03 DIAGNOSIS — H9203 Otalgia, bilateral: Secondary | ICD-10-CM

## 2016-01-07 ENCOUNTER — Other Ambulatory Visit: Payer: Self-pay | Admitting: Family Medicine

## 2016-01-08 ENCOUNTER — Other Ambulatory Visit: Payer: Self-pay | Admitting: Family Medicine

## 2016-01-08 NOTE — Telephone Encounter (Signed)
Patient would like a 90 day supply for Cialis or a 30 day supply at least.  He says the prescription was only written for 10 days.

## 2016-01-09 ENCOUNTER — Encounter: Payer: Self-pay | Admitting: Internal Medicine

## 2016-01-09 ENCOUNTER — Ambulatory Visit (INDEPENDENT_AMBULATORY_CARE_PROVIDER_SITE_OTHER): Payer: Medicare Other | Admitting: Internal Medicine

## 2016-01-09 VITALS — BP 120/72 | HR 70 | Ht 68.0 in | Wt 166.8 lb

## 2016-01-09 DIAGNOSIS — I4892 Unspecified atrial flutter: Secondary | ICD-10-CM

## 2016-01-09 DIAGNOSIS — I4891 Unspecified atrial fibrillation: Secondary | ICD-10-CM

## 2016-01-09 DIAGNOSIS — R0789 Other chest pain: Secondary | ICD-10-CM

## 2016-01-09 DIAGNOSIS — I495 Sick sinus syndrome: Secondary | ICD-10-CM

## 2016-01-09 LAB — CUP PACEART INCLINIC DEVICE CHECK
Date Time Interrogation Session: 20170209135219
Implantable Lead Location: 753859
Implantable Lead Model: 5076
Lead Channel Impedance Value: 448 Ohm
Lead Channel Impedance Value: 448 Ohm
Lead Channel Pacing Threshold Amplitude: 0.9 V
Lead Channel Pacing Threshold Pulse Width: 0.4 ms
Lead Channel Sensing Intrinsic Amplitude: 17.4 mV
Lead Channel Setting Pacing Amplitude: 1.1 V
MDC IDC LEAD IMPLANT DT: 20031017
MDC IDC LEAD IMPLANT DT: 20031017
MDC IDC LEAD LOCATION: 753860
MDC IDC MSMT BATTERY REMAINING LONGEVITY: 99 mo
MDC IDC MSMT LEADCHNL RA PACING THRESHOLD AMPLITUDE: 0.5 V
MDC IDC MSMT LEADCHNL RA PACING THRESHOLD PULSEWIDTH: 0.4 ms
MDC IDC MSMT LEADCHNL RA SENSING INTR AMPL: 4.3 mV
MDC IDC SET LEADCHNL RV PACING AMPLITUDE: 1.8 V
MDC IDC SET LEADCHNL RV PACING PULSEWIDTH: 0.4 ms
MDC IDC STAT BRADY RA PERCENT PACED: 99 %
MDC IDC STAT BRADY RV PERCENT PACED: 0 %
Pulse Gen Serial Number: 66063261

## 2016-01-09 NOTE — Patient Instructions (Signed)
Medication Instructions:  Your physician recommends that you continue on your current medications as directed. Please refer to the Current Medication list given to you today.   Labwork: None ordered   Testing/Procedures: Your physician has requested that you have en exercise stress myoview. For further information please visit [email or web address]. Please follow instruction sheet, as given.    Follow-Up: Your physician wants you to follow-up in: 6 months with device clinic and 12 months with Dr Knox Saliva will receive a reminder letter in the mail two months in advance. If you don't receive a letter, please call our office to schedule the follow-up appointment.   Any Other Special Instructions Will Be Listed Below (If Applicable).     If you need a refill on your cardiac medications before your next appointment, please call your pharmacy.

## 2016-01-09 NOTE — Telephone Encounter (Signed)
You can send in 90 day supply. Usually insurance will only cover lower volumes though. I would inform him that we sent in #90 but insurance may restrict

## 2016-01-09 NOTE — Progress Notes (Signed)
HPI [name] returns today for followup. He is a very pleasant 70 yo man with sinus node dysfunction, status post permanent pacemaker insertion. He also is a history of hypertension which has been well-controlled.  He has recently been bothered by worsening chest pain at times with exertion and at times at rest. He denies sob or syncope. His pain radiates to his arms. Since I last saw the patient, he has had no palpitations. No Known Allergies   Current Outpatient Prescriptions  Medication Sig Dispense Refill  . benazepril (LOTENSIN) 5 MG tablet TAKE ONE TABLET BY MOUTH ONCE DAILY 90 tablet 3  . CIALIS 5 MG tablet TAKE 1 TABLET BY MOUTH EVERY DAY AS NEEDED FOR erectile dysfunction. 10 tablet 0  . finasteride (PROSCAR) 5 MG tablet Take 1 tablet (5 mg total) by mouth daily. 90 tablet 2  . fish oil-omega-3 fatty acids 1000 MG capsule Take 2 g by mouth daily.      . Multiple Vitamin (MULTIVITAMIN) tablet Take 1 tablet by mouth daily.      . naproxen (NAPROSYN) 500 MG tablet Take 500 mg by mouth daily as needed (pain).     Marland Kitchen omeprazole (PRILOSEC) 20 MG capsule Take 20 mg by mouth daily as needed (heartburn or acid reflux).    . rivaroxaban (XARELTO) 20 MG TABS tablet Take 1 tablet (20 mg total) by mouth daily with supper. 90 tablet 3  . rosuvastatin (CRESTOR) 5 MG tablet Take 1 tablet (5 mg total) by mouth daily. 90 tablet 3  . tamsulosin (FLOMAX) 0.4 MG CAPS capsule Take 1 capsule (0.4 mg total) by mouth daily. 100 capsule 3  . zolpidem (AMBIEN) 5 MG tablet Take 1 tablet (5 mg total) by mouth at bedtime as needed for sleep. 90 tablet 1   No current facility-administered medications for this visit.     Past Medical History  Diagnosis Date  . HTN (hypertension)   . CVA (cerebral vascular accident) ([place])   . Ischemic optic neuropathy   . Other and unspecified hyperlipidemia   . Bradycardia   . Abscess     right posterior neck  . Spinal stenosis   . GERD (gastroesophageal reflux  disease)   . External hemorrhoids   . Elevated LFTs   . Sinoatrial node dysfunction (HCC)     ROS:   All systems reviewed and negative except as noted in the HPI.   Past Surgical History  Procedure Laterality Date  . Cardiac catheterization  1996  . Cholecystectomy  02/07/2003  . Colonoscopy  06/23/2004  . Pacemaker insertion    . Eye surgery      left eye  . Abscess drainage      righ tposterior neck  . Left ingunial hernia  06/08/2011  . Right hip replacement      2012 Dr. Maureen Ralphs     Family History  Problem Relation Age of Onset  . Lung cancer Mother     mets to brain  . Heart attack Mother     64  . Hypertension Mother   . Heart attack Father     mid 16s, smoker. died 20.   . Diabetes Father   . Diabetes Paternal Grandfather      Social History   Social History  . Marital Status: Married    Spouse Name: N/A  . Number of Children: N/A  . Years of Education: N/A   Occupational History  . Not on file.   Social History Main Topics  .  Smoking status: Former Smoker -- 1.00 packs/day for 20 years    Types: Cigarettes    Quit date: 11/04/1991  . Smokeless tobacco: Never Used  . Alcohol Use: 0.0 oz/week    0 Standard drinks or equivalent per week     Comment: occ  . Drug Use: No  . Sexual Activity: Not on file   Other Topics Concern  . Not on file   Social History Narrative   Married. 1 child and 1 step child. 2 grandchildren.       Lawyer (part time)      Hobbies: swimming (states not fun), travel     BP 120/72 mmHg  Pulse 70  Ht 5\' 8"  (1.727 m)  Wt 166 lb 12.8 oz (75.66 kg)  BMI 25.37 kg/m2  Physical Exam:  Well appearing middle-aged man,NAD HEENT: Unremarkable Neck:  6 cm JVD, no thyromegally Lungs:  Clear with no wheezes, rales, or rhonchi. HEART:  Regular rate rhythm, no murmurs, no rubs, no clicks Abd:  soft, positive bowel sounds, no organomegally, no rebound, no guarding Ext:  2 plus pulses, no edema, no cyanosis, no  clubbing Skin:  No rashes no nodules Neuro:  CN II through XII intact, motor grossly intact  ECG - nsr with atrial pacing at 70/min. Non-specific STT changes  DEVICE  Normal device function.  See PaceArt for details.   A/P 1. Chest pain - we discussed the treatment options. He has had a stroke 20 years ago during heart cath. I have recommended her undergo exercise myoview. If positive, he will need cardiac cath. 2. Sinus node dysfunction - he has a resting HR less than 30/min in the absence of pacing 3. PAF - he is maintaining NSR 4. PM - his Biotronik device is working normally.    ,M.D.  Assess/Plan:

## 2016-01-10 MED ORDER — TADALAFIL 5 MG PO TABS: ORAL_TABLET | ORAL | Status: DC

## 2016-01-10 NOTE — Telephone Encounter (Signed)
Left a message for the pt to return my call.  

## 2016-01-10 NOTE — Telephone Encounter (Signed)
I called the pt and informed him of the message below and the Rx was sent to his pharmacy.

## 2016-01-14 ENCOUNTER — Other Ambulatory Visit: Payer: Self-pay

## 2016-01-20 ENCOUNTER — Telehealth: Payer: Self-pay

## 2016-01-20 NOTE — Telephone Encounter (Signed)
Prior auth for Xarelto 20mg sent to [facility]. 

## 2016-01-21 ENCOUNTER — Telehealth (HOSPITAL_COMMUNITY): Payer: Self-pay | Admitting: *Deleted

## 2016-01-21 ENCOUNTER — Telehealth: Payer: Self-pay

## 2016-01-21 NOTE — Telephone Encounter (Signed)
Xarelto approved through 12/321/2017. PA [id number].

## 2016-01-21 NOTE — Telephone Encounter (Signed)
Patient given detailed instructions per Myocardial Perfusion Study Information Sheet for the test on 01/23/16 at 945. Patient notified to arrive 15 minutes early and that it is imperative to arrive on time for appointment to keep from having the test rescheduled.  If you need to cancel or reschedule your appointment, please call the office within 24 hours of your appointment. Failure to do so may result in a cancellation of your appointment, and a $50 no show fee. Patient verbalized understanding.Hubbard Robinson, RN

## 2016-01-22 ENCOUNTER — Encounter: Payer: Self-pay | Admitting: Cardiology

## 2016-01-22 ENCOUNTER — Encounter: Payer: Self-pay | Admitting: Internal Medicine

## 2016-01-23 ENCOUNTER — Encounter (HOSPITAL_COMMUNITY): Payer: Medicare Other

## 2016-01-29 ENCOUNTER — Telehealth (HOSPITAL_COMMUNITY): Payer: Self-pay | Admitting: *Deleted

## 2016-01-29 NOTE — Telephone Encounter (Signed)
Left message on voicemail in reference to upcoming appointment scheduled for 02/03/16. Phone number given for a call back so details instructions can be given. Hubbard Robinson, RN

## 2016-01-30 ENCOUNTER — Telehealth (HOSPITAL_COMMUNITY): Payer: Self-pay

## 2016-01-30 NOTE — Telephone Encounter (Signed)
Patient given detailed instructions per Myocardial Perfusion Study Information Sheet for the test on 02/03/2016 at 7:30. Patient notified to arrive 15 minutes early and that it is imperative to arrive on time for appointment to keep from having the test rescheduled.  If you need to cancel or reschedule your appointment, please call the office within 24 hours of your appointment. Failure to do so may result in a cancellation of your appointment, and a $50 no show fee. Patient verbalized understanding.EHK

## 2016-02-03 ENCOUNTER — Ambulatory Visit (HOSPITAL_COMMUNITY): Payer: Medicare Other | Attending: Cardiovascular Disease

## 2016-02-03 DIAGNOSIS — R0789 Other chest pain: Secondary | ICD-10-CM | POA: Diagnosis not present

## 2016-02-03 DIAGNOSIS — R9439 Abnormal result of other cardiovascular function study: Secondary | ICD-10-CM | POA: Insufficient documentation

## 2016-02-03 DIAGNOSIS — I10 Essential (primary) hypertension: Secondary | ICD-10-CM | POA: Insufficient documentation

## 2016-02-03 LAB — MYOCARDIAL PERFUSION IMAGING
Estimated workload: 8.5 METS
Exercise duration (min): 7 min
Exercise duration (sec): 2 s
LV dias vol: 122 mL
LV sys vol: 74 mL
MPHR: 151 {beats}/min
Peak HR: 142 {beats}/min
Percent HR: 94 %
Percent of predicted max HR: 94 %
RATE: 0.29
RPE: 18
Rest HR: 67 {beats}/min
SDS: 2
SRS: 2
SSS: 4
Stage 1 DBP: 77 mmHg
Stage 1 Grade: 0 %
Stage 1 HR: 66 {beats}/min
Stage 1 SBP: 144 mmHg
Stage 1 Speed: 0 mph
Stage 2 Grade: 0 %
Stage 2 HR: 66 {beats}/min
Stage 2 Speed: 0 mph
Stage 3 Grade: 10 %
Stage 3 HR: 104 {beats}/min
Stage 3 Speed: 1.7 mph
Stage 4 DBP: 88 mmHg
Stage 4 Grade: 12 %
Stage 4 HR: 139 {beats}/min
Stage 4 SBP: 171 mmHg
Stage 4 Speed: 2.5 mph
Stage 5 Grade: 14 %
Stage 5 HR: 142 {beats}/min
Stage 5 Speed: 3.4 mph
Stage 6 Grade: 14.7 %
Stage 6 HR: 142 {beats}/min
Stage 6 Speed: 3.7 mph
Stage 7 DBP: 76 mmHg
Stage 7 Grade: 0 %
Stage 7 HR: 139 {beats}/min
Stage 7 SBP: 169 mmHg
Stage 7 Speed: 0 mph
Stage 8 DBP: 83 mmHg
Stage 8 Grade: 0 %
Stage 8 HR: 80 {beats}/min
Stage 8 SBP: 145 mmHg
Stage 8 Speed: 0 mph
TID: 1.15

## 2016-02-03 MED ORDER — TECHNETIUM TC 99M SESTAMIBI GENERIC - CARDIOLITE
10.3000 | Freq: Once | INTRAVENOUS | Status: AC | PRN
  Administered 2016-02-03: 10 via INTRAVENOUS

## 2016-02-03 MED ORDER — TECHNETIUM TC 99M SESTAMIBI GENERIC - CARDIOLITE
31.7000 | Freq: Once | INTRAVENOUS | Status: AC | PRN
  Administered 2016-02-03: 31.7 via INTRAVENOUS

## 2016-02-04 ENCOUNTER — Telehealth: Payer: Self-pay | Admitting: Internal Medicine

## 2016-02-04 NOTE — Telephone Encounter (Signed)
Follow UP   Pt returned call. States that he was offered a sooner appt to see Dr. Lovena Le to discuss results. Please call back to discuss further

## 2016-02-04 NOTE — Telephone Encounter (Signed)
Spoke with patient and scheduled him to come in and discuss his stress test with Dr Lovena Le on 02/06/16 at 3:30pm

## 2016-02-06 ENCOUNTER — Encounter: Payer: Self-pay | Admitting: Internal Medicine

## 2016-02-06 ENCOUNTER — Ambulatory Visit (INDEPENDENT_AMBULATORY_CARE_PROVIDER_SITE_OTHER): Payer: Medicare Other | Admitting: Internal Medicine

## 2016-02-06 VITALS — BP 128/60 | HR 76 | Ht 68.0 in | Wt 166.4 lb

## 2016-02-06 DIAGNOSIS — R9439 Abnormal result of other cardiovascular function study: Secondary | ICD-10-CM | POA: Diagnosis not present

## 2016-02-06 NOTE — Patient Instructions (Addendum)
Medication Instructions:  .isntm   Labwork: Your physician recommends that you return for lab work today: BMP   Testing/Procedures:  Your physician has requested that you have cardiac CT-Angiogram  Cardiac computed tomography (CT) is a painless test that uses an x-ray machine to take clear, detailed pictures of your heart. For further information please visit [email or web address]. Please follow instruction sheet as given.         Follow-Up: Your physician recommends that you schedule a follow-up appointment in: 2 weeks with Dillon Bjork, PA  Post angiogram   Any Other Special Instructions Will Be Listed Below (If Applicable).     If you need a refill on your cardiac medications before your next appointment, please call your pharmacy.

## 2016-02-07 NOTE — Progress Notes (Signed)
HPI [name] returns today for followup. He is a very pleasant 70 yo man with sinus node dysfunction, status post permanent pacemaker insertion. He also is a history of hypertension which has been well-controlled.  He has recently been bothered by worsening chest pain at times with exertion and at times at rest. He denies sob or syncope. His pain radiates to his arms. Since I last saw the patient, he has undergone stress myoview study which demonstrated moderate LV dysfunction and a mild perfusion defect.  No Known Allergies   Current Outpatient Prescriptions  Medication Sig Dispense Refill  . benazepril (LOTENSIN) 5 MG tablet TAKE ONE TABLET BY MOUTH ONCE DAILY 90 tablet 3  . finasteride (PROSCAR) 5 MG tablet Take 1 tablet (5 mg total) by mouth daily. 90 tablet 2  . fish oil-omega-3 fatty acids 1000 MG capsule Take 2 g by mouth daily.      . Multiple Vitamin (MULTIVITAMIN) tablet Take 1 tablet by mouth daily.      . naproxen (NAPROSYN) 500 MG tablet Take 500 mg by mouth daily as needed (pain).     Marland Kitchen omeprazole (PRILOSEC) 20 MG capsule Take 20 mg by mouth daily as needed (heartburn or acid reflux).    . rivaroxaban (XARELTO) 20 MG TABS tablet Take 1 tablet (20 mg total) by mouth daily with supper. 90 tablet 3  . rosuvastatin (CRESTOR) 5 MG tablet Take 1 tablet (5 mg total) by mouth daily. 90 tablet 3  . tadalafil (CIALIS) 5 MG tablet TAKE 1 TABLET BY MOUTH EVERY DAY AS NEEDED FOR erectile dysfunction. 90 tablet 0  . tamsulosin (FLOMAX) 0.4 MG CAPS capsule Take 1 capsule (0.4 mg total) by mouth daily. 100 capsule 3  . zolpidem (AMBIEN) 5 MG tablet Take 1 tablet (5 mg total) by mouth at bedtime as needed for sleep. 90 tablet 1   No current facility-administered medications for this visit.     Past Medical History  Diagnosis Date  . HTN (hypertension)   . CVA (cerebral vascular accident) ([place])   . Ischemic optic neuropathy   . Other and unspecified hyperlipidemia   . Bradycardia   .  Abscess     right posterior neck  . Spinal stenosis   . GERD (gastroesophageal reflux disease)   . External hemorrhoids   . Elevated LFTs   . Sinoatrial node dysfunction (HCC)     ROS:   All systems reviewed and negative except as noted in the HPI.   Past Surgical History  Procedure Laterality Date  . Cardiac catheterization  1996  . Cholecystectomy  02/07/2003  . Colonoscopy  06/23/2004  . Pacemaker insertion    . Eye surgery      left eye  . Abscess drainage      righ tposterior neck  . Left ingunial hernia  06/08/2011  . Right hip replacement      2012 Dr. Maureen Ralphs     Family History  Problem Relation Age of Onset  . Lung cancer Mother     mets to brain  . Heart attack Mother     59  . Hypertension Mother   . Heart attack Father     mid 40s, smoker. died 104.   . Diabetes Father   . Diabetes Paternal Grandfather      Social History   Social History  . Marital Status: Married    Spouse Name: N/A  . Number of Children: N/A  . Years of Education: N/A   Occupational  History  . Not on file.   Social History Main Topics  . Smoking status: Former Smoker -- 1.00 packs/day for 20 years    Types: Cigarettes    Quit date: 11/04/1991  . Smokeless tobacco: Never Used  . Alcohol Use: 0.0 oz/week    0 Standard drinks or equivalent per week     Comment: occ  . Drug Use: No  . Sexual Activity: Not on file   Other Topics Concern  . Not on file   Social History Narrative   Married. 1 child and 1 step child. 2 grandchildren.       Lawyer (part time)      Hobbies: swimming (states not fun), travel     BP 128/60 mmHg  Pulse 76  Ht 5\' 8"  (1.727 m)  Wt 166 lb 6.4 oz (75.479 kg)  BMI 25.31 kg/m2  Physical Exam:  Well appearing middle-aged man,NAD HEENT: Unremarkable Neck:  6 cm JVD, no thyromegally Lungs:  Clear with no wheezes, rales, or rhonchi. HEART:  Regular rate rhythm, no murmurs, no rubs, no clicks Abd:  soft, positive bowel sounds, no  organomegally, no rebound, no guarding Ext:  2 plus pulses, no edema, no cyanosis, no clubbing Skin:  No rashes no nodules Neuro:  CN II through XII intact, motor grossly intact  A/P 1. Chest pain - we discussed the treatment options. He has had a stroke 20 years ago during heart cath. His stress test is abnormal and moderate risk. I will have him undergo coronary CT scanning. If he has a significant blockage, then we will need to repeat his cath 2. PAF - he is maintaining NSR. He will continue his anticoagulation.  [name].D.

## 2016-02-11 ENCOUNTER — Encounter: Payer: Self-pay | Admitting: Internal Medicine

## 2016-02-11 ENCOUNTER — Other Ambulatory Visit (INDEPENDENT_AMBULATORY_CARE_PROVIDER_SITE_OTHER): Payer: Medicare Other | Admitting: *Deleted

## 2016-02-11 DIAGNOSIS — R9439 Abnormal result of other cardiovascular function study: Secondary | ICD-10-CM | POA: Diagnosis not present

## 2016-02-11 LAB — BASIC METABOLIC PANEL
BUN: 16 mg/dL (ref 7–25)
CALCIUM: 9.7 mg/dL (ref 8.6–10.3)
CO2: 27 mmol/L (ref 20–31)
Chloride: 102 mmol/L (ref 98–110)
Creat: 1.16 mg/dL (ref 0.70–1.25)
Glucose, Bld: 104 mg/dL — ABNORMAL HIGH (ref 65–99)
Potassium: 4.1 mmol/L (ref 3.5–5.3)
SODIUM: 140 mmol/L (ref 135–146)

## 2016-02-11 NOTE — Addendum Note (Signed)
Addended by: Eulis Foster on: 02/11/2016 02:49 PM   Modules accepted: Orders

## 2016-02-13 ENCOUNTER — Encounter: Payer: Self-pay | Admitting: Physician Assistant

## 2016-02-18 ENCOUNTER — Ambulatory Visit (HOSPITAL_COMMUNITY)
Admission: RE | Admit: 2016-02-18 | Discharge: 2016-02-18 | Disposition: A | Discharge status: Home / Self Care | Payer: Medicare Other | Source: Ambulatory Visit | Attending: Internal Medicine | Admitting: Internal Medicine

## 2016-02-18 ENCOUNTER — Encounter (HOSPITAL_COMMUNITY): Payer: Self-pay

## 2016-02-18 DIAGNOSIS — R9439 Abnormal result of other cardiovascular function study: Secondary | ICD-10-CM | POA: Insufficient documentation

## 2016-02-18 DIAGNOSIS — R911 Solitary pulmonary nodule: Secondary | ICD-10-CM | POA: Insufficient documentation

## 2016-02-18 DIAGNOSIS — Q231 Congenital insufficiency of aortic valve: Secondary | ICD-10-CM | POA: Insufficient documentation

## 2016-02-18 DIAGNOSIS — I2510 Atherosclerotic heart disease of native coronary artery without angina pectoris: Secondary | ICD-10-CM | POA: Insufficient documentation

## 2016-02-18 MED ORDER — IOHEXOL 350 MG/ML SOLN
80.0000 mL | Freq: Once | INTRAVENOUS | Status: AC | PRN
Start: 2016-02-18 — End: 2016-02-18
  Administered 2016-02-18: 80 mL via INTRAVENOUS

## 2016-02-18 MED ORDER — NITROGLYCERIN 0.4 MG SL SUBL
SUBLINGUAL_TABLET | SUBLINGUAL | Status: AC
Start: 2016-02-18 — End: 2016-02-18
  Filled 2016-02-18: qty 2

## 2016-02-18 MED ORDER — NITROGLYCERIN 0.4 MG SL SUBL
0.8000 mg | SUBLINGUAL_TABLET | Freq: Once | SUBLINGUAL | Status: AC
  Administered 2016-02-18: 0.8 mg via SUBLINGUAL

## 2016-02-18 MED ORDER — METOPROLOL TARTRATE 1 MG/ML IV SOLN
5.0000 mg | Freq: Once | INTRAVENOUS | Status: AC
  Administered 2016-02-18: 5 mg via INTRAVENOUS

## 2016-02-18 MED ORDER — METOPROLOL TARTRATE 1 MG/ML IV SOLN
5.0000 mg | Freq: Once | INTRAVENOUS | Status: AC
Start: 2016-02-18 — End: 2016-02-18
  Administered 2016-02-18: 5 mg via INTRAVENOUS

## 2016-02-18 MED ORDER — METOPROLOL TARTRATE 1 MG/ML IV SOLN
INTRAVENOUS | Status: AC
  Filled 2016-02-18: qty 5

## 2016-02-18 NOTE — Progress Notes (Signed)
CT scan completed. Tolerated well. D/C home walking, awake and alert. In no distress. 

## 2016-03-04 ENCOUNTER — Encounter: Payer: Self-pay | Admitting: Internal Medicine

## 2016-03-04 ENCOUNTER — Ambulatory Visit (INDEPENDENT_AMBULATORY_CARE_PROVIDER_SITE_OTHER): Payer: Medicare Other | Admitting: Internal Medicine

## 2016-03-04 VITALS — BP 122/76 | HR 70 | Ht 68.0 in | Wt 196.6 lb

## 2016-03-04 DIAGNOSIS — I255 Ischemic cardiomyopathy: Secondary | ICD-10-CM

## 2016-03-04 MED ORDER — RANITIDINE HCL 150 MG PO TABS: 150.0000 mg | ORAL_TABLET | Freq: Two times a day (BID) | ORAL | Status: DC

## 2016-03-04 MED ORDER — METOPROLOL TARTRATE 25 MG PO TABS: 12.5000 mg | ORAL_TABLET | Freq: Two times a day (BID) | ORAL | Status: DC

## 2016-03-04 MED ORDER — ROSUVASTATIN CALCIUM 20 MG PO TABS: 20.0000 mg | ORAL_TABLET | ORAL | Status: DC

## 2016-03-04 NOTE — Progress Notes (Signed)
HPI Mr. Salz returns today for followup. He is a very pleasant 70 yo man with sinus node dysfunction, status post permanent pacemaker insertion. He also is a history of hypertension which has been well-controlled.  He has recently been bothered by worsening chest pain at times with exertion and at times at rest. He denies sob or syncope. His pain radiates to his arms. Since I last saw the patient, he has undergone stress myoview study which demonstrated moderate LV dysfunction and a mild perfusion defect. In addition, he has undergone a CT of the coronary arteries and which demonstrated moderate disease but no tight stenosis.  No Known Allergies   Current Outpatient Prescriptions  Medication Sig Dispense Refill  . benazepril (LOTENSIN) 5 MG tablet TAKE ONE TABLET BY MOUTH ONCE DAILY 90 tablet 3  . finasteride (PROSCAR) 5 MG tablet Take 1 tablet (5 mg total) by mouth daily. 90 tablet 2  . fish oil-omega-3 fatty acids 1000 MG capsule Take 2 g by mouth daily. Reported on 02/18/2016    . Multiple Vitamin (MULTIVITAMIN) tablet Take 1 tablet by mouth daily.      . naproxen (NAPROSYN) 500 MG tablet Take 500 mg by mouth daily as needed (pain).     Marland Kitchen omeprazole (PRILOSEC) 20 MG capsule Take 20 mg by mouth daily as needed (heartburn or acid reflux). Reported on 02/18/2016    . rivaroxaban (XARELTO) 20 MG TABS tablet Take 1 tablet (20 mg total) by mouth daily with supper. 90 tablet 3  . rosuvastatin (CRESTOR) 5 MG tablet Take 1 tablet (5 mg total) by mouth daily. 90 tablet 3  . tadalafil (CIALIS) 5 MG tablet TAKE 1 TABLET BY MOUTH EVERY DAY AS NEEDED FOR erectile dysfunction. 90 tablet 0  . tamsulosin (FLOMAX) 0.4 MG CAPS capsule Take 1 capsule (0.4 mg total) by mouth daily. 100 capsule 3  . zolpidem (AMBIEN) 5 MG tablet Take 1 tablet (5 mg total) by mouth at bedtime as needed for sleep. 90 tablet 1   No current facility-administered medications for this visit.     Past Medical History  Diagnosis  Date  . HTN (hypertension)   . CVA (cerebral vascular accident) ([place])   . Ischemic optic neuropathy   . Other and unspecified hyperlipidemia   . Bradycardia   . Abscess     right posterior neck  . Spinal stenosis   . GERD (gastroesophageal reflux disease)   . External hemorrhoids   . Elevated LFTs   . Sinoatrial node dysfunction (HCC)     ROS:   All systems reviewed and negative except as noted in the HPI.   Past Surgical History  Procedure Laterality Date  . Cardiac catheterization  1996  . Cholecystectomy  02/07/2003  . Colonoscopy  06/23/2004  . Pacemaker insertion    . Eye surgery      left eye  . Abscess drainage      righ tposterior neck  . Left ingunial hernia  06/08/2011  . Right hip replacement      2012 Dr. Maureen Ralphs     Family History  Problem Relation Age of Onset  . Lung cancer Mother     mets to brain  . Heart attack Mother     36  . Hypertension Mother   . Heart attack Father     mid 31s, smoker. died 44.   . Diabetes Father   . Diabetes Paternal Grandfather      Social History   Social History  .  Marital Status: Married    Spouse Name: N/A  . Number of Children: N/A  . Years of Education: N/A   Occupational History  . Not on file.   Social History Main Topics  . Smoking status: Former Smoker -- 1.00 packs/day for 20 years    Types: Cigarettes    Quit date: 11/04/1991  . Smokeless tobacco: Never Used  . Alcohol Use: 0.0 oz/week    0 Standard drinks or equivalent per week     Comment: occ  . Drug Use: No  . Sexual Activity: Not on file   Other Topics Concern  . Not on file   Social History Narrative   Married. 1 child and 1 step child. 2 grandchildren.       Lawyer (part time)      Hobbies: swimming (states not fun), travel     BP 122/76 mmHg  Pulse 70  Ht 5\' 8"  (1.727 m)  Wt 196 lb 9.6 oz (89.177 kg)  BMI 29.90 kg/m2  SpO2 96%  Physical Exam:  Well appearing middle-aged man,NAD HEENT: Unremarkable Neck:  6 cm  JVD, no thyromegally Lungs:  Clear with no wheezes, rales, or rhonchi. HEART:  Regular rate rhythm, no murmurs, no rubs, no clicks Abd:  soft, positive bowel sounds, no organomegally, no rebound, no guarding Ext:  2 plus pulses, no edema, no cyanosis, no clubbing Skin:  No rashes no nodules Neuro:  CN II through XII intact, motor grossly intact  A/P 1. Chest pain/CAD - we discussed the treatment options. I have recommended we try and uptitrate his crestor and start low dose metoprolol. 2. PAF - he is maintaining NSR. He will continue his anticoagulation. 3. PPM - his Biotronik device is working normally. Will follow. 4. Remote stroke - he had a stroke after his last heart cath which was performed about 15-20 years ago.  [name].D.

## 2016-03-04 NOTE — Patient Instructions (Addendum)
Medication Instructions:  Your physician has recommended you make the following change in your medication:  1) Start Metoprolol 12.5 mg twice daily 2) Start Zantac 150 mg twice daily 3) Increase Crestor 20 mg as directed   Labwork: None ordered   Testing/Procedures: None ordered   Follow-Up: Your physician recommends that you schedule a follow-up appointment in: 3 months with Dr Lovena Le   Any Other Special Instructions Will Be Listed Below (If Applicable).     If you need a refill on your cardiac medications before your next appointment, please call your pharmacy.

## 2016-03-06 ENCOUNTER — Ambulatory Visit: Payer: Medicare Other | Admitting: Physician Assistant

## 2016-03-24 ENCOUNTER — Other Ambulatory Visit: Payer: Self-pay | Admitting: Family Medicine

## 2016-03-31 ENCOUNTER — Other Ambulatory Visit: Payer: Self-pay | Admitting: Internal Medicine

## 2016-04-18 DIAGNOSIS — Z8679 Personal history of other diseases of the circulatory system: Secondary | ICD-10-CM | POA: Insufficient documentation

## 2016-04-18 DIAGNOSIS — I25118 Atherosclerotic heart disease of native coronary artery with other forms of angina pectoris: Secondary | ICD-10-CM | POA: Insufficient documentation

## 2016-04-23 ENCOUNTER — Encounter: Payer: Self-pay | Admitting: Family Medicine

## 2016-04-23 DIAGNOSIS — I428 Other cardiomyopathies: Secondary | ICD-10-CM | POA: Insufficient documentation

## 2016-04-28 ENCOUNTER — Encounter: Payer: Self-pay | Admitting: *Deleted

## 2016-05-01 ENCOUNTER — Encounter: Payer: Self-pay | Admitting: Internal Medicine

## 2016-05-01 ENCOUNTER — Ambulatory Visit (INDEPENDENT_AMBULATORY_CARE_PROVIDER_SITE_OTHER): Payer: Medicare Other | Admitting: Internal Medicine

## 2016-05-01 VITALS — BP 110/70 | HR 66 | Ht 68.0 in | Wt 170.8 lb

## 2016-05-01 DIAGNOSIS — I495 Sick sinus syndrome: Secondary | ICD-10-CM | POA: Diagnosis not present

## 2016-05-01 DIAGNOSIS — I208 Other forms of angina pectoris: Secondary | ICD-10-CM | POA: Diagnosis not present

## 2016-05-01 DIAGNOSIS — E785 Hyperlipidemia, unspecified: Secondary | ICD-10-CM

## 2016-05-01 DIAGNOSIS — M2550 Pain in unspecified joint: Secondary | ICD-10-CM

## 2016-05-01 NOTE — Patient Instructions (Signed)
Medication Instructions:  Your physician recommends that you continue on your current medications as directed. Please refer to the Current Medication list given to you today.   Labwork: Your physician recommends that you return for lab work next 1-2 weeks: fasting lipids/liver/CPK   Testing/Procedures: None ordered   Follow-Up: Your physician recommends that you schedule a follow-up appointment in: 3 months with Dr Lovena Le   Any Other Special Instructions Will Be Listed Below (If Applicable).     If you need a refill on your cardiac medications before your next appointment, please call your pharmacy.

## 2016-05-01 NOTE — Progress Notes (Signed)
HPI [name] returns today for followup. He is a very pleasant 70 yo man with sinus node dysfunction, status post permanent pacemaker insertion. He also is a history of hypertension which has been well-controlled.  He has recently been bothered by worsening chest pain at times with exertion and at times at rest. He denies sob or syncope. His pain radiates to his arms. Since I last saw the patient, he has undergone stress myoview study which demonstrated moderate LV dysfunction and a mild perfusion defect. In addition, he has undergone a CT of the coronary arteries and which demonstrated moderate disease but no tight stenosis. He is still exercising 4 times a week. No Known Allergies   Current Outpatient Prescriptions  Medication Sig Dispense Refill  . benazepril (LOTENSIN) 5 MG tablet TAKE ONE TABLET BY MOUTH ONCE DAILY 90 tablet 3  . finasteride (PROSCAR) 5 MG tablet Take 1 tablet (5 mg total) by mouth daily. 90 tablet 2  . fish oil-omega-3 fatty acids 1000 MG capsule Take 2 g by mouth daily. Reported on 02/18/2016    . metoprolol succinate (TOPROL-XL) 25 MG 24 hr tablet Take 1 tablet by mouth daily.    . Multiple Vitamin (MULTIVITAMIN) tablet Take 1 tablet by mouth daily.      . naproxen (NAPROSYN) 500 MG tablet Take 500 mg by mouth daily as needed (pain).     Marland Kitchen omeprazole (PRILOSEC) 20 MG capsule Take 20 mg by mouth daily as needed (heartburn or acid reflux). Reported on 02/18/2016    . ranitidine (ZANTAC) 150 MG tablet Take 1 tablet (150 mg total) by mouth 2 (two) times daily. 180 tablet 3  . rivaroxaban (XARELTO) 20 MG TABS tablet Take 1 tablet (20 mg total) by mouth daily with supper. 90 tablet 3  . rosuvastatin (CRESTOR) 20 MG tablet Take 1 tablet (20 mg total) by mouth as directed.    . tadalafil (CIALIS) 5 MG tablet TAKE 1 TABLET BY MOUTH EVERY DAY AS NEEDED FOR erectile dysfunction. 90 tablet 0  . zolpidem (AMBIEN) 5 MG tablet Take 1 tablet (5 mg total) by mouth at bedtime as needed  for sleep. 90 tablet 1   No current facility-administered medications for this visit.     Past Medical History  Diagnosis Date  . HTN (hypertension)   . CVA (cerebral vascular accident) ([place])   . Ischemic optic neuropathy   . Other and unspecified hyperlipidemia   . Bradycardia   . Abscess     right posterior neck  . Spinal stenosis   . GERD (gastroesophageal reflux disease)   . External hemorrhoids   . Elevated LFTs   . Sinoatrial node dysfunction (HCC)     ROS:   All systems reviewed and negative except as noted in the HPI.   Past Surgical History  Procedure Laterality Date  . Cardiac catheterization  1996  . Cholecystectomy  02/07/2003  . Colonoscopy  06/23/2004  . Pacemaker insertion    . Eye surgery      left eye  . Abscess drainage      righ tposterior neck  . Left ingunial hernia  06/08/2011  . Right hip replacement      2012 Dr. Maureen Ralphs     Family History  Problem Relation Age of Onset  . Lung cancer Mother     mets to brain  . Heart attack Mother 52  . Hypertension Mother   . Heart attack Father     mid 36s, smoker.   Marland Kitchen  Diabetes Father   . Diabetes Paternal Grandfather   . Brain cancer Mother   . Healthy Brother      Social History   Social History  . Marital Status: Married    Spouse Name: N/A  . Number of Children: N/A  . Years of Education: N/A   Occupational History  . Not on file.   Social History Main Topics  . Smoking status: Former Smoker -- 1.00 packs/day for 20 years    Types: Cigarettes    Quit date: 11/04/1991  . Smokeless tobacco: Never Used  . Alcohol Use: 0.0 oz/week    0 Standard drinks or equivalent per week     Comment: occ  . Drug Use: No  . Sexual Activity: Not on file   Other Topics Concern  . Not on file   Social History Narrative   Married. 1 child and 1 step child. 2 grandchildren.       Lawyer (part time)      Hobbies: swimming (states not fun), travel     BP 110/70 mmHg  Pulse 66  Ht 5\' 8"   (1.727 m)  Wt 170 lb 12.8 oz (77.474 kg)  BMI 25.98 kg/m2  Physical Exam:  Well appearing middle-aged man,NAD HEENT: Unremarkable Neck:  6 cm JVD, no thyromegally Lungs:  Clear with no wheezes, rales, or rhonchi. HEART:  Regular rate rhythm, no murmurs, no rubs, no clicks Abd:  soft, positive bowel sounds, no organomegally, no rebound, no guarding Ext:  2 plus pulses, no edema, no cyanosis, no clubbing Skin:  No rashes no nodules Neuro:  CN II through XII intact, motor grossly intact  A/P 1. Chest pain/CAD - he appears stable. He is tolerating his beta blocker 2. PAF - he is maintaining NSR. He will continue his anticoagulation. 3. PPM - his Biotronik device is working normally. Will follow. 4. Dyslipidemia - he is tolerating higher dose crestor. I will ask him to check his fasting lipids and CPK  [name].D.

## 2016-05-15 ENCOUNTER — Other Ambulatory Visit (INDEPENDENT_AMBULATORY_CARE_PROVIDER_SITE_OTHER): Payer: Medicare Other

## 2016-05-15 DIAGNOSIS — Z0000 Encounter for general adult medical examination without abnormal findings: Secondary | ICD-10-CM | POA: Diagnosis not present

## 2016-05-15 DIAGNOSIS — R7989 Other specified abnormal findings of blood chemistry: Secondary | ICD-10-CM

## 2016-05-15 DIAGNOSIS — E785 Hyperlipidemia, unspecified: Secondary | ICD-10-CM

## 2016-05-15 DIAGNOSIS — M2550 Pain in unspecified joint: Secondary | ICD-10-CM

## 2016-05-15 LAB — HEPATIC FUNCTION PANEL
ALT: 18 U/L (ref 0–53)
AST: 15 U/L (ref 0–37)
Albumin: 4 g/dL (ref 3.5–5.2)
Alkaline Phosphatase: 59 U/L (ref 39–117)
Bilirubin, Direct: 0.1 mg/dL (ref 0.0–0.3)
Total Bilirubin: 0.8 mg/dL (ref 0.2–1.2)
Total Protein: 6.2 g/dL (ref 6.0–8.3)

## 2016-05-15 LAB — CBC WITH DIFFERENTIAL/PLATELET
BASOS ABS: 0 10*3/uL (ref 0.0–0.1)
BASOS PCT: 0.4 % (ref 0.0–3.0)
EOS ABS: 0.4 10*3/uL (ref 0.0–0.7)
Eosinophils Relative: 6.6 % — ABNORMAL HIGH (ref 0.0–5.0)
HEMATOCRIT: 41.4 % (ref 39.0–52.0)
HEMOGLOBIN: 14.2 g/dL (ref 13.0–17.0)
LYMPHS PCT: 34.6 % (ref 12.0–46.0)
Lymphs Abs: 2.3 10*3/uL (ref 0.7–4.0)
MCHC: 34.2 g/dL (ref 30.0–36.0)
MCV: 87.9 fl (ref 78.0–100.0)
MONO ABS: 0.5 10*3/uL (ref 0.1–1.0)
Monocytes Relative: 8.1 % (ref 3.0–12.0)
Neutro Abs: 3.4 10*3/uL (ref 1.4–7.7)
Neutrophils Relative %: 50.3 % (ref 43.0–77.0)
Platelets: 207 10*3/uL (ref 150.0–400.0)
RBC: 4.71 Mil/uL (ref 4.22–5.81)
RDW: 13.6 % (ref 11.5–15.5)
WBC: 6.7 10*3/uL (ref 4.0–10.5)

## 2016-05-15 LAB — PSA: PSA: 0.99 ng/mL (ref 0.10–4.00)

## 2016-05-15 LAB — POC URINALSYSI DIPSTICK (AUTOMATED)
Bilirubin, UA: NEGATIVE
Glucose, UA: NEGATIVE
Ketones, UA: NEGATIVE
Leukocytes, UA: NEGATIVE
Nitrite, UA: NEGATIVE
Protein, UA: NEGATIVE
Spec Grav, UA: 1.02
Urobilinogen, UA: 0.2
pH, UA: 6

## 2016-05-15 LAB — LIPID PANEL
Cholesterol: 162 mg/dL (ref 0–200)
HDL: 34.8 mg/dL — ABNORMAL LOW
NonHDL: 127.16
Total CHOL/HDL Ratio: 5
Triglycerides: 310 mg/dL — ABNORMAL HIGH (ref 0.0–149.0)
VLDL: 62 mg/dL — ABNORMAL HIGH (ref 0.0–40.0)

## 2016-05-15 LAB — TSH: TSH: 3.67 u[IU]/mL (ref 0.35–4.50)

## 2016-05-15 LAB — BASIC METABOLIC PANEL
BUN: 15 mg/dL (ref 6–23)
CALCIUM: 8.9 mg/dL (ref 8.4–10.5)
CHLORIDE: 106 meq/L (ref 96–112)
CO2: 27 mEq/L (ref 19–32)
CREATININE: 1.15 mg/dL (ref 0.40–1.50)
GFR: 66.9 mL/min (ref >60.00)
Glucose, Bld: 100 mg/dL — ABNORMAL HIGH (ref 70–99)
Potassium: 4.1 mEq/L (ref 3.5–5.1)
Sodium: 139 mEq/L (ref 135–145)

## 2016-05-15 LAB — LDL CHOLESTEROL, DIRECT: Direct LDL: 75 mg/dL

## 2016-05-26 ENCOUNTER — Encounter: Payer: Self-pay | Admitting: Family Medicine

## 2016-05-26 ENCOUNTER — Ambulatory Visit (INDEPENDENT_AMBULATORY_CARE_PROVIDER_SITE_OTHER): Payer: Medicare Other | Admitting: Family Medicine

## 2016-05-26 VITALS — BP 120/72 | HR 85 | Temp 98.2°F | Ht 67.75 in | Wt 168.0 lb

## 2016-05-26 DIAGNOSIS — M4806 Spinal stenosis, lumbar region: Secondary | ICD-10-CM

## 2016-05-26 DIAGNOSIS — I2510 Atherosclerotic heart disease of native coronary artery without angina pectoris: Secondary | ICD-10-CM

## 2016-05-26 DIAGNOSIS — M545 Low back pain, unspecified: Secondary | ICD-10-CM | POA: Insufficient documentation

## 2016-05-26 DIAGNOSIS — E785 Hyperlipidemia, unspecified: Secondary | ICD-10-CM

## 2016-05-26 DIAGNOSIS — M48061 Spinal stenosis, lumbar region without neurogenic claudication: Secondary | ICD-10-CM

## 2016-05-26 DIAGNOSIS — N401 Enlarged prostate with lower urinary tract symptoms: Secondary | ICD-10-CM

## 2016-05-26 DIAGNOSIS — R6889 Other general symptoms and signs: Secondary | ICD-10-CM | POA: Diagnosis not present

## 2016-05-26 DIAGNOSIS — R319 Hematuria, unspecified: Secondary | ICD-10-CM

## 2016-05-26 DIAGNOSIS — Z23 Encounter for immunization: Secondary | ICD-10-CM

## 2016-05-26 DIAGNOSIS — R351 Nocturia: Secondary | ICD-10-CM

## 2016-05-26 DIAGNOSIS — Z0001 Encounter for general adult medical examination with abnormal findings: Secondary | ICD-10-CM

## 2016-05-26 DIAGNOSIS — R911 Solitary pulmonary nodule: Secondary | ICD-10-CM | POA: Diagnosis not present

## 2016-05-26 DIAGNOSIS — I4891 Unspecified atrial fibrillation: Secondary | ICD-10-CM

## 2016-05-26 DIAGNOSIS — I4892 Unspecified atrial flutter: Secondary | ICD-10-CM

## 2016-05-26 HISTORY — DX: Atherosclerotic heart disease of native coronary artery without angina pectoris: I25.10

## 2016-05-26 LAB — POC URINALSYSI DIPSTICK (AUTOMATED)
Bilirubin, UA: NEGATIVE
Glucose, UA: NEGATIVE
KETONES UA: NEGATIVE
Leukocytes, UA: NEGATIVE
Nitrite, UA: NEGATIVE
Protein, UA: NEGATIVE
SPEC GRAV UA: 1.015
UROBILINOGEN UA: 0.2
pH, UA: 6

## 2016-05-26 MED ORDER — CYCLOBENZAPRINE HCL 5 MG PO TABS
5.0000 mg | ORAL_TABLET | Freq: Three times a day (TID) | ORAL | Status: DC | PRN
Start: 2016-05-26 — End: 2019-05-19

## 2016-05-26 MED ORDER — TADALAFIL 5 MG PO TABS: ORAL_TABLET | ORAL | Status: DC

## 2016-05-26 MED ORDER — HEPATITIS A VACCINE 1440 EL U/ML IM SUSP
1.0000 mL | Freq: Once | INTRAMUSCULAR | Status: AC
  Administered 2016-05-26: 1440 [IU] via INTRAMUSCULAR

## 2016-05-26 MED ORDER — INDOMETHACIN 25 MG PO CAPS: 25.0000 mg | ORAL_CAPSULE | Freq: Two times a day (BID) | ORAL | Status: DC

## 2016-05-26 NOTE — Progress Notes (Signed)
Pre visit review using our clinic review tool, if applicable. No additional management support is needed unless otherwise documented below in the visit note. 

## 2016-05-26 NOTE — Assessment & Plan Note (Signed)
S: Crestor 20mg  daily with some myalgias; fish oil. Trig 300 despite this but had been higher in the past. LDL ok at 75 A/P: continue current medication- with new findings of CAD on CT scan- ideally under 70 but with myalgias will not titrate further

## 2016-05-26 NOTE — Assessment & Plan Note (Signed)
S: noted in cardiac workup 5 x 4 mm nodule left upper lobe. Patient concerned about follow up A/P: discovered 02/18/16 so we will plan on repeating 02/17/17 given family history lung cancer and patient 20 pack year smoker though quit 25 years ago

## 2016-05-26 NOTE — Assessment & Plan Note (Signed)
S: reasonable control on  Finasteride, flomax.  0-3x nocturia. PSA to 14-->eval urology [facility]. PSA trended back down to about 2.6 per records in 2015 then placed on finasteride- had significant voiding issues when spiked to 14- improved on meds A/P: continue current medication

## 2016-05-26 NOTE — Assessment & Plan Note (Signed)
S: continued intermittent issues. Not sure if all back pain is related to spinal stenosis as can be triggered by certain motions and improves with indomethacin and flexeril A/P: refilled flexeril- discussed bleeding and cardiac risks on indomethacin- did provide refill but stated 3 days max and stated this still increases bleeding and cardiac risk

## 2016-05-26 NOTE — Assessment & Plan Note (Signed)
S: appears to be in sinus today. xarelto for anticoagulation. toprol 25mg  XL for rate control A/P: continue current medicatoins and regular cards follow up

## 2016-05-26 NOTE — Progress Notes (Signed)
Phone: [phone]  Subjective:  Patient presents today for their annual physical. Chief complaint-noted.   See problem oriented charting- ROS- full  review of systems was completed and negative except for: does have intermittent burning chest pain going up to throat that spreads to arms- has been evaluated by cardiology at [facility] and locally with no plans for cath given would have to come off xarelto and is stable  The following were reviewed and entered/updated in epic: Past Medical History  Diagnosis Date  . HTN (hypertension)   . CVA (cerebral vascular accident) ([place])   . Ischemic optic neuropathy   . Other and unspecified hyperlipidemia   . Bradycardia   . Abscess     right posterior neck  . Spinal stenosis   . GERD (gastroesophageal reflux disease)   . External hemorrhoids   . Elevated LFTs   . Sinoatrial node dysfunction [facility])    Patient Active Problem List   Diagnosis Date Noted  . Solitary pulmonary nodule 05/26/2016    Priority: High  . CAD (coronary artery disease) 05/26/2016    Priority: High  . Nonischemic cardiomyopathy ([place]) 04/23/2016    Priority: High  . CVA (cerebral infarction) 01/30/2015    Priority: High  . Atrial fibrillation and flutter ([place]) 01/01/2015    Priority: High  . Ventricular tachycardia, non-sustained ([place]) 01/25/2013    Priority: High  . Cardiac pacemaker in situ 12/28/2009    Priority: High  . Insomnia 01/30/2015    Priority: Medium  . BPH associated with nocturia 08/14/2014    Priority: Medium  . Hyperlipidemia 07/12/2007    Priority: Medium  . NEUROPATHY, ISCHEMIC OPTIC 06/02/2007    Priority: Medium  . Essential hypertension 06/02/2007    Priority: Medium  . Left chest pressure 03/19/2015    Priority: Low  . Erectile dysfunction 01/30/2015    Priority: Low  . Former smoker 01/30/2015    Priority: Low  . Allergic rhinitis 10/11/2014    Priority: Low  . Sinoatrial node dysfunction (HCC)     Priority: Low  . SPINAL  STENOSIS, LUMBAR 08/22/2010    Priority: Low  . GERD 03/17/2010    Priority: Low   Past Surgical History  Procedure Laterality Date  . Cardiac catheterization  1996  . Cholecystectomy  02/07/2003  . Colonoscopy  06/23/2004  . Pacemaker insertion    . Eye surgery      left eye  . Abscess drainage      righ tposterior neck  . Left ingunial hernia  06/08/2011  . Right hip replacement      2012 Dr. Maureen Ralphs    Family History  Problem Relation Age of Onset  . Lung cancer Mother     mets to brain  . Heart attack Mother 92  . Hypertension Mother   . Heart attack Father     mid 37s, smoker.   . Diabetes Father   . Diabetes Paternal Grandfather   . Brain cancer Mother   . Healthy Brother     Medications- reviewed and updated Current Outpatient Prescriptions  Medication Sig Dispense Refill  . benazepril (LOTENSIN) 5 MG tablet TAKE ONE TABLET BY MOUTH ONCE DAILY 90 tablet 3  . finasteride (PROSCAR) 5 MG tablet Take 1 tablet (5 mg total) by mouth daily. 90 tablet 2  . fish oil-omega-3 fatty acids 1000 MG capsule Take 2 g by mouth daily. Reported on 02/18/2016    . metoprolol succinate (TOPROL-XL) 25 MG 24 hr tablet Take 1 tablet by  mouth daily.    . Multiple Vitamin (MULTIVITAMIN) tablet Take 1 tablet by mouth daily.      . naproxen (NAPROSYN) 500 MG tablet Take 500 mg by mouth daily as needed (pain).     Marland Kitchen omeprazole (PRILOSEC) 20 MG capsule Take 20 mg by mouth daily as needed (heartburn or acid reflux). Reported on 02/18/2016    . ranitidine (ZANTAC) 150 MG tablet Take 1 tablet (150 mg total) by mouth 2 (two) times daily. 180 tablet 3  . rivaroxaban (XARELTO) 20 MG TABS tablet Take 1 tablet (20 mg total) by mouth daily with supper. 90 tablet 3  . rosuvastatin (CRESTOR) 20 MG tablet Take 1 tablet (20 mg total) by mouth as directed.    . tadalafil (CIALIS) 5 MG tablet TAKE 1 TABLET BY MOUTH EVERY DAY AS NEEDED FOR erectile dysfunction. 90 tablet 0  . zolpidem (AMBIEN) 5 MG tablet Take  1 tablet (5 mg total) by mouth at bedtime as needed for sleep. 90 tablet 1   Allergies-reviewed and updated No Known Allergies  Social History   Social History  . Marital Status: Married    Spouse Name: N/A  . Number of Children: N/A  . Years of Education: N/A   Social History Main Topics  . Smoking status: Former Smoker -- 1.00 packs/day for 20 years    Types: Cigarettes    Quit date: 11/04/1991  . Smokeless tobacco: Never Used  . Alcohol Use: 0.0 oz/week    0 Standard drinks or equivalent per week     Comment: occ  . Drug Use: No  . Sexual Activity: Not Asked   Other Topics Concern  . None   Social History Narrative   Married. 1 child and 1 step child. 2 grandchildren.       Lawyer (part time)      Hobbies: swimming (states not fun), travel    Objective: BP 120/72 mmHg  Pulse 85  Temp(Src) 98.2 F (36.8 C) (Oral)  Ht 5' 7.75" (1.721 m)  Wt 168 lb (76.204 kg)  BMI 25.73 kg/m2  SpO2 96% Gen: NAD, resting comfortably HEENT: Mucous membranes are moist. Oropharynx normal Neck: no thyromegaly CV: RRR no murmurs rubs or gallops Lungs: CTAB no crackles, wheeze, rhonchi Abdomen: soft/nontender/nondistended/normal bowel sounds. No rebound or guarding. Rectal: normal tone, normal size prostate despite finasteride, no masses or tenderness  Ext: no edema Skin: warm, dry Neuro: grossly normal, moves all extremities, PERRLA  Assessment/Plan:  70 y.o. male presenting for annual physical.  Health Maintenance counseling: 1. Anticipatory guidance: Patient counseled regarding regular dental exams, eye exams, wearing seatbelts.  2. Risk factor reduction:  Advised patient of need for regular exercise and diet rich and fruits and vegetables to reduce risk of heart attack and stroke. Swimming 4 days a week. 3. Immunizations/screenings/ancillary studies Immunization History  Administered Date(s) Administered  . Hepatitis A, Adult 08/08/2015, 05/26/2016  . Influenza Split  11/09/2011, 09/20/2012  . Influenza Whole 11/14/2007  . Influenza, High Dose Seasonal PF 08/08/2015  . Influenza,inj,Quad PF,36+ Mos 09/14/2013, 12/27/2014  . Pneumococcal Conjugate-13 01/30/2015  . Pneumococcal Polysaccharide-23 11/09/2011  . Td 10/04/2008   Health Maintenance Due  Topic Date Due  . Hepatitis C Screening - next labs  06-18-1946  . ZOSTAVAX - offered, declines 10/18/2006   4. Prostate cancer screening- low risk based off PSA trend and rectal exam. Is on finasteride and psa before this was 1.3- so not as significant reduction as could be expected Lab Results  Component  Value Date   PSA 0.99 05/15/2016   PSA 0.91 08/08/2015   PSA 1.16 01/30/2015   5. Colon cancer screening - 03/04/2010 with 10 year repeat 6. Skin cancer screening-  Sees dermatology Dr. Delman Cheadle  Status of chronic or acute concerns  Cardiac-V tach non sustained noted on pacemaker in past (has for SA noted dysfunction), follows at [facility] with cardiology for nonischemic cardiomyopathy. EF 40% as of last cards note. Also on toprol 25mg  XL and benazepril  Hematuria- trace again today. Run at 88 39 and lab had left before I had time to add on microscopic- will see if preserved and can run tomorrow, if not come back for urine microscopic only  Solitary pulmonary nodule S: noted in cardiac workup 5 x 4 mm nodule left upper lobe. Patient concerned about follow up A/P: discovered 02/18/16 so we will plan on repeating 02/17/17 given family history lung cancer and patient 20 pack year smoker though quit 25 years ago   Hyperlipidemia S: Crestor 20mg  daily with some myalgias; fish oil. Trig 300 despite this but had been higher in the past. LDL ok at 75 A/P: continue current medication- with new findings of CAD on CT scan- ideally under 70 but with myalgias will not titrate further   CAD (coronary artery disease) S: Coronary CTA_ 50-75% D1 off CT report. LDL 75 on statin. On xarelto, not aspirin due to prior CVA and a  fib A/P: tough situation- patient admits to some chest pain but hesistancy to do cath electively given prior CVA off xarelto. Has discussed situation with cards locally and at [facility]- will continue to monitor- if worsening he is to report to cardiology   Atrial fibrillation and flutter S: appears to be in sinus today. xarelto for anticoagulation. toprol 25mg  XL for rate control A/P: continue current medicatoins and regular cards follow up  SPINAL STENOSIS, LUMBAR S: continued intermittent issues. Not sure if all back pain is related to spinal stenosis as can be triggered by certain motions and improves with indomethacin and flexeril A/P: refilled flexeril- discussed bleeding and cardiac risks on indomethacin- did provide refill but stated 3 days max and stated this still increases bleeding and cardiac risk   BPH associated with nocturia S: reasonable control on  Finasteride, flomax.  0-3x nocturia. PSA to 14-->eval urology [facility]. PSA trended back down to about 2.6 per records in 2015 then placed on finasteride- had significant voiding issues when spiked to 14- improved on meds A/P: continue current medication    Return in about 6 months (around 11/25/2016).  Orders Placed This Encounter  Procedures  . POCT Urinalysis Dipstick (Automated)    Meds ordered this encounter  Medications  . cyclobenzaprine (FLEXERIL) 5 MG tablet    Sig: Take 1 tablet (5 mg total) by mouth 3 (three) times daily as needed for muscle spasms.    Dispense:  30 tablet    Refill:  2  . indomethacin (INDOCIN) 25 MG capsule    Sig: Take 1 capsule (25 mg total) by mouth 2 (two) times daily with a meal. 3 days maximum    Dispense:  60 capsule    Refill:  1  . tadalafil (CIALIS) 5 MG tablet    Sig: TAKE 1 TABLET BY MOUTH EVERY DAY AS NEEDED FOR erectile dysfunction.    Dispense:  90 tablet    Refill:  0  . tadalafil (CIALIS) 5 MG tablet    Sig: TAKE 1 TABLET BY MOUTH EVERY DAY AS NEEDED FOR  erectile dysfunction.     Dispense:  90 tablet    Refill:  0  . hepatitis A virus (PF) vaccine (HAVRIX (PF)) injection 1,440 Units    Sig:     Return precautions advised.   Garret Reddish, MD

## 2016-05-26 NOTE — Patient Instructions (Signed)
Hepatitis A today  No changes in medicine except filled flexeril and indomethacin. We discussed risks of indomethacin- use very sparingly

## 2016-05-26 NOTE — Assessment & Plan Note (Signed)
S: Coronary CTA_ 50-75% D1 off CT report. LDL 75 on statin. On xarelto, not aspirin due to prior CVA and a fib A/P: tough situation- patient admits to some chest pain but hesistancy to do cath electively given prior CVA off xarelto. Has discussed situation with cards locally and at [facility]- will continue to monitor- if worsening he is to report to cardiology

## 2016-05-27 LAB — URINALYSIS, MICROSCOPIC ONLY: RBC / HPF: NONE SEEN (ref 0–?)

## 2016-05-27 NOTE — Addendum Note (Signed)
Addended by: Gari Crown D on: 05/27/2016 08:52 AM   Modules accepted: Orders

## 2016-07-06 ENCOUNTER — Other Ambulatory Visit: Payer: Self-pay | Admitting: Family Medicine

## 2016-07-27 ENCOUNTER — Encounter: Payer: Self-pay | Admitting: Internal Medicine

## 2016-08-10 ENCOUNTER — Encounter: Payer: Self-pay | Admitting: Internal Medicine

## 2016-08-10 ENCOUNTER — Ambulatory Visit (INDEPENDENT_AMBULATORY_CARE_PROVIDER_SITE_OTHER): Payer: Medicare Other | Admitting: Internal Medicine

## 2016-08-10 ENCOUNTER — Encounter (INDEPENDENT_AMBULATORY_CARE_PROVIDER_SITE_OTHER): Payer: Self-pay

## 2016-08-10 VITALS — BP 120/74 | HR 67 | Ht 68.0 in | Wt 169.0 lb

## 2016-08-10 DIAGNOSIS — E785 Hyperlipidemia, unspecified: Secondary | ICD-10-CM

## 2016-08-10 DIAGNOSIS — Z950 Presence of cardiac pacemaker: Secondary | ICD-10-CM

## 2016-08-10 DIAGNOSIS — I495 Sick sinus syndrome: Secondary | ICD-10-CM | POA: Diagnosis not present

## 2016-08-10 LAB — CUP PACEART INCLINIC DEVICE CHECK
Date Time Interrogation Session: 20170911182505
Implantable Lead Implant Date: 20031017
Implantable Lead Location: 753859
Implantable Lead Location: 753860
Implantable Lead Model: 5076
Lead Channel Impedance Value: 409 Ohm
Lead Channel Pacing Threshold Pulse Width: 0.4 ms
Lead Channel Sensing Intrinsic Amplitude: 14.7 mV
Lead Channel Setting Pacing Amplitude: 2 V
Lead Channel Setting Pacing Pulse Width: 0.4 ms
MDC IDC LEAD IMPLANT DT: 20031017
MDC IDC MSMT LEADCHNL RA PACING THRESHOLD AMPLITUDE: 0.7 V
MDC IDC MSMT LEADCHNL RV IMPEDANCE VALUE: 429 Ohm
MDC IDC MSMT LEADCHNL RV PACING THRESHOLD AMPLITUDE: 1.3 V
MDC IDC MSMT LEADCHNL RV PACING THRESHOLD PULSEWIDTH: 0.4 ms
MDC IDC SET LEADCHNL RV PACING AMPLITUDE: 2.6 V
MDC IDC STAT BRADY RA PERCENT PACED: 100 %
MDC IDC STAT BRADY RV PERCENT PACED: 0 %
Pulse Gen Serial Number: 66063261

## 2016-08-10 MED ORDER — GEMFIBROZIL 600 MG PO TABS: 600.0000 mg | ORAL_TABLET | Freq: Two times a day (BID) | ORAL | 3 refills | Status: DC

## 2016-08-10 NOTE — Progress Notes (Signed)
HPI Mr. Georgiev returns today for followup. He is a very pleasant 70 yo man with sinus node dysfunction, status post permanent pacemaker insertion. He also is a history of hypertension which has been well-controlled. In addition, he has undergone a CT of the coronary arteries and which demonstrated moderate disease but no tight stenosis. He is still exercising 4 times a week. He had a cardiac MR which demonstrated mild LV dysfunction. He has not had angina. We noted that his triglycerides are elevated.  No Known Allergies   Current Outpatient Prescriptions  Medication Sig Dispense Refill  . benazepril (LOTENSIN) 5 MG tablet TAKE ONE TABLET BY MOUTH ONCE DAILY 90 tablet 3  . cyclobenzaprine (FLEXERIL) 5 MG tablet Take 1 tablet (5 mg total) by mouth 3 (three) times daily as needed for muscle spasms. 30 tablet 2  . finasteride (PROSCAR) 5 MG tablet Take 1 tablet (5 mg total) by mouth daily. 90 tablet 2  . fish oil-omega-3 fatty acids 1000 MG capsule Take 2 g by mouth daily. Reported on 02/18/2016    . indomethacin (INDOCIN) 25 MG capsule Take 1 capsule (25 mg total) by mouth 2 (two) times daily with a meal. 3 days maximum 60 capsule 1  . metoprolol succinate (TOPROL-XL) 25 MG 24 hr tablet Take 1 tablet by mouth daily.    . Multiple Vitamin (MULTIVITAMIN) tablet Take 1 tablet by mouth daily.      . nitroGLYCERIN (NITROSTAT) 0.4 MG SL tablet Place 1 tablet under the tongue as directed.    . ranitidine (ZANTAC) 150 MG tablet Take 1 tablet (150 mg total) by mouth 2 (two) times daily. 180 tablet 3  . rivaroxaban (XARELTO) 20 MG TABS tablet Take 1 tablet (20 mg total) by mouth daily with supper. 90 tablet 3  . rosuvastatin (CRESTOR) 20 MG tablet Take 1 tablet (20 mg total) by mouth as directed.    . tadalafil (CIALIS) 5 MG tablet TAKE 1 TABLET BY MOUTH EVERY DAY AS NEEDED FOR erectile dysfunction. 90 tablet 0  . tamsulosin (FLOMAX) 0.4 MG CAPS capsule Take 1 capsule by mouth daily.    Marland Kitchen zolpidem  (AMBIEN) 5 MG tablet Take 1 tablet (5 mg total) by mouth at bedtime as needed for sleep. 90 tablet 1   No current facility-administered medications for this visit.      Past Medical History:  Diagnosis Date  . Abscess    right posterior neck  . Bradycardia   . CVA (cerebral vascular accident) ([place])   . Elevated LFTs   . External hemorrhoids   . GERD (gastroesophageal reflux disease)   . HTN (hypertension)   . Ischemic optic neuropathy   . Other and unspecified hyperlipidemia   . Sinoatrial node dysfunction (HCC)   . Spinal stenosis     ROS:   All systems reviewed and negative except as noted in the HPI.   Past Surgical History:  Procedure Laterality Date  . ABSCESS DRAINAGE     righ tposterior neck  . CARDIAC CATHETERIZATION  1996  . CHOLECYSTECTOMY  02/07/2003  . COLONOSCOPY  06/23/2004  . EYE SURGERY     left eye  . left ingunial hernia  06/08/2011  . PACEMAKER INSERTION    . right hip replacement     2012 Dr. Maureen Ralphs     Family History  Problem Relation Age of Onset  . Lung cancer Mother     mets to brain  . Heart attack Mother 60  . Hypertension Mother   .  Brain cancer Mother   . Heart attack Father     mid 34s, smoker.   . Diabetes Father   . Diabetes Paternal Grandfather   . Healthy Brother      Social History   Social History  . Marital status: Married    Spouse name: N/A  . Number of children: N/A  . Years of education: N/A   Occupational History  . Not on file.   Social History Main Topics  . Smoking status: Former Smoker    Packs/day: 1.00    Years: 20.00    Types: Cigarettes    Quit date: 11/04/1991  . Smokeless tobacco: Never Used  . Alcohol use 0.0 oz/week     Comment: occ  . Drug use: No  . Sexual activity: Not on file   Other Topics Concern  . Not on file   Social History Narrative   Married. 1 child and 1 step child. 2 grandchildren.       Lawyer (part time)      Hobbies: swimming (states not fun), travel      BP 120/74   Pulse 67   Ht 5\' 8"  (1.727 m)   Wt 169 lb (76.7 kg)   SpO2 97%   BMI 25.70 kg/m   Physical Exam:  Well appearing middle-aged man,NAD HEENT: Unremarkable Neck:  6 cm JVD, no thyromegally Lungs:  Clear with no wheezes, rales, or rhonchi. HEART:  Regular rate rhythm, no murmurs, no rubs, no clicks Abd:  soft, positive bowel sounds, no organomegally, no rebound, no guarding Ext:  2 plus pulses, no edema, no cyanosis, no clubbing Skin:  No rashes no nodules Neuro:  CN II through XII intact, motor grossly intact  A/P 1. Chest pain/CAD - he appears stable. He is tolerating his beta blocker 2. PAF - he is maintaining NSR. He will continue his anticoagulation. 3. PPM - his Biotronik device is working normally. Will follow. 4. Dyslipidemia - he is tolerating higher dose crestor. He has very high triglycerides. I have asked him to start gemfibrozil.  [name].D.

## 2016-08-10 NOTE — Patient Instructions (Addendum)
Medication Instructions:  Your physician has recommended you make the following change in your medication:   1) START Gemfibrozil 600 mg twice a day prior to meals.    Labwork: Fasting lipid panel in 3 months   Testing/Procedures: None Ordered   Follow-Up: Your physician wants you to follow-up in: 6 months with the [facility] and 1 year with Dr. Lovena Le.  You will receive a reminder letter in the mail two months in advance. If you don't receive a letter, please call our office to schedule the follow-up appointment.    Any Other Special Instructions Will Be Listed Below (If Applicable).     If you need a refill on your cardiac medications before your next appointment, please call your pharmacy.

## 2016-08-13 ENCOUNTER — Telehealth: Payer: Self-pay | Admitting: Family Medicine

## 2016-08-13 NOTE — Telephone Encounter (Signed)
Per guidelines not technically due until 02/17/17- it would be fine to wait until early April   If he prefers to do now- you can order ct chest with contrast under pulmonary nodule and then we can call when scheduled

## 2016-08-13 NOTE — Telephone Encounter (Signed)
° °  Pt said he will be out of town the first 3 months of next year and would like to follow up on the spot on his lungs between now and Nov. He said he just wants to have an xray and not come in for an appt

## 2016-08-14 ENCOUNTER — Ambulatory Visit: Payer: Medicare Other | Admitting: Family Medicine

## 2016-08-14 ENCOUNTER — Other Ambulatory Visit: Payer: Self-pay

## 2016-08-14 DIAGNOSIS — R911 Solitary pulmonary nodule: Secondary | ICD-10-CM

## 2016-08-14 NOTE — Telephone Encounter (Signed)
Spoke with patient who states he would rather have the x-ray sooner rather than later. I told him I would put the order in and for him to be on the lookout for a phone call to get him scheduled. Patient verbalized understanding

## 2016-08-17 ENCOUNTER — Other Ambulatory Visit: Payer: Self-pay | Admitting: Family Medicine

## 2016-08-17 DIAGNOSIS — G4700 Insomnia, unspecified: Secondary | ICD-10-CM

## 2016-08-17 NOTE — Telephone Encounter (Signed)
Yes thanks 

## 2016-08-19 ENCOUNTER — Telehealth: Payer: Self-pay

## 2016-08-19 NOTE — Telephone Encounter (Signed)
PA request received from [facility] for zolpidem 5mg . PA submitted & is pending. Key: [place]

## 2016-08-20 NOTE — Telephone Encounter (Signed)
Please inform patient of denial. He can pay for out of pocket if he wants or we could try first step of belsomra 10mg  30 minutes before bed #30 with 2 refills

## 2016-08-20 NOTE — Telephone Encounter (Signed)
LVM for pt to call back as soon as possible.   

## 2016-08-20 NOTE — Telephone Encounter (Signed)
PA denied because PA requires the following:  You have a history of failure, contraindication, or intolerance to both of the following: Belsomra AND Rozerem

## 2016-08-21 NOTE — Telephone Encounter (Signed)
Left message on voicemail to call office.  

## 2016-08-25 NOTE — Telephone Encounter (Signed)
Pt called back, told him PA was denied for Zolidem. Told him he can pay out of pocket or can try Belsomra 10 mg take 30 minutes prior to bedtime. Pt verbalized understanding and said to send both prescriptions and he will decide which one. Told him okay.

## 2016-08-26 NOTE — Telephone Encounter (Signed)
Yes thanks, ok to send both- he likely will just weight the costs of the two options

## 2016-08-28 ENCOUNTER — Other Ambulatory Visit: Payer: Self-pay

## 2016-08-28 DIAGNOSIS — G4700 Insomnia, unspecified: Secondary | ICD-10-CM

## 2016-08-28 MED ORDER — SUVOREXANT 10 MG PO TABS: 10.0000 mg | ORAL_TABLET | Freq: Every evening | ORAL | 0 refills | Status: DC | PRN

## 2016-08-28 MED ORDER — ZOLPIDEM TARTRATE 5 MG PO TABS: 5.0000 mg | ORAL_TABLET | Freq: Every evening | ORAL | 5 refills | Status: DC | PRN

## 2016-08-28 NOTE — Telephone Encounter (Signed)
Printed out prescriptions for both. Placed up front for pick up. Called patient and left a voicemail message asking patient for a return phone call to notify him.

## 2016-09-01 ENCOUNTER — Ambulatory Visit
Admission: RE | Admit: 2016-09-01 | Discharge: 2016-09-01 | Disposition: A | Discharge status: Home / Self Care | Payer: Medicare Other | Source: Ambulatory Visit | Attending: Family Medicine | Admitting: Family Medicine

## 2016-09-01 DIAGNOSIS — R911 Solitary pulmonary nodule: Secondary | ICD-10-CM

## 2016-09-01 MED ORDER — IOPAMIDOL (ISOVUE-300) INJECTION 61%
75.0000 mL | Freq: Once | INTRAVENOUS | Status: AC | PRN
  Administered 2016-09-01: 75 mL via INTRAVENOUS

## 2016-09-02 ENCOUNTER — Other Ambulatory Visit: Payer: Self-pay | Admitting: Internal Medicine

## 2016-09-02 DIAGNOSIS — I4891 Unspecified atrial fibrillation: Secondary | ICD-10-CM

## 2016-09-02 DIAGNOSIS — I4892 Unspecified atrial flutter: Principal | ICD-10-CM

## 2016-09-15 ENCOUNTER — Other Ambulatory Visit: Payer: Self-pay | Admitting: Family Medicine

## 2016-09-15 DIAGNOSIS — R351 Nocturia: Principal | ICD-10-CM

## 2016-09-15 DIAGNOSIS — N401 Enlarged prostate with lower urinary tract symptoms: Secondary | ICD-10-CM

## 2016-11-03 ENCOUNTER — Other Ambulatory Visit (INDEPENDENT_AMBULATORY_CARE_PROVIDER_SITE_OTHER): Payer: Medicare Other

## 2016-11-03 DIAGNOSIS — E785 Hyperlipidemia, unspecified: Secondary | ICD-10-CM

## 2016-11-03 DIAGNOSIS — M2550 Pain in unspecified joint: Secondary | ICD-10-CM

## 2016-11-03 LAB — HEPATIC FUNCTION PANEL
ALT: 26 U/L (ref 9–46)
AST: 23 U/L (ref 10–35)
Albumin: 4.5 g/dL (ref 3.6–5.1)
Alkaline Phosphatase: 64 U/L (ref 40–115)
BILIRUBIN DIRECT: 0.2 mg/dL (ref <=0.2)
BILIRUBIN INDIRECT: 0.6 mg/dL (ref 0.2–1.2)
BILIRUBIN TOTAL: 0.8 mg/dL (ref 0.2–1.2)
Total Protein: 6.7 g/dL (ref 6.1–8.1)

## 2016-11-03 LAB — LIPID PANEL
CHOL/HDL RATIO: 4.3 ratio (ref <5.0)
Cholesterol: 181 mg/dL (ref <200)
HDL: 42 mg/dL (ref >40)
LDL Cholesterol: 112 mg/dL — ABNORMAL HIGH (ref <100)
Triglycerides: 133 mg/dL (ref <150)
VLDL: 27 mg/dL (ref <30)

## 2016-11-04 LAB — CK TOTAL AND CKMB (NOT AT ARMC)
CK, MB: 2.2 ng/mL (ref 0.0–5.0)
Relative Index: 2.2 (ref 0.0–4.0)
Total CK: 102 U/L (ref 7–232)

## 2016-11-12 ENCOUNTER — Telehealth: Payer: Self-pay | Admitting: Internal Medicine

## 2016-11-12 NOTE — Telephone Encounter (Signed)
Called, spoke with pt. Reviewed recent labs - hepatic function and cholesterol. Informed Dr. Tanna Furry recommendation is for pt to do a better job reducing fats and sweets in the diet. Pt verbalized understanding and agreed with plan.

## 2016-11-12 NOTE — Telephone Encounter (Signed)
Mr. Corio is calling about his test results.

## 2016-11-18 ENCOUNTER — Other Ambulatory Visit: Payer: Self-pay | Admitting: Family Medicine

## 2016-11-20 ENCOUNTER — Telehealth: Payer: Self-pay | Admitting: Emergency Medicine

## 2016-11-20 NOTE — Telephone Encounter (Signed)
Pt wants generic medication for Crestor. Pharmacy wants to know if its okay.   [facility].

## 2016-11-20 NOTE — Telephone Encounter (Signed)
Can you clarify with patient the dosage? I thought he was on 20mg  rosuvastatin but I saw Jamie sent in 5mg  crestor. If he is ok with 20mg  atorvastatin you may send this in for daily usage #90 with 3 refills.

## 2016-11-24 ENCOUNTER — Other Ambulatory Visit: Payer: Self-pay | Admitting: Emergency Medicine

## 2016-11-24 NOTE — Telephone Encounter (Signed)
Left a message for patient to call the office back.

## 2016-11-25 ENCOUNTER — Other Ambulatory Visit: Payer: Self-pay | Admitting: Emergency Medicine

## 2016-11-25 NOTE — Telephone Encounter (Signed)
Spoke with pt and he is okay with the generic brand of atorvastatin but wants to know why his dosage is increasing from 5mg  to 20mg 

## 2016-11-26 ENCOUNTER — Other Ambulatory Visit: Payer: Self-pay | Admitting: Emergency Medicine

## 2016-11-26 DIAGNOSIS — I255 Ischemic cardiomyopathy: Secondary | ICD-10-CM

## 2016-11-26 MED ORDER — ROSUVASTATIN CALCIUM 20 MG PO TABS: 20.0000 mg | ORAL_TABLET | ORAL | 3 refills | Status: DC

## 2016-11-26 NOTE — Telephone Encounter (Signed)
My apologies- I wrote about rosuvastatin/crestor and atorvastatin/lipitor. Patient should be on ROSUVASTATIN 20mg - I apologize for any confusion.

## 2016-11-27 ENCOUNTER — Other Ambulatory Visit: Payer: Self-pay | Admitting: Emergency Medicine

## 2016-11-27 ENCOUNTER — Telehealth: Payer: Self-pay | Admitting: Internal Medicine

## 2016-11-27 NOTE — Telephone Encounter (Signed)
Spoke with the pt in regarding Rosuvastatin refill that I sent in yesterday. Pt sates that he could not get his prescription because the pharmacy had further questions. Called the pharmacy and the pharmacist stated that he was concerned about the pt taking Crestor with the Lopid would cause muscle spasm aches. The pharmacist needed confirmation that it would be okay to fill both med's. I also called his cardiology office since that's where the medication was prescribed and increased. Spoke with someone from cardiology office and the nurse confirmed that they would fix it and call the pharmacist.

## 2016-11-27 NOTE — Telephone Encounter (Signed)
New Message:    Pharmacist will not refill the Rosuvastatin and the Lopid because both if the will cause muscle spasms. Needs the okay to refill this. Please call [facility] [phone] to let them know if this is alright to refill.Pt needs his medicine for the weekend.

## 2016-11-27 NOTE — Telephone Encounter (Signed)
Pt prescription sent in

## 2016-11-27 NOTE — Telephone Encounter (Signed)
Spoke with pharmacist in [place], and she said ok for pt to take both medications and to call if any problems.  Spoke with pharmacist at [facility] and she said they needed ok for pt to take Lopid and Crestor. Advised her this was fine.  She also needed clarification on directions.  Advised QD.    Spoke with pt and made him aware that medication is being filled by [facility].  Pt states that he is unsure of when Crestor was increased to 20mg  and that he has been taking 5mg  this entire time, never started 20mg .  Pt prefers to take 5mg  QD.  Advised I would need to send to Dr. Lovena Le for review and advisement.  Pt leaving to go to [place] on Gam[place], will be gone for 9 weeks.  Advised pt once we hear from Dr. Lovena Le we can call but I can't guarantee it will prior to his flight.  Pt appreciative for assistance.

## 2016-12-02 NOTE — Telephone Encounter (Signed)
Ok to take the low dose of crestor and lopid. He will need LFT's check in 3 months and yearly. GT

## 2016-12-10 NOTE — Telephone Encounter (Signed)
Called, unable to reach pt. No voicemail.

## 2017-02-22 ENCOUNTER — Ambulatory Visit (INDEPENDENT_AMBULATORY_CARE_PROVIDER_SITE_OTHER): Payer: Medicare Other | Admitting: *Deleted

## 2017-02-22 DIAGNOSIS — I495 Sick sinus syndrome: Secondary | ICD-10-CM

## 2017-02-22 DIAGNOSIS — Z950 Presence of cardiac pacemaker: Secondary | ICD-10-CM | POA: Diagnosis not present

## 2017-02-22 LAB — CUP PACEART INCLINIC DEVICE CHECK
Brady Statistic RA Percent Paced: 100 %
Date Time Interrogation Session: 20180326121721
Implantable Lead Implant Date: 20031017
Implantable Lead Implant Date: 20031017
Implantable Lead Model: 5076
Implantable Lead Model: 5076
Implantable Pulse Generator Implant Date: 20120210
Lead Channel Impedance Value: 448 Ohm
Lead Channel Pacing Threshold Amplitude: 3.5 V
Lead Channel Pacing Threshold Pulse Width: 0.4 ms
Lead Channel Pacing Threshold Pulse Width: 0.75 ms
Lead Channel Pacing Threshold Pulse Width: 0.75 ms
Lead Channel Setting Pacing Amplitude: 4.5 V
MDC IDC LEAD LOCATION: 753859
MDC IDC LEAD LOCATION: 753860
MDC IDC MSMT LEADCHNL RA PACING THRESHOLD AMPLITUDE: 0.7 V
MDC IDC MSMT LEADCHNL RA PACING THRESHOLD AMPLITUDE: 0.7 V
MDC IDC MSMT LEADCHNL RA PACING THRESHOLD PULSEWIDTH: 0.4 ms
MDC IDC MSMT LEADCHNL RV IMPEDANCE VALUE: 1833 Ohm
MDC IDC MSMT LEADCHNL RV PACING THRESHOLD AMPLITUDE: 3.5 V
MDC IDC MSMT LEADCHNL RV SENSING INTR AMPL: 14.5 mV
MDC IDC SET LEADCHNL RA PACING AMPLITUDE: 2 V
MDC IDC SET LEADCHNL RV PACING PULSEWIDTH: 0.75 ms
MDC IDC STAT BRADY RV PERCENT PACED: 0 %
Pulse Gen Serial Number: 66063261

## 2017-02-22 NOTE — Progress Notes (Signed)
Pacemaker check in clinic. Sensing, RA impedance and RA threshold consistent with previous measurements. RV impedance and threshold elevated. RV capture control turned off- output set at 4.5v @ 0.13ms, 0% RV pacing. RV impedance started trending up around December, no noise noted, sensing stable. Device programmed to maximize longevity. No mode switch or high ventricular rates noted. Device programmed at appropriate safety margins. Histogram distribution appropriate for patient activity level. Device programmed to optimize intrinsic conduction. Estimated longevity 5 years, 7 months. Will review with GT when in office for recommendations. Patient not made aware d/t "mild hypochondria" noted in patient care coordination note.

## 2017-03-04 ENCOUNTER — Encounter: Payer: Self-pay | Admitting: Internal Medicine

## 2017-03-04 ENCOUNTER — Ambulatory Visit (INDEPENDENT_AMBULATORY_CARE_PROVIDER_SITE_OTHER): Payer: Medicare Other | Admitting: Internal Medicine

## 2017-03-04 VITALS — BP 116/70 | HR 69 | Ht 68.0 in | Wt 168.0 lb

## 2017-03-04 DIAGNOSIS — I495 Sick sinus syndrome: Secondary | ICD-10-CM | POA: Diagnosis not present

## 2017-03-04 DIAGNOSIS — Z950 Presence of cardiac pacemaker: Secondary | ICD-10-CM

## 2017-03-04 LAB — CUP PACEART INCLINIC DEVICE CHECK
Implantable Lead Implant Date: 20031017
Implantable Lead Location: 753860
Implantable Lead Model: 5076
Implantable Lead Model: 5076
Lead Channel Impedance Value: 409 Ohm
Lead Channel Pacing Threshold Amplitude: 0.6 V
Lead Channel Pacing Threshold Pulse Width: 0.4 ms
Lead Channel Setting Pacing Amplitude: 2 V
Lead Channel Setting Pacing Pulse Width: 0.4 ms
MDC IDC LEAD IMPLANT DT: 20031017
MDC IDC LEAD LOCATION: 753859
MDC IDC MSMT LEADCHNL RA PACING THRESHOLD PULSEWIDTH: 0.4 ms
MDC IDC MSMT LEADCHNL RV IMPEDANCE VALUE: 370 Ohm
MDC IDC MSMT LEADCHNL RV PACING THRESHOLD AMPLITUDE: 0.8 V
MDC IDC MSMT LEADCHNL RV SENSING INTR AMPL: 13.7 mV
MDC IDC PG IMPLANT DT: 20120210
MDC IDC SESS DTM: 20180405174250
MDC IDC SET LEADCHNL RV PACING AMPLITUDE: 2.4 V
Pulse Gen Serial Number: 66063261

## 2017-03-04 NOTE — Progress Notes (Signed)
HPI [name] returns today for followup. He is a very pleasant 71 yo man with sinus node dysfunction, status post permanent pacemaker insertion. He also is a history of hypertension which has been well-controlled. In addition, he has undergone a CT of the coronary arteries and which demonstrated moderate disease but no tight stenosis. He is still exercising several times a week. He had a cardiac MR which demonstrated mild LV dysfunction. He has not had angina. We noted that his triglycerides are elevated. He has been discovered to have an increasing bipolar pacing threshold in his RV associated with an increase in the bipolar pacing impedence.   No Known Allergies   Current Outpatient Prescriptions  Medication Sig Dispense Refill  . benazepril (LOTENSIN) 5 MG tablet TAKE ONE TABLET BY MOUTH ONCE DAILY 90 tablet 3  . cyclobenzaprine (FLEXERIL) 5 MG tablet Take 1 tablet (5 mg total) by mouth 3 (three) times daily as needed for muscle spasms. 30 tablet 2  . finasteride (PROSCAR) 5 MG tablet TAKE ONE TABLET BY MOUTH ONCE DAILY. 90 tablet 2  . fish oil-omega-3 fatty acids 1000 MG capsule Take 2 g by mouth daily. Reported on 02/18/2016    . gemfibrozil (LOPID) 600 MG tablet Take 1 tablet (600 mg total) by mouth 2 (two) times daily before a meal. 180 tablet 3  . indomethacin (INDOCIN) 25 MG capsule Take 25 mg by mouth 2 (two) times daily as needed (pain). Take with a meal. 3 days maximun    . metoprolol succinate (TOPROL-XL) 25 MG 24 hr tablet Take 1 tablet by mouth daily.    . Multiple Vitamin (MULTIVITAMIN) tablet Take 1 tablet by mouth daily.      . nitroGLYCERIN (NITROSTAT) 0.4 MG SL tablet Place 1 tablet under the tongue as directed.    . ranitidine (ZANTAC) 150 MG tablet Take 150 mg by mouth 2 (two) times daily as needed for heartburn.    . rosuvastatin (CRESTOR) 20 MG tablet Take 1 tablet (20 mg total) by mouth as directed. Future refills request needs to go to cardiology. Crissie Sickles MD 90  tablet 3  . tadalafil (CIALIS) 5 MG tablet TAKE 1 TABLET BY MOUTH EVERY DAY AS NEEDED FOR erectile dysfunction. 90 tablet 0  . tamsulosin (FLOMAX) 0.4 MG CAPS capsule TAKE ONE CAPSULE BY MOUTH ONCE DAILY 40 capsule 9  . XARELTO 20 MG TABS tablet TAKE 1 TABLET BY MOUTH DAILY WITH SUPPER 90 tablet 3  . zolpidem (AMBIEN) 5 MG tablet Take 1 tablet (5 mg total) by mouth at bedtime as needed. for sleep 30 tablet 5   No current facility-administered medications for this visit.      Past Medical History:  Diagnosis Date  . Abscess    right posterior neck  . Bradycardia   . CVA (cerebral vascular accident) ([place])   . Elevated LFTs   . External hemorrhoids   . GERD (gastroesophageal reflux disease)   . HTN (hypertension)   . Ischemic optic neuropathy   . Other and unspecified hyperlipidemia   . Sinoatrial node dysfunction (HCC)   . Spinal stenosis     ROS:   All systems reviewed and negative except as noted in the HPI.   Past Surgical History:  Procedure Laterality Date  . ABSCESS DRAINAGE     righ tposterior neck  . CARDIAC CATHETERIZATION  1996  . CHOLECYSTECTOMY  02/07/2003  . COLONOSCOPY  06/23/2004  . EYE SURGERY     left eye  . left ingunial  hernia  06/08/2011  . PACEMAKER INSERTION    . right hip replacement     2012 Dr. Maureen Ralphs     Family History  Problem Relation Age of Onset  . Lung cancer Mother     mets to brain  . Heart attack Mother 66  . Hypertension Mother   . Brain cancer Mother   . Heart attack Father     mid 31s, smoker.   . Diabetes Father   . Diabetes Paternal Grandfather   . Healthy Brother      Social History   Social History  . Marital status: Married    Spouse name: N/A  . Number of children: N/A  . Years of education: N/A   Occupational History  . Not on file.   Social History Main Topics  . Smoking status: Former Smoker    Packs/day: 1.00    Years: 20.00    Types: Cigarettes    Quit date: 11/04/1991  . Smokeless tobacco:  Never Used  . Alcohol use 0.0 oz/week     Comment: occ  . Drug use: No  . Sexual activity: Not on file   Other Topics Concern  . Not on file   Social History Narrative   Married. 1 child and 1 step child. 2 grandchildren.       Lawyer (part time)      Hobbies: swimming (states not fun), travel     BP 116/70   Pulse 69   Ht 5\' 8"  (1.727 m)   Wt 168 lb (76.2 kg)   SpO2 98%   BMI 25.54 kg/m   Physical Exam:  Well appearing middle-aged man,NAD HEENT: Unremarkable Neck:  6 cm JVD, no thyromegally Lungs:  Clear with no wheezes, rales, or rhonchi. HEART:  Regular rate rhythm, no murmurs, no rubs, no clicks Abd:  soft, positive bowel sounds, no organomegally, no rebound, no guarding Ext:  2 plus pulses, no edema, no cyanosis, no clubbing Skin:  No rashes no nodules Neuro:  CN II through XII intact, motor grossly intact  A/P 1. Chest pain/CAD - he appears stable. He is tolerating his beta blocker 2. PAF - he is maintaining NSR. He will continue his anticoagulation. 3. PPM - his Biotronik device demonstrates an elevated pacing impedence (bipolar) and an elevated threshold in the RV (bipolar) as well. I have reprogrammed his RV pacing unipolar and the impedence normalized and the threshold was less than a volt (3.5 bipolar). He will undergo watchful waiting.  4. Dyslipidemia - he is tolerating higher dose crestor. He has very high triglycerides. I have asked him to continue gemfibrozil.  [name].D.

## 2017-03-04 NOTE — Patient Instructions (Addendum)
Medication Instructions:  Your physician recommends that you continue on your current medications as directed. Please refer to the Current Medication list given to you today.   Labwork: none  Testing/Procedures: none  Follow-Up: Your physician recommends that you schedule a follow-up appointment in: 6 months in the device clinic  Your physician wants you to follow-up in: September 2018 with Dr. Knox Saliva will receive a reminder letter in the mail two months in advance. If you don't receive a letter, please call our office to schedule the follow-up appointment.    Any Other Special Instructions Will Be Listed Below (If Applicable).     If you need a refill on your cardiac medications before your next appointment, please call your pharmacy.

## 2017-03-19 ENCOUNTER — Encounter: Payer: Self-pay | Admitting: Family Medicine

## 2017-03-19 ENCOUNTER — Ambulatory Visit (INDEPENDENT_AMBULATORY_CARE_PROVIDER_SITE_OTHER): Payer: Medicare Other | Admitting: Family Medicine

## 2017-03-19 VITALS — BP 128/82 | HR 83 | Temp 97.4°F | Ht 68.0 in | Wt 169.6 lb

## 2017-03-19 DIAGNOSIS — N401 Enlarged prostate with lower urinary tract symptoms: Secondary | ICD-10-CM | POA: Diagnosis not present

## 2017-03-19 DIAGNOSIS — M79672 Pain in left foot: Secondary | ICD-10-CM

## 2017-03-19 DIAGNOSIS — R351 Nocturia: Secondary | ICD-10-CM | POA: Diagnosis not present

## 2017-03-19 DIAGNOSIS — R911 Solitary pulmonary nodule: Secondary | ICD-10-CM

## 2017-03-19 NOTE — Assessment & Plan Note (Signed)
S: from nadir of 2.6 before finasteride PSA is down. Patient was told orginally may be able to come off flomax. He wants to trial this. He still does get some nocturia.  Lab Results  Component Value Date   PSA 0.99 05/15/2016   PSA 0.91 08/08/2015   PSA 1.16 01/30/2015  A/P: we agreed to trial off flomax- no history hospitalization for urinary retention. If he has significant urinary symptoms he will restart the flomax

## 2017-03-19 NOTE — Assessment & Plan Note (Signed)
S: stable 5 mm nodule in October 2017. Patient asks about repeating exam today. A/P: reviewed radiology recommendations for 12 month follow up only if high risk. Is former smoker and mom with lung cancer- we agreed to order early at physical in July

## 2017-03-19 NOTE — Progress Notes (Signed)
Subjective:  [name] is a 71 y.o. year old very pleasant male patient who presents for/with See problem oriented charting ROS- no worsening shortness of breath, no new chest pain. Sometimes has some pain in left mid back that feels like a catch with deep breath then resolves within a day or tow   Past Medical History-  Patient Active Problem List   Diagnosis Date Noted  . Solitary pulmonary nodule 05/26/2016    Priority: High  . CAD (coronary artery disease) 05/26/2016    Priority: High  . Nonischemic cardiomyopathy ([place]) 04/23/2016    Priority: High  . CVA (cerebral infarction) 01/30/2015    Priority: High  . Atrial fibrillation and flutter ([place]) 01/01/2015    Priority: High  . Ventricular tachycardia, non-sustained ([place]) 01/25/2013    Priority: High  . Cardiac pacemaker in situ 12/28/2009    Priority: High  . Insomnia 01/30/2015    Priority: Medium  . BPH associated with nocturia 08/14/2014    Priority: Medium  . Hyperlipidemia 07/12/2007    Priority: Medium  . NEUROPATHY, ISCHEMIC OPTIC 06/02/2007    Priority: Medium  . Essential hypertension 06/02/2007    Priority: Medium  . Left chest pressure 03/19/2015    Priority: Low  . Erectile dysfunction 01/30/2015    Priority: Low  . Former smoker 01/30/2015    Priority: Low  . Allergic rhinitis 10/11/2014    Priority: Low  . Sinoatrial node dysfunction (HCC)     Priority: Low  . SPINAL STENOSIS, LUMBAR 08/22/2010    Priority: Low  . GERD 03/17/2010    Priority: Low    Medications- reviewed and updated Current Outpatient Prescriptions  Medication Sig Dispense Refill  . benazepril (LOTENSIN) 5 MG tablet TAKE ONE TABLET BY MOUTH ONCE DAILY 90 tablet 3  . cyclobenzaprine (FLEXERIL) 5 MG tablet Take 1 tablet (5 mg total) by mouth 3 (three) times daily as needed for muscle spasms. 30 tablet 2  . finasteride (PROSCAR) 5 MG tablet TAKE ONE TABLET BY MOUTH ONCE DAILY. 90 tablet 2  . fish oil-omega-3 fatty acids  1000 MG capsule Take 2 g by mouth daily. Reported on 02/18/2016    . gemfibrozil (LOPID) 600 MG tablet Take 1 tablet (600 mg total) by mouth 2 (two) times daily before a meal. 180 tablet 3  . indomethacin (INDOCIN) 25 MG capsule Take 25 mg by mouth 2 (two) times daily as needed (pain). Take with a meal. 3 days maximun    . metoprolol succinate (TOPROL-XL) 25 MG 24 hr tablet Take 1 tablet by mouth daily.    . Multiple Vitamin (MULTIVITAMIN) tablet Take 1 tablet by mouth daily.      . nitroGLYCERIN (NITROSTAT) 0.4 MG SL tablet Place 1 tablet under the tongue as directed.    . ranitidine (ZANTAC) 150 MG tablet Take 150 mg by mouth 2 (two) times daily as needed for heartburn.    . rosuvastatin (CRESTOR) 20 MG tablet Take 1 tablet (20 mg total) by mouth as directed. Future refills request needs to go to cardiology. Crissie Sickles MD 90 tablet 3  . tadalafil (CIALIS) 5 MG tablet TAKE 1 TABLET BY MOUTH EVERY DAY AS NEEDED FOR erectile dysfunction. 90 tablet 0  . tamsulosin (FLOMAX) 0.4 MG CAPS capsule TAKE ONE CAPSULE BY MOUTH ONCE DAILY 40 capsule 9  . XARELTO 20 MG TABS tablet TAKE 1 TABLET BY MOUTH DAILY WITH SUPPER 90 tablet 3  . zolpidem (AMBIEN) 5 MG tablet Take 1 tablet (5  mg total) by mouth at bedtime as needed. for sleep 30 tablet 5   No current facility-administered medications for this visit.     Objective: BP 128/82 (BP Location: Left Arm, Patient Position: Sitting, Cuff Size: Large)   Pulse 83   Temp 97.4 F (36.3 C) (Oral)   Ht 5\' 8"  (1.727 m)   Wt 169 lb 9.6 oz (76.9 kg)   SpO2 98%   BMI 25.79 kg/m  Gen: NAD, resting comfortably CV: RRR no murmurs rubs or gallops Lungs: CTAB no crackles, wheeze, rhonchi Ext: no edema Skin: warm, dry MSK: patient with pain over lateral foot just proximal to head of metatarsal - severe pain noted. Can do toe raise. Normal motion of foot and no increased pain with resisted movement  Assessment/Plan:  Left foot pain - Plan: DG Foot Complete  Left S: Tuesday night started with left lateral midfoot pain. Y Tuesday morning and wasWalking on treadmill. Did not walk way more than usual. Rates pain as severe and makes walking hard. Can still stand up on his toes without issue.  A/P: we will get x-ray to rule out stress fracture. Pain seems to be just medial to metatarsal head though- may be more soft tissues strain. Icing trial over weekend. If not improving we will do a referral to orthopedics next week- he was made aware to call us. Can use tylenol- with xarelto really should avoid his indomethacin  Solitary pulmonary nodule S: stable 5 mm nodule in October 2017. Patient asks about repeating exam today. A/P: reviewed radiology recommendations for 12 month follow up only if high risk. Is former smoker and mom with lung cancer- we agreed to order early at physical in July    BPH associated with nocturia S: from nadir of 2.6 before finasteride PSA is down. Patient was told orginally may be able to come off flomax. He wants to trial this. He still does get some nocturia.  Lab Results  Component Value Date   PSA 0.99 05/15/2016   PSA 0.91 08/08/2015   PSA 1.16 01/30/2015  A/P: we agreed to trial off flomax- no history hospitalization for urinary retention. If he has significant urinary symptoms he will restart the flomax   Advised CPE early July  Orders Placed This Encounter  Procedures  . DG Foot Complete Left    Standing Status:   Future    Standing Expiration Date:   05/19/2018    Order Specific Question:   Reason for Exam (SYMPTOM  OR DIAGNOSIS REQUIRED)    Answer:   left lateral midfoot pain. rule out stress fracture.    Order Specific Question:   Preferred imaging location?    Answer:   [facility]    Order Specific Question:   Radiology Contrast Protocol - do NOT remove file path    Answer:   [email or web address]   Return precautions advised.  Garret Reddish, MD

## 2017-03-19 NOTE — Patient Instructions (Addendum)
Trial off flomax- if worsening urinary symptoms please restart  How about we reorder your CT at your physical which would still be sooner than the year follow up recommended by radiology  Schedule physical early July. Come fasting and we will update labs  I would ice the foot 20 minutes 4x a day over the weekend. I would try to wear tennis shoes with a lot of support. If no better by Monday- we can place a referral to orthopedics ([facility] if you would like) or sports medicine  Please go to [facility] for - located [address] across [address] from [place] - in the basement - Hours: 8:30-5:30 PM M-F. Do not need appointment.

## 2017-03-19 NOTE — Progress Notes (Signed)
Pre visit review using our clinic review tool, if applicable. No additional management support is needed unless otherwise documented below in the visit note. 

## 2017-04-09 ENCOUNTER — Other Ambulatory Visit: Payer: Self-pay | Admitting: Internal Medicine

## 2017-06-06 ENCOUNTER — Other Ambulatory Visit: Payer: Self-pay | Admitting: Family Medicine

## 2017-06-09 ENCOUNTER — Encounter: Payer: Self-pay | Admitting: Family Medicine

## 2017-06-09 ENCOUNTER — Ambulatory Visit (INDEPENDENT_AMBULATORY_CARE_PROVIDER_SITE_OTHER): Payer: Medicare Other | Admitting: Family Medicine

## 2017-06-09 ENCOUNTER — Other Ambulatory Visit: Payer: Self-pay

## 2017-06-09 VITALS — BP 122/84 | HR 72 | Temp 97.6°F | Ht 68.0 in | Wt 164.2 lb

## 2017-06-09 DIAGNOSIS — Z1159 Encounter for screening for other viral diseases: Secondary | ICD-10-CM

## 2017-06-09 DIAGNOSIS — I472 Ventricular tachycardia: Secondary | ICD-10-CM | POA: Diagnosis not present

## 2017-06-09 DIAGNOSIS — I4892 Unspecified atrial flutter: Secondary | ICD-10-CM

## 2017-06-09 DIAGNOSIS — N401 Enlarged prostate with lower urinary tract symptoms: Secondary | ICD-10-CM | POA: Diagnosis not present

## 2017-06-09 DIAGNOSIS — E785 Hyperlipidemia, unspecified: Secondary | ICD-10-CM | POA: Diagnosis not present

## 2017-06-09 DIAGNOSIS — I10 Essential (primary) hypertension: Secondary | ICD-10-CM

## 2017-06-09 DIAGNOSIS — I428 Other cardiomyopathies: Secondary | ICD-10-CM | POA: Diagnosis not present

## 2017-06-09 DIAGNOSIS — I495 Sick sinus syndrome: Secondary | ICD-10-CM

## 2017-06-09 DIAGNOSIS — I4729 Other ventricular tachycardia: Secondary | ICD-10-CM

## 2017-06-09 DIAGNOSIS — R911 Solitary pulmonary nodule: Secondary | ICD-10-CM | POA: Diagnosis not present

## 2017-06-09 DIAGNOSIS — I4891 Unspecified atrial fibrillation: Secondary | ICD-10-CM

## 2017-06-09 DIAGNOSIS — R351 Nocturia: Secondary | ICD-10-CM

## 2017-06-09 DIAGNOSIS — Z0000 Encounter for general adult medical examination without abnormal findings: Secondary | ICD-10-CM | POA: Diagnosis not present

## 2017-06-09 DIAGNOSIS — Z125 Encounter for screening for malignant neoplasm of prostate: Secondary | ICD-10-CM | POA: Diagnosis not present

## 2017-06-09 LAB — COMPREHENSIVE METABOLIC PANEL
ALBUMIN: 4.8 g/dL (ref 3.5–5.2)
ALT: 20 U/L (ref 0–53)
AST: 19 U/L (ref 0–37)
Alkaline Phosphatase: 72 U/L (ref 39–117)
BUN: 18 mg/dL (ref 6–23)
CALCIUM: 10.2 mg/dL (ref 8.4–10.5)
CHLORIDE: 102 meq/L (ref 96–112)
CO2: 29 mEq/L (ref 19–32)
Creatinine, Ser: 1.16 mg/dL (ref 0.40–1.50)
GFR: 66.03 mL/min (ref >60.00)
Glucose, Bld: 113 mg/dL — ABNORMAL HIGH (ref 70–99)
POTASSIUM: 4.1 meq/L (ref 3.5–5.1)
Sodium: 140 mEq/L (ref 135–145)
Total Bilirubin: 0.9 mg/dL (ref 0.2–1.2)
Total Protein: 7.5 g/dL (ref 6.0–8.3)

## 2017-06-09 LAB — POC URINALSYSI DIPSTICK (AUTOMATED)
BILIRUBIN UA: NEGATIVE
Glucose, UA: NEGATIVE
KETONES UA: NEGATIVE
LEUKOCYTES UA: NEGATIVE
Nitrite, UA: NEGATIVE
PH UA: 6 (ref 5.0–8.0)
Protein, UA: NEGATIVE
RBC UA: NEGATIVE
Spec Grav, UA: 1.02 (ref 1.010–1.025)
Urobilinogen, UA: 0.2 E.U./dL

## 2017-06-09 LAB — HDL CHOLESTEROL: HDL: 47.2 mg/dL (ref >39.00)

## 2017-06-09 LAB — CBC
HCT: 43.6 % (ref 39.0–52.0)
Hemoglobin: 14.9 g/dL (ref 13.0–17.0)
MCHC: 34.3 g/dL (ref 30.0–36.0)
MCV: 89.8 fl (ref 78.0–100.0)
PLATELETS: 276 10*3/uL (ref 150.0–400.0)
RBC: 4.85 Mil/uL (ref 4.22–5.81)
RDW: 13.7 % (ref 11.5–15.5)
WBC: 7.6 10*3/uL (ref 4.0–10.5)

## 2017-06-09 LAB — PSA: PSA: 1.25 ng/mL (ref 0.10–4.00)

## 2017-06-09 LAB — CHOLESTEROL, TOTAL: CHOLESTEROL: 178 mg/dL (ref 0–200)

## 2017-06-09 LAB — TRIGLYCERIDES: TRIGLYCERIDES: 167 mg/dL — AB (ref 0.0–149.0)

## 2017-06-09 LAB — LDL CHOLESTEROL, DIRECT: Direct LDL: 107 mg/dL

## 2017-06-09 MED ORDER — ZOLPIDEM TARTRATE 5 MG PO TABS: 5.0000 mg | ORAL_TABLET | Freq: Every evening | ORAL | 5 refills | Status: DC | PRN

## 2017-06-09 NOTE — Progress Notes (Signed)
Phone: [phone]  Subjective:  Patient presents today for their annual physical. Chief complaint-noted.   See problem oriented charting- ROS- full  review of systems was completed and negative including able to exercise without chest pain or shortness of breath.   The following were reviewed and entered/updated in epic: Past Medical History:  Diagnosis Date  . Abscess    right posterior neck  . Bradycardia   . CVA (cerebral vascular accident) ([place])   . Elevated LFTs   . External hemorrhoids   . GERD (gastroesophageal reflux disease)   . HTN (hypertension)   . Ischemic optic neuropathy   . Other and unspecified hyperlipidemia   . Sinoatrial node dysfunction ([facility])   . Spinal stenosis    Patient Active Problem List   Diagnosis Date Noted  . Solitary pulmonary nodule 05/26/2016    Priority: High  . CAD (coronary artery disease) 05/26/2016    Priority: High  . Nonischemic cardiomyopathy ([place]) 04/23/2016    Priority: High  . CVA (cerebral infarction) 01/30/2015    Priority: High  . Atrial fibrillation and flutter ([place]) 01/01/2015    Priority: High  . Ventricular tachycardia, non-sustained ([place]) 01/25/2013    Priority: High  . Cardiac pacemaker in situ 12/28/2009    Priority: High  . Insomnia 01/30/2015    Priority: Medium  . BPH associated with nocturia 08/14/2014    Priority: Medium  . Hyperlipidemia 07/12/2007    Priority: Medium  . NEUROPATHY, ISCHEMIC OPTIC 06/02/2007    Priority: Medium  . Essential hypertension 06/02/2007    Priority: Medium  . Left chest pressure 03/19/2015    Priority: Low  . Erectile dysfunction 01/30/2015    Priority: Low  . Former smoker 01/30/2015    Priority: Low  . Allergic rhinitis 10/11/2014    Priority: Low  . Sinoatrial node dysfunction ([facility])     Priority: Low  . SPINAL STENOSIS, LUMBAR 08/22/2010    Priority: Low  . GERD 03/17/2010    Priority: Low   Past Surgical History:  Procedure Laterality Date  . ABSCESS  DRAINAGE     righ tposterior neck  . CARDIAC CATHETERIZATION  1996  . CHOLECYSTECTOMY  02/07/2003  . COLONOSCOPY  06/23/2004  . EYE SURGERY     left eye  . left ingunial hernia  06/08/2011  . PACEMAKER INSERTION    . right hip replacement     2012 Dr. Maureen Ralphs    Family History  Problem Relation Age of Onset  . Lung cancer Mother        mets to brain  . Heart attack Mother 64  . Hypertension Mother   . Brain cancer Mother   . Heart attack Father        mid 16s, smoker.   . Diabetes Father   . Diabetes Paternal Grandfather   . Healthy Brother     Medications- reviewed and updated Current Outpatient Prescriptions  Medication Sig Dispense Refill  . benazepril (LOTENSIN) 5 MG tablet TAKE ONE TABLET BY MOUTH ONCE DAILY 90 tablet 3  . cyclobenzaprine (FLEXERIL) 5 MG tablet Take 1 tablet (5 mg total) by mouth 3 (three) times daily as needed for muscle spasms. 30 tablet 2  . finasteride (PROSCAR) 5 MG tablet TAKE ONE TABLET BY MOUTH ONCE DAILY 90 tablet 2  . fish oil-omega-3 fatty acids 1000 MG capsule Take 2 g by mouth daily. Reported on 02/18/2016    . gemfibrozil (LOPID) 600 MG tablet Take 1 tablet (600 mg total) by mouth  2 (two) times daily before a meal. 180 tablet 3  . indomethacin (INDOCIN) 25 MG capsule Take 25 mg by mouth 2 (two) times daily as needed (pain). Take with a meal. 3 days maximun    . metoprolol succinate (TOPROL-XL) 25 MG 24 hr tablet Take 1 tablet by mouth daily.    . Multiple Vitamin (MULTIVITAMIN) tablet Take 1 tablet by mouth daily.      . nitroGLYCERIN (NITROSTAT) 0.4 MG SL tablet Place 1 tablet under the tongue as directed.    . ranitidine (ZANTAC) 150 MG tablet Take 150 mg by mouth 2 (two) times daily as needed for heartburn.    . rosuvastatin (CRESTOR) 20 MG tablet Take 1 tablet (20 mg total) by mouth as directed. Future refills request needs to go to cardiology. Crissie Sickles MD 90 tablet 3  . tadalafil (CIALIS) 5 MG tablet TAKE 1 TABLET BY MOUTH EVERY DAY  AS NEEDED FOR erectile dysfunction. 90 tablet 0  . tamsulosin (FLOMAX) 0.4 MG CAPS capsule TAKE ONE CAPSULE BY MOUTH ONCE DAILY 40 capsule 9  . XARELTO 20 MG TABS tablet TAKE 1 TABLET BY MOUTH DAILY WITH SUPPER 90 tablet 3  . zolpidem (AMBIEN) 5 MG tablet Take 1 tablet (5 mg total) by mouth at bedtime as needed. for sleep 30 tablet 5   No current facility-administered medications for this visit.     Allergies-reviewed and updated No Known Allergies  Social History   Social History  . Marital status: Married    Spouse name: N/A  . Number of children: N/A  . Years of education: N/A   Social History Main Topics  . Smoking status: Former Smoker    Packs/day: 1.00    Years: 20.00    Types: Cigarettes    Quit date: 11/04/1991  . Smokeless tobacco: Never Used  . Alcohol use 0.0 oz/week     Comment: occ  . Drug use: No  . Sexual activity: Not Asked   Other Topics Concern  . None   Social History Narrative   Married. 1 child and 1 step child. 2 grandchildren.       Lawyer (part time)      Hobbies: swimming (states not fun), travel    Objective: BP 122/84 (BP Location: Left Arm, Patient Position: Sitting, Cuff Size: Large)   Pulse 72   Temp 97.6 F (36.4 C) (Oral)   Ht 5\' 8"  (1.727 m)   Wt 164 lb 3.2 oz (74.5 kg)   SpO2 96%   BMI 24.97 kg/m  Gen: NAD, resting comfortably HEENT: Mucous membranes are moist. Oropharynx normal Neck: no thyromegaly CV: RRR no murmurs rubs or gallops Lungs: CTAB no crackles, wheeze, rhonchi Abdomen: soft/nontender/nondistended/normal bowel sounds. No rebound or guarding.  Ext: no edema Skin: warm, dry Neuro: grossly normal, moves all extremities, PERRLA Rectal: normal tone, normal sized prostate, no masses or tenderness   Assessment/Plan:  71 y.o. male presenting for annual physical.  Health Maintenance counseling: 1. Anticipatory guidance: Patient counseled regarding regular dental exams - has not been going since his dentist  retired, eye exams yearly, wearing seatbelts.  2. Risk factor reduction:  Advised patient of need for regular exercise and diet rich and fruits and vegetables to reduce risk of heart attack and stroke. Exercise- started in march 2-3x a week at [facility]- think she is swimming better. Diet-reasonable- has los 5 lbs.  Wt Readings from Last 3 Encounters:  06/09/17 164 lb 3.2 oz (74.5 kg)  03/19/17 169 lb  9.6 oz (76.9 kg)  03/04/17 168 lb (76.2 kg)  3. Immunizations/screenings/ancillary studies- offered HCV screening and opts in. Discussed shingrix at pharmacy . Immunization History  Administered Date(s) Administered  . Hepatitis A, Adult 08/08/2015, 05/26/2016  . Influenza Split 11/09/2011, 09/20/2012  . Influenza Whole 11/14/2007  . Influenza, High Dose Seasonal PF 08/08/2015  . Influenza,inj,Quad PF,36+ Mos 09/14/2013, 12/27/2014  . Pneumococcal Conjugate-13 01/30/2015  . Pneumococcal Polysaccharide-23 11/09/2011  . Td 10/04/2008  4. Prostate cancer screening- BPH with nocturia on finasteride- continue to monitor yearly at this point given on finasteride perhaps to 75. On flomax as well in past- trial off last visit. Normal size prostate on finasteride Lab Results  Component Value Date   PSA 0.99 05/15/2016   PSA 0.91 08/08/2015   PSA 1.16 01/30/2015   5. Colon cancer screening - 03/04/2010 with 10 year repeat  6. Skin cancer screening- sees dermatology intermittently. One scaly spot on scalp- if grows at all by next visit consider cryotherapy if has not seen derm yet.   Status of chronic or acute concerns   Intermittent dry cough at least 6 months. Dry cough. No shortness of breath or fever. Likely due to benazepril  Follows with [facility] for multiple cardiac issues including pacemaker due to SA node dysfunction- also on metoprolol 25mg  XL,  nonischemic cardiomyopathy on benazepril, CAD- asymptomatic, a fib and flutter. He is on xarelto but not on aspirin per cardiology. He also has history  of CVA with minimal residual left sided weakness-notes at the gym. Also thought ischemic optic neruopathy. Compliant with crestor 20mg  and gemfibrozil 600mg  for hyperlipidemia but last LDL was 112 though triglycerides finally down. BP controlled on benazepril. Has had asymptomatic v tach in past non sustained- noted on pacemaker.   Insomnia controlled on prn ambien.   In April X-ray left foot orderd to rule out stress fracture but he never got this  Hematuria - at times on POC UA- urine microscopic normal last year with no hematuria  Solitary pulmonary nodule stable nodule 5 mm October 2017- suggested 12 month follow up if high risk- he prefers to do this early- will order toda  3-6 month follow up  Orders Placed This Encounter  Procedures  . CT Chest W Contrast    Standing Status:   Future    Standing Expiration Date:   08/10/2018    Order Specific Question:   If indicated for the ordered procedure, I authorize the administration of contrast media per Radiology protocol    Answer:   Yes    Order Specific Question:   Reason for Exam (SYMPTOM  OR DIAGNOSIS REQUIRED)    Answer:   pulmonary nodule october 2017    Order Specific Question:   Preferred imaging location?    Answer:   [facility]    Order Specific Question:   Radiology Contrast Protocol - do NOT remove file path    Answer:   [email or web address]  . CBC    [place]  . Comprehensive metabolic panel    [place]    Order Specific Question:   Has the patient fasted?    Answer:   No  . Hepatitis C antibody  . Cholesterol, Total  . LDL cholesterol, direct    [place]  . HDL cholesterol  . Triglycerides    Order Specific Question:   Has the patient fasted?    Answer:   No  . PSA  . POCT Urinalysis Dipstick (Automated)   Return precautions  advised.  Garret Reddish, MD

## 2017-06-09 NOTE — Assessment & Plan Note (Signed)
stable nodule 5 mm October 2017- suggested 12 month follow up if high risk- he prefers to do this early- will order toda

## 2017-06-09 NOTE — Patient Instructions (Addendum)
We will call you within a week or two about your referral to CT scan of lungs to follow up on nodule. If you do not hear within 3 weeks, give [place] a call.   Please stop by lab before you go  Consider signing up for mychart  PINK SHEET I would also like for you to sign up for an annual wellness visit with our nurse, Manuela Schwartz, who specializes in the annual wellness exam. This is a free benefit under medicare that may help [place] find additional ways to help you. Some highlights are reviewing medications, lifestyle, and doing a dementia screen.

## 2017-06-10 ENCOUNTER — Encounter: Payer: Self-pay | Admitting: Family Medicine

## 2017-06-10 DIAGNOSIS — R739 Hyperglycemia, unspecified: Secondary | ICD-10-CM

## 2017-06-10 HISTORY — DX: Hyperglycemia, unspecified: R73.9

## 2017-06-10 LAB — HEPATITIS C ANTIBODY: HCV AB: NEGATIVE

## 2017-06-18 ENCOUNTER — Other Ambulatory Visit: Payer: Medicare Other

## 2017-07-05 NOTE — Progress Notes (Signed)
Subjective:   [name] is a 71 y.o. male who presents for Medicare Annual/Subsequent preventive examination.  The Patient was informed that the wellness visit is to identify future health risk and educate and initiate measures that can reduce risk for increased disease through the lifespan.    Annual Wellness Assessment  Reports health as Is good for someone with 2 cardiologist.  Preventive Screening -Counseling & Management  Medicare Annual Preventive Care Visit - Subsequent Last OV 05/2017 Labs; PY195 PSA 1.25 Colonoscopy 02/2010 - w 10 y repeat No shingrix-educated regarding shingrix and to take this at the pharmacy. To take flu vaccine when available   Describes Health as poor, fair, good or great?  Doristine Devoid States his father died at a very young age. States the cardiologists have told him he is doing well but considers himself high risk   MED Crestor gives him some myalgia but prefers to continue to take Crestor. Educated Co Q10 and vitamin D and to follow-up with his cardiologist. Myalgias do not inhibit his exercise at this time.   Chest scan ordered for tomorrow  Cardiac cath scan found a spot on his lungs    VS reviewed;   Diet  Wife is an excellent cook; eats very well 2 meals a day Juice and coffee for breakfast Lunch; eat fish, chicken Green veg Fruits Nuts   BMI 25   Exercise Swim with a team 3 to 4 nights a week - 60 minutes Y 2 times a week 5 to 6 days a week   Dental no; may consider finding another dentist   Stressors: none now  Sleep patterns: sleeps well    Advanced Directives completed   Patient Care Team: Marin Olp, MD as PCP - General (Family Medicine) Assessed for additional providers  Immunization History  Administered Date(s) Administered  . Hepatitis A, Adult 08/08/2015, 05/26/2016  . Influenza Split 11/09/2011, 09/20/2012  . Influenza Whole 11/14/2007  . Influenza, High Dose Seasonal PF 08/08/2015  .  Influenza,inj,Quad PF,36+ Mos 09/14/2013, 12/27/2014  . Pneumococcal Conjugate-13 01/30/2015  . Pneumococcal Polysaccharide-23 11/09/2011  . Td 10/04/2008   Required Immunizations needed today  Screening test up to date or reviewed for plan of completion Health Maintenance Due  Topic Date Due  . INFLUENZA VACCINE  06/30/2017    Will take when available       Cardiac Risk Factors include: advanced age (>55men, >78 women);dyslipidemia;family history of premature cardiovascular disease;hypertension;male gender     Objective:    Vitals: BP 118/70   Pulse 67   Ht 5\' 8"  (1.727 m)   Wt 168 lb (76.2 kg)   SpO2 96%   BMI 25.54 kg/m   Body mass index is 25.54 kg/m.  Tobacco History  Smoking Status  . Former Smoker  . Packs/day: 1.00  . Years: 20.00  . Types: Cigarettes  . Quit date: 11/04/1991  Smokeless Tobacco  . Never Used     Counseling given: Yes   Past Medical History:  Diagnosis Date  . Abscess    right posterior neck  . Bradycardia   . CVA (cerebral vascular accident) ([place])   . Elevated LFTs   . External hemorrhoids   . GERD (gastroesophageal reflux disease)   . HTN (hypertension)   . Ischemic optic neuropathy   . Other and unspecified hyperlipidemia   . Sinoatrial node dysfunction ([facility])   . Spinal stenosis    Past Surgical History:  Procedure Laterality Date  . ABSCESS DRAINAGE  righ tposterior neck  . CARDIAC CATHETERIZATION  1996  . CHOLECYSTECTOMY  02/07/2003  . COLONOSCOPY  06/23/2004  . EYE SURGERY     left eye  . left ingunial hernia  06/08/2011  . PACEMAKER INSERTION    . right hip replacement     2012 Dr. Maureen Ralphs   Family History  Problem Relation Age of Onset  . Lung cancer Mother        mets to brain  . Heart attack Mother 77  . Hypertension Mother   . Brain cancer Mother   . Heart attack Father        mid 96s, smoker.   . Diabetes Father   . Diabetes Paternal Grandfather   . Healthy Brother    History  Sexual  Activity  . Sexual activity: Not on file    Outpatient Encounter Prescriptions as of 07/06/2017  Medication Sig  . benazepril (LOTENSIN) 5 MG tablet TAKE ONE TABLET BY MOUTH ONCE DAILY  . cyclobenzaprine (FLEXERIL) 5 MG tablet Take 1 tablet (5 mg total) by mouth 3 (three) times daily as needed for muscle spasms.  . finasteride (PROSCAR) 5 MG tablet TAKE ONE TABLET BY MOUTH ONCE DAILY  . fish oil-omega-3 fatty acids 1000 MG capsule Take 2 g by mouth daily. Reported on 02/18/2016  . gemfibrozil (LOPID) 600 MG tablet Take 1 tablet (600 mg total) by mouth 2 (two) times daily before a meal.  . indomethacin (INDOCIN) 25 MG capsule Take 25 mg by mouth 2 (two) times daily as needed (pain). Take with a meal. 3 days maximun  . metoprolol succinate (TOPROL-XL) 25 MG 24 hr tablet Take 1 tablet by mouth daily.  . Multiple Vitamin (MULTIVITAMIN) tablet Take 1 tablet by mouth daily.    . nitroGLYCERIN (NITROSTAT) 0.4 MG SL tablet Place 1 tablet under the tongue as directed.  . ranitidine (ZANTAC) 150 MG tablet Take 150 mg by mouth 2 (two) times daily as needed for heartburn.  . rosuvastatin (CRESTOR) 20 MG tablet Take 1 tablet (20 mg total) by mouth as directed. Future refills request needs to go to cardiology. Crissie Sickles MD  . tadalafil (CIALIS) 5 MG tablet TAKE 1 TABLET BY MOUTH EVERY DAY AS NEEDED FOR erectile dysfunction.  . tamsulosin (FLOMAX) 0.4 MG CAPS capsule TAKE ONE CAPSULE BY MOUTH ONCE DAILY  . XARELTO 20 MG TABS tablet TAKE 1 TABLET BY MOUTH DAILY WITH SUPPER  . zolpidem (AMBIEN) 5 MG tablet Take 1 tablet (5 mg total) by mouth at bedtime as needed. for sleep   No facility-administered encounter medications on file as of 07/06/2017.     Activities of Daily Living In your present state of health, do you have any difficulty performing the following activities: 07/06/2017  Hearing? Y  Vision? Y  Difficulty concentrating or making decisions? N  Walking or climbing stairs? N  Dressing or bathing?  N  Doing errands, shopping? N  Preparing Food and eating ? N  Using the Toilet? N  In the past six months, have you accidently leaked urine? N  Do you have problems with loss of bowel control? N  Managing your Medications? N  Managing your Finances? N  Housekeeping or managing your Housekeeping? N  Some recent data might be hidden    Patient Care Team: Marin Olp, MD as PCP - General (Family Medicine)   Assessment:     Exercise Activities and Dietary recommendations Current Exercise Habits: Structured exercise class, Time (Minutes): > 60, Frequency (Times/Week):  6, Weekly Exercise (Minutes/Week): 0, Intensity: Intense  Goals    . patient stated          To maintain your health       Fall Risk Fall Risk  07/06/2017 06/09/2017 05/26/2016 04/17/2014  Falls in the past year? Yes No Yes No  Comment due to vision issues  - - -  Number falls in past yr: - - 1 -  Injury with Fall? 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     Immunization History  Administered Date(s) Administered  . Hepatitis A, Adult 08/08/2015, 05/26/2016  . Influenza Split 11/09/2011, 09/20/2012  . Influenza Whole 11/14/2007  . Influenza, High Dose Seasonal PF 08/08/2015  . Influenza,inj,Quad PF,36+ Mos 09/14/2013, 12/27/2014  . Pneumococcal Conjugate-13 01/30/2015  . Pneumococcal Polysaccharide-23 11/09/2011  . Td  10/04/2008   Screening Tests Health Maintenance  Topic Date Due  . INFLUENZA VACCINE  06/30/2017  . TETANUS/TDAP  10/04/2018  . COLONOSCOPY  03/04/2020  . Hepatitis C Screening  Completed  . PNA vac Low Risk Adult  Completed      Plan:     PCP Notes   Health Maintenance Will take his flu vaccine this fall. Educated regarding shingrix. Refer to the pharmacy. Educated regarding AAA due to smoking history. It appears he had a CT of the abdomen in 2007   Abnormal Screens  States he does have vision issues due to stroke 15 years ago. States the vision in his left eye is reduced peripherally. Still has good vision with his right eye..  Discussed elevated blood sugar. Educated regarding prediabetes. Recommend add A1c at his next office visit. Blood sugar was 113 and he states this was fasting. The patient exercises 5 to 6 days a week x 1 hour; BMI 25  Also states he has hearing issues. He does have hearing aids but does not wear them. Educated on internal hearing aids and states he may check into that.  Referrals  No referrals necessary at this time  Patient concerns; Patient's is under very good medical management of his conditions.  States the cardiologist have told him that he is doing well. Still very concerned about his health impending  lung CT tomorrow  The patient states he is retiring and wants to travel and enjoy his life. Is concerned about future disability and has decided to travel as much as possible with hs wife.  Nurse Concerns; No concerns at this time. Discussed dental issues, regular cleanings or the risk for further HD   Next PCP apt April 2019    I have personally reviewed and noted the following in the patient's chart:   . Medical and social history . Use of alcohol, tobacco or illicit drugs  . Current medications and supplements . Functional ability and status . Nutritional status . Physical activity . Advanced directives . List of other  physicians . Hospitalizations, surgeries, and ER visits in previous 12 months . Vitals . Screenings to include cognitive, depression, and falls . Referrals and appointments  In addition, I have reviewed and discussed with patient certain preventive protocols, quality metrics, and best practice recommendations. A written personalized care plan for preventive services as well as general preventive health recommendations were provided to patient.     Wynetta Fines, RN  07/06/2017

## 2017-07-06 ENCOUNTER — Ambulatory Visit (INDEPENDENT_AMBULATORY_CARE_PROVIDER_SITE_OTHER): Payer: Medicare Other

## 2017-07-06 VITALS — BP 118/70 | HR 67 | Ht 68.0 in | Wt 168.0 lb

## 2017-07-06 DIAGNOSIS — Z0000 Encounter for general adult medical examination without abnormal findings: Secondary | ICD-10-CM

## 2017-07-06 NOTE — Patient Instructions (Addendum)
Mr. Brandl , Thank you for taking time to come for your Medicare Wellness Visit. I appreciate your ongoing commitment to your health goals. Please review the following plan we discussed and let me know if I can assist you in the future.   Take flu vaccine this fall!  Shingrix is a vaccine for the prevention of Shingles in Adults 50 and older.  If you are on Medicare, you can request a prescription from your doctor to be filled at a pharmacy.  Please check with your benefits regarding applicable copays or out of pocket expenses.  The Shingrix is given in 2 vaccines approx 8 weeks apart. You must receive the 2nd dose prior to 6 months from receipt of the first.    These are the goals we discussed: Goals    . patient stated          To maintain your health        This is a list of the screening recommended for you and due dates:  Health Maintenance  Topic Date Due  . Flu Shot  06/30/2017  . Tetanus Vaccine  10/04/2018  . Colon Cancer Screening  03/04/2020  .  Hepatitis C: One time screening is recommended by Center for Disease Control  (CDC) for  adults born from [birth year] through 1965.   Completed  . Pneumonia vaccines  Completed    Health Maintenance, Male A healthy lifestyle and preventive care is important for your health and wellness. Ask your health care provider about what schedule of regular examinations is right for you. What should I know about weight and diet? Eat a Healthy Diet  Eat plenty of vegetables, fruits, whole grains, low-fat dairy products, and lean protein.  Do not eat a lot of foods high in solid fats, added sugars, or salt.  Maintain a Healthy Weight Regular exercise can help you achieve or maintain a healthy weight. You should:  Do at least 150 minutes of exercise each week. The exercise should increase your heart rate and make you sweat (moderate-intensity exercise).  Do strength-training exercises at least twice a week.  Watch Your Levels of  Cholesterol and Blood Lipids  Have your blood tested for lipids and cholesterol every 5 years starting at 71 years of age. If you are at high risk for heart disease, you should start having your blood tested when you are 71 years old. You may need to have your cholesterol levels checked more often if: ? Your lipid or cholesterol levels are high. ? You are older than 71 years of age. ? You are at high risk for heart disease.  What should I know about cancer screening? Many types of cancers can be detected early and may often be prevented. Lung Cancer  You should be screened every year for lung cancer if: ? You are a current smoker who has smoked for at least 30 years. ? You are a former smoker who has quit within the past 15 years.  Talk to your health care provider about your screening options, when you should start screening, and how often you should be screened.  Colorectal Cancer  Routine colorectal cancer screening usually begins at 71 years of age and should be repeated every 5-10 years until you are 71 years old. You may need to be screened more often if early forms of precancerous polyps or small growths are found. Your health care provider may recommend screening at an earlier age if you have risk factors for colon cancer.  Your health care provider may recommend using home test kits to check for hidden blood in the stool.  A small camera at the end of a tube can be used to examine your colon (sigmoidoscopy or colonoscopy). This checks for the earliest forms of colorectal cancer.  Prostate and Testicular Cancer  Depending on your age and overall health, your health care provider may do certain tests to screen for prostate and testicular cancer.  Talk to your health care provider about any symptoms or concerns you have about testicular or prostate cancer.  Skin Cancer  Check your skin from head to toe regularly.  Tell your health care provider about any new moles or changes  in moles, especially if: ? There is a change in a mole's size, shape, or color. ? You have a mole that is larger than a pencil eraser.  Always use sunscreen. Apply sunscreen liberally and repeat throughout the day.  Protect yourself by wearing long sleeves, pants, a wide-brimmed hat, and sunglasses when outside.  What should I know about heart disease, diabetes, and high blood pressure?  If you are 75-26 years of age, have your blood pressure checked every 3-5 years. If you are 82 years of age or older, have your blood pressure checked every year. You should have your blood pressure measured twice-once when you are at a hospital or clinic, and once when you are not at a hospital or clinic. Record the average of the two measurements. To check your blood pressure when you are not at a hospital or clinic, you can use: ? An automated blood pressure machine at a pharmacy. ? A home blood pressure monitor.  Talk to your health care provider about your target blood pressure.  If you are between 79-28 years old, ask your health care provider if you should take aspirin to prevent heart disease.  Have regular diabetes screenings by checking your fasting blood sugar level. ? If you are at a normal weight and have a low risk for diabetes, have this test once every three years after the age of 24. ? If you are overweight and have a high risk for diabetes, consider being tested at a younger age or more often.  A one-time screening for abdominal aortic aneurysm (AAA) by ultrasound is recommended for men aged 85-75 years who are current or former smokers. What should I know about preventing infection? Hepatitis B If you have a higher risk for hepatitis B, you should be screened for this virus. Talk with your health care provider to find out if you are at risk for hepatitis B infection. Hepatitis C Blood testing is recommended for:  Everyone born from [birth year] through 1965.  Anyone with known risk factors  for hepatitis C.  Sexually Transmitted Diseases (STDs)  You should be screened each year for STDs including gonorrhea and chlamydia if: ? You are sexually active and are younger than 71 years of age. ? You are older than 71 years of age and your health care provider tells you that you are at risk for this type of infection. ? Your sexual activity has changed since you were last screened and you are at an increased risk for chlamydia or gonorrhea. Ask your health care provider if you are at risk.  Talk with your health care provider about whether you are at high risk of being infected with HIV. Your health care provider may recommend a prescription medicine to help prevent HIV infection.  What else can I do?  Schedule regular health, dental, and eye exams.  Stay current with your vaccines (immunizations).  Do not use any tobacco products, such as cigarettes, chewing tobacco, and e-cigarettes. If you need help quitting, ask your health care provider.  Limit alcohol intake to no more than 2 drinks per day. One drink equals 12 ounces of beer, 5 ounces of wine, or 1 ounces of hard liquor.  Do not use street drugs.  Do not share needles.  Ask your health care provider for help if you need support or information about quitting drugs.  Tell your health care provider if you often feel depressed.  Tell your health care provider if you have ever been abused or do not feel safe at home. This information is not intended to replace advice given to you by your health care provider. Make sure you discuss any questions you have with your health care provider. Document Released: 05/14/2008 Document Revised: 07/15/2016 Document Reviewed: 08/20/2015 Elsevier Interactive Patient Education  [employer].

## 2017-07-07 ENCOUNTER — Encounter: Payer: Self-pay | Admitting: Family Medicine

## 2017-07-07 ENCOUNTER — Ambulatory Visit (INDEPENDENT_AMBULATORY_CARE_PROVIDER_SITE_OTHER)
Admission: RE | Admit: 2017-07-07 | Discharge: 2017-07-07 | Disposition: A | Discharge status: Home / Self Care | Payer: Medicare Other | Source: Ambulatory Visit | Attending: Family Medicine | Admitting: Family Medicine

## 2017-07-07 DIAGNOSIS — R911 Solitary pulmonary nodule: Secondary | ICD-10-CM | POA: Diagnosis not present

## 2017-07-07 NOTE — Progress Notes (Signed)
I have reviewed and agree with note, evaluation, plan.    , MD  

## 2017-08-06 ENCOUNTER — Other Ambulatory Visit: Payer: Self-pay | Admitting: Internal Medicine

## 2017-08-06 DIAGNOSIS — E785 Hyperlipidemia, unspecified: Secondary | ICD-10-CM

## 2017-08-10 ENCOUNTER — Encounter: Payer: Self-pay | Admitting: Internal Medicine

## 2017-08-10 ENCOUNTER — Ambulatory Visit (INDEPENDENT_AMBULATORY_CARE_PROVIDER_SITE_OTHER): Payer: Medicare Other | Admitting: Internal Medicine

## 2017-08-10 VITALS — BP 128/78 | HR 78 | Ht 68.0 in | Wt 167.6 lb

## 2017-08-10 DIAGNOSIS — I4891 Unspecified atrial fibrillation: Secondary | ICD-10-CM | POA: Diagnosis not present

## 2017-08-10 DIAGNOSIS — I495 Sick sinus syndrome: Secondary | ICD-10-CM

## 2017-08-10 DIAGNOSIS — I4892 Unspecified atrial flutter: Secondary | ICD-10-CM | POA: Diagnosis not present

## 2017-08-10 LAB — CUP PACEART INCLINIC DEVICE CHECK
Implantable Lead Implant Date: 20031017
Implantable Lead Implant Date: 20031017
Implantable Lead Location: 753859
Implantable Lead Location: 753860
Implantable Lead Model: 5076
Implantable Lead Model: 5076
MDC IDC PG IMPLANT DT: 20120210
MDC IDC PG SERIAL: 66063261
MDC IDC SESS DTM: 20180911104904

## 2017-08-10 NOTE — Patient Instructions (Addendum)
Medication Instructions:  Your physician recommends that you continue on your current medications as directed. Please refer to the Current Medication list given to you today.  Labwork: None ordered.  Testing/Procedures: None ordered.  Follow-Up: Your physician wants you to follow-up in: 6 months with Dr. Lovena Le and you will have a device interrogation at that time.   You will receive a reminder letter in the mail two months in advance. If you don't receive a letter, please call our office to schedule the follow-up appointment.     Any Other Special Instructions Will Be Listed Below (If Applicable).   If you need a refill on your cardiac medications before your next appointment, please call your pharmacy.

## 2017-08-10 NOTE — Progress Notes (Signed)
HPI Mr. Divis returns today for ongoing evaluation and management of sinus node dysfunction, paroxysmal atrial arrhythmias, hypertension, and nonobstructive coronary disease on the setting of mild left ventricular dysfunction. In the interim he continues to exercise regularly swimming approximately a mile to a mile and a half 3 times a week. He also lifts weights. He is status post pacemaker insertion. No Known Allergies   Current Outpatient Prescriptions  Medication Sig Dispense Refill  . benazepril (LOTENSIN) 5 MG tablet TAKE ONE TABLET BY MOUTH ONCE DAILY 90 tablet 3  . cyclobenzaprine (FLEXERIL) 5 MG tablet Take 1 tablet (5 mg total) by mouth 3 (three) times daily as needed for muscle spasms. 30 tablet 2  . finasteride (PROSCAR) 5 MG tablet TAKE ONE TABLET BY MOUTH ONCE DAILY 90 tablet 2  . fish oil-omega-3 fatty acids 1000 MG capsule Take 2 g by mouth daily. Reported on 02/18/2016    . gemfibrozil (LOPID) 600 MG tablet TAKE ONE TABLET BY MOUTH TWICE DAILY BEFORE A MEAL(S). 180 tablet 3  . indomethacin (INDOCIN) 25 MG capsule Take 25 mg by mouth 2 (two) times daily as needed (pain). Take with a meal. 3 days maximun    . metoprolol succinate (TOPROL-XL) 25 MG 24 hr tablet Take 1 tablet by mouth daily.    . Multiple Vitamin (MULTIVITAMIN) tablet Take 1 tablet by mouth daily.      . ranitidine (ZANTAC) 150 MG tablet Take 150 mg by mouth 2 (two) times daily as needed for heartburn.    . rosuvastatin (CRESTOR) 20 MG tablet Take 1 tablet (20 mg total) by mouth as directed. Future refills request needs to go to cardiology. Crissie Sickles MD 90 tablet 3  . tadalafil (CIALIS) 5 MG tablet TAKE 1 TABLET BY MOUTH EVERY DAY AS NEEDED FOR erectile dysfunction. 90 tablet 0  . tamsulosin (FLOMAX) 0.4 MG CAPS capsule TAKE ONE CAPSULE BY MOUTH ONCE DAILY 40 capsule 9  . XARELTO 20 MG TABS tablet TAKE 1 TABLET BY MOUTH DAILY WITH SUPPER 90 tablet 3  . zolpidem (AMBIEN) 5 MG tablet Take 1 tablet (5 mg  total) by mouth at bedtime as needed. for sleep 30 tablet 5   No current facility-administered medications for this visit.      Past Medical History:  Diagnosis Date  . Abscess    right posterior neck  . Bradycardia   . CVA (cerebral vascular accident) ([place])   . Elevated LFTs   . External hemorrhoids   . GERD (gastroesophageal reflux disease)   . HTN (hypertension)   . Ischemic optic neuropathy   . Other and unspecified hyperlipidemia   . Sinoatrial node dysfunction (HCC)   . Spinal stenosis     ROS:   All systems reviewed and negative except as noted in the HPI.   Past Surgical History:  Procedure Laterality Date  . ABSCESS DRAINAGE     righ tposterior neck  . CARDIAC CATHETERIZATION  1996  . CHOLECYSTECTOMY  02/07/2003  . COLONOSCOPY  06/23/2004  . EYE SURGERY     left eye  . left ingunial hernia  06/08/2011  . PACEMAKER INSERTION    . right hip replacement     2012 Dr. Maureen Ralphs     Family History  Problem Relation Age of Onset  . Lung cancer Mother        mets to brain  . Heart attack Mother 57  . Hypertension Mother   . Brain cancer Mother   .  Heart attack Father        mid 85s, smoker.   . Diabetes Father   . Diabetes Paternal Grandfather   . Healthy Brother      Social History   Social History  . Marital status: Married    Spouse name: N/A  . Number of children: N/A  . Years of education: N/A   Occupational History  . Not on file.   Social History Main Topics  . Smoking status: Former Smoker    Packs/day: 1.00    Years: 20.00    Types: Cigarettes    Quit date: 11/04/1991  . Smokeless tobacco: Never Used  . Alcohol use 0.0 oz/week     Comment: occ  . Drug use: No  . Sexual activity: Not on file   Other Topics Concern  . Not on file   Social History Narrative   Married. 1 child and 1 step child. 2 grandchildren.       Lawyer (part time)      Hobbies: swimming (states not fun), travel     BP 128/78   Pulse 78   Ht 5\' 8"   (1.727 m)   Wt 167 lb 9.6 oz (76 kg)   SpO2 95%   BMI 25.48 kg/m   Physical Exam:  Well appearing 71 year old man, NAD HEENT: Unremarkable Neck:  6 cm JVD, no thyromegally Lymphatics:  No adenopathy Back:  No CVA tenderness Lungs:  Clear, with no wheezes, rales, or rhonchi HEART:  Regular rate rhythm, no murmurs, no rubs, no clicks Abd:  soft, positive bowel sounds, no organomegally, no rebound, no guarding Ext:  2 plus pulses, no edema, no cyanosis, no clubbing Skin:  No rashes no nodules Neuro:  CN II through XII intact, motor grossly intact   DEVICE  Normal device function.  See PaceArt for details.   Assess/Plan: 1. Sinus node dysfunction - he is doing well status post permanent pacemaker insertion. 2. Pacemaker - his Biotronik dual-chamber pacemaker is working satisfactorily. His atrial pacing essentially all the time. His ventricular threshold in the unipolar mode is stable. 3. Paroxysmal atrial arrhythmias - he will continue systemic anticoagulation. We discussed the possibility of discontinuing his oral anticoagulants, but because the nature of his atrial arrhythmias is variable, I would recommend against it. 4. Coronary artery disease - CT scans have been noted. He does not have anginal symptom or at least no limiting anginal symptoms at the current time. 5. Dyslipidemia - he will continue his statin therapy. He denies any evidence of myopathy.  Cristopher Peru, M.D.

## 2017-08-30 ENCOUNTER — Other Ambulatory Visit: Payer: Self-pay | Admitting: Internal Medicine

## 2017-08-30 DIAGNOSIS — I4891 Unspecified atrial fibrillation: Secondary | ICD-10-CM

## 2017-08-30 DIAGNOSIS — I4892 Unspecified atrial flutter: Principal | ICD-10-CM

## 2017-09-06 ENCOUNTER — Encounter: Payer: Self-pay | Admitting: Internal Medicine

## 2017-09-06 NOTE — Telephone Encounter (Signed)
Spoke with Mr. Aiello- he reports that a couple of nights ago the "pounding" lasted about 10 minutes without dizziness/lightheaded feeling. He says that it feels like a thumping a dog makes when it is scratching. He stopped the episode last night by pounding on his chest a few times, so it did not last as . He is leaving for a trip next Monday so he will come to the device clinic Wednesday at 2:30pm for a PPM check. Industry has been made aware.

## 2017-09-08 ENCOUNTER — Ambulatory Visit (INDEPENDENT_AMBULATORY_CARE_PROVIDER_SITE_OTHER): Payer: Medicare Other | Admitting: *Deleted

## 2017-09-08 DIAGNOSIS — I495 Sick sinus syndrome: Secondary | ICD-10-CM | POA: Diagnosis not present

## 2017-09-08 LAB — CUP PACEART INCLINIC DEVICE CHECK
Date Time Interrogation Session: 20181010153332
Implantable Lead Implant Date: 20031017
Implantable Lead Model: 5076
Implantable Lead Model: 5076
Implantable Pulse Generator Implant Date: 20120210
MDC IDC LEAD IMPLANT DT: 20031017
MDC IDC LEAD LOCATION: 753859
MDC IDC LEAD LOCATION: 753860
MDC IDC PG SERIAL: 66063261

## 2017-09-08 NOTE — Progress Notes (Signed)
Device checked by industry d/t pt c/o of a "vibration in his chest coming from his pacemaker" Per industry sensation could be unipolar ventricular  automatic threshold. Pt only VP 2%, automatic threshold programmed off and  output fixed at 2.4V. Ventricular threshold 1.1V@0 .68ms. ROV 01/2018 w/ GT.

## 2017-10-15 ENCOUNTER — Other Ambulatory Visit: Payer: Self-pay | Admitting: Family Medicine

## 2017-10-15 DIAGNOSIS — N401 Enlarged prostate with lower urinary tract symptoms: Secondary | ICD-10-CM

## 2017-10-15 DIAGNOSIS — R351 Nocturia: Principal | ICD-10-CM

## 2018-01-26 ENCOUNTER — Encounter: Payer: Self-pay | Admitting: Internal Medicine

## 2018-02-22 ENCOUNTER — Other Ambulatory Visit: Payer: Self-pay

## 2018-02-22 MED ORDER — ZOLPIDEM TARTRATE 5 MG PO TABS: 5.0000 mg | ORAL_TABLET | Freq: Every evening | ORAL | 5 refills | Status: DC | PRN

## 2018-02-23 ENCOUNTER — Other Ambulatory Visit: Payer: Self-pay | Admitting: Family Medicine

## 2018-02-23 NOTE — Telephone Encounter (Signed)
Called and spoke to [name] at [facility] told her Rx was faxed over yesterday for Ambien. [name] looked and she said they did not receive [name]. Told her thank you.

## 2018-02-23 NOTE — Telephone Encounter (Signed)
See note

## 2018-02-23 NOTE — Telephone Encounter (Signed)
Copied from [place]. Topic: Quick Communication - Rx Refill/Question >> Feb 23, 2018 11:35 AM Oliver Pila B wrote: Medication: zolpidem (AMBIEN) 5 MG tablet [[id number]]  Has the patient contacted their pharmacy? Yes.   (Agent: If no, request that the patient contact the pharmacy for the refill.) Preferred Pharmacy (with phone number or street name): [facility] Agent: Please be advised that RX refills may take up to 3 business days. We ask that you follow-up with your pharmacy.

## 2018-02-23 NOTE — Telephone Encounter (Signed)
Spoke with patients wife to let her know that Dr. Yong Channel is out of the office this afternoon. They will send prescription to the pharmacy tomorrow.

## 2018-02-23 NOTE — Telephone Encounter (Signed)
LOV  06/09/17 Dr. Grier Mitts

## 2018-02-25 ENCOUNTER — Ambulatory Visit: Payer: Medicare Other | Admitting: Internal Medicine

## 2018-02-25 ENCOUNTER — Encounter: Payer: Self-pay | Admitting: Internal Medicine

## 2018-02-25 VITALS — BP 100/74 | HR 82 | Ht 68.0 in | Wt 169.0 lb

## 2018-02-25 DIAGNOSIS — I495 Sick sinus syndrome: Secondary | ICD-10-CM | POA: Diagnosis not present

## 2018-02-25 DIAGNOSIS — I4891 Unspecified atrial fibrillation: Secondary | ICD-10-CM

## 2018-02-25 DIAGNOSIS — I4892 Unspecified atrial flutter: Secondary | ICD-10-CM | POA: Diagnosis not present

## 2018-02-25 DIAGNOSIS — Z950 Presence of cardiac pacemaker: Secondary | ICD-10-CM | POA: Diagnosis not present

## 2018-02-25 LAB — CUP PACEART INCLINIC DEVICE CHECK
Implantable Lead Implant Date: 20031017
Implantable Lead Location: 753859
Implantable Lead Location: 753860
Implantable Lead Model: 5076
MDC IDC LEAD IMPLANT DT: 20031017
MDC IDC PG IMPLANT DT: 20120210
MDC IDC SESS DTM: 20190329151313
Pulse Gen Serial Number: 66063261

## 2018-02-25 MED ORDER — GEMFIBROZIL 600 MG PO TABS: ORAL_TABLET | ORAL | 3 refills | Status: DC

## 2018-02-25 NOTE — Progress Notes (Signed)
HPI Mr. Karnes returns today for followup of paroxysmal atrial arrhythmias, sinus node dysfunction, s/p PPM insertion, moderate, non-obstructive CAD, LV dysfunction, and dyslipidemia. In the interim, the patient has been traveling extensively, recently returning from [place]. He remains active, but has had no exertional chest pressure. He is not been exercising much because of his travel schedule. He is leaving in a month for [place].  No Known Allergies   Current Outpatient Medications  Medication Sig Dispense Refill  . benazepril (LOTENSIN) 5 MG tablet TAKE ONE TABLET BY MOUTH ONCE DAILY 90 tablet 3  . cyclobenzaprine (FLEXERIL) 5 MG tablet Take 1 tablet (5 mg total) by mouth 3 (three) times daily as needed for muscle spasms. 30 tablet 2  . finasteride (PROSCAR) 5 MG tablet TAKE ONE TABLET BY MOUTH ONCE DAILY 90 tablet 2  . fish oil-omega-3 fatty acids 1000 MG capsule Take 2 g by mouth daily. Reported on 02/18/2016    . indomethacin (INDOCIN) 25 MG capsule Take 25 mg by mouth 2 (two) times daily as needed (pain). Take with a meal. 3 days maximun    . metoprolol succinate (TOPROL-XL) 25 MG 24 hr tablet Take 1 tablet by mouth daily.    . Multiple Vitamin (MULTIVITAMIN) tablet Take 1 tablet by mouth daily.      . ranitidine (ZANTAC) 150 MG tablet Take 150 mg by mouth 2 (two) times daily as needed for heartburn.    . rosuvastatin (CRESTOR) 20 MG tablet Take 1 tablet (20 mg total) by mouth as directed. Future refills request needs to go to cardiology. Crissie Sickles MD 90 tablet 3  . tadalafil (CIALIS) 5 MG tablet TAKE 1 TABLET BY MOUTH EVERY DAY AS NEEDED FOR erectile dysfunction. 90 tablet 0  . tamsulosin (FLOMAX) 0.4 MG CAPS capsule TAKE ONE CAPSULE BY MOUTH ONCE DAILY. 50 capsule 7  . XARELTO 20 MG TABS tablet TAKE ONE TABLET BY MOUTH ONCE DAILY SUPPER 90 tablet 3  . zolpidem (AMBIEN) 5 MG tablet Take 1 tablet (5 mg total) by mouth at bedtime as needed. for sleep 30 tablet 5  .  gemfibrozil (LOPID) 600 MG tablet TAKE ONE TABLET BY MOUTH TWICE DAILY BEFORE A MEAL(S). 180 tablet 3   No current facility-administered medications for this visit.      Past Medical History:  Diagnosis Date  . Abscess    right posterior neck  . Bradycardia   . CVA (cerebral vascular accident) ([place])   . Elevated LFTs   . External hemorrhoids   . GERD (gastroesophageal reflux disease)   . HTN (hypertension)   . Ischemic optic neuropathy   . Other and unspecified hyperlipidemia   . Sinoatrial node dysfunction (HCC)   . Spinal stenosis     ROS:   All systems reviewed and negative except as noted in the HPI.   Past Surgical History:  Procedure Laterality Date  . ABSCESS DRAINAGE     righ tposterior neck  . CARDIAC CATHETERIZATION  1996  . CHOLECYSTECTOMY  02/07/2003  . COLONOSCOPY  06/23/2004  . EYE SURGERY     left eye  . left ingunial hernia  06/08/2011  . PACEMAKER INSERTION    . right hip replacement     2012 Dr. Maureen Ralphs     Family History  Problem Relation Age of Onset  . Lung cancer Mother        mets to brain  . Heart attack Mother 63  . Hypertension Mother   . Brain  cancer Mother   . Heart attack Father        mid 29s, smoker.   . Diabetes Father   . Diabetes Paternal Grandfather   . Healthy Brother      Social History   Socioeconomic History  . Marital status: Married    Spouse name: Not on file  . Number of children: Not on file  . Years of education: Not on file  . Highest education level: Not on file  Occupational History  . Not on file  Social Needs  . Financial resource strain: Not on file  . Food insecurity:    Worry: Not on file    Inability: Not on file  . Transportation needs:    Medical: Not on file    Non-medical: Not on file  Tobacco Use  . Smoking status: Former Smoker    Packs/day: 1.00    Years: 20.00    Pack years: 20.00    Types: Cigarettes    Last attempt to quit: 11/04/1991    Years since quitting: 26.3  .  Smokeless tobacco: Never Used  Substance and Sexual Activity  . Alcohol use: Yes    Alcohol/week: 0.0 oz    Comment: occ  . Drug use: No  . Sexual activity: Not on file  Lifestyle  . Physical activity:    Days per week: Not on file    Minutes per session: Not on file  . Stress: Not on file  Relationships  . Social connections:    Talks on phone: Not on file    Gets together: Not on file    Attends religious service: Not on file    Active member of club or organization: Not on file    Attends meetings of clubs or organizations: Not on file    Relationship status: Not on file  . Intimate partner violence:    Fear of current or ex partner: Not on file    Emotionally abused: Not on file    Physically abused: Not on file    Forced sexual activity: Not on file  Other Topics Concern  . Not on file  Social History Narrative   Married. 1 child and 1 step child. 2 grandchildren.       Lawyer (part time)      Hobbies: swimming (states not fun), travel     BP 100/74   Pulse 82   Ht 5\' 8"  (1.727 m)   Wt 169 lb (76.7 kg)   SpO2 98%   BMI 25.70 kg/m   Physical Exam:  Well appearing 72 yo woman, NAD HEENT: Unremarkable Neck:  6 cm JVD, no thyromegally Lymphatics:  No adenopathy Back:  No CVA tenderness Lungs:  Clear with no wheezes HEART:  Regular rate rhythm, no murmurs, no rubs, no clicks Abd:  soft, positive bowel sounds, no organomegally, no rebound, no guarding Ext:  2 plus pulses, no edema, no cyanosis, no clubbing Skin:  No rashes no nodules Neuro:  CN II through XII intact, motor grossly intact  EKG - nsr with atrial pacing  DEVICE  Normal device function.  See PaceArt for details.  Assess/Plan: 1. Sinus node dysfunction - he is asymptomatic s/p PPM insertion. He has no underlying rhythm today. 2. PPM - his biotronik DDD PM is working normally. We will recheck in several months. 3. CAD - he denies anginal symptoms. He will continue his current meds. 4.  Dyslipidemia - he will continue his statin therapy as well as Lopid.  [name].D.

## 2018-02-25 NOTE — Patient Instructions (Addendum)
Medication Instructions:  Your physician recommends that you continue on your current medications as directed. Please refer to the Current Medication list given to you today.  Labwork: None ordered.  Testing/Procedures: None ordered.  Follow-Up:  Your physician wants you to follow-up in: 6 months with Dr. Lovena Le.   Your physician wants you to follow-up in: one year with Dr. Lovena Le.   You will receive a reminder letter in the mail two months in advance. If you don't receive a letter, please call our office to schedule the follow-up appointment.  Any Other Special Instructions Will Be Listed Below (If Applicable).  If you need a refill on your cardiac medications before your next appointment, please call your pharmacy.

## 2018-02-28 ENCOUNTER — Ambulatory Visit: Payer: Medicare Other | Admitting: Family Medicine

## 2018-02-28 ENCOUNTER — Other Ambulatory Visit: Payer: Self-pay | Admitting: Family Medicine

## 2018-02-28 ENCOUNTER — Encounter: Payer: Self-pay | Admitting: Family Medicine

## 2018-02-28 VITALS — BP 122/86 | HR 66 | Temp 97.5°F | Ht 68.0 in | Wt 169.0 lb

## 2018-02-28 DIAGNOSIS — K219 Gastro-esophageal reflux disease without esophagitis: Secondary | ICD-10-CM

## 2018-02-28 DIAGNOSIS — M65331 Trigger finger, right middle finger: Secondary | ICD-10-CM | POA: Diagnosis not present

## 2018-02-28 DIAGNOSIS — J301 Allergic rhinitis due to pollen: Secondary | ICD-10-CM

## 2018-02-28 MED ORDER — FINASTERIDE 5 MG PO TABS: 5.0000 mg | ORAL_TABLET | Freq: Every day | ORAL | 3 refills | Status: DC

## 2018-02-28 NOTE — Assessment & Plan Note (Signed)
S:Sore throat mild. Was worse when in [place]- in end of summer there. No sneezing, watery itchy eyes. Very mild dry cough at times- benazepril. Blowing nose a few times an hour. 2 months of symptoms A/P: this could be coming from allergies (trial flonase- has used nasal steroid and allegra in past- may need to add allegra back if not already taking) or reflux (trial zantac). Check back in when he comes back in for evaluation and potential removal of lesion on neck

## 2018-02-28 NOTE — Progress Notes (Signed)
Subjective:  [name] is a 72 y.o. year old very pleasant male patient who presents for/with See problem oriented charting ROS- right middle finger pain, lesion on right side of neck.  Having some congestion and mild sore throat. Acid reflux.  Past Medical History-  Patient Active Problem List   Diagnosis Date Noted  . CAD (coronary artery disease) 05/26/2016    Priority: High  . Nonischemic cardiomyopathy ([place]) 04/23/2016    Priority: High  . CVA (cerebral infarction) 01/30/2015    Priority: High  . Atrial fibrillation and flutter ([place]) 01/01/2015    Priority: High  . Ventricular tachycardia, non-sustained ([place]) 01/25/2013    Priority: High  . Cardiac pacemaker in situ 12/28/2009    Priority: High  . Hyperglycemia 06/10/2017    Priority: Medium  . Insomnia 01/30/2015    Priority: Medium  . BPH associated with nocturia 08/14/2014    Priority: Medium  . Hyperlipidemia 07/12/2007    Priority: Medium  . NEUROPATHY, ISCHEMIC OPTIC 06/02/2007    Priority: Medium  . Essential hypertension 06/02/2007    Priority: Medium  . Solitary pulmonary nodule 05/26/2016    Priority: Low  . Left chest pressure 03/19/2015    Priority: Low  . Erectile dysfunction 01/30/2015    Priority: Low  . Former smoker 01/30/2015    Priority: Low  . Allergic rhinitis 10/11/2014    Priority: Low  . Sinoatrial node dysfunction (HCC)     Priority: Low  . SPINAL STENOSIS, LUMBAR 08/22/2010    Priority: Low  . GERD 03/17/2010    Priority: Low    Medications- reviewed and updated Current Outpatient Medications  Medication Sig Dispense Refill  . zolpidem (AMBIEN) 5 MG tablet Take by mouth.    . benazepril (LOTENSIN) 5 MG tablet TAKE ONE TABLET BY MOUTH ONCE DAILY 90 tablet 3  . cyclobenzaprine (FLEXERIL) 5 MG tablet Take 1 tablet (5 mg total) by mouth 3 (three) times daily as needed for muscle spasms. 30 tablet 2  . finasteride (PROSCAR) 5 MG tablet TAKE 1 TABLET BY MOUTH ONCE DAILY 90  tablet 2  . finasteride (PROSCAR) 5 MG tablet Take 1 tablet (5 mg total) by mouth daily. 90 tablet 3  . fish oil-omega-3 fatty acids 1000 MG capsule Take 2 g by mouth daily. Reported on 02/18/2016    . gemfibrozil (LOPID) 600 MG tablet TAKE ONE TABLET BY MOUTH TWICE DAILY BEFORE A MEAL(S). 180 tablet 3  . indomethacin (INDOCIN) 25 MG capsule Take 25 mg by mouth 2 (two) times daily as needed (pain). Take with a meal. 3 days maximun    . metoprolol succinate (TOPROL-XL) 25 MG 24 hr tablet Take 1 tablet by mouth daily.    Marland Kitchen moxifloxacin (VIGAMOX) 0.5 % ophthalmic solution   0  . Multiple Vitamin (MULTIVITAMIN) tablet Take 1 tablet by mouth daily.      . prednisoLONE acetate (PRED FORTE) 1 % ophthalmic suspension   0  . PROLENSA 0.07 % SOLN   0  . ranitidine (ZANTAC) 150 MG tablet Take 150 mg by mouth 2 (two) times daily as needed for heartburn.    . rosuvastatin (CRESTOR) 20 MG tablet Take 1 tablet (20 mg total) by mouth as directed. Future refills request needs to go to cardiology. Crissie Sickles MD 90 tablet 3  . tadalafil (CIALIS) 5 MG tablet TAKE 1 TABLET BY MOUTH EVERY DAY AS NEEDED FOR erectile dysfunction. 90 tablet 0  . tamsulosin (FLOMAX) 0.4 MG CAPS capsule TAKE ONE  CAPSULE BY MOUTH ONCE DAILY. 50 capsule 7  . XARELTO 20 MG TABS tablet TAKE ONE TABLET BY MOUTH ONCE DAILY SUPPER 90 tablet 3   No current facility-administered medications for this visit.     Objective: BP 122/86 (BP Location: Left Arm, Patient Position: Sitting, Cuff Size: Normal)   Pulse 66   Temp (!) 97.5 F (36.4 C) (Oral)   Ht 5\' 8"  (1.727 m)   Wt 169 lb (76.7 kg)   SpO2 97%   BMI 25.70 kg/m  Gen: NAD, resting comfortably CV: RRR no murmurs rubs or gallops Lungs: CTAB no crackles, wheeze, rhonchi Abdomen: soft/nontender/nondistended/normal bowel sounds. No rebound or guarding.  Ext: no edema Skin: warm, dry, a few mm wide and 5 mm long raised lesion with scally almost horned top on right neck-    Assessment/Plan:  Very Rarely feeling stabbing pain in LUQ 3-4 x over a few months. Pebble poop rather firm. Reassuring abdominal exam though no pain today. Discussed could be gas related with his constipation and advised improved h20 intake, exercise, and veggie intake.   Raised lesion right neck- will return to shave this lesion of and send for lesion.   Trigger middle finger of right hand - Plan: Ambulatory referral to Sports Medicine S: right ring finger "triggering" sensation- when he bends it - it gets locked and he has a hard time extending it. Started all of a sudden when he woke up one day. About 1.5 months ago. Some pain from it.  A/P: have to avoid nsaids given xarelto use (although does rarely use for lumbar spinal stenosis). Offered prednisone and splint trial- he is skeptical this may help. Discussed injection- he prefers this route nad referred to Dr. Paulla Fore of sports medicine  GERD S: When he takes nexium otc he has no acid reflux. He has acid reflux/indigestion any day he misses the nexium.  A/P: discussion today about risks/benefits of PPI and associated risks vs. Causal links (most issues are associated to PPIs not causal). He thought nexium was lower risk than omeprazole and I discussed both have similar risks. We discussed zantac alternative- he may trial this.   Allergic rhinitis S:Sore throat mild. Was worse when in [place]- in end of summer there. No sneezing, watery itchy eyes. Very mild dry cough at times- benazepril. Blowing nose a few times an hour. 2 months of symptoms A/P: this could be coming from allergies (trial flonase- has used nasal steroid and allegra in past- may need to add allegra back if not already taking) or reflux (trial zantac). Check back in when he comes back in for evaluation and potential removal of lesion on neck  Future Appointments  Date Time Provider [place]  03/01/2018  9:40 AM Gerda Diss, DO [facility]  03/07/2018  3:45 PM  Marin Olp, MD [facility]  08/17/2018  1:45 PM Evans Lance, MD [facility]   No follow-ups on file.  Lab/Order associations: Trigger middle finger of right hand - Plan: Ambulatory referral to Sports Medicine  Gastroesophageal reflux disease without esophagitis  Seasonal allergic rhinitis due to pollen  Meds ordered this encounter  Medications  . finasteride (PROSCAR) 5 MG tablet    Sig: Take 1 tablet (5 mg total) by mouth daily.    Dispense:  90 tablet    Refill:  3    Return precautions advised.  Garret Reddish, MD

## 2018-02-28 NOTE — Assessment & Plan Note (Signed)
S: When he takes nexium otc he has no acid reflux. He has acid reflux/indigestion any day he misses the nexium.  A/P: discussion today about risks/benefits of PPI and associated risks vs. Causal links (most issues are associated to PPIs not causal). He thought nexium was lower risk than omeprazole and I discussed both have similar risks. We discussed zantac alternative- he may trial this.

## 2018-02-28 NOTE — Patient Instructions (Addendum)
Go ahead and schedule a visit with Dr. Paulla Fore for tomorrow morning before you leave. I entered a referral for you today.   Another option to trial for reflux with lower side effect profile is ranitidine/zantac or pepcid  Schedule sometime in the next month for procedure visit to remove the lesion on the neck. Needs to be a 30 minute visit for schedulers  Try flonase over the counter to see if that helps with itchy throat and blowing nose  Lotion back everyday between now and next visit

## 2018-03-01 ENCOUNTER — Ambulatory Visit: Payer: Medicare Other | Admitting: Sports Medicine

## 2018-03-01 ENCOUNTER — Other Ambulatory Visit: Payer: Self-pay

## 2018-03-01 ENCOUNTER — Ambulatory Visit: Payer: Self-pay

## 2018-03-01 ENCOUNTER — Encounter: Payer: Self-pay | Admitting: Sports Medicine

## 2018-03-01 VITALS — BP 118/78 | HR 65 | Ht 68.0 in | Wt 173.0 lb

## 2018-03-01 DIAGNOSIS — M65331 Trigger finger, right middle finger: Secondary | ICD-10-CM

## 2018-03-01 HISTORY — DX: Trigger finger, right middle finger: M65.331

## 2018-03-01 MED ORDER — ZOLPIDEM TARTRATE 5 MG PO TABS: 5.0000 mg | ORAL_TABLET | Freq: Every day | ORAL | 5 refills | Status: DC

## 2018-03-01 NOTE — Progress Notes (Signed)
  [name]. , [place] [facility] [phone]  [name] - 72 y.o. male MRN [id number]  Date of birth: December 17, 1945  Visit Date: 03/01/2018  PCP: Marin Olp, MD   Referred by: Marin Olp, MD  Scribe for today's visit: Josepha Pigg, CMA     SUBJECTIVE:  [name] is here for New Patient (Initial Visit) (trigger middle finger, R hand) .  Referred by: Dr. Garret Reddish His R middle finger symptoms INITIALLY: Began about 1.5 mos ago Described as mild pain and catching, nonradiating Worsened with making a fist. He is unable to completely bend the middle finger.  Improved with rest.  Additional associated symptoms include: He has some discomfort with palpation around the knuckle. There is mild swelling present.     At this time symptoms show no change compared to onset  He has not taken anything OTC, he does not take NSAIDs because he is on Xarelto.   ROS Denies night time disturbances. Denies fevers, chills, or night sweats. Denies unexplained weight loss. Denies personal history of cancer. Denies changes in bowel or bladder habits. Denies recent unreported falls. Denies new or worsening dyspnea or wheezing. Denies headaches or dizziness.  Denies numbness, tingling or weakness  In the extremities.  Denies dizziness or presyncopal episodes Denies lower extremity edema    HISTORY & PERTINENT PRIOR DATA:  Prior History reviewed and updated per electronic medical record.  Significant/pertinent history, findings, studies include:  reports that he quit smoking about 26 years ago. His smoking use included cigarettes. He has a 20.00 pack-year smoking history. He has never used smokeless tobacco. No results for input(s): HGBA1C, LABURIC, CREATINE in the last 8760 hours. No specialty comments available. No problems updated.  OBJECTIVE:  VS:  HT:5\' 8"  (172.7 cm)   WT:173 lb (78.5 kg)  BMI:26.31     BP:118/78  HR:65bpm  TEMP: ( )  RESP:98 %   PHYSICAL EXAM: Constitutional: WDWN, Non-toxic appearing. Psychiatric: Alert & appropriately interactive.  Not depressed or anxious appearing. Respiratory: No increased work of breathing.  Trachea Midline Eyes: Pupils are equal.  EOM intact without nystagmus.  No scleral icterus  Vascular Exam: warm to touch no edema  upper extremity neuro exam: unremarkable  MSK Exam: Triggering with pain with palpation of the right third finger at the A1 pulley.   ASSESSMENT & PLAN:   1. Trigger finger, right middle finger     PLAN: Ultrasound-guided tendon sheath injection performed today  Follow-up: Return in about 6 weeks (around 04/12/2018).        Please see additional documentation for Objective, Assessment and Plan sections. Pertinent additional documentation may be included in corresponding procedure notes, imaging studies, problem based documentation and patient instructions. Please see these sections of the encounter for additional information regarding this visit.  CMA/ATC served as Education administrator during this visit. History, Physical, and Plan performed by medical provider. Documentation and orders reviewed and attested to.      Gerda Diss, [facility] Physician

## 2018-03-01 NOTE — Patient Instructions (Signed)

## 2018-03-01 NOTE — Procedures (Signed)
PROCEDURE NOTE:  Ultrasound Guided: Injection: Right hand, 3rd finger flexor tendon sheath Images were obtained and interpreted by myself, Teresa Coombs, DO  Images have been saved and stored to PACS system. Images obtained on: GE S7 Ultrasound machine    ULTRASOUND FINDINGS:  Swelling and triggering of the flexor tendon with enlargement of the A1 pulley  DESCRIPTION OF PROCEDURE:  The patient's clinical condition is marked by substantial pain and/or significant functional disability. Other conservative therapy has not provided relief, is contraindicated, or not appropriate. There is a reasonable likelihood that injection will significantly improve the patient's pain and/or functional impairment.   After discussing the risks, benefits and expected outcomes of the injection and all questions were reviewed and answered, the patient wished to undergo the above named procedure.  Verbal consent was obtained.  The ultrasound was used to identify the target structure and adjacent neurovascular structures. The skin was then prepped in sterile fashion and the target structure was injected under direct visualization using sterile technique as below:  PREP: Alcohol and Ethel Chloride APPROACH: direct, single injection, 25g 1.5 in. INJECTATE: 0.5 cc 1% lidocaine, 0.5 cc 0.5% Marcaine and 0.5 cc 40mg /mL DepoMedrol ASPIRATE: None DRESSING: Band-Aid  Post procedural instructions including recommending icing and warning signs for infection were reviewed.    This procedure was well tolerated and there were no complications.   IMPRESSION: Succesful Ultrasound Guided: Injection

## 2018-03-07 ENCOUNTER — Encounter: Payer: Self-pay | Admitting: Family Medicine

## 2018-03-07 ENCOUNTER — Ambulatory Visit: Payer: Medicare Other | Admitting: Family Medicine

## 2018-03-07 VITALS — BP 120/72 | HR 70 | Temp 98.7°F | Ht 68.0 in | Wt 168.4 lb

## 2018-03-07 DIAGNOSIS — J301 Allergic rhinitis due to pollen: Secondary | ICD-10-CM | POA: Diagnosis not present

## 2018-03-07 DIAGNOSIS — K219 Gastro-esophageal reflux disease without esophagitis: Secondary | ICD-10-CM

## 2018-03-07 DIAGNOSIS — L570 Actinic keratosis: Secondary | ICD-10-CM | POA: Diagnosis not present

## 2018-03-07 MED ORDER — METOPROLOL SUCCINATE ER 25 MG PO TB24: 25.0000 mg | ORAL_TABLET | Freq: Every day | ORAL | 3 refills | Status: DC

## 2018-03-07 NOTE — Assessment & Plan Note (Signed)
S: He has continued allegra and started flonase more regularly. Some mild relief so far A/P: encouraged continued use of medication- both flonase and allegra until allergy season calms down some

## 2018-03-07 NOTE — Assessment & Plan Note (Signed)
S: at last visit we discussed trial of zantac over omeprazole. He just started this and reasonable relief so far. He asks about taking just once a day and we discussed how BID likely more effective but could trial if he wanted A/P: continue zantac trial. Hope once a day effective given difficulty of BID dosing for patient

## 2018-03-07 NOTE — Patient Instructions (Signed)
If this doesn't resolve within a month we can either try a shave biopsy or refer to dermatology  Keep up the lotion and the flonase at least for the next few weeks

## 2018-03-07 NOTE — Progress Notes (Addendum)
Subjective:  [name] is a 72 y.o. year old very pleasant male patient who presents for/with See problem oriented charting ROS- lesion on neck has decreased in side. Still with nasal congestion. No edema.  No reported acid reflux.    Past Medical History-  Patient Active Problem List   Diagnosis Date Noted  . CAD (coronary artery disease) 05/26/2016    Priority: High  . Nonischemic cardiomyopathy ([place]) 04/23/2016    Priority: High  . CVA (cerebral infarction) 01/30/2015    Priority: High  . Atrial fibrillation and flutter ([place]) 01/01/2015    Priority: High  . Ventricular tachycardia, non-sustained ([place]) 01/25/2013    Priority: High  . Cardiac pacemaker in situ 12/28/2009    Priority: High  . Hyperglycemia 06/10/2017    Priority: Medium  . Insomnia 01/30/2015    Priority: Medium  . BPH associated with nocturia 08/14/2014    Priority: Medium  . Hyperlipidemia 07/12/2007    Priority: Medium  . NEUROPATHY, ISCHEMIC OPTIC 06/02/2007    Priority: Medium  . Essential hypertension 06/02/2007    Priority: Medium  . Solitary pulmonary nodule 05/26/2016    Priority: Low  . Left chest pressure 03/19/2015    Priority: Low  . Erectile dysfunction 01/30/2015    Priority: Low  . Former smoker 01/30/2015    Priority: Low  . Allergic rhinitis 10/11/2014    Priority: Low  . Sinoatrial node dysfunction (HCC)     Priority: Low  . SPINAL STENOSIS, LUMBAR 08/22/2010    Priority: Low  . GERD 03/17/2010    Priority: Low  . Trigger finger, right middle finger 03/01/2018    Medications- reviewed and updated Current Outpatient Medications  Medication Sig Dispense Refill  . benazepril (LOTENSIN) 5 MG tablet TAKE ONE TABLET BY MOUTH ONCE DAILY 90 tablet 3  . cyclobenzaprine (FLEXERIL) 5 MG tablet Take 1 tablet (5 mg total) by mouth 3 (three) times daily as needed for muscle spasms. 30 tablet 2  . finasteride (PROSCAR) 5 MG tablet TAKE 1 TABLET BY MOUTH ONCE DAILY 90 tablet 2  .  fish oil-omega-3 fatty acids 1000 MG capsule Take 2 g by mouth daily. Reported on 02/18/2016    . gemfibrozil (LOPID) 600 MG tablet TAKE ONE TABLET BY MOUTH TWICE DAILY BEFORE A MEAL(S). 180 tablet 3  . indomethacin (INDOCIN) 25 MG capsule Take 25 mg by mouth 2 (two) times daily as needed (pain). Take with a meal. 3 days maximun    . metoprolol succinate (TOPROL-XL) 25 MG 24 hr tablet Take 1 tablet (25 mg total) by mouth daily. 90 tablet 3  . moxifloxacin (VIGAMOX) 0.5 % ophthalmic solution   0  . Multiple Vitamin (MULTIVITAMIN) tablet Take 1 tablet by mouth daily.      . prednisoLONE acetate (PRED FORTE) 1 % ophthalmic suspension   0  . PROLENSA 0.07 % SOLN   0  . ranitidine (ZANTAC) 150 MG tablet Take 150 mg by mouth 2 (two) times daily as needed for heartburn.    . rosuvastatin (CRESTOR) 20 MG tablet Take 1 tablet (20 mg total) by mouth as directed. Future refills request needs to go to cardiology. Crissie Sickles MD 90 tablet 3  . tadalafil (CIALIS) 5 MG tablet TAKE 1 TABLET BY MOUTH EVERY DAY AS NEEDED FOR erectile dysfunction. 90 tablet 0  . tamsulosin (FLOMAX) 0.4 MG CAPS capsule TAKE ONE CAPSULE BY MOUTH ONCE DAILY. 50 capsule 7  . XARELTO 20 MG TABS tablet TAKE ONE TABLET BY  MOUTH ONCE DAILY SUPPER 90 tablet 3  . zolpidem (AMBIEN) 5 MG tablet Take 1 tablet (5 mg total) by mouth at bedtime. 30 tablet 5   No current facility-administered medications for this visit.     Objective: BP 120/72 (BP Location: Left Arm, Patient Position: Sitting, Cuff Size: Large)   Pulse 70   Temp 98.7 F (37.1 C) (Oral)   Ht 5\' 8"  (1.727 m)   Wt 168 lb 6.4 oz (76.4 kg)   SpO2 97%   BMI 25.61 kg/m  Gen: NAD, resting comfortably CV: RRR Lungs: nonlabored Ext: no edema Skin: warm, dry, on right neck there is a 3-4 by about 2 mm lesion - raised with some scale but also erythema at the base.   Procedure note: Benefits and risks verbally discussed with patient 10 second freeze thaw cycle of cryotherapy  performed over right neck lesion No complications.  Patient tolerated the procedure well other than mild pain.   Assessment/Plan:  Actinic Keratosis S: area on right neck we had planned to do a shave biopsy/removal today. This has shrunk in size some from last visit but still has red base and scale on top A/P: I am concerned for inflamed actinic keratosis- though it has reduced in size- still think attempted removal reasonable. Cryotherapy completed today.   Allergic rhinitis S: He has continued allegra and started flonase more regularly. Some mild relief so far A/P: encouraged continued use of medication- both flonase and allegra until allergy season calms down some   GERD S: at last visit we discussed trial of zantac over omeprazole. He just started this and reasonable relief so far. He asks about taking just once a day and we discussed how BID likely more effective but could trial if he wanted A/P: continue zantac trial. Hope once a day effective given difficulty of BID dosing for patient   Future Appointments  Date Time Provider [place]  04/26/2018 10:00 AM Gerda Diss, DO [facility]  08/17/2018  1:45 PM Evans Lance, MD [facility]   Meds ordered this encounter  Medications  . metoprolol succinate (TOPROL-XL) 25 MG 24 hr tablet    Sig: Take 1 tablet (25 mg total) by mouth daily.    Dispense:  90 tablet    Refill:  3    Return precautions advised.  Garret Reddish, MD

## 2018-04-26 ENCOUNTER — Ambulatory Visit: Payer: Medicare Other | Admitting: Sports Medicine

## 2018-04-29 ENCOUNTER — Telehealth: Payer: Self-pay | Admitting: Family Medicine

## 2018-04-29 NOTE — Telephone Encounter (Signed)
Copied from [place] [id number]. Topic: Inquiry >> Apr 29, 2018  3:39 PM Pricilla Handler wrote: Reason for CRM: Patient wants a renewable 90 Day supply of Tadalafil (CIALIS) 5 MG tablet. Patient's preferred pharmacy is [facility] [address], [place] [phone] (Phone)   [phone] (Fax).

## 2018-05-02 ENCOUNTER — Other Ambulatory Visit: Payer: Self-pay

## 2018-05-02 MED ORDER — TADALAFIL 5 MG PO TABS: ORAL_TABLET | ORAL | 0 refills | Status: DC

## 2018-05-02 NOTE — Telephone Encounter (Signed)
See note

## 2018-05-02 NOTE — Telephone Encounter (Signed)
Prescription sent to pharmacy as requested.

## 2018-05-21 ENCOUNTER — Other Ambulatory Visit: Payer: Self-pay | Admitting: Family Medicine

## 2018-05-21 DIAGNOSIS — I255 Ischemic cardiomyopathy: Secondary | ICD-10-CM

## 2018-06-27 ENCOUNTER — Encounter: Payer: Medicare Other | Admitting: Family Medicine

## 2018-07-04 ENCOUNTER — Encounter: Payer: Self-pay | Admitting: Family Medicine

## 2018-07-04 ENCOUNTER — Ambulatory Visit (INDEPENDENT_AMBULATORY_CARE_PROVIDER_SITE_OTHER): Payer: Medicare Other | Admitting: Family Medicine

## 2018-07-04 VITALS — BP 136/74 | HR 70 | Temp 98.0°F | Ht 68.0 in | Wt 165.8 lb

## 2018-07-04 DIAGNOSIS — R351 Nocturia: Secondary | ICD-10-CM | POA: Diagnosis not present

## 2018-07-04 DIAGNOSIS — G4700 Insomnia, unspecified: Secondary | ICD-10-CM

## 2018-07-04 DIAGNOSIS — H532 Diplopia: Secondary | ICD-10-CM | POA: Insufficient documentation

## 2018-07-04 DIAGNOSIS — R911 Solitary pulmonary nodule: Secondary | ICD-10-CM | POA: Diagnosis not present

## 2018-07-04 DIAGNOSIS — Z0000 Encounter for general adult medical examination without abnormal findings: Secondary | ICD-10-CM

## 2018-07-04 DIAGNOSIS — E785 Hyperlipidemia, unspecified: Secondary | ICD-10-CM

## 2018-07-04 DIAGNOSIS — N644 Mastodynia: Secondary | ICD-10-CM

## 2018-07-04 DIAGNOSIS — M48061 Spinal stenosis, lumbar region without neurogenic claudication: Secondary | ICD-10-CM

## 2018-07-04 DIAGNOSIS — N401 Enlarged prostate with lower urinary tract symptoms: Secondary | ICD-10-CM

## 2018-07-04 DIAGNOSIS — K219 Gastro-esophageal reflux disease without esophagitis: Secondary | ICD-10-CM

## 2018-07-04 HISTORY — DX: Diplopia: H53.2

## 2018-07-04 LAB — POC URINALSYSI DIPSTICK (AUTOMATED)
BILIRUBIN UA: NEGATIVE
Blood, UA: NEGATIVE
Glucose, UA: NEGATIVE
Ketones, UA: NEGATIVE
LEUKOCYTES UA: NEGATIVE
Nitrite, UA: NEGATIVE
Protein, UA: NEGATIVE
Spec Grav, UA: 1.025 (ref 1.010–1.025)
Urobilinogen, UA: 0.2 E.U./dL
pH, UA: 5 (ref 5.0–8.0)

## 2018-07-04 NOTE — Assessment & Plan Note (Addendum)
S:After cataract surgery on both eyes- has had light sensitivity to fluorescent lights possibly both eyes, eye pain in left eye, and can get headaches. Eye tends to tear more. Eye doesn't feel dry. Tried some sensitive eye drops and that didn't tend to help.   Also gets an eye crossing sensation when he is reading where he will see the same column reading- will see the left side of page transposed on top of it. Has appointment a month away- I encourage dhim to move this up.  A/P: reassuring neurological exam and I could not reproduce double vision. Advised prompt optho follow up since this started after the surgery he had- would ask for their recommendations if they do not think optho origin and could consider neuroimaging or neurology referral. Does have history of ischemic neuropathy in both left and right eye but that was visual loss not just double vision. Doubt intermittent TIA causing double viison on daily basis.

## 2018-07-04 NOTE — Progress Notes (Signed)
Phone: [phone]  Subjective:  Patient presents today for their annual physical. Chief complaint-noted.   See problem oriented charting- ROS- full  review of systems was completed and negative except for: continued issues with back pain, some difficulty reading due to vision issues  The following were reviewed and entered/updated in epic: Past Medical History:  Diagnosis Date  . Abscess    right posterior neck  . Bradycardia   . CVA (cerebral vascular accident) ([place])   . Elevated LFTs   . External hemorrhoids   . GERD (gastroesophageal reflux disease)   . HTN (hypertension)   . Ischemic optic neuropathy   . Other and unspecified hyperlipidemia   . Sinoatrial node dysfunction ([facility])   . Spinal stenosis    Patient Active Problem List   Diagnosis Date Noted  . CAD (coronary artery disease) 05/26/2016    Priority: High  . Nonischemic cardiomyopathy ([place]) 04/23/2016    Priority: High  . CVA (cerebral infarction) 01/30/2015    Priority: High  . Atrial fibrillation and flutter ([place]) 01/01/2015    Priority: High  . Ventricular tachycardia, non-sustained ([place]) 01/25/2013    Priority: High  . Cardiac pacemaker in situ 12/28/2009    Priority: High  . Hyperglycemia 06/10/2017    Priority: Medium  . Insomnia 01/30/2015    Priority: Medium  . BPH associated with nocturia 08/14/2014    Priority: Medium  . Hyperlipidemia 07/12/2007    Priority: Medium  . NEUROPATHY, ISCHEMIC OPTIC 06/02/2007    Priority: Medium  . Essential hypertension 06/02/2007    Priority: Medium  . Solitary pulmonary nodule 05/26/2016    Priority: Low  . Left chest pressure 03/19/2015    Priority: Low  . Erectile dysfunction 01/30/2015    Priority: Low  . Former smoker 01/30/2015    Priority: Low  . Allergic rhinitis 10/11/2014    Priority: Low  . Sinoatrial node dysfunction ([facility])     Priority: Low  . SPINAL STENOSIS, LUMBAR 08/22/2010    Priority: Low  . GERD 03/17/2010    Priority: Low  .  Double vision 07/04/2018  . Trigger finger, right middle finger 03/01/2018   Past Surgical History:  Procedure Laterality Date  . ABSCESS DRAINAGE     righ tposterior neck  . CARDIAC CATHETERIZATION  1996  . CHOLECYSTECTOMY  02/07/2003  . COLONOSCOPY  06/23/2004  . EYE SURGERY     left eye  . left ingunial hernia  06/08/2011  . PACEMAKER INSERTION    . right hip replacement     2012 Dr. Maureen Ralphs    Family History  Problem Relation Age of Onset  . Lung cancer Mother        mets to brain  . Heart attack Mother 63  . Hypertension Mother   . Brain cancer Mother   . Heart attack Father        mid 84s, smoker.   . Diabetes Father   . Diabetes Paternal Grandfather   . Healthy Brother     Medications- reviewed and updated Current Outpatient Medications  Medication Sig Dispense Refill  . benazepril (LOTENSIN) 5 MG tablet TAKE ONE TABLET BY MOUTH ONCE DAILY 90 tablet 3  . cyclobenzaprine (FLEXERIL) 5 MG tablet Take 1 tablet (5 mg total) by mouth 3 (three) times daily as needed for muscle spasms. 30 tablet 2  . finasteride (PROSCAR) 5 MG tablet TAKE 1 TABLET BY MOUTH ONCE DAILY 90 tablet 2  . fish oil-omega-3 fatty acids 1000 MG capsule Take 2  g by mouth daily. Reported on 02/18/2016    . gemfibrozil (LOPID) 600 MG tablet TAKE ONE TABLET BY MOUTH TWICE DAILY BEFORE A MEAL(S). 180 tablet 3  . indomethacin (INDOCIN) 25 MG capsule Take 25 mg by mouth 2 (two) times daily as needed (pain). Take with a meal. 3 days maximun    . metoprolol succinate (TOPROL-XL) 25 MG 24 hr tablet Take 1 tablet (25 mg total) by mouth daily. 90 tablet 3  . moxifloxacin (VIGAMOX) 0.5 % ophthalmic solution   0  . Multiple Vitamin (MULTIVITAMIN) tablet Take 1 tablet by mouth daily.      Marland Kitchen PROLENSA 0.07 % SOLN   0  . ranitidine (ZANTAC) 150 MG tablet Take 150 mg by mouth 2 (two) times daily as needed for heartburn.    . rosuvastatin (CRESTOR) 20 MG tablet TAKE ONE TABLET BY MOUTH ONCE DAILY 90 tablet 3  .  tadalafil (CIALIS) 5 MG tablet TAKE 1 TABLET BY MOUTH EVERY DAY AS NEEDED FOR erectile dysfunction. 90 tablet 0  . tamsulosin (FLOMAX) 0.4 MG CAPS capsule TAKE ONE CAPSULE BY MOUTH ONCE DAILY. 50 capsule 7  . XARELTO 20 MG TABS tablet TAKE ONE TABLET BY MOUTH ONCE DAILY SUPPER 90 tablet 3  . zolpidem (AMBIEN) 5 MG tablet Take 1 tablet (5 mg total) by mouth at bedtime. 30 tablet 5   No current facility-administered medications for this visit.     Allergies-reviewed and updated No Known Allergies  Social History   Social History Narrative   Married. 1 child and 1 step child. 2 grandchildren.       Lawyer (part time)      Hobbies: swimming (states not fun), travel    Objective: BP 136/74 (BP Location: Left Arm, Patient Position: Sitting, Cuff Size: Normal)   Pulse 70   Temp 98 F (36.7 C) (Oral)   Ht 5\' 8"  (1.727 m)   Wt 165 lb 12.8 oz (75.2 kg)   SpO2 95%   BMI 25.21 kg/m  Gen: NAD, resting comfortably HEENT: Mucous membranes are moist. Oropharynx normal Neck: no thyromegaly CV: RRR no murmurs rubs or gallops Lungs: CTAB no crackles, wheeze, rhonchi Abdomen: soft/nontender/nondistended/normal bowel sounds. No rebound or guarding.  Ext: no edema Skin: warm, dry Rectal: normal tone, diffusely enlarged prostate, no masses or tenderness Neuro: CN II-XII intact, sensation and reflexes normal throughout, 5/5 muscle strength in bilateral upper and lower extremities. Normal finger to nose. Normal rapid alternating movements. No pronator drift. Normal romberg. Normal gait.    Assessment/Plan:  72 y.o. male presenting for annual physical.  Health Maintenance counseling: 1. Anticipatory guidance: Patient counseled regarding regular dental exams -q6 months, eye exams -yearly, wearing seatbelts.  2. Risk factor reduction:  Advised patient of need for regular exercise and diet rich and fruits and vegetables to reduce risk of heart attack and stroke. Weight stable from last year.  Exercise- 2-3 days a week at [facility]. Diet-reasonable diet- has maintained 5 lbs down from last year being around 170 Wt Readings from Last 3 Encounters:  07/04/18 165 lb 12.8 oz (75.2 kg)  03/07/18 168 lb 6.4 oz (76.4 kg)  03/01/18 173 lb (78.5 kg)  3. Immunizations/screenings/ancillary studies- advised fall flu shot. Discussed shingrix at pharmacy .  Immunization History  Administered Date(s) Administered  . Hepatitis A, Adult 08/08/2015, 05/26/2016  . Influenza Split 11/09/2011, 09/20/2012  . Influenza Whole 11/14/2007  . Influenza, High Dose Seasonal PF 08/08/2015  . Influenza,inj,Quad PF,6+ Mos 09/14/2013, 12/27/2014  .  Pneumococcal Conjugate-13 01/30/2015  . Pneumococcal Polysaccharide-23 11/09/2011  . Td 10/04/2008  4. Prostate cancer screening-  BPH with nocturia on finasteride (normal size prostate when on finasteride last year- feels enlarged this year). PSA trended up some last year- if continues to trend up may need to refer to urology.  Lab Results  Component Value Date   PSA 1.25 06/09/2017   PSA 0.99 05/15/2016   PSA 0.91 08/08/2015   5. Colon cancer screening - 02/2010 with 10 year repeat 6. Skin cancer screening- doesn't see dermatology regularly. advised regular sunscreen use. Denies worrisome, changing, or new skin lesions.   Status of chronic or acute concerns   Breast pain, left - Plan: US BREAST LTD UNI LEFT INC AXILLA S: Left nipple sore to touch- for about 1-2 months. Even gentle touch bothers it. Pain is mild. No family history breast cancer.  A/P: He would like a more concrete answer on reason for pain. No rash to suggest shingles. Perhaps mild gynecomastia- we will get ultrasound for more information.   Follows with [facility] for multiple cardiac issues including pacemaker due to SA node dysfunction- also on metoprolol 25mg  XL,  nonischemic cardiomyopathy on benazepril, CAD- asymptomatic, a fib and flutter. He is on xarelto but not on aspirin per cardiology. He  also has history of CVA with minimal residual left sided weakness-notes at the gym. Also thought ischemic optic neruopathy. Compliant with crestor 20mg  and gemfibrozil 600mg  for hyperlipidemia but last LDL was still 107 but triglycerides controlled. BP controlled on benazepril. Has had asymptomatic v tach in past non sustained- noted on pacemaker.   Former smoker- UA without blood  Solitary pulmonary nodule S: 07/07/17 5 mm stable- was advised no further follow up from radiology. A/P: Due to moms history of lung cancer this makes him nervous- asks about repeating this year and we discussed radiation risk. He asks about x-ray and we discussed this likely would not pick up such a small lesion. Finally after discussion he opted to rediscuss next year with strong preference to have test done at that time- repeat CT  SPINAL STENOSIS, LUMBAR Back problems for the last 40 years. Every so often gets severe back pain for a week or more when essentially throws his back out. Can happen right, left or middle. Will get some popping sounds after that. He usually rests and improves over a week- if worsens he will return to see [place].   Double vision S:After cataract surgery on both eyes- has had light sensitivity to fluorescent lights possibly both eyes, eye pain in left eye, and can get headaches. Eye tends to tear more. Eye doesn't feel dry. Tried some sensitive eye drops and that didn't tend to help.   Also gets an eye crossing sensation when he is reading where he will see the same column reading- will see the left side of page transposed on top of it. Has appointment a month away- I encourage dhim to move this up.  A/P: reassuring neurological exam and I could not reproduce double vision. Advised prompt optho follow up since this started after the surgery he had- would ask for their recommendations if they do not think optho origin and could consider neuroimaging or neurology referral. Does have history of ischemic  neuropathy in both left and right eye but that was visual loss not just double vision. Doubt intermittent TIA causing double viison on daily basis.    Insomnia Insomnia controlled on prn ambien. can refill until 78month visit  GERD He stopped zantac completely and reflux worsened- advised him to restart zantac BID   Future Appointments  Date Time Provider Rafael Capo  08/17/2018  1:45 PM Evans Lance, MD [facility]   Advised 6 month follow up  Lab/Order associations: Breast pain, left - Plan: US BREAST LTD UNI LEFT INC AXILLA  Hyperlipidemia, unspecified hyperlipidemia type - Plan: CBC, Comprehensive metabolic panel, Lipid panel, POCT Urinalysis Dipstick (Automated)  BPH associated with nocturia - Plan: PSA  Solitary pulmonary nodule  SPINAL STENOSIS, LUMBAR  Double vision  Insomnia, unspecified type  Gastroesophageal reflux disease without esophagitis  Return precautions advised.  Garret Reddish, MD

## 2018-07-04 NOTE — Assessment & Plan Note (Signed)
He stopped zantac completely and reflux worsened- advised him to restart zantac BID

## 2018-07-04 NOTE — Patient Instructions (Addendum)
Health Maintenance Due  Topic Date Due  . I would also like for you to sign up for an annual wellness visit on or after 07/06/2018 with one of our nurses, Cassie or Manuela Schwartz, who both specialize in the annual wellness visit. This is a free benefit under medicare that may help [place] find additional ways to help you. Some highlights are reviewing medications, lifestyle, and doing a dementia screen.    . Please check with your pharmacy to see if they have the shingrix vaccine. If they do- please get this immunization and update [place] by phone call or mychart with dates you receive the vaccine   . INFLUENZA VACCINE - advised fall flu shot 06/30/2018   We will call you within two weeks about your referral for ultrasound of left breast/nipple. If you do not hear within 3 weeks, give [place] a call.   Start your zantac back for reflux  Please see your eye doctor sooner than a month since your eye issues started after your surgery  Lets follow up 3-6 months from now or sooner if you need [place]

## 2018-07-04 NOTE — Assessment & Plan Note (Signed)
Insomnia controlled on prn ambien. can refill until 47month visit

## 2018-07-04 NOTE — Assessment & Plan Note (Signed)
Back problems for the last 40 years. Every so often gets severe back pain for a week or more when essentially throws his back out. Can happen right, left or middle. Will get some popping sounds after that. He usually rests and improves over a week- if worsens he will return to see [place].

## 2018-07-04 NOTE — Assessment & Plan Note (Signed)
S: 07/07/17 5 mm stable- was advised no further follow up from radiology. A/P: Due to moms history of lung cancer this makes him nervous- asks about repeating this year and we discussed radiation risk. He asks about x-ray and we discussed this likely would not pick up such a small lesion. Finally after discussion he opted to rediscuss next year with strong preference to have test done at that time- repeat CT

## 2018-07-05 LAB — LIPID PANEL
CHOLESTEROL: 171 mg/dL (ref 0–200)
HDL: 41.1 mg/dL (ref >39.00)
LDL Cholesterol: 95 mg/dL (ref 0–99)
NONHDL: 130.31
Total CHOL/HDL Ratio: 4
Triglycerides: 179 mg/dL — ABNORMAL HIGH (ref 0.0–149.0)
VLDL: 35.8 mg/dL (ref 0.0–40.0)

## 2018-07-05 LAB — COMPREHENSIVE METABOLIC PANEL
ALBUMIN: 5 g/dL (ref 3.5–5.2)
ALK PHOS: 69 U/L (ref 39–117)
ALT: 18 U/L (ref 0–53)
AST: 18 U/L (ref 0–37)
BILIRUBIN TOTAL: 1 mg/dL (ref 0.2–1.2)
BUN: 23 mg/dL (ref 6–23)
CO2: 27 mEq/L (ref 19–32)
CREATININE: 1.27 mg/dL (ref 0.40–1.50)
Calcium: 10.5 mg/dL (ref 8.4–10.5)
Chloride: 102 mEq/L (ref 96–112)
GFR: 59.3 mL/min — ABNORMAL LOW (ref >60.00)
GLUCOSE: 107 mg/dL — AB (ref 70–99)
Potassium: 4.7 mEq/L (ref 3.5–5.1)
SODIUM: 140 meq/L (ref 135–145)
TOTAL PROTEIN: 7.6 g/dL (ref 6.0–8.3)

## 2018-07-05 LAB — CBC
HCT: 45.7 % (ref 39.0–52.0)
Hemoglobin: 15.5 g/dL (ref 13.0–17.0)
MCHC: 33.8 g/dL (ref 30.0–36.0)
MCV: 90.3 fl (ref 78.0–100.0)
Platelets: 262 10*3/uL (ref 150.0–400.0)
RBC: 5.06 Mil/uL (ref 4.22–5.81)
RDW: 14.3 % (ref 11.5–15.5)
WBC: 7.2 10*3/uL (ref 4.0–10.5)

## 2018-07-05 LAB — PSA: PSA: 0.97 ng/mL (ref 0.10–4.00)

## 2018-07-06 ENCOUNTER — Ambulatory Visit: Payer: Self-pay

## 2018-07-06 ENCOUNTER — Ambulatory Visit: Payer: Medicare Other | Admitting: Sports Medicine

## 2018-07-06 ENCOUNTER — Encounter: Payer: Self-pay | Admitting: Sports Medicine

## 2018-07-06 VITALS — BP 130/80 | HR 70 | Ht 68.0 in | Wt 166.0 lb

## 2018-07-06 DIAGNOSIS — M65331 Trigger finger, right middle finger: Secondary | ICD-10-CM

## 2018-07-06 NOTE — Patient Instructions (Signed)

## 2018-07-06 NOTE — Progress Notes (Signed)
[name]. , [name] at [facility] [phone]  [name] - 72 y.o. male MRN [id number]  Date of birth: [birth year]  Visit Date: 07/06/2018  PCP: Marin Olp, MD   Referred by: Marin Olp, MD  Scribe(s) for today's visit: Wendy Poet, LAT, ATC   SUBJECTIVE:  [name] is here for Follow-up (trigger finger) .    Notes from Initial Visit on 03/01/18: His R middle finger symptoms INITIALLY: Began about 1.5 mos ago Described as mild pain and catching, nonradiating Worsened with making a fist. He is unable to completely bend the middle finger.  Improved with rest.  Additional associated symptoms include: He has some discomfort with palpation around the knuckle. There is mild swelling present.     At this time symptoms show no change compared to onset  He has not taken anything OTC, he does not take NSAIDs because he is on Xarelto.   07/06/2018: Compared to the last office visit on 03/01/18, his previously described R middle finger trigger finger symptoms are worsening.  He states that his R middle finger is catching and getting stuck again. Current symptoms are moderate & are nonradiating He has not been doing anything in particular for his symptoms.  He had an injection at his last visit which helped for approximately 2 months but then his symptoms started to worsen.   REVIEW OF SYSTEMS: Denies night time disturbances. Denies fevers, chills, or night sweats. Denies unexplained weight loss. Denies personal history of cancer. Denies changes in bowel or bladder habits. Denies recent unreported falls. Denies new or worsening dyspnea or wheezing. Denies headaches or dizziness.  Denies numbness, tingling or weakness  In the extremities.  Denies dizziness or presyncopal episodes Denies lower extremity edema    HISTORY:  Prior history reviewed and updated per electronic medical record.  Social History     Occupational History  . Not on file  Tobacco Use  . Smoking status: Former Smoker    Packs/day: 1.00    Years: 20.00    Pack years: 20.00    Types: Cigarettes    Last attempt to quit: 11/04/1991    Years since quitting: 26.7  . Smokeless tobacco: Never Used  Substance and Sexual Activity  . Alcohol use: Yes    Alcohol/week: 0.0 standard drinks    Comment: occ  . Drug use: No  . Sexual activity: Not on file   Social History   Social History Narrative   Married. 1 child and 1 step child. 2 grandchildren.       Lawyer (part time)      Hobbies: swimming (states not fun), travel   [place]:  No results for input(s): HGBA1C, LABURIC, CREATINE in the last 8760 hours. .   OBJECTIVE:  VS:  HT:5\' 8"  (172.7 cm)   WT:166 lb (75.3 kg)  BMI:25.25    BP:130/80  HR:70bpm  TEMP: ( )  RESP:96 %   PHYSICAL EXAM: CONSTITUTIONAL: Well-developed, Well-nourished and In no acute distress PSYCHIATRIC: Alert & appropriately interactive. and Not depressed or anxious appearing. RESPIRATORY: No increased work of breathing and Trachea Midline EYES: Pupils are equal., EOM intact without nystagmus. and No scleral icterus.  VASCULAR EXAM: Warm and well perfused NEURO: unremarkable  MSK Exam: right hand  Well aligned, no significant deformity. No overlying skin changes. TTP over A1 pulley of the right third finger/middle finger.  Appreciable triggering noted.     ASSESSMENT  1. Trigger finger, right middle finger     PLAN:  Pertinent additional documentation may be included in corresponding procedure notes, imaging studies, problem based documentation and patient instructions.  Procedures:  . US Guided Injection per procedure note  Medications:  No orders of the defined types were placed in this encounter.  Discussion/Instructions: No problem-specific Assessment & Plan notes found for this encounter.  . Discussed if it continues to recur/worsen that  surgical intervention will be warranted but repeat injection performed today.  Follow-up:  . Return in about 2 months (around 09/14/2018).  . If any lack of improvement consider: . repeat corticosteroid injections . Referral to hand surgery     CMA/ATC served as scribe during this visit. History, Physical, and Plan performed by medical provider. Documentation and orders reviewed and attested to.      Gerda Diss, [place] [facility] Physician

## 2018-07-14 ENCOUNTER — Other Ambulatory Visit: Payer: Self-pay | Admitting: Family Medicine

## 2018-07-14 DIAGNOSIS — N644 Mastodynia: Secondary | ICD-10-CM

## 2018-07-19 ENCOUNTER — Ambulatory Visit
Admission: RE | Admit: 2018-07-19 | Discharge: 2018-07-19 | Disposition: A | Discharge status: Home / Self Care | Payer: Medicare Other | Source: Ambulatory Visit | Attending: Family Medicine | Admitting: Family Medicine

## 2018-07-19 ENCOUNTER — Ambulatory Visit: Admission: RE | Admit: 2018-07-19 | Payer: Medicare Other | Source: Ambulatory Visit

## 2018-07-19 DIAGNOSIS — N644 Mastodynia: Secondary | ICD-10-CM

## 2018-08-12 ENCOUNTER — Ambulatory Visit: Payer: Medicare Other | Admitting: Family Medicine

## 2018-08-12 ENCOUNTER — Encounter: Payer: Self-pay | Admitting: Family Medicine

## 2018-08-12 VITALS — BP 126/74 | HR 74 | Temp 98.5°F | Ht 68.0 in | Wt 168.0 lb

## 2018-08-12 DIAGNOSIS — L989 Disorder of the skin and subcutaneous tissue, unspecified: Secondary | ICD-10-CM | POA: Diagnosis not present

## 2018-08-12 DIAGNOSIS — G4700 Insomnia, unspecified: Secondary | ICD-10-CM

## 2018-08-12 DIAGNOSIS — M10072 Idiopathic gout, left ankle and foot: Secondary | ICD-10-CM | POA: Diagnosis not present

## 2018-08-12 MED ORDER — ZOLPIDEM TARTRATE 5 MG PO TABS: 5.0000 mg | ORAL_TABLET | Freq: Every day | ORAL | 1 refills | Status: DC

## 2018-08-12 MED ORDER — PREDNISONE 20 MG PO TABS: ORAL_TABLET | ORAL | 0 refills | Status: DC

## 2018-08-12 NOTE — Assessment & Plan Note (Signed)
S: [name] working well for insomnia. He would prefer 90 day instead of 30 days.  A/P: I sent this in for him

## 2018-08-12 NOTE — Patient Instructions (Signed)
Health Maintenance Due  Topic Date Due  . INFLUENZA VACCINE - flu shot later in  The fall. Send [place] a message when you get this.  06/30/2018   We will call you within two weeks about your referral to Dr. Delman Cheadle. If you do not hear within 3 weeks, give [place] a call.  Im hoping we can get you in within a month.   Trial prednisone to see if that helps with the foot - we can consider uric acid while not in a flare. This could be gout or pseudogout. If symptoms worsen on prednisone after 2 days stop and come back and see [place].

## 2018-08-12 NOTE — Assessment & Plan Note (Signed)
S: woke up Tuesday morning unable to step on the foot. Noted some swelling behind the ankle. Gets a burning sensation and tingling up to about mid shin. Inability to walk has resolved after taking some indomethacin- perhaps by Wednesday PM for most part. Still noting some swelling. He is still favoring the ankle though and trying to avoid pressure so obviously still with some pain but he reports mild.   I asked him about gout history and he reports had about a year of issues in great toe and there was concern for gout when had uric acid tested and was 7.3. He states never took colchicine due to cost but treated with prednisone and one flare resolved A/P: 71 year old male who sounds like has a history of gout now with ankle swelling, redness, warmth concerning for potential gout vs. Pseudogout flare. We will treat with course of prednisone since nsaids not ideal with his heart history and xarelto use. If fails to improve would get him in with Dr. Paulla Fore. Likely get uric acid next labs when not in flre and if recurrent issues consider uric acid lowering agent  When I went to add gout to problem list so we could consider diagnosis- opened up originally noted back in 2010- further back then 2013 we initially discussed tonight

## 2018-08-12 NOTE — Progress Notes (Signed)
Subjective:  [name] is a 72 y.o. year old very pleasant male patient who presents for/with See problem oriented charting ROS- no fever, chills. No expanding redness on ankle. No pretibial edema. No nausea or vomiting. Does have new skin lesion on arm.    Past Medical History-  Patient Active Problem List   Diagnosis Date Noted  . CAD (coronary artery disease) 05/26/2016    Priority: High  . Nonischemic cardiomyopathy ([place]) 04/23/2016    Priority: High  . CVA (cerebral infarction) 01/30/2015    Priority: High  . Atrial fibrillation and flutter ([place]) 01/01/2015    Priority: High  . Ventricular tachycardia, non-sustained ([place]) 01/25/2013    Priority: High  . Cardiac pacemaker in situ 12/28/2009    Priority: High  . Hyperglycemia 06/10/2017    Priority: Medium  . Insomnia 01/30/2015    Priority: Medium  . BPH associated with nocturia 08/14/2014    Priority: Medium  . Hyperlipidemia 07/12/2007    Priority: Medium  . NEUROPATHY, ISCHEMIC OPTIC 06/02/2007    Priority: Medium  . Essential hypertension 06/02/2007    Priority: Medium  . Solitary pulmonary nodule 05/26/2016    Priority: Low  . Left chest pressure 03/19/2015    Priority: Low  . Erectile dysfunction 01/30/2015    Priority: Low  . Former smoker 01/30/2015    Priority: Low  . Allergic rhinitis 10/11/2014    Priority: Low  . Sinoatrial node dysfunction (HCC)     Priority: Low  . SPINAL STENOSIS, LUMBAR 08/22/2010    Priority: Low  . GERD 03/17/2010    Priority: Low  . Double vision 07/04/2018  . Trigger finger, right middle finger 03/01/2018  . Gout 11/11/2009    Medications- reviewed and updated Current Outpatient Medications  Medication Sig Dispense Refill  . benazepril (LOTENSIN) 5 MG tablet TAKE ONE TABLET BY MOUTH ONCE DAILY 90 tablet 3  . cyclobenzaprine (FLEXERIL) 5 MG tablet Take 1 tablet (5 mg total) by mouth 3 (three) times daily as needed for muscle spasms. 30 tablet 2  . finasteride  (PROSCAR) 5 MG tablet TAKE 1 TABLET BY MOUTH ONCE DAILY 90 tablet 2  . fish oil-omega-3 fatty acids 1000 MG capsule Take 2 g by mouth daily. Reported on 02/18/2016    . gemfibrozil (LOPID) 600 MG tablet TAKE ONE TABLET BY MOUTH TWICE DAILY BEFORE A MEAL(S). 180 tablet 3  . indomethacin (INDOCIN) 25 MG capsule Take 25 mg by mouth 2 (two) times daily as needed (pain). Take with a meal. 3 days maximun    . metoprolol succinate (TOPROL-XL) 25 MG 24 hr tablet Take 1 tablet (25 mg total) by mouth daily. 90 tablet 3  . Multiple Vitamin (MULTIVITAMIN) tablet Take 1 tablet by mouth daily.      . ranitidine (ZANTAC) 150 MG tablet Take 150 mg by mouth 2 (two) times daily as needed for heartburn.    . rosuvastatin (CRESTOR) 20 MG tablet TAKE ONE TABLET BY MOUTH ONCE DAILY 90 tablet 3  . tadalafil (CIALIS) 5 MG tablet TAKE 1 TABLET BY MOUTH EVERY DAY AS NEEDED FOR erectile dysfunction. 90 tablet 0  . tamsulosin (FLOMAX) 0.4 MG CAPS capsule TAKE ONE CAPSULE BY MOUTH ONCE DAILY. 50 capsule 7  . XARELTO 20 MG TABS tablet TAKE ONE TABLET BY MOUTH ONCE DAILY SUPPER 90 tablet 3  . zolpidem (AMBIEN) 5 MG tablet Take 1 tablet (5 mg total) by mouth at bedtime. 90 tablet 1  . predniSONE (DELTASONE) 20 MG tablet  Take 1 tablet by mouth daily for 5 days, then 1/2 tablet daily for 2 days 6 tablet 0   No current facility-administered medications for this visit.     Objective: BP 126/74 (BP Location: Left Arm, Patient Position: Sitting, Cuff Size: Normal)   Pulse 74   Temp 98.5 F (36.9 C) (Oral)   Ht 5\' 8"  (1.727 m)   Wt 168 lb (76.2 kg)   SpO2 98%   BMI 25.54 kg/m  Gen: NAD, resting comfortably CV: RRR  Lungs: nonlabored, normal respiratory rate Abdomen: soft/nondistended Ext: no edema Skin: warm, dry, on left forearm there is a 1 cm raised papule with significant scaling  MSK: left medial ankle with pain particularly distal to medial malleolus. Some redness and warmth noted in this area. On lateral ankle some  pain around lateral malleolus as well. Good ROM in ankle without significant pain     Assessment/Plan:  Also skin lesion on arm concerns me for skin cancer- will refer back to Dr. Delman Higgins  Insomnia S: [name] working well for insomnia. He would prefer 90 day instead of 30 days.  A/P: I sent this in for him  Gout S: woke up Tuesday morning unable to step on the foot. Noted some swelling behind the ankle. Gets a burning sensation and tingling up to about mid shin. Inability to walk has resolved after taking some indomethacin- perhaps by Wednesday PM for most part. Still noting some swelling. He is still favoring the ankle though and trying to avoid pressure so obviously still with some pain but he reports mild.   I asked him about gout history and he reports had about a year of issues in great toe and there was concern for gout when had uric acid tested and was 7.3. He states never took colchicine due to cost but treated with prednisone and one flare resolved A/P: 72 year old male who sounds like has a history of gout now with ankle swelling, redness, warmth concerning for potential gout vs. Pseudogout flare. We will treat with course of prednisone since nsaids not ideal with his heart history and xarelto use. If fails to improve would get him in with Dr. Paulla Higgins. Likely get uric acid next labs when not in flre and if recurrent issues consider uric acid lowering agent  When I went to add gout to problem list so we could consider diagnosis- opened up originally noted back in 2010- further back then 2013 we initially discussed tonight   Future Appointments  Date Time Provider Felt  08/17/2018  1:45 PM Nathan Lance, MD [facility]   Lab/Order associations: Skin lesion - Plan: Ambulatory referral to Dermatology  Meds ordered this encounter  Medications  . predniSONE (DELTASONE) 20 MG tablet    Sig: Take 1 tablet by mouth daily for 5 days, then 1/2 tablet daily for 2  days    Dispense:  6 tablet    Refill:  0  . zolpidem (AMBIEN) 5 MG tablet    Sig: Take 1 tablet (5 mg total) by mouth at bedtime.    Dispense:  90 tablet    Refill:  1    Return precautions advised.  Nathan Reddish, MD

## 2018-08-15 ENCOUNTER — Encounter: Payer: Self-pay | Admitting: Sports Medicine

## 2018-08-15 NOTE — Procedures (Signed)
PROCEDURE NOTE:  Ultrasound Guided: Injection: Right A1 pulley tendon sheath injection Images were obtained and interpreted by myself, Teresa Coombs, DO  Images have been saved and stored to PACS system. Images obtained on: GE S7 Ultrasound machine    ULTRASOUND FINDINGS:  Triggering and poor glide of the A1 pulley and flexor tendon sheath  DESCRIPTION OF PROCEDURE:  The patient's clinical condition is marked by substantial pain and/or significant functional disability. Other conservative therapy has not provided relief, is contraindicated, or not appropriate. There is a reasonable likelihood that injection will significantly improve the patient's pain and/or functional impairment.   After discussing the risks, benefits and expected outcomes of the injection and all questions were reviewed and answered, the patient wished to undergo the above named procedure.  Verbal consent was obtained.  The ultrasound was used to identify the target structure and adjacent neurovascular structures. The skin was then prepped in sterile fashion and the target structure was injected under direct visualization using sterile technique as below:  Single injection performed as below: PREP: Alcohol, Ethel Chloride and 1 cc 1% lidocaine on Insulin Needle APPROACH:direct, single injection, 25g 1.5 in. INJECTATE: 0.5 cc 1% lidocaine, 0.5 cc 0.5% Marcaine and 0.5 cc 40mg /mL DepoMedrol ASPIRATE: None DRESSING: Band-Aid  Post procedural instructions including recommending icing and warning signs for infection were reviewed.    This procedure was well tolerated and there were no complications.   IMPRESSION: Succesful Ultrasound Guided: Injection

## 2018-08-16 ENCOUNTER — Telehealth: Payer: Self-pay

## 2018-08-16 NOTE — Telephone Encounter (Signed)
Prior Authorization submitted for zolpidem (AMBIEN) 5 MG tablet. Pending at this time

## 2018-08-17 ENCOUNTER — Telehealth: Payer: Self-pay

## 2018-08-17 ENCOUNTER — Ambulatory Visit: Payer: Medicare Other | Admitting: Internal Medicine

## 2018-08-17 ENCOUNTER — Encounter: Payer: Self-pay | Admitting: Internal Medicine

## 2018-08-17 VITALS — BP 112/64 | HR 70 | Ht 68.0 in | Wt 169.0 lb

## 2018-08-17 DIAGNOSIS — I495 Sick sinus syndrome: Secondary | ICD-10-CM | POA: Diagnosis not present

## 2018-08-17 DIAGNOSIS — I4891 Unspecified atrial fibrillation: Secondary | ICD-10-CM

## 2018-08-17 DIAGNOSIS — I4892 Unspecified atrial flutter: Secondary | ICD-10-CM | POA: Diagnosis not present

## 2018-08-17 DIAGNOSIS — Z950 Presence of cardiac pacemaker: Secondary | ICD-10-CM | POA: Diagnosis not present

## 2018-08-17 NOTE — Patient Instructions (Addendum)

## 2018-08-17 NOTE — Telephone Encounter (Signed)
Received a Prior Authorization approval for Zolpidem Tab 5mg  through 11/29/2018 under the Medicare Part D benefit. Pharmacy notified

## 2018-08-17 NOTE — Progress Notes (Signed)
HPI [name] returns today for followup of sinus node dysfunction, CAD, and dyslipidemia. He is a very active 72 yo man with profound sinus node dysfunction s/p PPM who has continued to remain active, traveling. He exercises 5 days a week and has had no anginal symptoms. Recent cholesterol numbers look good. He denies peripheral edema. No syncope. No Known Allergies   Current Outpatient Medications  Medication Sig Dispense Refill  . benazepril (LOTENSIN) 5 MG tablet TAKE ONE TABLET BY MOUTH ONCE DAILY 90 tablet 3  . cyclobenzaprine (FLEXERIL) 5 MG tablet Take 1 tablet (5 mg total) by mouth 3 (three) times daily as needed for muscle spasms. 30 tablet 2  . finasteride (PROSCAR) 5 MG tablet TAKE 1 TABLET BY MOUTH ONCE DAILY 90 tablet 2  . fish oil-omega-3 fatty acids 1000 MG capsule Take 2 g by mouth daily. Reported on 02/18/2016    . gemfibrozil (LOPID) 600 MG tablet TAKE ONE TABLET BY MOUTH TWICE DAILY BEFORE A MEAL(S). 180 tablet 3  . indomethacin (INDOCIN) 25 MG capsule Take 25 mg by mouth 2 (two) times daily as needed (pain). Take with a meal. 3 days maximun    . metoprolol succinate (TOPROL-XL) 25 MG 24 hr tablet Take 1 tablet (25 mg total) by mouth daily. 90 tablet 3  . Multiple Vitamin (MULTIVITAMIN) tablet Take 1 tablet by mouth daily.      . predniSONE (DELTASONE) 20 MG tablet Take 1 tablet by mouth daily for 5 days, then 1/2 tablet daily for 2 days 6 tablet 0  . ranitidine (ZANTAC) 150 MG tablet Take 150 mg by mouth 2 (two) times daily as needed for heartburn.    . rosuvastatin (CRESTOR) 20 MG tablet TAKE ONE TABLET BY MOUTH ONCE DAILY 90 tablet 3  . tadalafil (CIALIS) 5 MG tablet TAKE 1 TABLET BY MOUTH EVERY DAY AS NEEDED FOR erectile dysfunction. 90 tablet 0  . tamsulosin (FLOMAX) 0.4 MG CAPS capsule TAKE ONE CAPSULE BY MOUTH ONCE DAILY. 50 capsule 7  . XARELTO 20 MG TABS tablet TAKE ONE TABLET BY MOUTH ONCE DAILY SUPPER 90 tablet 3  . zolpidem (AMBIEN) 5 MG tablet Take 1  tablet (5 mg total) by mouth at bedtime. 90 tablet 1   No current facility-administered medications for this visit.      Past Medical History:  Diagnosis Date  . Abscess    right posterior neck  . Bradycardia   . CVA (cerebral vascular accident) ([place])   . Elevated LFTs   . External hemorrhoids   . GERD (gastroesophageal reflux disease)   . HTN (hypertension)   . Ischemic optic neuropathy   . Other and unspecified hyperlipidemia   . Sinoatrial node dysfunction (HCC)   . Spinal stenosis     ROS:   All systems reviewed and negative except as noted in the HPI.   Past Surgical History:  Procedure Laterality Date  . ABSCESS DRAINAGE     righ tposterior neck  . CARDIAC CATHETERIZATION  1996  . CHOLECYSTECTOMY  02/07/2003  . COLONOSCOPY  06/23/2004  . EYE SURGERY     left eye  . left ingunial hernia  06/08/2011  . PACEMAKER INSERTION    . right hip replacement     2012 Dr. Maureen Ralphs     Family History  Problem Relation Age of Onset  . Lung cancer Mother        mets to brain  . Heart attack Mother 43  . Hypertension Mother   .  Brain cancer Mother   . Heart attack Father        mid 23s, smoker.   . Diabetes Father   . Diabetes Paternal Grandfather   . Healthy Brother      Social History   Socioeconomic History  . Marital status: Married    Spouse name: Not on file  . Number of children: Not on file  . Years of education: Not on file  . Highest education level: Not on file  Occupational History  . Not on file  Social Needs  . Financial resource strain: Not on file  . Food insecurity:    Worry: Not on file    Inability: Not on file  . Transportation needs:    Medical: Not on file    Non-medical: Not on file  Tobacco Use  . Smoking status: Former Smoker    Packs/day: 1.00    Years: 20.00    Pack years: 20.00    Types: Cigarettes    Last attempt to quit: 11/04/1991    Years since quitting: 26.8  . Smokeless tobacco: Never Used  Substance and Sexual  Activity  . Alcohol use: Yes    Alcohol/week: 0.0 standard drinks    Comment: occ  . Drug use: No  . Sexual activity: Not on file  Lifestyle  . Physical activity:    Days per week: Not on file    Minutes per session: Not on file  . Stress: Not on file  Relationships  . Social connections:    Talks on phone: Not on file    Gets together: Not on file    Attends religious service: Not on file    Active member of club or organization: Not on file    Attends meetings of clubs or organizations: Not on file    Relationship status: Not on file  . Intimate partner violence:    Fear of current or ex partner: Not on file    Emotionally abused: Not on file    Physically abused: Not on file    Forced sexual activity: Not on file  Other Topics Concern  . Not on file  Social History Narrative   Married. 1 child and 1 step child. 2 grandchildren.       Lawyer (part time)      Hobbies: swimming (states not fun), travel     BP 112/64   Pulse 70   Ht 5\' 8"  (1.727 m)   Wt 169 lb (76.7 kg)   SpO2 93%   BMI 25.70 kg/m   Physical Exam:  Well appearing NAD HEENT: Unremarkable Neck:  No JVD, no thyromegally Lymphatics:  No adenopathy Back:  No CVA tenderness Lungs:  Clear HEART:  Regular rate rhythm, no murmurs, no rubs, no clicks Abd:  soft, positive bowel sounds, no organomegally, no rebound, no guarding Ext:  2 plus pulses, no edema, no cyanosis, no clubbing Skin:  No rashes no nodules Neuro:  CN II through XII intact, motor grossly intact  EKG - NSR with atrial pacing  DEVICE  Normal device function.  See PaceArt for details.   Assess/Plan: 1. Sinus node dysfunction - he is s/p PPM insertion and doing well. 2. PPM - his biotronik device is working normally. He has known RV lead dysfucntion and has required unipolar pacing due to elevated bipolar pacing impedence.  3. CAD - he has no anginal symptoms. We will follow. 4. Dyslipidemia - he will continue crestor and I have  asked him  to maintain a low fat diet.   ,M.D. [name].D.

## 2018-09-03 ENCOUNTER — Other Ambulatory Visit: Payer: Self-pay | Admitting: Internal Medicine

## 2018-09-03 DIAGNOSIS — I4891 Unspecified atrial fibrillation: Secondary | ICD-10-CM

## 2018-09-03 DIAGNOSIS — I4892 Unspecified atrial flutter: Principal | ICD-10-CM

## 2018-09-05 NOTE — Telephone Encounter (Signed)
Xarelto 20mg  refill request received; pt is 72 yrs old, wt-76.7kg, Crea-1.27 on 07/04/18, last seen by Dr. Lovena Le on 08/17/18, CrCl-57.30ml/min; will send in refill to requested pharmacy.

## 2018-09-16 ENCOUNTER — Telehealth: Payer: Self-pay | Admitting: Family Medicine

## 2018-09-16 NOTE — Telephone Encounter (Signed)
MEDICATION: tamsulosin (FLOMAX) 0.4 MG CAPS capsule  PHARMACY:   [facility], [place] [address] [phone] (Phone) [phone] (Fax)     IS THIS A 90 DAY SUPPLY : yes  IS PATIENT OUT OF MEDICATION: unsure  IF NOT; HOW MUCH IS LEFT:   LAST APPOINTMENT DATE: @9 /18/2019  NEXT APPOINTMENT DATE:@Visit  date not found  OTHER COMMENTS:    **Let patient know to contact pharmacy at the end of the day to make sure medication is ready. **  ** Please notify patient to allow 48-72 hours to process**  **Encourage patient to contact the pharmacy for refills or they can request refills through [facility]**

## 2018-09-19 ENCOUNTER — Other Ambulatory Visit: Payer: Self-pay

## 2018-09-19 DIAGNOSIS — N401 Enlarged prostate with lower urinary tract symptoms: Secondary | ICD-10-CM

## 2018-09-19 DIAGNOSIS — R351 Nocturia: Principal | ICD-10-CM

## 2018-09-19 MED ORDER — TAMSULOSIN HCL 0.4 MG PO CAPS: 0.4000 mg | ORAL_CAPSULE | Freq: Every day | ORAL | 5 refills | Status: DC

## 2018-09-19 NOTE — Telephone Encounter (Signed)
Prescription sent to pharmacy as requested.

## 2018-11-06 ENCOUNTER — Other Ambulatory Visit: Payer: Self-pay | Admitting: Family Medicine

## 2018-11-14 ENCOUNTER — Ambulatory Visit (INDEPENDENT_AMBULATORY_CARE_PROVIDER_SITE_OTHER): Payer: Medicare Other | Admitting: Sports Medicine

## 2018-11-14 ENCOUNTER — Encounter: Payer: Self-pay | Admitting: Sports Medicine

## 2018-11-14 ENCOUNTER — Ambulatory Visit: Payer: Self-pay

## 2018-11-14 VITALS — BP 120/72 | HR 66 | Ht 68.0 in | Wt 172.8 lb

## 2018-11-14 DIAGNOSIS — M65331 Trigger finger, right middle finger: Secondary | ICD-10-CM | POA: Diagnosis not present

## 2018-11-14 MED ORDER — DICLOFENAC SODIUM 1 % TD GEL: TRANSDERMAL | 1 refills | Status: DC

## 2018-11-14 NOTE — Patient Instructions (Signed)

## 2018-11-14 NOTE — Assessment & Plan Note (Signed)
Overall he does get good intermittent relief with injection.  We did repeat this 1 additional time today but discussed that if any lack of improvement or only short-term improvement once again then ultimately a A1 pulley release would be recommended.  Diclofenac Voltaren gel prescription provided today to be used as needed over this area

## 2018-11-14 NOTE — Progress Notes (Signed)
[name] , [name] [phone]  [name] - 72 y.o. male MRN [id number]  Date of birth: 1946/06/16  Visit Date:   PCP: Marin Olp, MD   Referred by: Marin Olp, MD   SUBJECTIVE:  Chief Complaint  Patient presents with  . Follow-up    R middle finger trigger finger    HPI: Patient presents for worsening triggering of his right middle finger.  He is responded well to that in the past to ultrasound-guided tendon sheath injections in reports symptoms are beginning to return but are not nearly as significant as they were previously.  No significant pain and no forceful extension required at this time.  REVIEW OF SYSTEMS: Denies fevers, chills, recent weight gain or weight loss.  No night sweats. No significant nighttime awakenings due to this issue.  Otherwise negative per prior visits.  HISTORY:  Prior history reviewed and updated per electronic medical record.  Social History   Occupational History  . Not on file  Tobacco Use  . Smoking status: Former Smoker    Packs/day: 1.00    Years: 20.00    Pack years: 20.00    Types: Cigarettes    Last attempt to quit: 11/04/1991    Years since quitting: 27.0  . Smokeless tobacco: Never Used  Substance and Sexual Activity  . Alcohol use: Yes    Alcohol/week: 0.0 standard drinks    Comment: occ  . Drug use: No  . Sexual activity: Not on file   Social History   Social History Narrative   Married. 1 child and 1 step child. 2 grandchildren.       Lawyer (part time)      Hobbies: swimming (states not fun), travel      [place]:  Recent Labs    07/04/18 1442  CALCIUM 10.5  AST 18  ALT 18   Problem  Trigger Finger, Right Middle Finger   No specialty comments available.  OBJECTIVE:  VS:  HT:5\' 8"  (172.7 cm)   WT:172 lb 12.8 oz (78.4 kg)  BMI:26.28    BP:120/72  HR:66bpm  TEMP: ( )  RESP:97 %   PHYSICAL  EXAM: Adult male.  No acute distress.  Alert and appropriate.  Right hand has thickening of the A1 pulley with a slight nodularity and triggering appreciated with flexion extension.  Grip strength is intact.  He is no significant pain with carpal tunnel compression test and no significant thenar atrophy.  No overlying skin changes.   ASSESSMENT  1. Trigger finger, right middle finger     PLAN:  Pertinent additional documentation may be included in corresponding procedure notes, imaging studies, problem based documentation and patient instructions.  Procedures:  US Guided Injection per procedure note  Medications:  Meds ordered this encounter  Medications  . diclofenac sodium (VOLTAREN) 1 % GEL    Sig: Apply topically to affected area qid    Dispense:  100 g    Refill:  1    Discussion/Instructions: Trigger finger, right middle finger Overall he does get good intermittent relief with injection.  We did repeat this 1 additional time today but discussed that if any lack of improvement or only short-term improvement once again then ultimately a A1 pulley release would be recommended.  Diclofenac Voltaren gel prescription provided today to be used as needed over this area  . If any lack of improvement: consider referral to Orthopedics  for A1 pulley release  At follow up will plan: to consider repeat corticosteroid injections Return if symptoms worsen or fail to improve.          Gerda Diss, [facility] Physician

## 2018-11-14 NOTE — Procedures (Signed)
PROCEDURE NOTE:  Ultrasound Guided: Injection: Right 3rd finger Images were obtained and interpreted by myself, Teresa Coombs, DO  Images have been saved and stored to PACS system. Images obtained on: GE S7 Ultrasound machine    ULTRASOUND FINDINGS:  Moderate triggering of the right middle finger with thickening of the A1 pulley as well as slight nodular appearance of the common flexor tendon.  DESCRIPTION OF PROCEDURE:  The patient's clinical condition is marked by substantial pain and/or significant functional disability. Other conservative therapy has not provided relief, is contraindicated, or not appropriate. There is a reasonable likelihood that injection will significantly improve the patient's pain and/or functional impairment.   After discussing the risks, benefits and expected outcomes of the injection and all questions were reviewed and answered, the patient wished to undergo the above named procedure.  Verbal consent was obtained.  The ultrasound was used to identify the target structure and adjacent neurovascular structures. The skin was then prepped in sterile fashion and the target structure was injected under direct visualization using sterile technique as below:  Single injection performed as below: PREP: Alcohol, Ethel Chloride and 1 cc 1% lidocaine on Insulin Needle APPROACH:direct, single injection, 25g 1.5 in. INJECTATE: 0.5 cc 1% lidocaine, 0.5 cc 0.5% Marcaine and 0.5 cc 40mg /mL DepoMedrol ASPIRATE: None DRESSING: Band-Aid  Post procedural instructions including recommending icing and warning signs for infection were reviewed.    This procedure was well tolerated and there were no complications.   IMPRESSION: Succesful Ultrasound Guided: Injection

## 2018-11-15 ENCOUNTER — Telehealth: Payer: Self-pay | Admitting: Physical Therapy

## 2018-11-15 DIAGNOSIS — K219 Gastro-esophageal reflux disease without esophagitis: Secondary | ICD-10-CM

## 2018-11-15 MED ORDER — OMEPRAZOLE 20 MG PO CPDR: 20.0000 mg | DELAYED_RELEASE_CAPSULE | Freq: Every day | ORAL | 3 refills | Status: DC

## 2018-11-15 NOTE — Assessment & Plan Note (Signed)
Unfortunately Zantac has been recalled-we will restart omeprazole 20 mg

## 2018-11-15 NOTE — Addendum Note (Signed)
Addended by: Marin Olp on: 11/15/2018 09:58 PM   Modules accepted: Orders

## 2018-11-15 NOTE — Telephone Encounter (Signed)
Dr. Yong Channel,  [name] was in to see Dr. Paulla Fore yesterday and was wanting a different medication called in to his pharmacy to replace Zantac that he was taking previously.  I spoke to you and you told me to offer him the option of taking OTC Pepcid bid but he didn't like that option stating that he prefers to take something that he only has to take once/day.  Please place whatever prescription you prefer for him.  He stated that he would also prefer a 90 day supply of whatever you choose.  Thanks, [employer]

## 2018-11-15 NOTE — Telephone Encounter (Addendum)
Unfortunately Zantac has been recalled-we will restart omeprazole 20 mg  I sent this into [facility]-you can send to different pharmacy if other preferred.

## 2018-11-16 NOTE — Telephone Encounter (Signed)
Called pt and left VM to call the office.  

## 2018-11-17 NOTE — Telephone Encounter (Signed)
Spoke to pt and he stated he received the vm as well as the Rx. No further action needed!

## 2018-12-06 ENCOUNTER — Other Ambulatory Visit: Payer: Self-pay

## 2018-12-06 MED ORDER — BENAZEPRIL HCL 5 MG PO TABS: 5.0000 mg | ORAL_TABLET | Freq: Every day | ORAL | 3 refills | Status: DC

## 2018-12-06 NOTE — Progress Notes (Signed)
Refilled benazepril per Pt request.

## 2019-01-31 ENCOUNTER — Encounter: Payer: Self-pay | Admitting: Internal Medicine

## 2019-02-02 ENCOUNTER — Other Ambulatory Visit: Payer: Self-pay | Admitting: Family Medicine

## 2019-02-13 ENCOUNTER — Telehealth: Payer: Self-pay

## 2019-02-13 NOTE — Telephone Encounter (Signed)
Call placed to Pt.  Spoke with Pt wife.  Advised at this time-routine follow up appointments are being cancelled to reduce possible exposure for our patients.  Per wife Pt-feels good.  No issues at this time.  Advised appointment would be cancelled and Pt would be contacted to reschedule.  Advised to call office if any needs.

## 2019-02-15 ENCOUNTER — Encounter: Payer: Medicare Other | Admitting: Internal Medicine

## 2019-03-04 ENCOUNTER — Other Ambulatory Visit: Payer: Self-pay | Admitting: Family Medicine

## 2019-03-04 ENCOUNTER — Other Ambulatory Visit: Payer: Self-pay | Admitting: Internal Medicine

## 2019-03-04 DIAGNOSIS — R351 Nocturia: Principal | ICD-10-CM

## 2019-03-04 DIAGNOSIS — N401 Enlarged prostate with lower urinary tract symptoms: Secondary | ICD-10-CM

## 2019-04-06 ENCOUNTER — Telehealth: Payer: Self-pay | Admitting: Internal Medicine

## 2019-04-06 NOTE — Telephone Encounter (Signed)
New message   Spoke w/ pt and scheduled virtual visit with Dr. Lovena Le. Pt will do doxemity video, pt smart phone number is listed in appt notes.      Virtual Visit Pre-Appointment Phone Call  "(Name), I am calling you today to discuss your upcoming appointment. We are currently trying to limit exposure to the virus that causes COVID-19 by seeing patients at home rather than in the office."  1. "What is the BEST phone number to call the day of the visit?" - include this in appointment notes  2. Do you have or have access to (through a family member/friend) a smartphone with video capability that we can use for your visit?" a. If yes - list this number in appt notes as cell (if different from BEST phone #) and list the appointment type as a VIDEO visit in appointment notes b. If no - list the appointment type as a PHONE visit in appointment notes  3. Confirm consent - "In the setting of the current Covid19 crisis, you are scheduled for a (phone or video) visit with your provider on (date) at (time).  Just as we do with many in-office visits, in order for you to participate in this visit, we must obtain consent.  If you'd like, I can send this to your mychart (if signed up) or email for you to review.  Otherwise, I can obtain your verbal consent now.  All virtual visits are billed to your insurance company just like a normal visit would be.  By agreeing to a virtual visit, we'd like you to understand that the technology does not allow for your provider to perform an examination, and thus may limit your provider's ability to fully assess your condition. If your provider identifies any concerns that need to be evaluated in person, we will make arrangements to do so.  Finally, though the technology is pretty good, we cannot assure that it will always work on either your or our end, and in the setting of a video visit, we may have to convert it to a phone-only visit.  In either situation, we cannot  ensure that we have a secure connection.  Are you willing to proceed?" STAFF: Did the patient verbally acknowledge consent to telehealth visit? Document YES/NO here: YES  4. Advise patient to be prepared - "Two hours prior to your appointment, go ahead and check your blood pressure, pulse, oxygen saturation, and your weight (if you have the equipment to check those) and write them all down. When your visit starts, your provider will ask you for this information. If you have an Apple Watch or Kardia device, please plan to have heart rate information ready on the day of your appointment. Please have a pen and paper handy nearby the day of the visit as well."  5. Give patient instructions for MyChart download to smartphone OR Doximity/Doxy.me as below if video visit (depending on what platform provider is using)  6. Inform patient they will receive a phone call 15 minutes prior to their appointment time (may be from unknown caller ID) so they should be prepared to answer    TELEPHONE CALL NOTE  [name] has been deemed a candidate for a follow-up tele-health visit to limit community exposure during the Covid-19 pandemic. I spoke with the patient via phone to ensure availability of phone/video source, confirm preferred email & phone number, and discuss instructions and expectations.  I reminded [name] to be prepared with any vital  sign and/or heart rhythm information that could potentially be obtained via home monitoring, at the time of his visit. I reminded [name] to expect a phone call prior to his visit.  Alben Spittle 04/06/2019 3:43 PM   INSTRUCTIONS FOR DOWNLOADING THE MYCHART APP TO SMARTPHONE  - The patient must first make sure to have activated MyChart and know their login information - If Apple, go to [employer] and type in MyChart in the search bar and download the app. If Android, ask patient to go to [employer] and type in [place] in the search  bar and download the app. The app is free but as with any other app downloads, their phone may require them to verify saved payment information or Apple/Android password.  - The patient will need to then log into the app with their MyChart username and password, and select [facility] as their healthcare provider to link the account. When it is time for your visit, go to the MyChart app, find appointments, and click Begin Video Visit. Be sure to Select Allow for your device to access the Microphone and Camera for your visit. You will then be connected, and your provider will be with you shortly.  **If they have any issues connecting, or need assistance please contact MyChart service desk [phone])**  **If using a computer, in order to ensure the best quality for their visit they will need to use either of the following Internet Browsers: [employer], or Google Chrome**  IF USING DOXIMITY or DOXY.ME - The patient will receive a link just prior to their visit by text.     FULL LENGTH CONSENT FOR TELE-HEALTH VISIT   I hereby voluntarily request, consent and authorize [facility] and its employed or contracted physicians, physician assistants, nurse practitioners or other licensed health care professionals (the Practitioner), to provide me with telemedicine health care services (the Services") as deemed necessary by the treating Practitioner. I acknowledge and consent to receive the Services by the Practitioner via telemedicine. I understand that the telemedicine visit will involve communicating with the Practitioner through live audiovisual communication technology and the disclosure of certain medical information by electronic transmission. I acknowledge that I have been given the opportunity to request an in-person assessment or other available alternative prior to the telemedicine visit and am voluntarily participating in the telemedicine visit.  I understand that I have the  right to withhold or withdraw my consent to the use of telemedicine in the course of my care at any time, without affecting my right to future care or treatment, and that the Practitioner or I may terminate the telemedicine visit at any time. I understand that I have the right to inspect all information obtained and/or recorded in the course of the telemedicine visit and may receive copies of available information for a reasonable fee.  I understand that some of the potential risks of receiving the Services via telemedicine include:   Delay or interruption in medical evaluation due to technological equipment failure or disruption;  Information transmitted may not be sufficient (e.g. poor resolution of images) to allow for appropriate medical decision making by the Practitioner; and/or   In rare instances, security protocols could fail, causing a breach of personal health information.  Furthermore, I acknowledge that it is my responsibility to provide information about my medical history, conditions and care that is complete and accurate to the best of my ability. I acknowledge that Practitioner's advice, recommendations, and/or decision may  be based on factors not within their control, such as incomplete or inaccurate data provided by me or distortions of diagnostic images or specimens that may result from electronic transmissions. I understand that the practice of medicine is not an exact science and that Practitioner makes no warranties or guarantees regarding treatment outcomes. I acknowledge that I will receive a copy of this consent concurrently upon execution via email to the email address I last provided but may also request a printed copy by calling the office of [facility].    I understand that my insurance will be billed for this visit.   I have read or had this consent read to me.  I understand the contents of this consent, which adequately explains the benefits and risks of the Services  being provided via telemedicine.   I have been provided ample opportunity to ask questions regarding this consent and the Services and have had my questions answered to my satisfaction.  I give my informed consent for the services to be provided through the use of telemedicine in my medical care  By participating in this telemedicine visit I agree to the above.

## 2019-04-13 ENCOUNTER — Other Ambulatory Visit: Payer: Self-pay

## 2019-04-13 ENCOUNTER — Telehealth (INDEPENDENT_AMBULATORY_CARE_PROVIDER_SITE_OTHER): Payer: Medicare Other | Admitting: Internal Medicine

## 2019-04-13 DIAGNOSIS — I495 Sick sinus syndrome: Secondary | ICD-10-CM | POA: Diagnosis not present

## 2019-04-13 DIAGNOSIS — R911 Solitary pulmonary nodule: Secondary | ICD-10-CM

## 2019-04-13 DIAGNOSIS — Z950 Presence of cardiac pacemaker: Secondary | ICD-10-CM

## 2019-04-13 DIAGNOSIS — R079 Chest pain, unspecified: Secondary | ICD-10-CM

## 2019-04-13 DIAGNOSIS — I4891 Unspecified atrial fibrillation: Secondary | ICD-10-CM

## 2019-04-13 NOTE — Progress Notes (Signed)
Electrophysiology TeleHealth Note   Due to national recommendations of social distancing due to COVID 19, an audio/video telehealth visit is felt to be most appropriate for this patient at this time.  See MyChart message from today for the patient's consent to telehealth for [facility].   Date:  04/13/2019   ID:  [name], DOB [birth year], MRN [id number]  Location: patient's home  Provider location: [address] [place]  Evaluation Performed: Follow-up visit  PCP:  Marin Olp, MD  Cardiologist:  No primary care provider on file.  Electrophysiologist:  Dr Lovena Le  Chief Complaint:  "I am doing ok I guess."  History of Present Illness:    [name] is a 73 y.o. male who presents via audio/video conferencing for a telehealth visit today. He is a pleasant 73 yo man with sinus node dysfunction, s/p PM insertion, CAD, PAF, and dyslipidemia.  Since last being seen in our clinic, the patient reports doing very well except for some burning sensations in his feet at night. He also notes pleuritic pain along his left rib. He has a h/o pulmonary nodule and his mother died of metastatic lung CA. He reduced his Crestor dose. He is not swimming due to Covid pandemic.  Today, he denies symptoms of palpitations, chest pain, shortness of breath,  lower extremity edema, dizziness, presyncope, or syncope.  The patient is otherwise without complaint today.  The patient denies symptoms of fevers, chills, cough, or new SOB worrisome for COVID 19.  Past Medical History:  Diagnosis Date  . Abscess    right posterior neck  . Bradycardia   . CVA (cerebral vascular accident) ([place])   . Elevated LFTs   . External hemorrhoids   . GERD (gastroesophageal reflux disease)   . HTN (hypertension)   . Ischemic optic neuropathy   . Other and unspecified hyperlipidemia   . Sinoatrial node dysfunction (HCC)   . Spinal stenosis     Past Surgical History:  Procedure  Laterality Date  . ABSCESS DRAINAGE     righ tposterior neck  . CARDIAC CATHETERIZATION  1996  . CHOLECYSTECTOMY  02/07/2003  . COLONOSCOPY  06/23/2004  . EYE SURGERY     left eye  . left ingunial hernia  06/08/2011  . PACEMAKER INSERTION    . right hip replacement     2012 Dr. Maureen Ralphs    Current Outpatient Medications  Medication Sig Dispense Refill  . benazepril (LOTENSIN) 5 MG tablet Take 1 tablet by mouth once daily 90 tablet 0  . cyclobenzaprine (FLEXERIL) 5 MG tablet Take 1 tablet (5 mg total) by mouth 3 (three) times daily as needed for muscle spasms. 30 tablet 2  . diclofenac sodium (VOLTAREN) 1 % GEL Apply topically to affected area qid 100 g 1  . finasteride (PROSCAR) 5 MG tablet Take 1 tablet by mouth once daily 90 tablet 0  . fish oil-omega-3 fatty acids 1000 MG capsule Take 2 g by mouth daily. Reported on 02/18/2016    . gemfibrozil (LOPID) 600 MG tablet TAKE 1 TABLET BY MOUTH TWICE DAILY BEFORE A MEAL(S). 180 tablet 1  . indomethacin (INDOCIN) 25 MG capsule Take 25 mg by mouth 2 (two) times daily as needed (pain). Take with a meal. 3 days maximun    . metoprolol succinate (TOPROL-XL) 25 MG 24 hr tablet Take 1 tablet (25 mg total) by mouth daily. 90 tablet 3  . Multiple Vitamin (MULTIVITAMIN) tablet Take 1 tablet by  mouth daily.      Marland Kitchen omeprazole (PRILOSEC) 20 MG capsule Take 1 capsule (20 mg total) by mouth daily. 90 capsule 3  . rosuvastatin (CRESTOR) 20 MG tablet TAKE ONE TABLET BY MOUTH ONCE DAILY 90 tablet 3  . tadalafil (CIALIS) 5 MG tablet TAKE ONE TABLET BY MOUTH DAILY AS NEEDED FOR ERECTILE DYSFUNCTION 90 tablet 0  . tamsulosin (FLOMAX) 0.4 MG CAPS capsule Take 1 capsule by mouth once daily 90 capsule 0  . XARELTO 20 MG TABS tablet TAKE 1 TABLET BY MOUTH ONCE DAILY WITH  SUPPER 90 tablet 3  . zolpidem (AMBIEN) 5 MG tablet Take 1 tablet (5 mg total) by mouth at bedtime. 90 tablet 1   No current facility-administered medications for this visit.     Allergies:    Patient has no known allergies.   Social History:  The patient  reports that he quit smoking about 27 years ago. His smoking use included cigarettes. He has a 20.00 pack-year smoking history. He has never used smokeless tobacco. He reports current alcohol use. He reports that he does not use drugs.   Family History:  The patient's  family history includes Brain cancer in his mother; Diabetes in his father and paternal grandfather; Healthy in his brother; Heart attack in his father; Heart attack (age of onset: 70) in his mother; Hypertension in his mother; Lung cancer in his mother.   ROS:  Please see the history of present illness.   All other systems are personally reviewed and negative.    Exam:    Vital Signs:  There were no vitals taken for this visit.  Well appearing, alert and conversant, regular work of breathing,  good skin color Eyes- anicteric, neuro- grossly intact, skin- no apparent rash or lesions or cyanosis, mouth- oral mucosa is pink   Labs/Other Tests and Data Reviewed:    Recent Labs: 07/04/2018: ALT 18; BUN 23; Creatinine, Ser 1.27; Hemoglobin 15.5; Platelets 262.0; Potassium 4.7; Sodium 140   Wt Readings from Last 3 Encounters:  11/14/18 172 lb 12.8 oz (78.4 kg)  08/17/18 169 lb (76.7 kg)  08/12/18 168 lb (76.2 kg)     Other studies personally reviewed: Additional studies/ records that were reviewed today include:   Review of the above records today demonstrates: as above Prior radiographs: reviewed CT scans from 2018 and 2017.   ASSESSMENT & PLAN:    1.  Sinus node dysfunction - he is s/p PPM and is asymptomatic. 2. PPM - we will have him come to the office in 3 months and then start doing over the phone visits. 3. PAF - he is asymptomatic. Continue systemic anti-coagulation. 4. Pleuritic pain - he will be scheduled for a chest CT. 5. CAD - he is not as active but has not had any anginal symptoms.  6. COVID 19 screen The patient denies symptoms of COVID 19  at this time.  The importance of social distancing was discussed today.  Follow-up:  3 months Next remote: n/a  Current medicines are reviewed at length with the patient today.   The patient does not have concerns regarding his medicines.  The following changes were made today:  none  Labs/ tests ordered today include: CT of the chest No orders of the defined types were placed in this encounter.    Patient Risk:  after full review of this patients clinical status, I feel that they are at moderate risk at this time.  Today, I have spent 25 minutes  with the patient with telehealth technology discussing all of the above.    Signed, Cristopher Peru, MD  04/13/2019 12:16 PM     [address] [place] [address] [phone] (office) [phone] (fax)

## 2019-04-17 NOTE — Addendum Note (Signed)
Addended by: Willeen Cass A on: 04/17/2019 07:38 AM   Modules accepted: Orders

## 2019-04-18 ENCOUNTER — Encounter: Payer: Self-pay | Admitting: Family Medicine

## 2019-04-18 ENCOUNTER — Ambulatory Visit (INDEPENDENT_AMBULATORY_CARE_PROVIDER_SITE_OTHER): Payer: Medicare Other | Admitting: Family Medicine

## 2019-04-18 VITALS — BP 135/71 | HR 61 | Temp 97.6°F | Ht 68.0 in | Wt 172.0 lb

## 2019-04-18 DIAGNOSIS — G4700 Insomnia, unspecified: Secondary | ICD-10-CM

## 2019-04-18 DIAGNOSIS — R739 Hyperglycemia, unspecified: Secondary | ICD-10-CM

## 2019-04-18 DIAGNOSIS — R202 Paresthesia of skin: Secondary | ICD-10-CM | POA: Diagnosis not present

## 2019-04-18 DIAGNOSIS — N529 Male erectile dysfunction, unspecified: Secondary | ICD-10-CM

## 2019-04-18 MED ORDER — ZOLPIDEM TARTRATE 5 MG PO TABS: 5.0000 mg | ORAL_TABLET | Freq: Every day | ORAL | 1 refills | Status: DC

## 2019-04-18 MED ORDER — TADALAFIL 5 MG PO TABS: ORAL_TABLET | ORAL | 3 refills | Status: DC

## 2019-04-18 NOTE — Progress Notes (Signed)
Phone [phone]   Subjective:  Virtual visit via Video note. Chief complaint: Chief Complaint  Patient presents with  . Foot Problem    Sx include burning ssensation in toes and feet. Pt stated that sx started years ago. Sx are just getting worse.   This visit type was conducted due to national recommendations for restrictions regarding the COVID-19 Pandemic (e.g. social distancing).  This format is felt to be most appropriate for this patient at this time balancing risks to patient and risks to population by having him in for in person visit.  No physical exam was performed (except for noted visual exam or audio findings with Telehealth visits).    Our team/I connected with [name] at  3:40 PM EDT by a video enabled telemedicine application (doxy.me or caregility through epic) and verified that I am speaking with the correct person using two identifiers.  Location patient: Home[place] Location provider: [facility], office Persons participating in the virtual visit:  patient  Our team/I discussed the limitations of evaluation and management by telemedicine and the availability of in person appointments. In light of current covid-19 pandemic, patient also understands that we are trying to protect them by minimizing in office contact if at all possible.  The patient expressed consent for telemedicine visit and agreed to proceed. Patient understands insurance will be billed.   ROS- No fever, rigourous chills (occasional shiver), cough, shortness of breath, new body aches, sore throat, or loss of taste or smell   Past Medical History-  Patient Active Problem List   Diagnosis Date Noted  . CAD (coronary artery disease) 05/26/2016    Priority: High  . Nonischemic cardiomyopathy ([place]) 04/23/2016    Priority: High  . CVA (cerebral infarction) 01/30/2015    Priority: High  . Atrial fibrillation and flutter ([place]) 01/01/2015    Priority: High  . Ventricular tachycardia, non-sustained  ([place]) 01/25/2013    Priority: High  . Cardiac pacemaker in situ 12/28/2009    Priority: High  . Hyperglycemia 06/10/2017    Priority: Medium  . Insomnia 01/30/2015    Priority: Medium  . BPH associated with nocturia 08/14/2014    Priority: Medium  . Hyperlipidemia 07/12/2007    Priority: Medium  . NEUROPATHY, ISCHEMIC OPTIC 06/02/2007    Priority: Medium  . Essential hypertension 06/02/2007    Priority: Medium  . Solitary pulmonary nodule 05/26/2016    Priority: Low  . Left chest pressure 03/19/2015    Priority: Low  . Erectile dysfunction 01/30/2015    Priority: Low  . Former smoker 01/30/2015    Priority: Low  . Allergic rhinitis 10/11/2014    Priority: Low  . Sinoatrial node dysfunction (HCC)     Priority: Low  . SPINAL STENOSIS, LUMBAR 08/22/2010    Priority: Low  . GERD 03/17/2010    Priority: Low  . Double vision 07/04/2018  . Trigger finger, right middle finger 03/01/2018  . Gout 11/11/2009    Medications- reviewed and updated Current Outpatient Medications  Medication Sig Dispense Refill  . benazepril (LOTENSIN) 5 MG tablet Take 1 tablet by mouth once daily 90 tablet 0  . cyclobenzaprine (FLEXERIL) 5 MG tablet Take 1 tablet (5 mg total) by mouth 3 (three) times daily as needed for muscle spasms. 30 tablet 2  . diclofenac sodium (VOLTAREN) 1 % GEL Apply topically to affected area qid 100 g 1  . finasteride (PROSCAR) 5 MG tablet Take 1 tablet by mouth once daily 90 tablet 0  . fish  oil-omega-3 fatty acids 1000 MG capsule Take 2 g by mouth daily. Reported on 02/18/2016    . gemfibrozil (LOPID) 600 MG tablet TAKE 1 TABLET BY MOUTH TWICE DAILY BEFORE A MEAL(S). 180 tablet 1  . indomethacin (INDOCIN) 25 MG capsule Take 25 mg by mouth 2 (two) times daily as needed (pain). Take with a meal. 3 days maximun    . metoprolol succinate (TOPROL-XL) 25 MG 24 hr tablet Take 1 tablet (25 mg total) by mouth daily. 90 tablet 3  . Multiple Vitamin (MULTIVITAMIN) tablet Take 1  tablet by mouth daily.      Marland Kitchen omeprazole (PRILOSEC) 20 MG capsule Take 1 capsule (20 mg total) by mouth daily. 90 capsule 3  . rosuvastatin (CRESTOR) 20 MG tablet TAKE ONE TABLET BY MOUTH ONCE DAILY 90 tablet 3  . tadalafil (CIALIS) 5 MG tablet TAKE ONE TABLET BY MOUTH DAILY AS NEEDED FOR ERECTILE DYSFUNCTION 90 tablet 3  . tamsulosin (FLOMAX) 0.4 MG CAPS capsule Take 1 capsule by mouth once daily 90 capsule 0  . XARELTO 20 MG TABS tablet TAKE 1 TABLET BY MOUTH ONCE DAILY WITH  SUPPER 90 tablet 3  . zolpidem (AMBIEN) 5 MG tablet Take 1 tablet (5 mg total) by mouth at bedtime. 90 tablet 1   No current facility-administered medications for this visit.      Objective:  BP 135/71   Pulse 61   Temp 97.6 F (36.4 C) (Oral)   Ht 5\' 8"  (1.727 m)   Wt 172 lb (78 kg)   BMI 26.15 kg/m  self reported vitals Gen: NAD, resting comfortably Lungs: nonlabored, normal respiratory rate  Skin: appears dry, no obvious rash     Assessment and Plan   # Paresthesias/burning in feet S: patient with burning/tingling sensation in thebottom of his feet. Noted back to 09/21/13 in chart with Dr. Leanne Chang- last night whole sole of foot feels like it is burning. If he walks a lot tends to worsen. Can bother him at night- feels like feet flying off his feet but feet are not warm to touch. Seems to go up to his ankles. Toes feel "fat" and too close together". Occasionally gets sharp stinging nerve pain. Pain is more annoying than cant stand pain. 3/10 at its worst. Feels like it has worsened over the last year. Legs do not feel weak. He is able to walk- now that he cant swim- is walking half an hour for 1.5 miles or so but legs feel sore- particularly in upper thigh. No new incontinence (has BPH). 2-4 alcoholic beverages per week.   Does have lumbar stenosis.  A/P: Patient with paresthesias in his feet along with pain in feet for now over 5 years but recently worsening concerning for neuropathy.  -elevated cbg last  labs- get a1c -get basic tsh, b12, cbc, cmp as well -minimal alcohol intake- doubt alcohol related neuropathy -has lumbar stenosis which could contribute -offered neuro referral- he declines   Erectile dysfunction S: cialis 5 mg has been so affordable that he has just been taking it daily. Doesn't always produce most firmness/best result A/P: advised patient that could try 10-20 mg every 72 hours instead of taking 5 mg daily- he will consider. Offered new rx but wants to hold off for now   Insomnia S: insomnia reasonably controlled on ambien 5 mg. No falls, abnormal dreams, sleepwalking, trouble driving next day  A/P:Stable. Continue current medications.      He is going to come by for a  car lab visit Future Appointments  Date Time Provider [place]  04/19/2019 11:15 AM [facility]  07/27/2019  3:15 PM Evans Lance, MD [facility]   Lab/Order associations: Hyperglycemia - Plan: Hemoglobin A1c, CBC, Comprehensive metabolic panel  Paresthesias - Plan: TSH, Vitamin B12, CBC, Comprehensive metabolic panel  Erectile dysfunction, unspecified erectile dysfunction type  Insomnia, unspecified type  Meds ordered this encounter  Medications  . zolpidem (AMBIEN) 5 MG tablet    Sig: Take 1 tablet (5 mg total) by mouth at bedtime.    Dispense:  90 tablet    Refill:  1  . DISCONTD: tadalafil (CIALIS) 5 MG tablet    Sig: TAKE ONE TABLET BY MOUTH DAILY AS NEEDED FOR ERECTILE DYSFUNCTION    Dispense:  90 tablet    Refill:  3  . tadalafil (CIALIS) 5 MG tablet    Sig: TAKE ONE TABLET BY MOUTH DAILY AS NEEDED FOR ERECTILE DYSFUNCTION    Dispense:  90 tablet    Refill:  3    Return precautions advised.  Nathan Reddish, MD

## 2019-04-18 NOTE — Patient Instructions (Signed)
Health Maintenance Due  Topic Date Due  . TETANUS/TDAP  10/04/2018    Depression screen [facility] 2/9 07/04/2018 07/06/2017 06/09/2017  Decreased Interest 0 0 0  Down, Depressed, Hopeless 0 0 0  PHQ - 2 Score 0 0 0

## 2019-04-18 NOTE — Assessment & Plan Note (Signed)
S: insomnia reasonably controlled on ambien 5 mg. No falls, abnormal dreams, sleepwalking, trouble driving next day  A/P:Stable. Continue current medications.

## 2019-04-18 NOTE — Assessment & Plan Note (Signed)
S: cialis 5 mg has been so affordable that he has just been taking it daily. Doesn't always produce most firmness/best result A/P: advised patient that could try 10-20 mg every 72 hours instead of taking 5 mg daily- he will consider. Offered new rx but wants to hold off for now

## 2019-04-19 ENCOUNTER — Other Ambulatory Visit (INDEPENDENT_AMBULATORY_CARE_PROVIDER_SITE_OTHER): Payer: Medicare Other

## 2019-04-19 ENCOUNTER — Telehealth: Payer: Self-pay | Admitting: *Deleted

## 2019-04-19 DIAGNOSIS — R739 Hyperglycemia, unspecified: Secondary | ICD-10-CM

## 2019-04-19 DIAGNOSIS — R202 Paresthesia of skin: Secondary | ICD-10-CM | POA: Diagnosis not present

## 2019-04-19 LAB — TSH: TSH: 3.84 u[IU]/mL (ref 0.35–4.50)

## 2019-04-19 LAB — COMPREHENSIVE METABOLIC PANEL
ALT: 15 U/L (ref 0–53)
AST: 14 U/L (ref 0–37)
Albumin: 4.6 g/dL (ref 3.5–5.2)
Alkaline Phosphatase: 78 U/L (ref 39–117)
BUN: 16 mg/dL (ref 6–23)
CO2: 27 mEq/L (ref 19–32)
Calcium: 9.1 mg/dL (ref 8.4–10.5)
Chloride: 105 mEq/L (ref 96–112)
Creatinine, Ser: 1.04 mg/dL (ref 0.40–1.50)
GFR: 70.1 mL/min (ref >60.00)
Glucose, Bld: 111 mg/dL — ABNORMAL HIGH (ref 70–99)
Potassium: 4 mEq/L (ref 3.5–5.1)
Sodium: 139 mEq/L (ref 135–145)
Total Bilirubin: 0.6 mg/dL (ref 0.2–1.2)
Total Protein: 7 g/dL (ref 6.0–8.3)

## 2019-04-19 LAB — HEMOGLOBIN A1C: Hgb A1c MFr Bld: 5.9 % (ref 4.6–6.5)

## 2019-04-19 LAB — CBC
HCT: 43.5 % (ref 39.0–52.0)
Hemoglobin: 15 g/dL (ref 13.0–17.0)
MCHC: 34.4 g/dL (ref 30.0–36.0)
MCV: 88.4 fl (ref 78.0–100.0)
Platelets: 273 10*3/uL (ref 150.0–400.0)
RBC: 4.92 Mil/uL (ref 4.22–5.81)
RDW: 14.1 % (ref 11.5–15.5)
WBC: 6.3 10*3/uL (ref 4.0–10.5)

## 2019-04-19 LAB — VITAMIN B12: Vitamin B-12: 352 pg/mL (ref 211–911)

## 2019-04-19 NOTE — Telephone Encounter (Signed)

## 2019-04-21 ENCOUNTER — Ambulatory Visit (INDEPENDENT_AMBULATORY_CARE_PROVIDER_SITE_OTHER)
Admission: RE | Admit: 2019-04-21 | Discharge: 2019-04-21 | Disposition: A | Discharge status: Home / Self Care | Payer: Medicare Other | Source: Ambulatory Visit | Attending: Internal Medicine | Admitting: Internal Medicine

## 2019-04-21 ENCOUNTER — Telehealth: Payer: Self-pay

## 2019-04-21 ENCOUNTER — Other Ambulatory Visit: Payer: Self-pay

## 2019-04-21 DIAGNOSIS — R911 Solitary pulmonary nodule: Secondary | ICD-10-CM

## 2019-04-21 DIAGNOSIS — R079 Chest pain, unspecified: Secondary | ICD-10-CM

## 2019-04-21 NOTE — Telephone Encounter (Signed)
Alm [employer]ard Key: [id number] - PA Case ID: [id number] - Rx #: [id number]  Need help? Call us at [phone]    Status  Sent to [employer]   Drug Zolpidem Tartrate 5MG  tablets Form [employer] Medicare Part D Electronic Prior Authorization Form (2017 NCPDP)   Original Claim Info [phone] Provide Exception Process Printed NoticeDr. Submit ePA at [email or web address] Requires Prior Authorization

## 2019-04-25 NOTE — Telephone Encounter (Signed)
Will call pt to update.

## 2019-04-25 NOTE — Telephone Encounter (Signed)
Alm Bustard Key: [id number] - PA Case ID: [id number] - Rx #: [id number]  Need help? Call us at [phone]    Outcome  Approvedon May 22 Request Reference Number: [id number]. ZOLPIDEM TAB 5MG  is approved through 11/30/2019. For further questions, call [phone].   Drug Zolpidem Tartrate 5MG  tablets Form [employer] Medicare Part D Electronic Prior Authorization Form (2017 NCPDP)   Original Claim Info [phone] Provide Exception Process Printed NoticeDr. Submit ePA at [email or web address] Requires Prior Authorization

## 2019-04-25 NOTE — Telephone Encounter (Signed)
Pt notified via vm. Pt was notified to call us back if he has any questions.  No further action needed!

## 2019-05-18 ENCOUNTER — Encounter: Payer: Self-pay | Admitting: Family Medicine

## 2019-05-19 ENCOUNTER — Telehealth: Payer: Self-pay | Admitting: Family Medicine

## 2019-05-19 MED ORDER — CYCLOBENZAPRINE HCL 5 MG PO TABS: 5.0000 mg | ORAL_TABLET | Freq: Three times a day (TID) | ORAL | 2 refills | Status: DC | PRN

## 2019-05-19 NOTE — Telephone Encounter (Signed)
OK to refill per Dr. Yong Channel. Rx refilled to [facility] per pt request. Called pt and advised.

## 2019-05-19 NOTE — Telephone Encounter (Signed)
CRM for notification. See Telephone encounter for: 05/19/19.   Pt called in to follow up on his my-chart message sent. Pt says that he would like to have Rx before the weekend if possible.   Pt would like to be advised.

## 2019-05-23 ENCOUNTER — Encounter: Payer: Self-pay | Admitting: Family Medicine

## 2019-05-23 DIAGNOSIS — E785 Hyperlipidemia, unspecified: Secondary | ICD-10-CM

## 2019-05-23 NOTE — Telephone Encounter (Signed)
Dr. Yong Channel okay to order Lipid panel?

## 2019-05-24 ENCOUNTER — Other Ambulatory Visit: Payer: Self-pay

## 2019-05-24 DIAGNOSIS — E785 Hyperlipidemia, unspecified: Secondary | ICD-10-CM

## 2019-05-24 NOTE — Telephone Encounter (Signed)
Please call pt and schedule lab appointment only. Orders are in [place].

## 2019-05-25 ENCOUNTER — Other Ambulatory Visit: Payer: Medicare Other

## 2019-05-25 ENCOUNTER — Other Ambulatory Visit: Payer: Self-pay

## 2019-05-25 ENCOUNTER — Other Ambulatory Visit (INDEPENDENT_AMBULATORY_CARE_PROVIDER_SITE_OTHER): Payer: Medicare Other

## 2019-05-25 DIAGNOSIS — E785 Hyperlipidemia, unspecified: Secondary | ICD-10-CM | POA: Diagnosis not present

## 2019-05-25 LAB — LIPID PANEL
Cholesterol: 154 mg/dL (ref 0–200)
HDL: 36.6 mg/dL — ABNORMAL LOW (ref >39.00)
NonHDL: 117.11
Total CHOL/HDL Ratio: 4
Triglycerides: 226 mg/dL — ABNORMAL HIGH (ref 0.0–149.0)
VLDL: 45.2 mg/dL — ABNORMAL HIGH (ref 0.0–40.0)

## 2019-05-25 LAB — LDL CHOLESTEROL, DIRECT: Direct LDL: 91 mg/dL

## 2019-05-27 ENCOUNTER — Other Ambulatory Visit: Payer: Self-pay | Admitting: Family Medicine

## 2019-06-01 ENCOUNTER — Encounter: Payer: Self-pay | Admitting: Family Medicine

## 2019-06-01 DIAGNOSIS — G629 Polyneuropathy, unspecified: Secondary | ICD-10-CM

## 2019-06-05 ENCOUNTER — Other Ambulatory Visit: Payer: Self-pay | Admitting: Family Medicine

## 2019-06-06 NOTE — Progress Notes (Signed)
New Patient Virtual Visit via Video Note The purpose of this virtual visit is to provide medical care while limiting exposure to the novel coronavirus.    Consent was obtained for video visit:  Yes.   Answered questions that patient had about telehealth interaction:  Yes.   I discussed the limitations, risks, security and privacy concerns of performing an evaluation and management service by telemedicine. I also discussed with the patient that there may be a patient responsible charge related to this service. The patient expressed understanding and agreed to proceed.  Pt location: Home Physician Location: office Name of referring provider:  Marin Olp, MD I connected with Hermina Barters at patients initiation/request on 06/07/2019 at  7:50 AM EDT by video enabled telemedicine application and verified that I am speaking with the correct person using two identifiers. Pt MRN:  [id number] Pt DOB:  [birth year] Video Participants:  Tillie Rung Bahar    History of Present Illness: [name] is a 73 y.o. right-handed male with GERD, HTN, SA node dysfunction s/p PPM, atrial fibrillation on Xarelto, and bilateral ischemic optic neuropathy presenting for evaluation of bilateral feet pain.   Starting around 2017, he began having discomfort of the feet which he initially attributed to walking too much.  Over the past year, he has increased burning sensation over the soles of the feet and brief severe electrical-shock like sensation.  Symptoms are constant and not alleviated with anything.  He endorses imbalance.  He walks unassisted and has not suffered any falls.  He is an avid traveler and feels that after a long day of walking, his feet burn which limits further travel on subsequent days.  He has mild chronic intermittent back pain, but does not have any radicular leg pain.  There is no history of diabetes/alcohol abuse, vitamin deficiency, or thyroid problems.  He has impaired  peripheral vision in the left eye from ischemic optic neuropathy, which occurred about 16 years ago.   Out-side paper records, electronic medical record, and images have been reviewed where available and summarized as:  Lab Results  Component Value Date   HGBA1C 5.9 04/19/2019   Lab Results  Component Value Date   [id number] 573 04/19/2019   Lab Results  Component Value Date   TSH 3.84 04/19/2019   Lab Results  Component Value Date   ESRSEDRATE 11 03/19/2015    Past Medical History:  Diagnosis Date  . Abscess    right posterior neck  . Bradycardia   . CVA (cerebral vascular accident) ([place])   . Elevated LFTs   . External hemorrhoids   . GERD (gastroesophageal reflux disease)   . HTN (hypertension)   . Ischemic optic neuropathy   . Other and unspecified hyperlipidemia   . Sinoatrial node dysfunction ([facility])   . Spinal stenosis     Past Surgical History:  Procedure Laterality Date  . ABSCESS DRAINAGE     righ tposterior neck  . CARDIAC CATHETERIZATION  1996  . CHOLECYSTECTOMY  02/07/2003  . COLONOSCOPY  06/23/2004  . EYE SURGERY     left eye  . left ingunial hernia  06/08/2011  . PACEMAKER INSERTION    . right hip replacement     2012 Dr. Maureen Ralphs     Medications:  Outpatient Encounter Medications as of 06/07/2019  Medication Sig  . benazepril (LOTENSIN) 5 MG tablet Take 1 tablet by mouth once daily  . cyclobenzaprine (FLEXERIL) 5 MG tablet Take 1 tablet (5 mg total) by  mouth 3 (three) times daily as needed for muscle spasms.  . diclofenac sodium (VOLTAREN) 1 % GEL Apply topically to affected area qid  . finasteride (PROSCAR) 5 MG tablet Take 1 tablet by mouth once daily  . fish oil-omega-3 fatty acids 1000 MG capsule Take 2 g by mouth daily. Reported on 02/18/2016  . gemfibrozil (LOPID) 600 MG tablet TAKE 1 TABLET BY MOUTH TWICE DAILY BEFORE A MEAL(S).  . indomethacin (INDOCIN) 25 MG capsule Take 25 mg by mouth 2 (two) times daily as needed (pain). Take with a  meal. 3 days maximun  . metoprolol succinate (TOPROL-XL) 25 MG 24 hr tablet Take 1 tablet (25 mg total) by mouth daily.  . Multiple Vitamin (MULTIVITAMIN) tablet Take 1 tablet by mouth daily.    Marland Kitchen omeprazole (PRILOSEC) 20 MG capsule Take 1 capsule (20 mg total) by mouth daily.  . rosuvastatin (CRESTOR) 20 MG tablet TAKE ONE TABLET BY MOUTH ONCE DAILY  . tadalafil (CIALIS) 5 MG tablet TAKE ONE TABLET BY MOUTH DAILY AS NEEDED FOR ERECTILE DYSFUNCTION  . tamsulosin (FLOMAX) 0.4 MG CAPS capsule Take 1 capsule by mouth once daily  . XARELTO 20 MG TABS tablet TAKE 1 TABLET BY MOUTH ONCE DAILY WITH  SUPPER  . zolpidem (AMBIEN) 5 MG tablet Take 1 tablet (5 mg total) by mouth at bedtime.   No facility-administered encounter medications on file as of 06/07/2019.     Allergies: No Known Allergies  Family History: Family History  Problem Relation Age of Onset  . Lung cancer Mother        mets to brain  . Heart attack Mother 50  . Hypertension Mother   . Brain cancer Mother   . Heart attack Father        mid 62s, smoker.   . Diabetes Father   . Diabetes Paternal Grandfather   . Healthy Brother     Social History: Social History   Tobacco Use  . Smoking status: Former Smoker    Packs/day: 1.00    Years: 20.00    Pack years: 20.00    Types: Cigarettes    Quit date: 11/04/1991    Years since quitting: 27.6  . Smokeless tobacco: Never Used  Substance Use Topics  . Alcohol use: Yes    Alcohol/week: 0.0 standard drinks    Comment: occ  . Drug use: No   Social History   Social History Narrative   Married. 1 child and 1 step child. 2 grandchildren.       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   EYES: No visual changes or eye pain ENT: No hearing changes.  No history of nose bleeds.   RESPIRATORY: No cough, wheezing and  shortness of breath.   CARDIOVASCULAR: Negative for chest pain, and palpitations.   GI: Negative for abdominal discomfort, blood in stools or black stools.  No recent change in bowel habits.   GU:  No history of incontinence.   MUSCLOSKELETAL: No history of joint pain or swelling.  No myalgias.   SKIN: Negative for lesions, rash, and itching.   HEMATOLOGY/ONCOLOGY: Negative for prolonged bleeding, bruising easily, and swollen nodes.  No history of cancer.   ENDOCRINE: Negative for cold or heat intolerance, polydipsia or goiter.   PSYCH:  No depression or  anxiety symptoms.   NEURO: As Above.   Vital Signs:  Ht '5\' 8"'  (1.727 m)   Wt 165 lb (74.8 kg)   BMI 25.09 kg/m    General Medical Exam:  Well appearing, comfortable.  Nonlabored breathing.  No deformity or edema.  No rash.  Neurological Exam: MENTAL STATUS including orientation to time, place, person, recent and remote memory, attention span and concentration, language, and fund of knowledge is normal.  Speech is not dysarthric.  CRANIAL NERVES:  Normal conjugate, extra-ocular eye movements in all directions of gaze.  No ptosis.  Normal facial symmetry and movements.  Normal shoulder shrug and head rotation.  Tongue is midline.  MOTOR:  Antigravity in all extremities.  No abnormal movements.  No pronator drift.   SENSORY:  Unable to assess   COORDINATION/GAIT: Normal finger to nose bilaterally.  Intact rapid alternating movements bilaterally.  Able to rise from a chair without using arms.  Gait narrow based and stable. Able to stand on heels and toes.   IMPRESSION: Bilateral feet paresthesias manifesting with burning pain over the soles of the feet.  Symptoms are suggestive of peripheral neuropathy. Unlikely S1 radiculopathy as he denies radicular pain.  I have requested that we set up in office visit for formal neuromuscular exam and electrodiagnostic testing.   I had discussed the pathogenesis, etiology, management, and natural  course of neuropathy. Neuropathy tends to be slowly progressive, especially if a treatable etiology is not identified.  I would like to test for treatable causes of neuropathy. I discussed that in the vast majority of cases, despite checking for reversible causes, we are unable to find the underlying etiology and management is symptomatic.  He had already been screened for diabetes, thyroid disease, and B12 deficiency.     PLAN/RECOMMENDATIONS:  Check NCS/EMG of the legs Check ESR, vitamin B1, folate, SPEP with IFE, copper Start OTC lidocaine ointment to feet twice daily as needed Consider gabapentin, only if pain interferes with quality of life.   Follow Up Instructions:  I discussed the assessment and treatment plan with the patient. The patient was provided an opportunity to ask questions and all were answered. The patient agreed with the plan and demonstrated an understanding of the instructions.   The patient was advised to call back or seek an in-person evaluation if the symptoms worsen or if the condition fails to improve as anticipated.  Further recommendations pending results.  Total Time spent:  45 min   Alda Berthold, DO

## 2019-06-07 ENCOUNTER — Other Ambulatory Visit: Payer: Self-pay

## 2019-06-07 ENCOUNTER — Encounter: Payer: Self-pay | Admitting: Neurology

## 2019-06-07 ENCOUNTER — Telehealth (INDEPENDENT_AMBULATORY_CARE_PROVIDER_SITE_OTHER): Payer: Medicare Other | Admitting: Neurology

## 2019-06-07 VITALS — Ht 68.0 in | Wt 165.0 lb

## 2019-06-07 DIAGNOSIS — Z9889 Other specified postprocedural states: Secondary | ICD-10-CM | POA: Insufficient documentation

## 2019-06-07 DIAGNOSIS — Z7901 Long term (current) use of anticoagulants: Secondary | ICD-10-CM

## 2019-06-07 DIAGNOSIS — Z45018 Encounter for adjustment and management of other part of cardiac pacemaker: Secondary | ICD-10-CM | POA: Insufficient documentation

## 2019-06-07 DIAGNOSIS — Z9289 Personal history of other medical treatment: Secondary | ICD-10-CM

## 2019-06-07 DIAGNOSIS — I498 Other specified cardiac arrhythmias: Secondary | ICD-10-CM | POA: Insufficient documentation

## 2019-06-07 DIAGNOSIS — R002 Palpitations: Secondary | ICD-10-CM | POA: Insufficient documentation

## 2019-06-07 DIAGNOSIS — I2510 Atherosclerotic heart disease of native coronary artery without angina pectoris: Secondary | ICD-10-CM | POA: Insufficient documentation

## 2019-06-07 DIAGNOSIS — R202 Paresthesia of skin: Secondary | ICD-10-CM

## 2019-06-07 DIAGNOSIS — H5111 Convergence insufficiency: Secondary | ICD-10-CM | POA: Insufficient documentation

## 2019-06-07 DIAGNOSIS — Z950 Presence of cardiac pacemaker: Secondary | ICD-10-CM | POA: Insufficient documentation

## 2019-06-07 HISTORY — DX: Encounter for adjustment and management of other part of cardiac pacemaker: Z45.018

## 2019-06-07 HISTORY — DX: Personal history of other medical treatment: Z92.89

## 2019-06-07 HISTORY — DX: Long term (current) use of anticoagulants: Z79.01

## 2019-06-07 NOTE — Progress Notes (Signed)
Orders placed.

## 2019-06-07 NOTE — Addendum Note (Signed)
Addended by: Amado Coe on: 06/07/2019 04:07 PM   Modules accepted: Orders

## 2019-06-18 ENCOUNTER — Other Ambulatory Visit: Payer: Self-pay | Admitting: Family Medicine

## 2019-06-18 DIAGNOSIS — N401 Enlarged prostate with lower urinary tract symptoms: Secondary | ICD-10-CM

## 2019-06-26 ENCOUNTER — Encounter: Payer: Self-pay | Admitting: Family Medicine

## 2019-06-26 ENCOUNTER — Other Ambulatory Visit: Payer: Self-pay

## 2019-06-26 DIAGNOSIS — I255 Ischemic cardiomyopathy: Secondary | ICD-10-CM

## 2019-06-26 MED ORDER — ROSUVASTATIN CALCIUM 40 MG PO TABS: 20.0000 mg | ORAL_TABLET | Freq: Every day | ORAL | 1 refills | Status: DC

## 2019-06-30 MED ORDER — ROSUVASTATIN CALCIUM 40 MG PO TABS: 40.0000 mg | ORAL_TABLET | Freq: Every day | ORAL | 3 refills | Status: DC

## 2019-06-30 NOTE — Telephone Encounter (Signed)
Rx has been changed. Pt notified of update. Contacted pharmacy regarding changed. Med list corrected. No further action needed!

## 2019-07-25 ENCOUNTER — Other Ambulatory Visit: Payer: Self-pay

## 2019-07-25 ENCOUNTER — Encounter: Payer: Self-pay | Admitting: Family Medicine

## 2019-07-25 ENCOUNTER — Ambulatory Visit: Payer: Medicare Other | Admitting: Family Medicine

## 2019-07-25 ENCOUNTER — Telehealth: Payer: Self-pay

## 2019-07-25 VITALS — BP 128/72 | HR 70 | Temp 98.4°F | Ht 68.0 in | Wt 170.2 lb

## 2019-07-25 DIAGNOSIS — R350 Frequency of micturition: Secondary | ICD-10-CM

## 2019-07-25 DIAGNOSIS — G4700 Insomnia, unspecified: Secondary | ICD-10-CM | POA: Diagnosis not present

## 2019-07-25 DIAGNOSIS — R1012 Left upper quadrant pain: Secondary | ICD-10-CM | POA: Diagnosis not present

## 2019-07-25 DIAGNOSIS — R208 Other disturbances of skin sensation: Secondary | ICD-10-CM

## 2019-07-25 LAB — POCT URINALYSIS DIPSTICK
Bilirubin, UA: NEGATIVE
Blood, UA: NEGATIVE
Glucose, UA: NEGATIVE
Ketones, UA: NEGATIVE
Leukocytes, UA: NEGATIVE
Nitrite, UA: NEGATIVE
Protein, UA: NEGATIVE
Spec Grav, UA: 1.015 (ref 1.010–1.025)
Urobilinogen, UA: 0.2 E.U./dL
pH, UA: 6 (ref 5.0–8.0)

## 2019-07-25 NOTE — Telephone Encounter (Signed)
Called patient to triage him and he states that he already spoke to someone and is scheduled to come in to see Dr. Yong Channel today at 4 pm.

## 2019-07-25 NOTE — Assessment & Plan Note (Signed)
S: Patient states he is very tired but simply does not sleep without Ambien.  With Ambien he is able to sleep 4 hours.  No driving issues or fall issues while taking Ambien.  Has tried over-the-counter sleep aids in the past as well as melatonin specifically with no benefit-over-the-counter aids did seem to make him jittery. A/P: Discussed possibility of trying gabapentin for both potential neuropathy as well as sleep issues- he wants to hold off for now and continue Ambien alone.

## 2019-07-25 NOTE — Patient Instructions (Addendum)
Health Maintenance Due  Topic Date Due  . TETANUS/TDAP - consider getting that at your pharmacy 10/04/2018  . INFLUENZA VACCINE - consider getting this in next month or two 07/01/2019   Call Dr. Serita Grit office to schedule labs  Let us know if new or worsening symptoms for pain in upper left side  Try to cut back perhaps 10-20 oz of fluids per day just to see if you note color to urine. If no improvement please let us know.

## 2019-07-25 NOTE — Progress Notes (Signed)
Phone [phone]   Subjective:  [name] is a 73 y.o. year old very pleasant male patient who presents for/with See problem oriented charting Chief Complaint  Patient presents with  . Acute Visit  . Urinary Frequency  . Abd Discomfort  . Burning Feet  . Fatigue   ROS-no fever/chills/nausea/vomiting.  He does have urinary frequency and some left upper quadrant pain  Past Medical History-  Patient Active Problem List   Diagnosis Date Noted  . S/P cardiac cath 06/07/2019    Priority: High  . CAD (coronary artery disease) 05/26/2016    Priority: High  . Nonischemic cardiomyopathy ([place]) 04/23/2016    Priority: High  . Chronic systolic heart failure ([place]) 12/01/2015    Priority: High  . CVA (cerebral infarction) 01/30/2015    Priority: High  . Atrial fibrillation and flutter ([place]) 01/01/2015    Priority: High  . Ventricular tachycardia, non-sustained ([place]) 01/25/2013    Priority: High  . Cardiac pacemaker in situ 12/28/2009    Priority: High  . Double vision 07/04/2018    Priority: Medium  . Hyperglycemia 06/10/2017    Priority: Medium  . Insomnia 01/30/2015    Priority: Medium  . BPH associated with nocturia 08/14/2014    Priority: Medium  . Gout 11/11/2009    Priority: Medium  . Hyperlipidemia 07/12/2007    Priority: Medium  . NEUROPATHY, ISCHEMIC OPTIC 06/02/2007    Priority: Medium  . Essential hypertension 06/02/2007    Priority: Medium  . Anticoagulation adequate 06/07/2019    Priority: Low  . Convergence insufficiency 06/07/2019    Priority: Low  . Fitting or adjustment of cardiac pacemaker 06/07/2019    Priority: Low  . Pacemaker-dependent due to native cardiac rhythm insufficient to support life 06/07/2019    Priority: Low  . Palpitations 06/07/2019    Priority: Low  . History of nuclear stress test 06/07/2019    Priority: Low  . Non-occlusive coronary artery disease 06/07/2019    Priority: Low  . Trigger finger, right middle finger  03/01/2018    Priority: Low  . Solitary pulmonary nodule 05/26/2016    Priority: Low  . Chest pressure 12/01/2015    Priority: Low  . Left chest pressure 03/19/2015    Priority: Low  . Erectile dysfunction 01/30/2015    Priority: Low  . Former smoker 01/30/2015    Priority: Low  . Allergic rhinitis 10/11/2014    Priority: Low  . Sinoatrial node dysfunction ([facility])     Priority: Low  . SPINAL STENOSIS, LUMBAR 08/22/2010    Priority: Low  . GERD 03/17/2010    Priority: Low    Medications- reviewed and updated Current Outpatient Medications  Medication Sig Dispense Refill  . benazepril (LOTENSIN) 5 MG tablet Take 1 tablet by mouth once daily 90 tablet 0  . cyclobenzaprine (FLEXERIL) 5 MG tablet Take 1 tablet (5 mg total) by mouth 3 (three) times daily as needed for muscle spasms. 30 tablet 2  . finasteride (PROSCAR) 5 MG tablet Take 1 tablet by mouth once daily 90 tablet 1  . fish oil-omega-3 fatty acids 1000 MG capsule Take 2 g by mouth daily. Reported on 02/18/2016    . gemfibrozil (LOPID) 600 MG tablet TAKE 1 TABLET BY MOUTH TWICE DAILY BEFORE A MEAL(S). 180 tablet 1  . indomethacin (INDOCIN) 25 MG capsule Take 25 mg by mouth 2 (two) times daily as needed (pain). Take with a meal. 3 days maximun    . metoprolol succinate (TOPROL-XL) 25 MG  24 hr tablet Take 1 tablet by mouth once daily 90 tablet 1  . Multiple Vitamin (MULTIVITAMIN) tablet Take 1 tablet by mouth daily.      Marland Kitchen omeprazole (PRILOSEC) 20 MG capsule Take 1 capsule (20 mg total) by mouth daily. 90 capsule 3  . rosuvastatin (CRESTOR) 40 MG tablet Take 1 tablet (40 mg total) by mouth daily. 90 tablet 3  . tadalafil (CIALIS) 5 MG tablet TAKE ONE TABLET BY MOUTH DAILY AS NEEDED FOR ERECTILE DYSFUNCTION 90 tablet 3  . tamsulosin (FLOMAX) 0.4 MG CAPS capsule Take 1 capsule by mouth once daily 90 capsule 1  . XARELTO 20 MG TABS tablet TAKE 1 TABLET BY MOUTH ONCE DAILY WITH  SUPPER 90 tablet 3  . zolpidem (AMBIEN) 5 MG tablet Take  1 tablet (5 mg total) by mouth at bedtime. 90 tablet 1   No current facility-administered medications for this visit.      Objective:  BP 128/72 (BP Location: Left Arm, Patient Position: Sitting, Cuff Size: Normal)   Pulse 70   Temp 98.4 F (36.9 C) (Oral)   Ht 5\' 8"  (1.727 m)   Wt 170 lb 3.2 oz (77.2 kg)   SpO2 96%   BMI 25.88 kg/m  Gen: NAD, resting comfortably CV: RRR no murmurs rubs or gallops Lungs: CTAB no crackles, wheeze, rhonchi Abdomen: soft/nontender/nondistended/normal bowel sounds. Ext: no edema Skin: warm, dry  Results for orders placed or performed in visit on 07/25/19 (from the past 24 hour(s))  POCT urinalysis dipstick     Status: None   Collection Time: 07/25/19  4:21 PM  Result Value Ref Range   Color, UA Yellow    Clarity, UA Clear    Glucose, UA Negative Negative   Bilirubin, UA Negative    Ketones, UA Negative    Spec Grav, UA 1.015 1.010 - 1.025   Blood, UA Negative    pH, UA 6.0 5.0 - 8.0   Protein, UA Negative Negative   Urobilinogen, UA 0.2 0.2 or 1.0 E.U./dL   Nitrite, UA Negative    Leukocytes, UA Negative Negative   Appearance     Odor         Assessment and Plan   # Urinary Frequency/clear urine S:C/o increased urinary frequency and lack of color.   Patient states at baseline he tends to urinate a lot but feels like this is increased over the last 2 to 3 days and he is going about 10 times a day-seems to be a reasonable quantity as well.  Also 3 days ago when he was going to the bathroom states his urine was incredibly clear- made him wonder if something was wrong-seem to be a sudden change and he had not recently increased his fluid intake. Wife has looked and does not appear yellow.   Spent several days with grandson who would climb on him ump on stomach- has noted some upper abdominal pain after that.  Patient wonders if he "bruised something" and this caused a change in the urine color A/P: Patient with baseline polyuria related to  BPH but increased in recent days-UA unremarkable.  He was also concerned about the very clear color- he did note and are smaller container he could see some color-lab also noted as yellow in color.  This was reassured patient.  We will also get urine culture to be on the safe side to make sure no infection.  Rectal exam today showed no clear prostatitis.  Patient is monogamous so doubt STD  either.  Does appear well-hydrated so we discussed could cut back on hydration status if he wants to see if color will turn further yellow.  If no improvement or persistent issues may follow-up with me for further evaluation and evaluation of polyuria if does not fact have large volume urination-we will have to do 24-hour collection if needed  # Burning sensation in feet/insomnia S: Patient was seen by Dr. Posey Pronto for suspected neuropathy.  Recommended EMG/nerve conduction study- he does not want to complete that at this time.  He believed he was going to be getting a call about this and never received 1 but he prefers not to have the test done anyway.  He also had labs ordered and I encouraged him to call to get the schedule.  He has tried some other therapies Dr. Posey Pronto suggested-Lidocaine helping shooting stabbing feeling coming out of toes. Still having some burning sensation under feet. For a year prior now reflects back and realizes he thought floor was very warm but it was actually burning in his feet.  A/P: Suspected neuropathy-suggested follow through with Dr. Posey Pronto for testing.  At a minimum encouraged him to call to schedule labs and then could consider nerve conduction study later if no obvious cause was found.  We discussed possibility of gabapentin since he also has sleep issues- he wants to hold off on this for now  .  # LUQ Discomfort S:C/o LUQ/rib discomfort x 3 months. Mild discomfort and mostly watching tv lying on couch. Worse in evening so after meals. He today is not sure how pleuritic it is-  previously reported as pleuritic. Denies fever, n/v/d, blood in stool, tar-like or coffee ground appearing stool. Sx worsen when supine. Discuss with Dr. Lovena Le in March 2020 and had CT Chest c/o CM done 04/21/2019 which showed the following:        IMPRESSION:        1. Stable exam. No new or progressive findings. No findings to        explain the patient's history of pleuritic left chest pain.        2. No change 5 mm left upper lobe pulmonary nodule, consistent with        benign etiology.        3. Coronary artery atherosclerosis. A/P: We reviewed his CT scan together and showed him that scan did pick up most of the area where he complains of pain- this provided some relief for patient's anxiety related to this.  Did discuss if symptoms persist or worsen could consider CT abdomen pelvis with contrast.  This could possibly be reflux and we discussed trial of omeprazole-worse in the evening when he is laying down on the couch.  Could also be related to constipation potentially- if worsening issues with unclear cause could trial MiraLAX  Recommended follow up: PRN for acute issue Future Appointments  Date Time Provider [place]  07/27/2019  3:15 PM Evans Lance, MD [id number]    Lab/Order associations:   ICD-10-CM   1. Urinary frequency  R35.0 POCT urinalysis dipstick    Urine Culture  2. Burning sensation of feet  R20.8   3. Insomnia, unspecified type  G47.00   4. Abdominal discomfort in left upper quadrant  R10.12     Time Stamp The duration of face-to-face time during this visit was greater than 40 minutes (4:25 PM to 5:06 PM). Greater than 50% of this time was spent in counseling, explanation of diagnosis, planning of  further management, and/or coordination of care including discussion of potential causes of neuropathy and need for further evaluation/completion of prior orders, discussing patient's concerns about urine but also reviewing some counter points,  discussing frustration of burning sensation of feet, discussing frustration of dependence feeling on Ambien-did discuss other options such as trazodone which he declines for now.    Return precautions advised.  Garret Reddish, MD

## 2019-07-27 ENCOUNTER — Ambulatory Visit (INDEPENDENT_AMBULATORY_CARE_PROVIDER_SITE_OTHER): Payer: Medicare Other | Admitting: Internal Medicine

## 2019-07-27 ENCOUNTER — Other Ambulatory Visit: Payer: Self-pay

## 2019-07-27 ENCOUNTER — Encounter: Payer: Self-pay | Admitting: Internal Medicine

## 2019-07-27 VITALS — BP 108/82 | HR 110 | Ht 68.0 in | Wt 170.2 lb

## 2019-07-27 DIAGNOSIS — I495 Sick sinus syndrome: Secondary | ICD-10-CM | POA: Diagnosis not present

## 2019-07-27 DIAGNOSIS — I480 Paroxysmal atrial fibrillation: Secondary | ICD-10-CM | POA: Diagnosis not present

## 2019-07-27 DIAGNOSIS — Z950 Presence of cardiac pacemaker: Secondary | ICD-10-CM | POA: Diagnosis not present

## 2019-07-27 DIAGNOSIS — I2510 Atherosclerotic heart disease of native coronary artery without angina pectoris: Secondary | ICD-10-CM

## 2019-07-27 LAB — URINE CULTURE
MICRO NUMBER:: 808996
Result:: NO GROWTH
SPECIMEN QUALITY:: ADEQUATE

## 2019-07-27 NOTE — Progress Notes (Signed)
HPI [name] returns today for followup. He is a pleasant 73 yo man with a h/o sinus node dysfunction, HTN, CAD, dyslipidemia, s/p PPM insertion. He has gone back to swimming 3 days a week and feels well. He has not had any chest pain.  No Known Allergies   Current Outpatient Medications  Medication Sig Dispense Refill  . benazepril (LOTENSIN) 5 MG tablet Take 1 tablet by mouth once daily 90 tablet 0  . cyclobenzaprine (FLEXERIL) 5 MG tablet Take 1 tablet (5 mg total) by mouth 3 (three) times daily as needed for muscle spasms. 30 tablet 2  . finasteride (PROSCAR) 5 MG tablet Take 1 tablet by mouth once daily 90 tablet 1  . fish oil-omega-3 fatty acids 1000 MG capsule Take 2 g by mouth daily. Reported on 02/18/2016    . gemfibrozil (LOPID) 600 MG tablet TAKE 1 TABLET BY MOUTH TWICE DAILY BEFORE A MEAL(S). 180 tablet 1  . indomethacin (INDOCIN) 25 MG capsule Take 25 mg by mouth 2 (two) times daily as needed (pain). Take with a meal. 3 days maximun    . metoprolol succinate (TOPROL-XL) 25 MG 24 hr tablet Take 1 tablet by mouth once daily 90 tablet 1  . Multiple Vitamin (MULTIVITAMIN) tablet Take 1 tablet by mouth daily.      Marland Kitchen omeprazole (PRILOSEC) 20 MG capsule Take 1 capsule (20 mg total) by mouth daily. 90 capsule 3  . rosuvastatin (CRESTOR) 40 MG tablet Take 1 tablet (40 mg total) by mouth daily. 90 tablet 3  . tadalafil (CIALIS) 5 MG tablet TAKE ONE TABLET BY MOUTH DAILY AS NEEDED FOR ERECTILE DYSFUNCTION 90 tablet 3  . tamsulosin (FLOMAX) 0.4 MG CAPS capsule Take 1 capsule by mouth once daily 90 capsule 1  . XARELTO 20 MG TABS tablet TAKE 1 TABLET BY MOUTH ONCE DAILY WITH  SUPPER 90 tablet 3  . zolpidem (AMBIEN) 5 MG tablet Take 1 tablet (5 mg total) by mouth at bedtime. 90 tablet 1   No current facility-administered medications for this visit.      Past Medical History:  Diagnosis Date  . Abscess    right posterior neck  . Bradycardia   . CVA (cerebral vascular  accident) (Normal)   . Elevated LFTs   . External hemorrhoids   . GERD (gastroesophageal reflux disease)   . HTN (hypertension)   . Ischemic optic neuropathy   . Other and unspecified hyperlipidemia   . Sinoatrial node dysfunction (HCC)   . Spinal stenosis     ROS:   All systems reviewed and negative except as noted in the HPI.   Past Surgical History:  Procedure Laterality Date  . ABSCESS DRAINAGE     righ tposterior neck  . CARDIAC CATHETERIZATION  1996  . CHOLECYSTECTOMY  02/07/2003  . COLONOSCOPY  06/23/2004  . EYE SURGERY     left eye  . left ingunial hernia  06/08/2011  . PACEMAKER INSERTION    . right hip replacement     2012 Dr. Maureen Ralphs     Family History  Problem Relation Age of Onset  . Lung cancer Mother        mets to brain  . Heart attack Mother 59  . Hypertension Mother   . Brain cancer Mother   . Heart attack Father        mid 43s, smoker.   . Diabetes Father   . Diabetes Paternal Grandfather   . Healthy Brother  Social History   Socioeconomic History  . Marital status: Married    Spouse name: Not on file  . Number of children: 1  . Years of education: 87  . Highest education level: Not on file  Occupational History  . Not on file  Social Needs  . Financial resource strain: Not on file  . Food insecurity    Worry: Not on file    Inability: Not on file  . Transportation needs    Medical: Not on file    Non-medical: Not on file  Tobacco Use  . Smoking status: Former Smoker    Packs/day: 1.00    Years: 20.00    Pack years: 20.00    Types: Cigarettes    Quit date: 11/04/1991    Years since quitting: 27.7  . Smokeless tobacco: Never Used  Substance and Sexual Activity  . Alcohol use: Yes    Alcohol/week: 0.0 standard drinks    Comment: occ  . Drug use: No  . Sexual activity: Not on file  Lifestyle  . Physical activity    Days per week: Not on file    Minutes per session: Not on file  . Stress: Not on file  Relationships  .  Social Herbalist on phone: Not on file    Gets together: Not on file    Attends religious service: Not on file    Active member of club or organization: Not on file    Attends meetings of clubs or organizations: Not on file    Relationship status: Not on file  . Intimate partner violence    Fear of current or ex partner: Not on file    Emotionally abused: Not on file    Physically abused: Not on file    Forced sexual activity: Not on file  Other Topics Concern  . Not on file  Social History Narrative   Married. 1 child and 1 step child. 2 grandchildren.       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      Hobbies: swimming (states not fun), travel      Lives in a single story home     BP 108/82   Pulse (!) 110   Ht 5\' 8"  (1.727 m)   Wt 170 lb 3.2 oz (77.2 kg)   SpO2 95%   BMI 25.88 kg/m   Physical Exam:  Well appearing NAD HEENT: Unremarkable Neck:  No JVD, no thyromegally Lymphatics:  No adenopathy Back:  No CVA tenderness Lungs:  Clear with no wheezes HEART:  Regular rate rhythm, no murmurs, no rubs, no clicks Abd:  soft, positive bowel sounds, no organomegally, no rebound, no guarding Ext:  2 plus pulses, no edema, no cyanosis, no clubbing Skin:  No rashes no nodules Neuro:  CN II through XII intact, motor grossly intact  EKG - nsr with ventricular pacing  DEVICE  Normal device function.  See PaceArt for details.   Assess/Plan: 1. Sinus node dysfunction - he is asymptomatic, s/p PPM insertion. 2. PPM - his Biotronik DDD PM is working normally. We will recheck in several months. 3. PAF - he is maintaining NSR. He will continue eliquis. 4. Dyslipidemia - he will continue his statin therapy.  [name].D.

## 2019-07-27 NOTE — Patient Instructions (Signed)
Medication Instructions:  Your physician recommends that you continue on your current medications as directed. Please refer to the Current Medication list given to you today.  Labwork: None ordered.  Testing/Procedures: None ordered.  Follow-Up: Your physician wants you to follow-up in: one year with Dr. Lovena Le.   You will receive a reminder letter in the mail two months in advance. If you don't receive a letter, please call our office to schedule the follow-up appointment.  Call our device clinic when you receive your remote monitor to get set up. Device clinic [phone]   Any Other Special Instructions Will Be Listed Below (If Applicable).  If you need a refill on your cardiac medications before your next appointment, please call your pharmacy.

## 2019-08-23 ENCOUNTER — Other Ambulatory Visit (INDEPENDENT_AMBULATORY_CARE_PROVIDER_SITE_OTHER): Payer: Medicare Other

## 2019-08-23 ENCOUNTER — Other Ambulatory Visit: Payer: Self-pay

## 2019-08-23 DIAGNOSIS — R202 Paresthesia of skin: Secondary | ICD-10-CM

## 2019-08-23 LAB — SEDIMENTATION RATE: Sed Rate: 3 mm/hr (ref 0–20)

## 2019-08-23 LAB — FOLATE: Folate: >24.1 ng/mL (ref >5.9)

## 2019-08-23 NOTE — Addendum Note (Signed)
Addended by: [name] T on: 08/23/2019 09:11 AM   Modules accepted: Orders

## 2019-08-23 NOTE — Addendum Note (Signed)
Addended by: [name] T on: 08/23/2019 09:05 AM   Modules accepted: Orders

## 2019-08-28 ENCOUNTER — Telehealth: Payer: Self-pay

## 2019-08-28 LAB — IMMUNOFIXATION ELECTROPHORESIS
IgG (Immunoglobin G), Serum: 863 mg/dL (ref 600–1540)
IgM, Serum: 74 mg/dL (ref 50–300)
Immunofix Electr Int: NOT DETECTED
Immunoglobulin A: 242 mg/dL (ref 70–320)

## 2019-08-28 LAB — PROTEIN ELECTROPHORESIS, SERUM
Albumin ELP: 4.3 g/dL (ref 3.8–4.8)
Alpha 1: 0.3 g/dL (ref 0.2–0.3)
Alpha 2: 0.6 g/dL (ref 0.5–0.9)
Beta 2: 0.4 g/dL (ref 0.2–0.5)
Beta Globulin: 0.6 g/dL (ref 0.4–0.6)
Gamma Globulin: 0.8 g/dL (ref 0.8–1.7)
Total Protein: 7 g/dL (ref 6.1–8.1)

## 2019-08-28 LAB — CERULOPLASMIN: Ceruloplasmin: 26 mg/dL (ref 18–36)

## 2019-08-28 LAB — EXTRA SPECIMEN

## 2019-08-28 LAB — VITAMIN B1: Vitamin B1 (Thiamine): 19 nmol/L (ref 8–30)

## 2019-08-28 NOTE — Telephone Encounter (Signed)
-----   Message from Alda Berthold, DO sent at 08/28/2019 10:32 AM EDT ----- Please notify patient lab are within normal limits.  At his virtual visit, I discussed getting nerve testing of the legs to evaluate his symptoms in the feet.  If he is interested, please schedule. Thank you.

## 2019-08-28 NOTE — Telephone Encounter (Signed)
Patient choses not to do nerve testing at this time. Notified patient of labs results

## 2019-09-04 ENCOUNTER — Other Ambulatory Visit: Payer: Self-pay | Admitting: Internal Medicine

## 2019-09-04 ENCOUNTER — Other Ambulatory Visit: Payer: Self-pay | Admitting: Family Medicine

## 2019-09-04 DIAGNOSIS — I4891 Unspecified atrial fibrillation: Secondary | ICD-10-CM

## 2019-09-04 DIAGNOSIS — I4892 Unspecified atrial flutter: Secondary | ICD-10-CM

## 2019-09-05 NOTE — Telephone Encounter (Signed)
31m 77.2kg Scr 1.04 04/18/09  ccr 70.1 Lovw/taylor 07/27/19

## 2019-09-12 ENCOUNTER — Telehealth: Payer: Self-pay | Admitting: Family Medicine

## 2019-09-12 NOTE — Telephone Encounter (Signed)
I called the patient to schedule AWV with Loma Sousa.  He said that he will be away for awhile and requested that I call him back mid-November. VDM (Dee-Dee)

## 2019-09-14 ENCOUNTER — Other Ambulatory Visit: Payer: Self-pay

## 2019-09-14 ENCOUNTER — Telehealth: Payer: Medicare Other | Admitting: Physician Assistant

## 2019-09-14 DIAGNOSIS — M791 Myalgia, unspecified site: Secondary | ICD-10-CM

## 2019-09-14 DIAGNOSIS — Z20822 Contact with and (suspected) exposure to covid-19: Secondary | ICD-10-CM

## 2019-09-14 DIAGNOSIS — R053 Chronic cough: Secondary | ICD-10-CM

## 2019-09-14 DIAGNOSIS — R05 Cough: Secondary | ICD-10-CM

## 2019-09-14 NOTE — Progress Notes (Signed)
Based on what you shared with me, I feel your condition warrants further evaluation and I recommend that you be seen for a face to face office visit. Giving long-standing cough and other significant but intermittent symptoms, you need to contact your primary care provider for a video visit to discuss symptoms in detail to determine best treatment. If symptoms are significant, recommend assessment at nearest Urgent Care.   NOTE: If you entered your credit card information for this eVisit, you will not be charged. You may see a "hold" on your card for the $35 but that hold will drop off and you will not have a charge processed.  If you are having a true medical emergency please call 911.     For an urgent face to face visit, [facility] has four urgent care centers for your convenience:   . [facility]    [phone]                  Get Driving Directions  [id number] [place], [place] [address] . 10 am to 8 pm Monday-Friday . 12 pm to 8 pm Saturday-Sunday   . [facility] at [place]                  Get Driving Directions  [id number] [place], [place], [place] [address] . 8 am to 8 pm Monday-Friday . 9 am to 6 pm Saturday . 11 am to 6 pm Sunday   . [facility] at [place]                  Get Driving Directions   [address] [place], [place] [address] . 8 am to 8 pm Monday-Friday . 8 am to 4 pm Saturday-Sunday    . [facility] at [place]                    Get Driving Directions  [id number]  [address] [place], [place] [address]  . Monday-Friday, 12 PM to 6 PM    Your e-visit answers were reviewed by a board certified advanced clinical practitioner to complete your personal care plan.  Thank you for using e-Visits.

## 2019-09-15 LAB — NOVEL CORONAVIRUS, NAA: SARS-CoV-2, NAA: NOT DETECTED

## 2019-09-18 ENCOUNTER — Other Ambulatory Visit (HOSPITAL_COMMUNITY)
Admission: RE | Admit: 2019-09-18 | Discharge: 2019-09-18 | Disposition: A | Discharge status: Home / Self Care | Payer: Medicare Other | Source: Ambulatory Visit | Attending: Family Medicine | Admitting: Family Medicine

## 2019-09-18 ENCOUNTER — Encounter: Payer: Self-pay | Admitting: Family Medicine

## 2019-09-18 ENCOUNTER — Other Ambulatory Visit: Payer: Self-pay

## 2019-09-18 ENCOUNTER — Ambulatory Visit (INDEPENDENT_AMBULATORY_CARE_PROVIDER_SITE_OTHER): Payer: Medicare Other | Admitting: Family Medicine

## 2019-09-18 VITALS — BP 134/72 | HR 69 | Temp 97.7°F | Wt 172.2 lb

## 2019-09-18 DIAGNOSIS — Z20822 Contact with and (suspected) exposure to covid-19: Secondary | ICD-10-CM

## 2019-09-18 DIAGNOSIS — R300 Dysuria: Secondary | ICD-10-CM | POA: Diagnosis not present

## 2019-09-18 LAB — POCT URINALYSIS DIPSTICK
Bilirubin, UA: NEGATIVE
Blood, UA: NEGATIVE
Glucose, UA: NEGATIVE
Ketones, UA: NEGATIVE
Leukocytes, UA: NEGATIVE
Nitrite, UA: NEGATIVE
Protein, UA: NEGATIVE
Spec Grav, UA: >=1.03 — AB (ref 1.010–1.025)
Urobilinogen, UA: 0.2 E.U./dL
pH, UA: 5.5 (ref 5.0–8.0)

## 2019-09-18 MED ORDER — AZITHROMYCIN 500 MG PO TABS: 1000.0000 mg | ORAL_TABLET | Freq: Once | ORAL | 0 refills | Status: AC

## 2019-09-18 MED ORDER — CEPHALEXIN 500 MG PO CAPS: 500.0000 mg | ORAL_CAPSULE | Freq: Two times a day (BID) | ORAL | 0 refills | Status: AC

## 2019-09-18 MED ORDER — DOXYCYCLINE HYCLATE 100 MG PO TABS: 100.0000 mg | ORAL_TABLET | Freq: Two times a day (BID) | ORAL | 0 refills | Status: DC

## 2019-09-18 NOTE — Telephone Encounter (Signed)
Contacted patient to schedule appointment. Left message with family member to just "schedule appointment." Did not give family member reason for visit. Awaiting patient's call back to schedule with another [facility] provider per Dr. Yong Channel. No need to route note.

## 2019-09-18 NOTE — Progress Notes (Signed)
   Chief Complaint:  [name] is a 73 y.o. male who presents for same day appointment with a chief complaint of dysuria.   Assessment/Plan:  Dysuria History concerning for urethritis however has reportedly been in a monogamous relationship with his wife.  UA today with no abnormalities.  Will check urine culture and GC/chlamydia.  Will start doxycycline due to concern for urethritis.  Encouraged good oral hydration.  Discussed reasons to return to care.     Subjective:  HPI:  Dysuria, acute problem Started about a week ago. Within the last couple of days has noticed a penile discharge.  Symptoms seem to be worsening. No fevers. Pain is constant, but worse with urination. Has been sexually active with his wife within the last week. Just changed to a new lubricant because the previous was causing burning. No treatments tried. No other obvious aggravating or alleviating factors.   ROS: Per HPI  PMH: He reports that he quit smoking about 27 years ago. His smoking use included cigarettes. He has a 20.00 pack-year smoking history. He has never used smokeless tobacco. He reports current alcohol use. He reports that he does not use drugs.      Objective:  Physical Exam: BP 134/72   Pulse 69   Temp 97.7 F (36.5 C)   Wt 172 lb 4 oz (78.1 kg)   SpO2 97%   BMI 26.19 kg/m   Gen: NAD, resting comfortably CV: Regular rate and rhythm with no murmurs appreciated Pulm: Normal work of breathing, clear to auscultation bilaterally with no crackles, wheezes, or rhonchi GI: Normal bowel sounds present. Soft, Nontender, Nondistended. MSK: No edema, cyanosis, or clubbing noted Skin: Warm, dry Neuro: Grossly normal, moves all extremities Psych: Normal affect and thought content  Results for orders placed or performed in visit on 09/18/19 (from the past 24 hour(s))  POCT urinalysis dipstick     Status: Abnormal   Collection Time: 09/18/19 11:53 AM  Result Value Ref Range   Color, UA  Yellow    Clarity, UA Cloudy    Glucose, UA Negative Negative   Bilirubin, UA Negative    Ketones, UA Negative    Spec Grav, UA >=1.030 (A) 1.010 - 1.025   Blood, UA Negative    pH, UA 5.5 5.0 - 8.0   Protein, UA Negative Negative   Urobilinogen, UA 0.2 0.2 or 1.0 E.U./dL   Nitrite, UA Negative    Leukocytes, UA Negative Negative   Appearance     Odor Yes          M. Jerline Pain, MD 09/18/2019 12:09 PM

## 2019-09-18 NOTE — Telephone Encounter (Signed)
Send in rx for kelfex instead. Also sent in one time dose of azithromycin - do not think he needs this now, but should still fill and hold on to until we get his urine sample results back.  Algis Greenhouse. Jerline Pain, MD 09/18/2019 4:08 PM

## 2019-09-18 NOTE — Telephone Encounter (Signed)
See note  Copied from [place] [phone]. Topic: General - Other >> Sep 18, 2019  2:16 PM Rainey Pines A wrote: Patient would like a callback in regards to nurse for a different antibiotic that doesn't affect being in the sun for long periods of time. Patient is going on vacation.

## 2019-09-18 NOTE — Patient Instructions (Signed)
It was very nice to see you today!  Start the doxycycline.  We will check a few different test to see what specific bacteria are yeast is causing your symptoms.  Please make sure that you get plenty of fluids.  Take care, Dr Jerline Pain

## 2019-09-20 LAB — URINE CULTURE
MICRO NUMBER:: 1004160
SPECIMEN QUALITY:: ADEQUATE

## 2019-09-20 LAB — NOVEL CORONAVIRUS, NAA: SARS-CoV-2, NAA: NOT DETECTED

## 2019-09-21 LAB — URINE CYTOLOGY ANCILLARY ONLY
Candida Urine: NEGATIVE
Chlamydia: NEGATIVE
Comment: NEGATIVE
Comment: NEGATIVE
Comment: NORMAL
Neisseria Gonorrhea: NEGATIVE
Trichomonas: NEGATIVE

## 2019-09-21 NOTE — Progress Notes (Signed)
Please inform patient of the following:  Urine culture confirms that he has a bacterial infection. This should be treated with the keflex. Would like for him to let us know if still having symptoms.  [name]. Jerline Pain, MD 09/21/2019 10:07 AM

## 2019-09-22 ENCOUNTER — Encounter: Payer: Self-pay | Admitting: Family Medicine

## 2019-09-22 NOTE — Telephone Encounter (Signed)
Please advise 

## 2019-10-04 ENCOUNTER — Encounter: Payer: Self-pay | Admitting: Family Medicine

## 2019-10-05 ENCOUNTER — Other Ambulatory Visit: Payer: Self-pay

## 2019-10-05 MED ORDER — INDOMETHACIN 25 MG PO CAPS: 25.0000 mg | ORAL_CAPSULE | Freq: Two times a day (BID) | ORAL | 0 refills | Status: DC | PRN

## 2019-10-12 ENCOUNTER — Telehealth: Payer: Self-pay

## 2019-10-12 NOTE — Telephone Encounter (Signed)
I spoke with the pt to let him know his ppm can not be followed remotely because it do not have the chip in it that allows [place] to monitor it. The pt verbalized understanding and thanked me for the call.

## 2019-10-13 ENCOUNTER — Other Ambulatory Visit: Payer: Self-pay

## 2019-10-13 DIAGNOSIS — Z20822 Contact with and (suspected) exposure to covid-19: Secondary | ICD-10-CM

## 2019-10-16 LAB — NOVEL CORONAVIRUS, NAA: SARS-CoV-2, NAA: NOT DETECTED

## 2019-11-15 ENCOUNTER — Ambulatory Visit: Payer: Self-pay | Admitting: Family Medicine

## 2019-11-15 NOTE — Telephone Encounter (Signed)
Pt reports abdominal "Shooting pains right below my ribcage center left, upper abdomen." States saw Dr. Yong Channel regarding similar pain 2-3 months ago, work up "Didn't show anything." States pain at that time was dull, now "Shooting." Onset 3 days ago. States varies in frequency. Denies any nausea, no vomiting, no diarrhea. Has not taken any OTC meds.  Pt reluctant to continue triage.  "That's what the doctor is for."  Call transferred to practice, [facility], for consideration of appt within 24 hrs. Pt requesting appt today with Dr. Yong Channel, states will see another provide if necessary.  Pt advised ED if symptoms worsen. Pt verbalizes understanding. Reason for Disposition . [1] MODERATE pain (e.g., interferes with normal activities) AND [2] pain comes and goes (cramps) AND [3] present > 24 hours  (Exception: pain with Vomiting or Diarrhea - see that Guideline)  Answer Assessment - Initial Assessment Questions 1. LOCATION: "Where does it hurt?"      Under ribcage towards left side 2. RADIATION: "Does the pain shoot anywhere else?" (e.g., chest, back)     no 3. ONSET: "When did the pain begin?" (Minutes, hours or days ago)      Had dull for 2-3 months, worsening past 3 days 4. SUDDEN: "Gradual or sudden onset?"     Gradual 5. PATTERN "Does the pain come and go, or is it constant?"    - If constant: "Is it getting better, staying the same, or worsening?"      (Note: Constant means the pain never goes away completely; most serious pain is constant and it progresses)     - If intermittent: "How long does it last?" "Do you have pain now?"     (Note: Intermittent means the pain goes away completely between bouts)     Intermittent 6. SEVERITY: "How bad is the pain?"  (e.g., Scale 1-10; mild, moderate, or severe)    - MILD (1-3): doesn't interfere with normal activities, abdomen soft and not tender to touch     - MODERATE (4-7): interferes with normal activities or awakens from sleep, tender to touch   - SEVERE (8-10): excruciating pain, doubled over, unable to do any normal activities       Shooting 7. RECURRENT SYMPTOM: "Have you ever had this type of abdominal pain before?" If so, ask: "When was the last time?" and "What happened that time?"      Dull before 8. CAUSE: "What do you think is causing the abdominal pain?"     unsure 9. RELIEVING/AGGRAVATING FACTORS: "What makes it better or worse?" (e.g., movement, antacids, bowel movement)     Nothing 10. OTHER SYMPTOMS: "Has there been any vomiting, diarrhea, constipation, or urine problems?" no  Protocols used: ABDOMINAL PAIN - MALE-A-AH

## 2019-11-15 NOTE — Telephone Encounter (Signed)
See note

## 2019-11-15 NOTE — Telephone Encounter (Signed)
FYI: Pt scheduled for tomorrow.

## 2019-11-16 ENCOUNTER — Other Ambulatory Visit: Payer: Self-pay

## 2019-11-16 ENCOUNTER — Encounter: Payer: Self-pay | Admitting: Family Medicine

## 2019-11-16 ENCOUNTER — Ambulatory Visit (INDEPENDENT_AMBULATORY_CARE_PROVIDER_SITE_OTHER): Payer: Medicare Other | Admitting: Family Medicine

## 2019-11-16 VITALS — BP 124/68 | HR 76 | Temp 98.4°F | Ht 68.0 in | Wt 172.2 lb

## 2019-11-16 DIAGNOSIS — K219 Gastro-esophageal reflux disease without esophagitis: Secondary | ICD-10-CM

## 2019-11-16 DIAGNOSIS — I428 Other cardiomyopathies: Secondary | ICD-10-CM | POA: Diagnosis not present

## 2019-11-16 DIAGNOSIS — E785 Hyperlipidemia, unspecified: Secondary | ICD-10-CM | POA: Diagnosis not present

## 2019-11-16 DIAGNOSIS — Z20822 Contact with and (suspected) exposure to covid-19: Secondary | ICD-10-CM

## 2019-11-16 DIAGNOSIS — R1012 Left upper quadrant pain: Secondary | ICD-10-CM | POA: Diagnosis not present

## 2019-11-16 DIAGNOSIS — Z20828 Contact with and (suspected) exposure to other viral communicable diseases: Secondary | ICD-10-CM

## 2019-11-16 NOTE — Patient Instructions (Addendum)
Health Maintenance Due  Topic Date Due  . Samul Dada will get at pharmacy  10/04/2018   Please stop by lab before you go If you do not have mychart- we will call you about results within 5 business days of [place] receiving them.  If you have mychart- we will send your results within 3 business days of [place] receiving them.  If abnormal or we want to clarify a result, we will call or mychart you to make sure you receive the message.  If you have questions or concerns or don't hear within 5-7 days, please send [place] a message or call us.   We will call you within two weeks about your referral for CT scan. If you do not hear within 3 weeks, give [place] a call.   Team- please him contrast for [place] CT

## 2019-11-16 NOTE — Telephone Encounter (Signed)
Noted thanks °

## 2019-11-16 NOTE — Progress Notes (Signed)
Phone [phone] In person visit   Subjective:   [name] is a 73 y.o. year old very pleasant male patient who presents for/with See problem oriented charting Chief Complaint  Patient presents with  . Abdominal Pain    ROS- Review of Systems  Constitutional: Negative.  Negative for fever.  HENT: Positive for hearing loss.        History of hearing loss no changes   Eyes: Negative.   Respiratory: Negative.   Cardiovascular: Positive for palpitations.       Followed by cardiology no new symptoms.   Gastrointestinal: Positive for abdominal pain and heartburn. Negative for constipation, diarrhea, flatus, nausea and vomiting.       Controlled with medication.   Genitourinary: Negative for dysuria, frequency and hematuria.       Did have UTI in October but treated and better now.   Musculoskeletal: Negative.   Skin: Negative.   Neurological: Negative.   Endo/Heme/Allergies: Negative.   Psychiatric/Behavioral: Negative.   no chest pain or shortness of breath reported    This visit occurred during the SARS-CoV-2 public health emergency.  Safety protocols were in place, including screening questions prior to the visit, additional usage of staff PPE, and extensive cleaning of exam room while observing appropriate contact time as indicated for disinfecting solutions.   Past Medical History-  Patient Active Problem List   Diagnosis Date Noted  . S/P cardiac cath 06/07/2019    Priority: High  . CAD (coronary artery disease) 05/26/2016    Priority: High  . Nonischemic cardiomyopathy ([place]) 04/23/2016    Priority: High  . Chronic systolic heart failure ([place]) 12/01/2015    Priority: High  . CVA (cerebral infarction) 01/30/2015    Priority: High  . Atrial fibrillation and flutter ([place]) 01/01/2015    Priority: High  . Ventricular tachycardia, non-sustained ([place]) 01/25/2013    Priority: High  . Cardiac pacemaker in situ 12/28/2009    Priority: High  . Double vision  07/04/2018    Priority: Medium  . Hyperglycemia 06/10/2017    Priority: Medium  . Insomnia 01/30/2015    Priority: Medium  . BPH associated with nocturia 08/14/2014    Priority: Medium  . Gout 11/11/2009    Priority: Medium  . Hyperlipidemia 07/12/2007    Priority: Medium  . NEUROPATHY, ISCHEMIC OPTIC 06/02/2007    Priority: Medium  . Essential hypertension 06/02/2007    Priority: Medium  . Anticoagulation adequate 06/07/2019    Priority: Low  . Convergence insufficiency 06/07/2019    Priority: Low  . Fitting or adjustment of cardiac pacemaker 06/07/2019    Priority: Low  . Pacemaker-dependent due to native cardiac rhythm insufficient to support life 06/07/2019    Priority: Low  . Palpitations 06/07/2019    Priority: Low  . History of nuclear stress test 06/07/2019    Priority: Low  . Non-occlusive coronary artery disease 06/07/2019    Priority: Low  . Trigger finger, right middle finger 03/01/2018    Priority: Low  . Solitary pulmonary nodule 05/26/2016    Priority: Low  . Chest pressure 12/01/2015    Priority: Low  . Left chest pressure 03/19/2015    Priority: Low  . Erectile dysfunction 01/30/2015    Priority: Low  . Former smoker 01/30/2015    Priority: Low  . Allergic rhinitis 10/11/2014    Priority: Low  . Sinoatrial node dysfunction (HCC)     Priority: Low  . SPINAL STENOSIS, LUMBAR 08/22/2010    Priority: Low  .  GERD 03/17/2010    Priority: Low  . Paroxysmal atrial fibrillation ([place]) 07/27/2019    Medications- reviewed and updated Current Outpatient Medications  Medication Sig Dispense Refill  . benazepril (LOTENSIN) 5 MG tablet Take 1 tablet by mouth once daily 90 tablet 0  . cyclobenzaprine (FLEXERIL) 5 MG tablet Take 1 tablet (5 mg total) by mouth 3 (three) times daily as needed for muscle spasms. 30 tablet 2  . finasteride (PROSCAR) 5 MG tablet Take 1 tablet by mouth once daily 90 tablet 1  . fish oil-omega-3 fatty acids 1000 MG capsule Take 2 g  by mouth daily. Reported on 02/18/2016    . gemfibrozil (LOPID) 600 MG tablet TAKE 1 TABLET BY MOUTH TWICE DAILY BEFORE A MEAL 180 tablet 2  . indomethacin (INDOCIN) 25 MG capsule Take 1 capsule (25 mg total) by mouth 2 (two) times daily as needed (pain). Take with a meal. 3 days maximun 30 capsule 0  . metoprolol succinate (TOPROL-XL) 25 MG 24 hr tablet Take 1 tablet by mouth once daily 90 tablet 1  . Multiple Vitamin (MULTIVITAMIN) tablet Take 1 tablet by mouth daily.      Marland Kitchen omeprazole (PRILOSEC) 20 MG capsule Take 1 capsule (20 mg total) by mouth daily. 90 capsule 3  . rosuvastatin (CRESTOR) 40 MG tablet Take 1 tablet (40 mg total) by mouth daily. 90 tablet 3  . tadalafil (CIALIS) 5 MG tablet TAKE ONE TABLET BY MOUTH DAILY AS NEEDED FOR ERECTILE DYSFUNCTION 90 tablet 3  . XARELTO 20 MG TABS tablet TAKE 1 TABLET BY MOUTH ONCE DAILY WITH SUPPER 90 tablet 0  . zolpidem (AMBIEN) 5 MG tablet Take 1 tablet (5 mg total) by mouth at bedtime. 90 tablet 1  . tamsulosin (FLOMAX) 0.4 MG CAPS capsule Take 1 capsule by mouth once daily (Patient not taking: Reported on 11/16/2019) 90 capsule 1   No current facility-administered medications for this visit.     Objective:  BP 124/68   Pulse 76   Temp 98.4 F (36.9 C) (Temporal)   Ht 5\' 8"  (1.727 m)   Wt 172 lb 3.2 oz (78.1 kg)   SpO2 97%   BMI 26.18 kg/m  Gen: NAD, resting comfortably CV: RRR no murmurs rubs or gallops Lungs: CTAB no crackles, wheeze, rhonchi Abdomen: soft/nontender in LUQ but did have moderate tenderness with deep palpation in LLQ in pinpoint area/nondistended/normal bowel sounds. No rebound or guarding.  Ext: no edema Skin: warm, dry Neuro: grossly normal, moves all extremities     Assessment and Plan  # Abdominal pain  S:Today patient reports,Patient state still has dull ache in LUQ that has been going on for perhaps 6 months but last week noted severe shooting pain slightly below this. Mild radiation to the left side.  Dull  ache in left upper quadrant remains around 1/10 but sharp pains are up to 8 out of 10 and can have several rounds of pain under 10 seconds within a minute and then may not have pain again for another hour or 2 but intensity of pain concerns him.  He seems to note some belching along with this.  Denies constipation, diarrhea, dysuria, fever, flatus, urinary frequency, hematuria, nausea or vomiting.  Pain perhaps slightly aggravated by eating.  He has tried Mylanta and Tylenol-not sure if these helped very much.  No unintentional weight loss  He is compliant with omeprazole but symptoms persist-states this does not feel like his typical reflux  Also 2 weeks ago noted smell  of stale pipe smell best he can describe it. Had read from some folks about later changes in smell post covid.  Of note he had seen Dr. Lucia Gaskins in the past for metallic taste in his mouth but he did not mention this feeling the same  From last note "# LUQ Discomfort S:C/o LUQ/rib discomfort x 3 months. Mild discomfort and mostly watching tv lying on couch. Worse in evening so after meals. He today is not sure how pleuritic it is- previously reported as pleuritic. Denies fever, n/v/d, blood in stool, tar-like or coffee ground appearing stool. Sx worsen when supine. Discuss with Dr. Lovena Le in March 2020 and had CT Chest c/o CM done 04/21/2019 which showed the following:        IMPRESSION:        1. Stable exam. No new or progressive findings. No findings to        explain the patient's history of pleuritic left chest pain.        2. No change 5 mm left upper lobe pulmonary nodule, consistent with        benign etiology.        3. Coronary artery atherosclerosis. A/P: We reviewed his CT scan together and showed him that scan did pick up most of the area where he complains of pain- this provided some relief for patient's anxiety related to this.  Did discuss if symptoms persist or worsen could consider CT abdomen pelvis with contrast.  This  could possibly be reflux and we discussed trial of omeprazole-worse in the evening when he is laying down on the couch.  Could also be related to constipation potentially- if worsening issues with unclear cause could trial MiraLAX" A/P: 73 year old male with told left upper quadrant pain/lower rib pain for approximately 6 months.  Prior head CT of the chest to cardiology without obvious cause.  He has a bill patient that is persistent from that time but also over the last week has a sharp pain in left upper quadrant up to 8 out of 10.  Seems to be associated with belching-we did discuss could be gas related pain.  He also has some left lower quadrant pain on exam.  Given duration of left upper quadrant pain and now with some left lower quadrant pain on exam as well as sharp pains in left upper quadrant intermittently-we decided to get CT of the abdomen pelvis -In regards to the stable taste in his mouth he would like to have Covid antibodies checked and I think that is reasonable-possible exposure in the past -I do not strongly suspect acid reflux as cause-we will continue omeprazole 20 mg but not increased dose at this time. -Patient would like to go to the why of his pain-potentially consider GI referral if no obvious cause found  #Itchy ear S:right ear- drainage at night if laying on that side. Slightly itches.   A/P: Noted some cerumen around periphery of right ear-discussed using mineral oil to lubricate can also remove cerumen.  Patient is considering trying  #hyperlipidemia S: compliant with rosuvastatin 40 mg-increased from 20 mg previously Lab Results  Component Value Date   CHOL 154 05/25/2019   HDL 36.60 (L) 05/25/2019   LDLCALC 95 07/04/2018   LDLDIRECT 91.0 05/25/2019   TRIG 226.0 (H) 05/25/2019   CHOLHDL 4 05/25/2019   A/P: Hopefully LDL under 70-update lipid panel today   #Nonischemic card myopathy S: Continues follow-up with [facility].  Last visit in June.  Denies chest  pain/shortness of breath/worsening fatigue.  Compliant with benazepril and metoprolol A/P: Appears stable-continue follow-up with [facility] and continue current medications  Recommended follow up: As needed for acute concerns Future Appointments  Date Time Provider [place]  11/22/2019 11:30 AM [facility]-CT 1 [facility]-CT LB-[facility]    Lab/Order associations:   ICD-10-CM   1. LUQ pain  R10.12 CBC with Differential    Comprehensive metabolic panel    Lipase    Amylase    CT Abdomen Pelvis W Contrast  2. Hyperlipidemia, unspecified hyperlipidemia type  E78.5 Lipid panel  3. Nonischemic cardiomyopathy (HCC)  I42.8   4. Gastroesophageal reflux disease without esophagitis  K21.9   5. Exposure to COVID-19 virus  Z20.828 covid-19 coronavirus antibodies [place] lab. SARS-COV-2 IgG   Return precautions advised.  Garret Reddish, MD

## 2019-11-17 LAB — CBC WITH DIFFERENTIAL/PLATELET
Basophils Absolute: 0 10*3/uL (ref 0.0–0.1)
Basophils Relative: 0.7 % (ref 0.0–3.0)
Eosinophils Absolute: 0.4 10*3/uL (ref 0.0–0.7)
Eosinophils Relative: 4.7 % (ref 0.0–5.0)
HCT: 42 % (ref 39.0–52.0)
Hemoglobin: 14.1 g/dL (ref 13.0–17.0)
Lymphocytes Relative: 32.7 % (ref 12.0–46.0)
Lymphs Abs: 2.4 10*3/uL (ref 0.7–4.0)
MCHC: 33.7 g/dL (ref 30.0–36.0)
MCV: 88.4 fl (ref 78.0–100.0)
Monocytes Absolute: 0.6 10*3/uL (ref 0.1–1.0)
Monocytes Relative: 8.3 % (ref 3.0–12.0)
Neutro Abs: 4 10*3/uL (ref 1.4–7.7)
Neutrophils Relative %: 53.6 % (ref 43.0–77.0)
Platelets: 282 10*3/uL (ref 150.0–400.0)
RBC: 4.75 Mil/uL (ref 4.22–5.81)
RDW: 14.8 % (ref 11.5–15.5)
WBC: 7.4 10*3/uL (ref 4.0–10.5)

## 2019-11-17 LAB — LIPID PANEL
Cholesterol: 134 mg/dL (ref 0–200)
HDL: 35.4 mg/dL — ABNORMAL LOW (ref >39.00)
NonHDL: 98.38
Total CHOL/HDL Ratio: 4
Triglycerides: 243 mg/dL — ABNORMAL HIGH (ref 0.0–149.0)
VLDL: 48.6 mg/dL — ABNORMAL HIGH (ref 0.0–40.0)

## 2019-11-17 LAB — LIPASE: Lipase: 40 U/L (ref 11.0–59.0)

## 2019-11-17 LAB — COMPREHENSIVE METABOLIC PANEL
ALT: 19 U/L (ref 0–53)
AST: 19 U/L (ref 0–37)
Albumin: 4.5 g/dL (ref 3.5–5.2)
Alkaline Phosphatase: 78 U/L (ref 39–117)
BUN: 20 mg/dL (ref 6–23)
CO2: 29 mEq/L (ref 19–32)
Calcium: 9.6 mg/dL (ref 8.4–10.5)
Chloride: 104 mEq/L (ref 96–112)
Creatinine, Ser: 1.16 mg/dL (ref 0.40–1.50)
GFR: 61.7 mL/min (ref >60.00)
Glucose, Bld: 109 mg/dL — ABNORMAL HIGH (ref 70–99)
Potassium: 4.4 mEq/L (ref 3.5–5.1)
Sodium: 140 mEq/L (ref 135–145)
Total Bilirubin: 0.7 mg/dL (ref 0.2–1.2)
Total Protein: 6.8 g/dL (ref 6.0–8.3)

## 2019-11-17 LAB — LDL CHOLESTEROL, DIRECT: Direct LDL: 70 mg/dL

## 2019-11-17 LAB — AMYLASE: Amylase: 53 U/L (ref 27–131)

## 2019-11-17 LAB — SARS-COV-2 IGG: SARS-COV-2 IgG: 0.03

## 2019-11-22 ENCOUNTER — Other Ambulatory Visit: Payer: Self-pay

## 2019-11-22 ENCOUNTER — Ambulatory Visit (INDEPENDENT_AMBULATORY_CARE_PROVIDER_SITE_OTHER)
Admission: RE | Admit: 2019-11-22 | Discharge: 2019-11-22 | Disposition: A | Discharge status: Home / Self Care | Payer: Medicare Other | Source: Ambulatory Visit | Attending: Family Medicine | Admitting: Family Medicine

## 2019-11-22 DIAGNOSIS — R1012 Left upper quadrant pain: Secondary | ICD-10-CM

## 2019-11-22 MED ORDER — IOHEXOL 300 MG/ML  SOLN
100.0000 mL | Freq: Once | INTRAMUSCULAR | Status: AC | PRN
  Administered 2019-11-22: 100 mL via INTRAVENOUS

## 2019-11-23 NOTE — Progress Notes (Signed)
No obvious cause of pain found.  Reassuring CT scan on 1 hand but on the other hand I know it is frustrating to not have an exact cause of the pain. We have ruled out worrisome causes though now with CT of the chest and abdomen pelvis completed  They did find some changes of fatty liver.  I would suggest healthy eating/regular exercise and mild weight loss.  Also would avoid alcohol to further protect the liver.  Your liver tests have been reassuring from a blood work perspective but we should continue to monitor those at least yearly.You did have some prostate enlargement which is common for a man over age 31.  Your prior PSA trend has been low risk for prostate cancer.  We can discuss in the future if you would like to do another PSA but it is also reasonable to discontinue PSA checks beyond age 55.You also had plaque on the aorta which is also common for men over age 46

## 2019-12-02 ENCOUNTER — Other Ambulatory Visit: Payer: Self-pay | Admitting: Family Medicine

## 2019-12-02 ENCOUNTER — Other Ambulatory Visit: Payer: Self-pay | Admitting: Internal Medicine

## 2019-12-02 DIAGNOSIS — I4891 Unspecified atrial fibrillation: Secondary | ICD-10-CM

## 2019-12-04 NOTE — Telephone Encounter (Signed)
Xarelto 20mg  refill request received. Pt is 74 years old, weight-78.1kg, Crea-1.16 on 11/16/2019, last seen by Dr. Lovena Le on 07/27/2019, Diagnosis-AFIB, CrCl-62.81ml/min; Dose is appropriate based on dosing criteria. Will send in refill to requested pharmacy.

## 2019-12-12 ENCOUNTER — Other Ambulatory Visit: Payer: Self-pay | Admitting: Family Medicine

## 2020-02-15 ENCOUNTER — Ambulatory Visit (INDEPENDENT_AMBULATORY_CARE_PROVIDER_SITE_OTHER): Payer: Medicare PPO

## 2020-02-15 ENCOUNTER — Other Ambulatory Visit: Payer: Self-pay

## 2020-02-15 DIAGNOSIS — Z0000 Encounter for general adult medical examination without abnormal findings: Secondary | ICD-10-CM | POA: Diagnosis not present

## 2020-02-15 NOTE — Patient Instructions (Signed)
[name] , Thank you for taking time to come for your Medicare Wellness Visit. I appreciate your ongoing commitment to your health goals. Please review the following plan we discussed and let me know if I can assist you in the future.   Screening recommendations/referrals: Colorectal Screening: up to date; last colonoscopy 03/04/10  Vision and Dental Exams: Recommended annual ophthalmology exams for early detection of glaucoma and other disorders of the eye Recommended annual dental exams for proper oral hygiene  Vaccinations: Influenza vaccine: up to date; last 08/31/19 Pneumococcal vaccine: up to date; last 01/30/15 Tdap vaccine: recommended every 10 years; Please call your insurance company to determine your out of pocket expense. You may receive this vaccine at your local pharmacy or Health Dept. Shingles vaccine: You may receive this vaccine at your local pharmacy. (see handout)   Advanced directives: Please bring a copy of your POA (Power of Attorney) and/or Living Will to your next appointment.  Goals: Recommend to drink at least 6-8 8oz glasses of water per day and consume a balanced diet rich in fresh fruits and vegetables.   Next appointment: Please schedule your Annual Wellness Visit with your Nurse Health Advisor in one year.  Preventive Care 29 Years and Older, Male Preventive care refers to lifestyle choices and visits with your health care provider that can promote health and wellness. What does preventive care include?  A yearly physical exam. This is also called an annual well check.  Dental exams once or twice a year.  Routine eye exams. Ask your health care provider how often you should have your eyes checked.  Personal lifestyle choices, including:  Daily care of your teeth and gums.  Regular physical activity.  Eating a healthy diet.  Avoiding tobacco and drug use.  Limiting alcohol use.  Practicing safe sex.  Taking low doses of aspirin every day if  recommended by your health care provider..  Taking vitamin and mineral supplements as recommended by your health care provider. What happens during an annual well check? The services and screenings done by your health care provider during your annual well check will depend on your age, overall health, lifestyle risk factors, and family history of disease. Counseling  Your health care provider may ask you questions about your:  Alcohol use.  Tobacco use.  Drug use.  Emotional well-being.  Home and relationship well-being.  Sexual activity.  Eating habits.  History of falls.  Memory and ability to understand (cognition).  Work and work Statistician. Screening  You may have the following tests or measurements:  Height, weight, and BMI.  Blood pressure.  Lipid and cholesterol levels. These may be checked every 5 years, or more frequently if you are over 93 years old.  Skin check.  Lung cancer screening. You may have this screening every year starting at age 11 if you have a 30-pack-year history of smoking and currently smoke or have quit within the past 15 years.  Fecal occult blood test (FOBT) of the stool. You may have this test every year starting at age 48.  Flexible sigmoidoscopy or colonoscopy. You may have a sigmoidoscopy every 5 years or a colonoscopy every 10 years starting at age 50.  Prostate cancer screening. Recommendations will vary depending on your family history and other risks.  Hepatitis C blood test.  Hepatitis B blood test.  Sexually transmitted disease (STD) testing.  Diabetes screening. This is done by checking your blood sugar (glucose) after you have not eaten for a while (  fasting). You may have this done every 1-3 years.  Abdominal aortic aneurysm (AAA) screening. You may need this if you are a current or former smoker.  Osteoporosis. You may be screened starting at age 74 if you are at high risk. Talk with your health care provider about  your test results, treatment options, and if necessary, the need for more tests. Vaccines  Your health care provider may recommend certain vaccines, such as:  Influenza vaccine. This is recommended every year.  Tetanus, diphtheria, and acellular pertussis (Tdap, Td) vaccine. You may need a Td booster every 10 years.  Zoster vaccine. You may need this after age 12.  Pneumococcal 13-valent conjugate (PCV13) vaccine. One dose is recommended after age 73.  Pneumococcal polysaccharide (PPSV23) vaccine. One dose is recommended after age 34. Talk to your health care provider about which screenings and vaccines you need and how often you need them. This information is not intended to replace advice given to you by your health care provider. Make sure you discuss any questions you have with your health care provider. Document Released: 12/13/2015 Document Revised: 08/05/2016 Document Reviewed: 09/17/2015 Elsevier Interactive Patient Education  2017 [place] Prevention in the Home Falls can cause injuries. They can happen to people of all ages. There are many things you can do to make your home safe and to help prevent falls. What can I do on the outside of my home?  Regularly fix the edges of walkways and driveways and fix any cracks.  Remove anything that might make you trip as you walk through a door, such as a raised step or threshold.  Trim any bushes or trees on the path to your home.  Use bright outdoor lighting.  Clear any walking paths of anything that might make someone trip, such as rocks or tools.  Regularly check to see if handrails are loose or broken. Make sure that both sides of any steps have handrails.  Any raised decks and porches should have guardrails on the edges.  Have any leaves, snow, or ice cleared regularly.  Use sand or salt on walking paths during winter.  Clean up any spills in your garage right away. This includes oil or grease spills. What  can I do in the bathroom?  Use night lights.  Install grab bars by the toilet and in the tub and shower. Do not use towel bars as grab bars.  Use non-skid mats or decals in the tub or shower.  If you need to sit down in the shower, use a plastic, non-slip stool.  Keep the floor dry. Clean up any water that spills on the floor as soon as it happens.  Remove soap buildup in the tub or shower regularly.  Attach bath mats securely with double-sided non-slip rug tape.  Do not have throw rugs and other things on the floor that can make you trip. What can I do in the bedroom?  Use night lights.  Make sure that you have a light by your bed that is easy to reach.  Do not use any sheets or blankets that are too big for your bed. They should not hang down onto the floor.  Have a firm chair that has side arms. You can use this for support while you get dressed.  Do not have throw rugs and other things on the floor that can make you trip. What can I do in the kitchen?  Clean up any spills right away.  Avoid  walking on wet floors.  Keep items that you use a lot in easy-to-reach places.  If you need to reach something above you, use a strong step stool that has a grab bar.  Keep electrical cords out of the way.  Do not use floor polish or wax that makes floors slippery. If you must use wax, use non-skid floor wax.  Do not have throw rugs and other things on the floor that can make you trip. What can I do with my stairs?  Do not leave any items on the stairs.  Make sure that there are handrails on both sides of the stairs and use them. Fix handrails that are broken or loose. Make sure that handrails are as long as the stairways.  Check any carpeting to make sure that it is firmly attached to the stairs. Fix any carpet that is loose or worn.  Avoid having throw rugs at the top or bottom of the stairs. If you do have throw rugs, attach them to the floor with carpet tape.  Make sure  that you have a light switch at the top of the stairs and the bottom of the stairs. If you do not have them, ask someone to add them for you. What else can I do to help prevent falls?  Wear shoes that:  Do not have high heels.  Have rubber bottoms.  Are comfortable and fit you well.  Are closed at the toe. Do not wear sandals.  If you use a stepladder:  Make sure that it is fully opened. Do not climb a closed stepladder.  Make sure that both sides of the stepladder are locked into place.  Ask someone to hold it for you, if possible.  Clearly mark and make sure that you can see:  Any grab bars or handrails.  First and last steps.  Where the edge of each step is.  Use tools that help you move around (mobility aids) if they are needed. These include:  Canes.  Walkers.  Scooters.  Crutches.  Turn on the lights when you go into a dark area. Replace any light bulbs as soon as they burn out.  Set up your furniture so you have a clear path. Avoid moving your furniture around.  If any of your floors are uneven, fix them.  If there are any pets around you, be aware of where they are.  Review your medicines with your doctor. Some medicines can make you feel dizzy. This can increase your chance of falling. Ask your doctor what other things that you can do to help prevent falls. This information is not intended to replace advice given to you by your health care provider. Make sure you discuss any questions you have with your health care provider. Document Released: 09/12/2009 Document Revised: 04/23/2016 Document Reviewed: 12/21/2014 Elsevier Interactive Patient Education  2017 [employer].

## 2020-02-15 NOTE — Progress Notes (Signed)
Subjective:   [name] is a 74 y.o. male who presents for Medicare Annual/Subsequent preventive examination.  Review of Systems:   Cardiac Risk Factors include: advanced age (>30men, >40 women);male gender;dyslipidemia;hypertension    Objective:    Vitals: There were no vitals taken for this visit.  There is no height or weight on file to calculate BMI.  Advanced Directives 02/15/2020 06/07/2019 07/06/2017  Does Patient Have a Medical Advance Directive? Yes No Yes  Type of Advance Directive Living will;Healthcare Power of Attorney - -  Does patient want to make changes to medical advance directive? No - Patient declined - -  Copy of [place] in Chart? No - copy requested - -    Tobacco Social History   Tobacco Use  Smoking Status Former Smoker  . Packs/day: 1.00  . Years: 20.00  . Pack years: 20.00  . Types: Cigarettes  . Quit date: 11/04/1991  . Years since quitting: 28.3  Smokeless Tobacco Never Used     Counseling given: Not Answered   Clinical Intake:  Pre-visit preparation completed: Yes  Pain : No/denies pain  Diabetes: No  How often do you need to have someone help you when you read instructions, pamphlets, or other written materials from your doctor or pharmacy?: 1 - Never  Interpreter Needed?: No  Information entered by :: Denman George LPN  Past Medical History:  Diagnosis Date  . Abscess    right posterior neck  . Bradycardia   . CVA (cerebral vascular accident) ([place])   . Elevated LFTs   . External hemorrhoids   . GERD (gastroesophageal reflux disease)   . HTN (hypertension)   . Ischemic optic neuropathy   . Other and unspecified hyperlipidemia   . Sinoatrial node dysfunction (HCC)   . Spinal stenosis    Past Surgical History:  Procedure Laterality Date  . ABSCESS DRAINAGE     righ tposterior neck  . CARDIAC CATHETERIZATION  1996  . CHOLECYSTECTOMY  02/07/2003  . COLONOSCOPY  06/23/2004  . EYE SURGERY     left eye  . left ingunial hernia  06/08/2011  . PACEMAKER INSERTION    . right hip replacement     2012 Dr. Maureen Ralphs   Family History  Problem Relation Age of Onset  . Lung cancer Mother        mets to brain  . Heart attack Mother 4  . Hypertension Mother   . Brain cancer Mother   . Heart attack Father        mid 18s, smoker.   . Diabetes Father   . Diabetes Paternal Grandfather   . Healthy Brother    Social History   Socioeconomic History  . Marital status: Married    Spouse name: Not on file  . Number of children: 1  . Years of education: 52  . Highest education level: Not on file  Occupational History  . Not on file  Tobacco Use  . Smoking status: Former Smoker    Packs/day: 1.00    Years: 20.00    Pack years: 20.00    Types: Cigarettes    Quit date: 11/04/1991    Years since quitting: 28.3  . Smokeless tobacco: Never Used  Substance and Sexual Activity  . Alcohol use: Yes    Alcohol/week: 0.0 standard drinks    Comment: occ  . Drug use: No  . Sexual activity: Not on file  Other Topics Concern  . Not on file  Social History Narrative   Married. 1 child and 1 step child. 2 grandchildren.       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 Difficulty of Paying Living Expenses:   Food Insecurity:   . Worried About Charity fundraiser in the Last Year:   . Arboriculturist in the Last Year:   Transportation Needs:   . Film/video editor (Medical):   Marland Kitchen Lack of Transportation (Non-Medical):   Physical Activity:   . Days of Exercise per Week:   . Minutes of Exercise per Session:   Stress:   . Feeling of Stress :   Social Connections:   . Frequency of Communication with Friends and Family:   . Frequency of Social Gatherings with Friends and Family:   . Attends Religious Services:   . Active Member of Clubs or  Organizations:   . Attends Archivist Meetings:   Marland Kitchen Marital Status:     Outpatient Encounter Medications as of 02/15/2020  Medication Sig  . benazepril (LOTENSIN) 5 MG tablet Take 1 tablet by mouth once daily  . cyclobenzaprine (FLEXERIL) 5 MG tablet Take 1 tablet (5 mg total) by mouth 3 (three) times daily as needed for muscle spasms.  . finasteride (PROSCAR) 5 MG tablet Take 1 tablet by mouth once daily  . fish oil-omega-3 fatty acids 1000 MG capsule Take 2 g by mouth daily. Reported on 02/18/2016  . gemfibrozil (LOPID) 600 MG tablet TAKE 1 TABLET BY MOUTH TWICE DAILY BEFORE A MEAL  . indomethacin (INDOCIN) 25 MG capsule Take 1 capsule (25 mg total) by mouth 2 (two) times daily as needed (pain). Take with a meal. 3 days maximun  . metoprolol succinate (TOPROL-XL) 25 MG 24 hr tablet Take 1 tablet by mouth once daily  . Multiple Vitamin (MULTIVITAMIN) tablet Take 1 tablet by mouth daily.    Marland Kitchen omeprazole (PRILOSEC) 20 MG capsule Take 1 capsule by mouth once daily  . rosuvastatin (CRESTOR) 40 MG tablet Take 1 tablet (40 mg total) by mouth daily.  . tadalafil (CIALIS) 5 MG tablet TAKE ONE TABLET BY MOUTH DAILY AS NEEDED FOR ERECTILE DYSFUNCTION  . XARELTO 20 MG TABS tablet TAKE 1 TABLET BY MOUTH ONCE DAILY WITH SUPPER  . zolpidem (AMBIEN) 5 MG tablet Take 1 tablet (5 mg total) by mouth at bedtime.  . [DISCONTINUED] tamsulosin (FLOMAX) 0.4 MG CAPS capsule Take 1 capsule by mouth once daily (Patient not taking: Reported on 11/16/2019)   No facility-administered encounter medications on file as of 02/15/2020.    Activities of Daily Living In your present state of health, do you have any difficulty performing the following activities: 02/15/2020 11/16/2019  Hearing? Y N  Comment bilateral hearing aids -  Vision? N N  Difficulty concentrating or making decisions? N N  Walking or climbing stairs? N N  Dressing or bathing? N N  Doing errands, shopping? N N  Preparing Food and eating ? N  -  Using the Toilet? N -  In the past six months, have you accidently leaked urine? N -  Do you have problems with loss of bowel control? N -  Managing your Medications? N -  Managing your Finances? N -  Housekeeping or managing your Housekeeping? N -  Some recent data might be  hidden    Patient Care Team: Marin Olp, MD as PCP - General (Family Medicine) Alda Berthold, DO as Consulting Physician (Neurology) Evans Lance, MD as Consulting Physician (Cardiology) Gwynneth Albright (Inactive) as Consulting Physician (Cardiology) Chaya Jan, MD as Consulting Physician (Ophthalmology)   Assessment:   This is a routine wellness examination for [place].  Exercise Activities and Dietary recommendations Current Exercise Habits: The patient does not participate in regular exercise at present  Goals    . patient stated     To maintain your health        Fall Risk Fall Risk  02/15/2020 06/07/2019 04/18/2019 07/04/2018 07/06/2017  Falls in the past year? 0 0 0 No Yes  Comment - - - - due to vision issues   Number falls in past yr: 0 - 0 - -  Injury with Fall? 0 - 0 - -  Follow up Falls evaluation completed;Education provided;Falls prevention discussed - - - -   Is the patient's home free of loose throw rugs in walkways, pet beds, electrical cords, etc?   yes      Grab bars in the bathroom? yes      Handrails on the stairs?   yes      Adequate lighting?   yes  Depression Screen PHQ 2/9 Scores 02/15/2020 04/18/2019 07/04/2018 07/06/2017  PHQ - 2 Score 0 0 0 0    Cognitive Function MMSE - Mini Mental State Exam 07/06/2017  Not completed: (No Data)     6CIT Screen 02/15/2020  What Year? 0 points  What month? 0 points  What time? 0 points  Count back from 20 0 points  Months in reverse 0 points  Repeat phrase 0 points  Total Score 0    Immunization History  Administered Date(s) Administered  . Hepatitis A, Adult 08/08/2015, 05/26/2016  . Influenza Split  11/09/2011, 09/20/2012, 09/12/2018  . Influenza Whole 11/14/2007  . Influenza, High Dose Seasonal PF 08/08/2015, 09/02/2017, 08/31/2019  . Influenza,inj,Quad PF,6+ Mos 09/14/2013, 12/27/2014, 09/12/2018  . Influenza-Unspecified 08/22/2019  . Pneumococcal Conjugate-13 01/30/2015  . Pneumococcal Polysaccharide-23 11/09/2011  . Td 10/04/2008    Qualifies for Shingles Vaccine? Discussed and patient will check with pharmacy for coverage.  Patient education handout provided   Screening Tests Health Maintenance  Topic Date Due  . TETANUS/TDAP  10/04/2018  . COLONOSCOPY  03/04/2020  . INFLUENZA VACCINE  Completed  . Hepatitis C Screening  Completed  . PNA vac Low Risk Adult  Completed   Cancer Screenings: Lung: Low Dose CT Chest recommended if Age 81-80 years, 30 pack-year currently smoking OR have quit w/in 15years. Patient does not qualify. Colorectal: colonoscopy 03/04/10 (repeat in 10 years)      Plan:  I have personally reviewed and addressed the Medicare Annual Wellness questionnaire and have noted the following in the patient's chart:  A. Medical and social history B. Use of alcohol, tobacco or illicit drugs  C. Current medications and supplements D. Functional ability and status E.  Nutritional status F.  Physical activity G. Advance directives H. List of other physicians I.  Hospitalizations, surgeries, and ER visits in previous 12 months J.  [place] such as hearing and vision if needed, cognitive and depression L. Referrals, records requested, and appointments- none   In addition, I have reviewed and discussed with patient certain preventive protocols, quality metrics, and best practice recommendations. A written personalized care plan for preventive services as well as general preventive health  recommendations were provided to patient.   Signed,  Denman George, LPN  Nurse Health Advisor   Nurse Notes: no additional

## 2020-02-23 ENCOUNTER — Telehealth: Payer: Self-pay | Admitting: Internal Medicine

## 2020-02-23 NOTE — Telephone Encounter (Signed)
Patient called because he thought he needed to come in for a pacemaker check every 6 months. He was shocked when I told him that based on his recall he wans't due to come in until September 2021. Please let him know if he needs to be seen sooner

## 2020-02-23 NOTE — Telephone Encounter (Signed)
Pt does not have remote monitoring therefore will need device clinic appt for interrogation.  Advised will have scheduling contact him to set up device clinic appt.

## 2020-02-26 ENCOUNTER — Other Ambulatory Visit: Payer: Self-pay | Admitting: Family Medicine

## 2020-02-26 NOTE — Telephone Encounter (Signed)
Last refill: 5.19.20 #90, 1 Last OV: 12.17.20 dx. LUQ pain

## 2020-03-12 ENCOUNTER — Encounter: Payer: Self-pay | Admitting: Physician Assistant

## 2020-03-14 ENCOUNTER — Ambulatory Visit (INDEPENDENT_AMBULATORY_CARE_PROVIDER_SITE_OTHER): Payer: Medicare PPO | Admitting: *Deleted

## 2020-03-14 ENCOUNTER — Other Ambulatory Visit: Payer: Self-pay

## 2020-03-14 DIAGNOSIS — I495 Sick sinus syndrome: Secondary | ICD-10-CM | POA: Diagnosis not present

## 2020-03-22 LAB — CUP PACEART INCLINIC DEVICE CHECK
Brady Statistic RA Percent Paced: 100 %
Brady Statistic RV Percent Paced: 7 %
Date Time Interrogation Session: 20210415151800
Implantable Lead Implant Date: 20031017
Implantable Lead Implant Date: 20031017
Implantable Lead Location: 753859
Implantable Lead Location: 753860
Implantable Lead Model: 5076
Implantable Lead Model: 5076
Implantable Pulse Generator Implant Date: 20120210
Lead Channel Impedance Value: 351 Ohm
Lead Channel Impedance Value: 409 Ohm
Lead Channel Pacing Threshold Amplitude: 0.5 V
Lead Channel Pacing Threshold Amplitude: 1 V
Lead Channel Pacing Threshold Pulse Width: 0.4 ms
Lead Channel Pacing Threshold Pulse Width: 0.4 ms
Lead Channel Sensing Intrinsic Amplitude: 16.9 mV
Lead Channel Sensing Intrinsic Amplitude: 3.9 mV
Lead Channel Setting Pacing Amplitude: 2 V
Lead Channel Setting Pacing Amplitude: 2.4 V
Lead Channel Setting Pacing Pulse Width: 0.4 ms
Pulse Gen Serial Number: 66063261

## 2020-03-22 NOTE — Progress Notes (Signed)
Pacemaker check in clinic. Normal device function. Thresholds, sensing, impedances consistent with previous measurements. Device programmed to maximize longevity. No mode switch or high ventricular rates noted. Device programmed at appropriate safety margins. Histogram distribution appropriate for patient activity level. Device programmed to optimize intrinsic conduction. Estimated longevity 3 yrs 9 months. Patient not enrolled in remote follow ups, will check in clinic in 6 months. Patient education completed.

## 2020-03-25 ENCOUNTER — Telehealth: Payer: Self-pay

## 2020-03-25 ENCOUNTER — Ambulatory Visit: Payer: Medicare PPO | Admitting: Physician Assistant

## 2020-03-25 ENCOUNTER — Encounter: Payer: Self-pay | Admitting: Physician Assistant

## 2020-03-25 VITALS — BP 124/72 | HR 88 | Temp 97.9°F | Ht 68.0 in | Wt 165.0 lb

## 2020-03-25 DIAGNOSIS — Z1211 Encounter for screening for malignant neoplasm of colon: Secondary | ICD-10-CM | POA: Diagnosis not present

## 2020-03-25 DIAGNOSIS — K648 Other hemorrhoids: Secondary | ICD-10-CM | POA: Diagnosis not present

## 2020-03-25 DIAGNOSIS — R1012 Left upper quadrant pain: Secondary | ICD-10-CM

## 2020-03-25 MED ORDER — SUTAB 1479-225-188 MG PO TABS: 1.0000 | ORAL_TABLET | ORAL | 0 refills | Status: DC

## 2020-03-25 NOTE — Patient Instructions (Addendum)
If you are age 74 or older, your body mass index should be between 23-30. Your Body mass index is 25.09 kg/m. If this is out of the aforementioned range listed, please consider follow up with your Primary Care Provider.  If you are age 55 or younger, your body mass index should be between 19-25. Your Body mass index is 25.09 kg/m. If this is out of the aformentioned range listed, please consider follow up with your Primary Care Provider.   You have been scheduled for a colonoscopy. Please follow written instructions given to you at your visit today.  Please pick up your prep supplies at the pharmacy within the next 1-3 days. If you use inhalers (even only as needed), please bring them with you on the day of your procedure.  You will be contaced by our office prior to your procedure for directions on holding your Xarelto.  If you do not hear from our office 1 week prior to your scheduled procedure, please call [phone] to discuss.

## 2020-03-25 NOTE — Telephone Encounter (Signed)
[facility] Pre-operative Risk Assessment     Request for surgical clearance:     Endoscopy Procedure  What type of surgery is being performed?     Colonoscopy  When is this surgery scheduled?     04/23/20  What type of clearance is required ?   Pharmacy  Are there any medications that need to be held prior to surgery and how long? Xarelto, 24 hours  Practice name and name of physician performing surgery?      [facility]  What is your office phone and fax number?      Phone- [phone]  [phone]  Anesthesia type (None, local, MAC, general) ?       MAC

## 2020-03-25 NOTE — Progress Notes (Signed)
Subjective:    Patient ID: [name], male    DOB: Jan 18, 1946, 74 y.o.   MRN: [id number]  HPI [name] is a 74 year old white male, who comes in today to discuss recall colonoscopy in setting of chronic anticoagulation with Xarelto.  Patient last had colonoscopy in 2011 per Dr. Delfin Higgins which was negative other than external hemorrhoids. Patient says that he has been having some difficulty with left upper quadrant discomfort off and on over the past year.  He says at times this was sharp and other times dull.  Generally pain episodes are very brief.  He is not aware of any aggravating factors.  He says the pain seems to move some but is generally in the left upper abdomen or left mid abdomen.  He has not noted any changes in his bowel habits, no changes with p.o. intake etc.  He says he has a large external hemorrhoid that occasionally causes a little bit of bleeding but has not noticed any blood mixed in with his bowel movements. He has a prescription for omeprazole but says he does not take this on a regular basis perhaps every other day and does not have daily symptoms with heartburn or indigestion, denies any dysphagia or odynophagia. CT scan of the abdomen and pelvis was done in December 2020 which showed a fatty liver, status post cholecystectomy and was an otherwise negative exam. Patient has history of sinus node dysfunction, status post pacemaker and is pacemaker dependent, has congestive heart failure with EF of 60 to 65% as of 2016, coronary artery disease atrial fibrillation and very remote history of a CVA involving the optic nerve.  He says he actually had 2 events and these were at least 20 years ago.  He has not had any issues with atrial fibrillation over the past 4 to 5 years.  Review of Systems Pertinent positive and negative review of systems were noted in the above HPI section.  All other review of systems was otherwise negative.  Outpatient Encounter Medications as of  03/25/2020  Medication Sig  . benazepril (LOTENSIN) 5 MG tablet Take 1 tablet by mouth once daily  . cyclobenzaprine (FLEXERIL) 5 MG tablet Take 1 tablet (5 mg total) by mouth 3 (three) times daily as needed for muscle spasms.  . finasteride (PROSCAR) 5 MG tablet Take 1 tablet by mouth once daily  . fish oil-omega-3 fatty acids 1000 MG capsule Take 2 g by mouth daily. Reported on 02/18/2016  . gemfibrozil (LOPID) 600 MG tablet TAKE 1 TABLET BY MOUTH TWICE DAILY BEFORE A MEAL  . indomethacin (INDOCIN) 25 MG capsule Take 1 capsule (25 mg total) by mouth 2 (two) times daily as needed (pain). Take with a meal. 3 days maximun  . metoprolol succinate (TOPROL-XL) 25 MG 24 hr tablet Take 1 tablet by mouth once daily  . Multiple Vitamin (MULTIVITAMIN) tablet Take 1 tablet by mouth daily.    Marland Kitchen omeprazole (PRILOSEC) 20 MG capsule Take 1 capsule by mouth once daily  . rosuvastatin (CRESTOR) 40 MG tablet Take 1 tablet (40 mg total) by mouth daily.  . tadalafil (CIALIS) 5 MG tablet TAKE ONE TABLET BY MOUTH DAILY AS NEEDED FOR ERECTILE DYSFUNCTION  . XARELTO 20 MG TABS tablet TAKE 1 TABLET BY MOUTH ONCE DAILY WITH SUPPER  . zolpidem (AMBIEN) 5 MG tablet TAKE 1 TABLET BY MOUTH AT BEDTIME  . Sodium Sulfate-Mag Sulfate-KCl (SUTAB) [phone] MG TABS Take 1 kit by mouth as directed.   No facility-administered  encounter medications on file as of 03/25/2020.   No Known Allergies Patient Active Problem List   Diagnosis Date Noted  . Paroxysmal atrial fibrillation ([place]) 07/27/2019  . Anticoagulation adequate 06/07/2019  . Convergence insufficiency 06/07/2019  . Fitting or adjustment of cardiac pacemaker 06/07/2019  . Pacemaker-dependent due to native cardiac rhythm insufficient to support life 06/07/2019  . Palpitations 06/07/2019  . History of nuclear stress test 06/07/2019  . S/P cardiac cath 06/07/2019  . Non-occlusive coronary artery disease 06/07/2019  . Double vision 07/04/2018  . Trigger finger, right  middle finger 03/01/2018  . Hyperglycemia 06/10/2017  . Solitary pulmonary nodule 05/26/2016  . CAD (coronary artery disease) 05/26/2016  . Nonischemic cardiomyopathy ([place]) 04/23/2016  . Chronic systolic heart failure ([place]) 12/01/2015  . Chest pressure 12/01/2015  . Left chest pressure 03/19/2015  . Insomnia 01/30/2015  . Erectile dysfunction 01/30/2015  . CVA (cerebral infarction) 01/30/2015  . Former smoker 01/30/2015  . Atrial fibrillation and flutter ([place]) 01/01/2015  . Allergic rhinitis 10/11/2014  . BPH associated with nocturia 08/14/2014  . Ventricular tachycardia, non-sustained ([place]) 01/25/2013  . Sinoatrial node dysfunction (HCC)   . SPINAL STENOSIS, LUMBAR 08/22/2010  . GERD 03/17/2010  . Cardiac pacemaker in situ 12/28/2009  . Gout 11/11/2009  . Hyperlipidemia 07/12/2007  . NEUROPATHY, ISCHEMIC OPTIC 06/02/2007  . Essential hypertension 06/02/2007   Social History   Socioeconomic History  . Marital status: Married    Spouse name: Not on file  . Number of children: 1  . Years of education: 92  . Highest education level: Not on file  Occupational History  . Not on file  Tobacco Use  . Smoking status: Former Smoker    Packs/day: 1.00    Years: 20.00    Pack years: 20.00    Types: Cigarettes    Quit date: 11/04/1991    Years since quitting: 28.4  . Smokeless tobacco: Never Used  Substance and Sexual Activity  . Alcohol use: Yes    Alcohol/week: 0.0 standard drinks    Comment: occ  . Drug use: No  . Sexual activity: Not on file  Other Topics Concern  . Not on file  Social History Narrative   Married. 1 child and 1 step child. 2 grandchildren.       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 Difficulty of Paying Living Expenses:   Food Insecurity:   . Worried About Charity fundraiser in the Last Year:   . Academic librarian in the Last Year:   Transportation Needs:   . Film/video editor (Medical):   Marland Kitchen Lack of Transportation (Non-Medical):   Physical Activity:   . Days of Exercise per Week:   . Minutes of Exercise per Session:   Stress:   . Feeling of Stress :   Social Connections:   . Frequency of Communication with Friends and Family:   . Frequency of Social Gatherings with Friends and Family:   . Attends Religious Services:   . Active Member of Clubs or Organizations:   . Attends Archivist Meetings:   Marland Kitchen Marital Status:   Intimate Partner Violence:   . Fear of Current or Ex-Partner:   . Emotionally Abused:   Marland Kitchen Physically Abused:   . Sexually Abused:  Mr. Brittian family history includes Brain cancer in his mother; Diabetes in his father and paternal grandfather; Healthy in his brother; Heart attack in his father; Heart attack (age of onset: 31) in his mother; Hypertension in his mother; Lung cancer in his mother.      Objective:    Vitals:   03/25/20 1506  BP: 124/72  Pulse: 88  Temp: 97.9 F (36.6 C)  SpO2: 97%    Physical Exam Well-developed well-nourished older white male in no acute distress.  Height, Weight, 165 BMI 25.0  HEENT; nontraumatic normocephalic, EOMI, PER R LA, sclera anicteric. Oropharynx; not examined today Neck; supple, no JVD Cardiovascular; regular rate and rhythm with S1-S2, no murmur rub or gallop, pacemaker left chest wall Pulmonary; Clear bilaterally Abdomen; soft, nontender, nondistended, no palpable mass or hepatosplenomegaly, bowel sounds are active Rectal; not done today Skin; benign exam, no jaundice rash or appreciable lesions Extremities; no clubbing cyanosis or edema skin warm and dry Neuro/Psych; alert and oriented x4, grossly nonfocal mood and affect appropriate      Assessment & Plan:   #71 74 year old white male due for follow-up screening colonoscopy.  Last exam 2011 - with exception of external hemorrhoid #2 left  upper quadrant/left mid quadrant abdominal pain, intermittent/brief episodes, recent CT negative.  Etiology not clear, consider colonic spasm #3 chronic anticoagulation-on Xarelto #4 history of atrial fibrillation #5 sinus node dysfunction status post pacemaker/pacemaker dependent #6 congestive heart failure EF 60 to 65% #7 remote history of CVA/approximately 20 years ago #8 hypertension #9 GERD-mild intermittent symptoms not using daily PPI  Plan; Patient will be scheduled for colonoscopy with Dr. Silverio Decamp.  Procedure was discussed in detail with the patient including indications risks and benefits and he is agreeable to proceed. He will need to hold Xarelto for 24 hours prior to procedure.  We will communicate with his cardiologist Dr. Lovena Le to assure this is reasonable for this patient. Patient has completed COVID-19 vaccination.     Genia Harold PA-C 03/25/2020   Cc: Marin Olp, MD

## 2020-03-26 NOTE — Telephone Encounter (Signed)
Patient with diagnosis of afib on Xarelto for anticoagulation.    Procedure:   Colonoscopy Date of procedure: 04/23/20  CHADS2-VASc score of  5 (HTN, AGE, stroke/tia x 2, CAD)  CrCl 54 ml/min  Per office protocol, patient can hold Xarelto for 1 day prior to procedure.

## 2020-03-28 NOTE — Telephone Encounter (Signed)
Left voicemail for patient to call the office to be informed to stop his Xarelto 24 hours prior to his Colonoscopy.

## 2020-04-01 ENCOUNTER — Telehealth: Payer: Self-pay | Admitting: Physician Assistant

## 2020-04-01 NOTE — Telephone Encounter (Signed)
Patient notified about when to stop and restart Xarelto

## 2020-04-01 NOTE — Telephone Encounter (Signed)
Patient's wife notified to have him stop his Xarelto the day before his Colonoscopy and restart after his colonoscopy the same day.

## 2020-04-02 NOTE — Progress Notes (Signed)
Reviewed and agree with documentation and assessment and plan. K. Veena  , MD   

## 2020-04-11 ENCOUNTER — Encounter: Payer: Self-pay | Admitting: Gastroenterology

## 2020-04-23 ENCOUNTER — Encounter: Payer: Self-pay | Admitting: Gastroenterology

## 2020-04-23 ENCOUNTER — Ambulatory Visit (AMBULATORY_SURGERY_CENTER): Payer: Medicare PPO | Admitting: Gastroenterology

## 2020-04-23 ENCOUNTER — Other Ambulatory Visit: Payer: Self-pay

## 2020-04-23 VITALS — BP 124/74 | HR 75 | Temp 96.9°F | Resp 18 | Ht 68.0 in | Wt 165.0 lb

## 2020-04-23 DIAGNOSIS — Z1211 Encounter for screening for malignant neoplasm of colon: Secondary | ICD-10-CM

## 2020-04-23 DIAGNOSIS — D124 Benign neoplasm of descending colon: Secondary | ICD-10-CM | POA: Diagnosis not present

## 2020-04-23 DIAGNOSIS — D123 Benign neoplasm of transverse colon: Secondary | ICD-10-CM

## 2020-04-23 DIAGNOSIS — I10 Essential (primary) hypertension: Secondary | ICD-10-CM | POA: Diagnosis not present

## 2020-04-23 DIAGNOSIS — I2510 Atherosclerotic heart disease of native coronary artery without angina pectoris: Secondary | ICD-10-CM | POA: Diagnosis not present

## 2020-04-23 DIAGNOSIS — D122 Benign neoplasm of ascending colon: Secondary | ICD-10-CM | POA: Diagnosis not present

## 2020-04-23 DIAGNOSIS — D125 Benign neoplasm of sigmoid colon: Secondary | ICD-10-CM

## 2020-04-23 MED ORDER — SODIUM CHLORIDE 0.9 % IV SOLN
500.0000 mL | Freq: Once | INTRAVENOUS | Status: DC
Start: 2020-04-23 — End: 2020-04-23

## 2020-04-23 NOTE — Progress Notes (Signed)
Temp-JB VS-DT 

## 2020-04-23 NOTE — Progress Notes (Signed)
Called to room to assist during endoscopic procedure.  Patient ID and intended procedure confirmed with present staff. Received instructions for my participation in the procedure from the performing physician.  

## 2020-04-23 NOTE — Progress Notes (Signed)
Report given to PACU, vss 

## 2020-04-23 NOTE — Op Note (Addendum)
[facility] Patient Name: [name] Procedure Date: 04/23/2020 2:34 PM MRN: [id number] Endoscopist: Mauri Pole , MD Age: 74 Referring MD:  Date of Birth: 1946/08/12 Gender: Male Account #: [id number] Procedure:                Colonoscopy Indications:              Screening for colorectal malignant neoplasm Medicines:                Monitored Anesthesia Care Procedure:                Pre-Anesthesia Assessment:                           - Prior to the procedure, a History and Physical                            was performed, and patient medications and                            allergies were reviewed. The patient's tolerance of                            previous anesthesia was also reviewed. The risks                            and benefits of the procedure and the sedation                            options and risks were discussed with the patient.                            All questions were answered, and informed consent                            was obtained. Prior Anticoagulants: The patient                            last took Xarelto (rivaroxaban) 2 days prior to the                            procedure. ASA Grade Assessment: III - A patient                            with severe systemic disease. After reviewing the                            risks and benefits, the patient was deemed in                            satisfactory condition to undergo the procedure.                           After obtaining informed consent, the colonoscope  was passed under direct vision. Throughout the                            procedure, the patient's blood pressure, pulse, and                            oxygen saturations were monitored continuously. The                            Colonoscope was introduced through the anus and                            advanced to the the cecum, identified by                            appendiceal  orifice and ileocecal valve. The                            colonoscopy was performed without difficulty. The                            patient tolerated the procedure well. The quality                            of the bowel preparation was adequate. The terminal                            ileum, ileocecal valve, appendiceal orifice, and                            rectum were photographed. Scope In: 2:42:19 PM Scope Out: 3:03:39 PM Scope Withdrawal Time: 0 hours 17 minutes 59 seconds  Total Procedure Duration: 0 hours 21 minutes 20 seconds  Findings:                 The perianal and digital rectal examinations were                            normal.                           Four sessile polyps were found in the sigmoid                            colon, transverse colon and ascending colon. The                            polyps were 5 to 12 mm in size. These polyps were                            removed with a cold snare. Resection and retrieval                            were complete.  A 2 mm polyp was found in the descending colon. The                            polyp was sessile. The polyp was removed with a                            cold biopsy forceps. Resection and retrieval were                            complete.                           Scattered small-mouthed diverticula were found in                            the sigmoid colon, descending colon, transverse                            colon and ascending colon.                           Non-bleeding internal hemorrhoids were found during                            retroflexion. The hemorrhoids were small. Complications:            No immediate complications. Estimated Blood Loss:     Estimated blood loss was minimal. Impression:               - Four 5 to 12 mm polyps in the sigmoid colon, in                            the transverse colon and in the ascending colon,                             removed with a cold snare. Resected and retrieved.                           - One 2 mm polyp in the descending colon, removed                            with a cold biopsy forceps. Resected and retrieved.                           - Mild diverticulosis in the sigmoid colon, in the                            descending colon, in the transverse colon and in                            the ascending colon.                           - Non-bleeding internal hemorrhoids. Recommendation:           -  Patient has a contact number available for                            emergencies. The signs and symptoms of potential                            delayed complications were discussed with the                            patient. Return to normal activities tomorrow.                            Written discharge instructions were provided to the                            patient.                           - Resume previous diet.                           - Continue present medications.                           - Await pathology results.                           - Repeat colonoscopy in 3 years for surveillance                            based on pathology results.                           - Resume Xarelto (rivaroxaban) at prior dose                            tomorrow. Mauri Pole, MD 04/23/2020 3:14:25 PM This report has been signed electronically.

## 2020-04-23 NOTE — Patient Instructions (Addendum)
HANDOUTS PROVIDED ON: POLYPS, DIVERTICULOSIS, & HEMORRHOIDS  The polyps removed today have been sent for pathology.  The results can take 1-3 weeks to receive.  When your next colonoscopy should occur will be based on the pathology results.    You may resume your previous diet and medication schedule.  YOU MAY RESUME YOUR [place] ON Wednesday, MAY 26TH.  Thank you for allowing [place] to care for you today!!!   YOU HAD AN ENDOSCOPIC PROCEDURE TODAY AT [facility]:   Refer to the procedure report that was given to you for any specific questions about what was found during the examination.  If the procedure report does not answer your questions, please call your gastroenterologist to clarify.  If you requested that your care partner not be given the details of your procedure findings, then the procedure report has been included in a sealed envelope for you to review at your convenience later.  YOU SHOULD EXPECT: Some feelings of bloating in the abdomen. Passage of more gas than usual.  Walking can help get rid of the air that was put into your GI tract during the procedure and reduce the bloating. If you had a lower endoscopy (such as a colonoscopy or flexible sigmoidoscopy) you may notice spotting of blood in your stool or on the toilet paper. If you underwent a bowel prep for your procedure, you may not have a normal bowel movement for a few days.  Please Note:  You might notice some irritation and congestion in your nose or some drainage.  This is from the oxygen used during your procedure.  There is no need for concern and it should clear up in a day or so.  SYMPTOMS TO REPORT IMMEDIATELY:   Following lower endoscopy (colonoscopy or flexible sigmoidoscopy):  Excessive amounts of blood in the stool  Significant tenderness or worsening of abdominal pains  Swelling of the abdomen that is new, acute  Fever of 100F or higher  For urgent or emergent issues, a gastroenterologist can  be reached at any hour by calling [phone]. Do not use MyChart messaging for urgent concerns.    DIET:  We do recommend a small meal at first, but then you may proceed to your regular diet.  Drink plenty of fluids but you should avoid alcoholic beverages for 24 hours.  ACTIVITY:  You should plan to take it easy for the rest of today and you should NOT DRIVE or use heavy machinery until tomorrow (because of the sedation medicines used during the test).    FOLLOW UP: Our staff will call the number listed on your records 48-72 hours following your procedure to check on you and address any questions or concerns that you may have regarding the information given to you following your procedure. If we do not reach you, we will leave a message.  We will attempt to reach you two times.  During this call, we will ask if you have developed any symptoms of COVID 19. If you develop any symptoms (ie: fever, flu-like symptoms, shortness of breath, cough etc.) before then, please call [phone].  If you test positive for Covid 19 in the 2 weeks post procedure, please call and report this information to [place].    If any biopsies were taken you will be contacted by phone or by letter within the next 1-3 weeks.  Please call us at [phone] if you have not heard about the biopsies in 3 weeks.    SIGNATURES/CONFIDENTIALITY:  You and/or your care partner have signed paperwork which will be entered into your electronic medical record.  These signatures attest to the fact that that the information above on your After Visit Summary has been reviewed and is understood.  Full responsibility of the confidentiality of this discharge information lies with you and/or your care-partner.

## 2020-04-25 ENCOUNTER — Telehealth: Payer: Self-pay

## 2020-04-25 ENCOUNTER — Telehealth: Payer: Self-pay | Admitting: *Deleted

## 2020-04-25 NOTE — Telephone Encounter (Signed)
  Follow up Call-  Call back number 04/23/2020  Post procedure Call Back phone  # [phone]  Permission to leave phone message Yes  Some recent data might be hidden    LMOM to call back with any questions or concerns.  Also, call back if patient has developed fever, respiratory issues or been dx with COVID or had any family members or close contacts diagnosed since her procedure.

## 2020-04-25 NOTE — Telephone Encounter (Signed)
LVM

## 2020-04-28 DIAGNOSIS — B029 Zoster without complications: Secondary | ICD-10-CM | POA: Diagnosis not present

## 2020-05-02 ENCOUNTER — Encounter: Payer: Self-pay | Admitting: Gastroenterology

## 2020-05-06 ENCOUNTER — Encounter: Payer: Self-pay | Admitting: Family Medicine

## 2020-05-06 ENCOUNTER — Ambulatory Visit (INDEPENDENT_AMBULATORY_CARE_PROVIDER_SITE_OTHER): Payer: Medicare PPO | Admitting: Family Medicine

## 2020-05-06 ENCOUNTER — Other Ambulatory Visit: Payer: Self-pay

## 2020-05-06 VITALS — BP 128/66 | HR 68 | Temp 98.2°F | Ht 68.0 in | Wt 168.4 lb

## 2020-05-06 DIAGNOSIS — L570 Actinic keratosis: Secondary | ICD-10-CM

## 2020-05-06 DIAGNOSIS — L820 Inflamed seborrheic keratosis: Secondary | ICD-10-CM

## 2020-05-06 DIAGNOSIS — B0223 Postherpetic polyneuropathy: Secondary | ICD-10-CM | POA: Diagnosis not present

## 2020-05-06 MED ORDER — GABAPENTIN 100 MG PO CAPS
ORAL_CAPSULE | ORAL | 3 refills | Status: DC
Start: 2020-05-06 — End: 2020-07-22

## 2020-05-06 NOTE — Progress Notes (Signed)
   [name] is a 74 y.o. male who presents today for an office visit.  Assessment/Plan:  New/Acute Problems: Actinic Keratosis  Cryotherapy applied today.  See below procedure note.  Inflamed seborrheic keratoses Cryotherapy applied today.  See below procedure note.  He tolerated well.  Herpetic neuropathy No red flags.  Start gabapentin he will increase to 300 mg daily over the next few days.  He will follow-up with me or his PCP if not improving.    Subjective:  HPI:  Had colonscopy two weeks ago. A few days after starting having abdominal pain on left lower side.  Later started developing senstive skin to the area.  Ended up going to urgent care. They diagnosed him shingles and he was started on valtrex. He is still having quite a bit of pain.   He also has a spot on the scalp that he would like to have frozen.  Is been there for several months to years.       Objective:  Physical Exam: BP 128/66   Pulse 68   Temp 98.2 F (36.8 C)   Ht 5\' 8"  (1.727 m)   Wt 168 lb 6.1 oz (76.4 kg)   SpO2 96%   BMI 25.60 kg/m   Gen: No acute distress, resting comfortably CV: Regular rate and rhythm with no murmurs appreciated Pulm: Normal work of breathing, clear to auscultation bilaterally with no crackles, wheezes, or rhonchi  GI: Bowel sounds present, soft, nondistended, nontender Skin: 2 actinic keratosis noted on scalp.  Erythematous rash with no new vesicles on left lower back.  2 inflamed seborrheic keratosis noted on mid chest. Neuro: Grossly normal, moves all extremities Psych: Normal affect and thought content  Cryotherapy Procedure Note  Pre-operative Diagnosis: Actinic keratosis  Locations: Scalp  Indications: Therapeutic  Procedure Details  Patient informed of risks (permanent scarring, infection, light or dark discoloration, bleeding, infection, weakness, numbness and recurrence of the lesion) and benefits of the procedure and verbal informed consent  obtained.  The areas are treated with liquid nitrogen therapy, frozen until ice ball extended 3 mm beyond lesion, allowed to thaw, and treated again.  A total of 2 lesions were treated this way.  The patient tolerated procedure well.  The patient was instructed on post-op care, warned that there may be blister formation, redness and pain. Recommend OTC analgesia as needed for pain.  Condition: Stable  Complications: none.  Cryotherapy Procedure Note  Pre-operative Diagnosis: Inflamed seborrheic keratoses  Locations: Chest  Indications: Therapeutic  Procedure Details  Patient informed of risks (permanent scarring, infection, light or dark discoloration, bleeding, infection, weakness, numbness and recurrence of the lesion) and benefits of the procedure and verbal informed consent obtained.  The areas are treated with liquid nitrogen therapy, frozen until ice ball extended 3 mm beyond lesion, allowed to thaw, and treated again.  A total of 2 lesions were treated this way.  The patient tolerated procedure well.  The patient was instructed on post-op care, warned that there may be blister formation, redness and pain. Recommend OTC analgesia as needed for pain.  Condition: Stable  Complications: none.          Algis Greenhouse. Jerline Pain, MD 05/06/2020 1:56 PM

## 2020-05-06 NOTE — Patient Instructions (Signed)
It was very nice to see you today!  Please start the gabapentin.  Take 100 mg nightly and then increase to 300 mg daily.  We froze the spots on your scalp and chest today.  Please let me know or let Dr. Yong Channel know if your pain is not improving in the next week or so.  Take care, Dr Jerline Pain  Please try these tips to maintain a healthy lifestyle:   Eat at least 3 REAL meals and 1-2 snacks per day.  Aim for no more than 5 hours between eating.  If you eat breakfast, please do so within one hour of getting up.    Each meal should contain half fruits/vegetables, one quarter protein, and one quarter carbs (no bigger than a computer mouse)   Cut down on sweet beverages. This includes juice, soda, and sweet tea.     Drink at least 1 glass of water with each meal and aim for at least 8 glasses per day   Exercise at least 150 minutes every week.

## 2020-05-21 ENCOUNTER — Other Ambulatory Visit: Payer: Self-pay | Admitting: Family Medicine

## 2020-05-21 ENCOUNTER — Other Ambulatory Visit: Payer: Self-pay | Admitting: Internal Medicine

## 2020-05-27 DIAGNOSIS — E782 Mixed hyperlipidemia: Secondary | ICD-10-CM | POA: Diagnosis not present

## 2020-05-27 DIAGNOSIS — I25118 Atherosclerotic heart disease of native coronary artery with other forms of angina pectoris: Secondary | ICD-10-CM | POA: Diagnosis not present

## 2020-05-29 ENCOUNTER — Encounter: Payer: Self-pay | Admitting: Family Medicine

## 2020-05-30 ENCOUNTER — Other Ambulatory Visit: Payer: Self-pay

## 2020-05-30 MED ORDER — SILDENAFIL CITRATE 100 MG PO TABS
100.0000 mg | ORAL_TABLET | Freq: Every day | ORAL | 11 refills | Status: DC | PRN
Start: 2020-05-30 — End: 2020-08-01

## 2020-05-30 MED ORDER — INDOMETHACIN 25 MG PO CAPS: 25.0000 mg | ORAL_CAPSULE | Freq: Two times a day (BID) | ORAL | 0 refills | Status: DC | PRN

## 2020-05-31 ENCOUNTER — Telehealth: Payer: Self-pay | Admitting: Family Medicine

## 2020-05-31 NOTE — Telephone Encounter (Signed)
Patient is requesting to be worked in between 7/26-8/13 or 8/23-9/9 for CPE so results can be shared with cardiologist  Last CPE -07/04/2018  Please advise

## 2020-06-04 NOTE — Telephone Encounter (Signed)
See below

## 2020-06-04 NOTE — Telephone Encounter (Signed)
Appears he is scheduled on September 2-we can at least do lab work at that [place] write in the notes physical if possible.  We would need to focus our efforts primarily on the physical and blood work as I only have a 20-minute slot available in that timeframe he is requesting-he can go ahead and schedule next physical a year out at time of visit if hed like

## 2020-07-15 ENCOUNTER — Ambulatory Visit: Payer: Medicare PPO | Admitting: Family Medicine

## 2020-07-15 ENCOUNTER — Other Ambulatory Visit: Payer: Self-pay | Admitting: Family Medicine

## 2020-07-15 ENCOUNTER — Other Ambulatory Visit: Payer: Self-pay

## 2020-07-15 ENCOUNTER — Encounter: Payer: Self-pay | Admitting: Family Medicine

## 2020-07-15 VITALS — BP 120/60 | HR 68 | Temp 98.4°F | Ht 68.0 in | Wt 169.0 lb

## 2020-07-15 DIAGNOSIS — H60502 Unspecified acute noninfective otitis externa, left ear: Secondary | ICD-10-CM

## 2020-07-15 MED ORDER — CIPROFLOXACIN-DEXAMETHASONE 0.3-0.1 % OT SUSP
4.0000 [drp] | Freq: Two times a day (BID) | OTIC | 1 refills | Status: DC
Start: 2020-07-15 — End: 2020-08-01

## 2020-07-15 NOTE — Patient Instructions (Addendum)
Otitis Externa  Otitis externa is an infection of the outer ear canal. The outer ear canal is the area between the outside of the ear and the eardrum. Otitis externa is sometimes called swimmer's ear. What are the causes? Common causes of this condition include:  Swimming in dirty water.  Moisture in the ear.  An injury to the inside of the ear.  An object stuck in the ear.  A cut or scrape on the outside of the ear. What increases the risk? You are more likely to develop this condition if you go swimming often. What are the signs or symptoms? The first symptom of this condition is often itching in the ear. Later symptoms of the condition include:  Swelling of the ear.  Redness in the ear.  Ear pain. The pain may get worse when you pull on your ear.  Pus coming from the ear. How is this diagnosed? This condition may be diagnosed by examining the ear and testing fluid from the ear for bacteria and funguses. How is this treated? This condition may be treated with:  Antibiotic ear drops. These are often given for 10-14 days.  Medicines to reduce itching and swelling. Follow these instructions at home:  If you were prescribed antibiotic ear drops, use them as told by your health care provider. Do not stop using the antibiotic even if your condition improves.  Take over-the-counter and prescription medicines only as told by your health care provider.  Avoid getting water in your ears as told by your health care provider. This may include avoiding swimming or water sports for a few days.  Keep all follow-up visits as told by your health care provider. This is important. How is this prevented?  Keep your ears dry. Use the corner of a towel to dry your ears after you swim or bathe.  Avoid scratching or putting things in your ear. Doing these things can damage the ear canal or remove the protective wax that lines it, which makes it easier for bacteria and funguses to  grow.  Avoid swimming in lakes, polluted water, or pools that may not have enough chlorine. Contact a health care provider if:  You have a fever.  Your ear is still red, swollen, painful, or draining pus after 3 days.  Your redness, swelling, or pain gets worse.  You have a severe headache.  You have redness, swelling, pain, or tenderness in the area behind your ear. Summary  Otitis externa is an infection of the outer ear canal.  Common causes include swimming in dirty water, moisture in the ear, or a cut or scrape in the ear.  Symptoms include pain, redness, and swelling of the ear.  If you were prescribed antibiotic ear drops, use them as told by your health care provider. Do not stop using the antibiotic even if your condition improves. This information is not intended to replace advice given to you by your health care provider. Make sure you discuss any questions you have with your health care provider. Document Revised: 04/22/2018 Document Reviewed: 04/22/2018 [employer] Patient Education  2020 [employer]  

## 2020-07-15 NOTE — Progress Notes (Signed)
Established Patient Office Visit  Subjective:  Patient ID: [name], male    DOB: 13-Apr-1946  Age: 74 y.o. MRN: [id number]  CC:  Chief Complaint  Patient presents with  . Ear Pain    HPI [name] presents for left earache started 3 to 4 days ago.  [place] of swimming between the ocean and pool.  He also does regular lap swimming.  He has had some moderate pain involving the left ear but not the right.  No sore throat.  No fevers or chills.  No ear drainage.  No acute hearing changes.  Denies any cough or other upper respiratory symptoms.  Past Medical History:  Diagnosis Date  . Abscess    right posterior neck  . Anticoagulation adequate 06/07/2019   Overview:  SSS, paroxAFib/Flutter, CHADSvasc= 3 age, h/o cva; chronic OAC Xarelto  . Atrial fibrillation and flutter ([place]) 01/01/2015   Noted 2016. Xarelto   . BPH associated with nocturia 08/14/2014   Finasteride, flomax.  0-3x nocturia. PSA to 14-->eval urology [facility]. PSA trended back down to about 2.6 per records in 2015 then placed on finasteride- had significant voiding issues when spiked to 14- improved on meds   . Bradycardia   . CAD (coronary artery disease) 05/26/2016   Coronary CTA_ 50-75% D1 off CT report   . Cardiac pacemaker in situ 12/28/2009   SA node dysfunction as cause   . Chest pressure 12/01/2015   Overview:  atypical CP, [facility] nuclear perfusion imaging w/ no inarct, no ischemia, EF-40% NICM  . Chronic systolic heart failure ([place]) 12/01/2015   Overview:  [facility] nuclear perfusion imaging study, no inarct, no ischemia, EF-40% NICM  . CVA (cerebral vascular accident) ([place])   . Double vision 07/04/2018  . Elevated LFTs   . Erectile dysfunction 01/30/2015   cialis prn   . External hemorrhoids   . Fitting or adjustment of cardiac pacemaker 06/07/2019   Overview:  [facility] [id number], RA and RV leads [id number]:  [id number] and [id number]  . Former smoker 01/30/2015    Former smoker. 20 pack years quit 92. Patient declined AAA screen.    Marland Kitchen GERD (gastroesophageal reflux disease)   . Gout 11/11/2009   Qualifier: Diagnosis of  By: Lynden Ang    . History of nuclear stress test 06/07/2019   Overview:  [facility], no inarct, no ischemia, EF-40% NICM  . HTN (hypertension)   . Hyperglycemia 06/10/2017   110s CBG 2018  . Hyperlipidemia 07/12/2007   Crestor 20mg  daily with some myalgias; fish oil. Trig 200-500 despite this. LDL ok.       . Insomnia 01/30/2015   ambien 5mg  prn. May refill.    . Ischemic optic neuropathy   . Left chest pressure 03/19/2015  . Other and unspecified hyperlipidemia   . Sinoatrial node dysfunction (HCC)   . Spinal stenosis   . Trigger finger, right middle finger 03/01/2018    Past Surgical History:  Procedure Laterality Date  . ABSCESS DRAINAGE     righ tposterior neck  . CARDIAC CATHETERIZATION  1996  . CHOLECYSTECTOMY  02/07/2003  . COLONOSCOPY  06/23/2004  . EYE SURGERY     left eye  . left ingunial hernia  06/08/2011  . PACEMAKER INSERTION    . right hip replacement     2012 Dr. Maureen Ralphs    Family History  Problem Relation Age of Onset  . Lung cancer  Mother        mets to brain  . Heart attack Mother 5  . Hypertension Mother   . Brain cancer Mother   . Heart attack Father        mid 26s, smoker.   . Diabetes Father   . Diabetes Paternal Grandfather   . Healthy Brother   . Colon cancer Neg Hx   . Esophageal cancer Neg Hx   . Stomach cancer Neg Hx   . Rectal cancer Neg Hx     Social History   Socioeconomic History  . Marital status: Married    Spouse name: Not on file  . Number of children: 1  . Years of education: 56  . Highest education level: Not on file  Occupational History  . Not on file  Tobacco Use  . Smoking status: Former Smoker    Packs/day: 1.00    Years: 20.00    Pack years: 20.00    Types: Cigarettes    Quit date: 11/04/1991    Years since quitting: 28.7  . Smokeless tobacco: Never  Used  Vaping Use  . Vaping Use: Never used  Substance and Sexual Activity  . Alcohol use: Yes    Alcohol/week: 0.0 standard drinks    Comment: occ  . Drug use: No  . Sexual activity: Not on file  Other Topics Concern  . Not on file  Social History Narrative   Married. 1 child and 1 step child. 2 grandchildren.       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 Difficulty of Paying Living Expenses:   Food Insecurity:   . Worried About Charity fundraiser in the Last Year:   . Arboriculturist in the Last Year:   Transportation Needs:   . Film/video editor (Medical):   Marland Kitchen Lack of Transportation (Non-Medical):   Physical Activity:   . Days of Exercise per Week:   . Minutes of Exercise per Session:   Stress:   . Feeling of Stress :   Social Connections:   . Frequency of Communication with Friends and Family:   . Frequency of Social Gatherings with Friends and Family:   . Attends Religious Services:   . Active Member of Clubs or Organizations:   . Attends Archivist Meetings:   Marland Kitchen Marital Status:   Intimate Partner Violence:   . Fear of Current or Ex-Partner:   . Emotionally Abused:   Marland Kitchen Physically Abused:   . Sexually Abused:     Outpatient Medications Prior to Visit  Medication Sig Dispense Refill  . benazepril (LOTENSIN) 5 MG tablet Take 1 tablet by mouth once daily 90 tablet 0  . cyclobenzaprine (FLEXERIL) 5 MG tablet Take 1 tablet (5 mg total) by mouth 3 (three) times daily as needed for muscle spasms. 30 tablet 2  . finasteride (PROSCAR) 5 MG tablet Take 1 tablet by mouth once daily 90 tablet 0  . fish oil-omega-3 fatty acids 1000 MG capsule Take 2 g by mouth daily. Reported on 02/18/2016    . gabapentin (NEURONTIN) 100 MG capsule Take 100mg  nightly for 3 days, then increase to 300mg  nightly. 90 capsule 3  . gemfibrozil  (LOPID) 600 MG tablet TAKE 1 TABLET BY MOUTH TWICE DAILY BEFORE A MEAL. Please make yearly  appt with Dr. Lovena Le for August before anymore refills. 1st attempt 180 tablet 0  . indomethacin (INDOCIN) 25 MG capsule Take 1 capsule (25 mg total) by mouth 2 (two) times daily as needed (pain). Take with a meal. 3 days maximun 30 capsule 0  . metoprolol succinate (TOPROL-XL) 25 MG 24 hr tablet Take 1 tablet by mouth once daily 90 tablet 0  . Multiple Vitamin (MULTIVITAMIN) tablet Take 1 tablet by mouth daily.      Marland Kitchen omeprazole (PRILOSEC) 20 MG capsule Take 1 capsule by mouth once daily 90 capsule 0  . rosuvastatin (CRESTOR) 40 MG tablet Take 1 tablet (40 mg total) by mouth daily. 90 tablet 3  . sildenafil (VIAGRA) 100 MG tablet Take 1 tablet (100 mg total) by mouth daily as needed for erectile dysfunction. 10 tablet 11  . XARELTO 20 MG TABS tablet TAKE 1 TABLET BY MOUTH ONCE DAILY WITH SUPPER 90 tablet 2  . zolpidem (AMBIEN) 5 MG tablet TAKE 1 TABLET BY MOUTH AT BEDTIME 90 tablet 0   No facility-administered medications prior to visit.    No Known Allergies  ROS Review of Systems  Constitutional: Negative for chills and fever.  HENT: Positive for ear pain. Negative for congestion, ear discharge and hearing loss.   Respiratory: Negative for cough and shortness of breath.       Objective:    Physical Exam Vitals reviewed.  Constitutional:      Appearance: Normal appearance.  HENT:     Ears:     Comments: Right eardrum and ear canal are normal.  His left canal is diffusely swollen with mild erythema.  Tender to palpation.  Left eardrum is not fully visualized secondary to canal edema Cardiovascular:     Rate and Rhythm: Normal rate and regular rhythm.  Pulmonary:     Effort: Pulmonary effort is normal.     Breath sounds: Normal breath sounds.  Musculoskeletal:     Cervical back: Neck supple.  Lymphadenopathy:     Cervical: No cervical adenopathy.  Neurological:     Mental Status: He is  alert.     BP 120/60 Comment: Faint  Pulse 68   Temp 98.4 F (36.9 C) (Oral)   Ht 5\' 8"  (1.727 m)   Wt 169 lb (76.7 kg)   SpO2 96%   BMI 25.70 kg/m  Wt Readings from Last 3 Encounters:  07/15/20 169 lb (76.7 kg)  05/06/20 168 lb 6.1 oz (76.4 kg)  04/23/20 165 lb (74.8 kg)     Health Maintenance Due  Topic Date Due  . COVID-19 Vaccine (1) Never done  . TETANUS/TDAP  10/04/2018  . INFLUENZA VACCINE  06/30/2020    There are no preventive care reminders to display for this patient.  Lab Results  Component Value Date   TSH 3.84 04/19/2019   Lab Results  Component Value Date   WBC 7.4 11/16/2019   HGB 14.1 11/16/2019   HCT 42.0 11/16/2019   MCV 88.4 11/16/2019   PLT 282.0 11/16/2019   Lab Results  Component Value Date   NA 140 11/16/2019   K 4.4 11/16/2019   CO2 29 11/16/2019   GLUCOSE 109 (H) 11/16/2019   BUN 20 11/16/2019   CREATININE 1.16 11/16/2019   BILITOT 0.7 11/16/2019   ALKPHOS 78 11/16/2019   AST 19 11/16/2019   ALT 19 11/16/2019   PROT 6.8 11/16/2019   ALBUMIN 4.5 11/16/2019   CALCIUM 9.6 11/16/2019   GFR 61.70 11/16/2019   Lab  Results  Component Value Date   CHOL 134 11/16/2019   Lab Results  Component Value Date   HDL 35.40 (L) 11/16/2019   Lab Results  Component Value Date   LDLCALC 95 07/04/2018   Lab Results  Component Value Date   TRIG 243.0 (H) 11/16/2019   Lab Results  Component Value Date   CHOLHDL 4 11/16/2019   Lab Results  Component Value Date   HGBA1C 5.9 04/19/2019      Assessment & Plan:   Acute left otitis externa  -Keep canal is dry as possible -Ciprodex otic suspension 4 drops left ear twice daily -Follow-up with primary if not improving by next week  Meds ordered this encounter  Medications  . ciprofloxacin-dexamethasone (CIPRODEX) OTIC suspension    Sig: Place 4 drops into the left ear 2 (two) times daily.    Dispense:  7.5 mL    Refill:  1    Follow-up: No follow-ups on file.    Carolann Littler, MD

## 2020-07-15 NOTE — Telephone Encounter (Signed)
Pt requesting refill

## 2020-07-18 ENCOUNTER — Other Ambulatory Visit: Payer: Self-pay | Admitting: Family Medicine

## 2020-07-22 ENCOUNTER — Other Ambulatory Visit: Payer: Self-pay

## 2020-07-22 ENCOUNTER — Ambulatory Visit: Payer: Medicare PPO | Admitting: Internal Medicine

## 2020-07-22 VITALS — BP 114/60 | HR 76 | Ht 68.0 in | Wt 169.0 lb

## 2020-07-22 DIAGNOSIS — E785 Hyperlipidemia, unspecified: Secondary | ICD-10-CM | POA: Diagnosis not present

## 2020-07-22 DIAGNOSIS — Z950 Presence of cardiac pacemaker: Secondary | ICD-10-CM | POA: Diagnosis not present

## 2020-07-22 DIAGNOSIS — I480 Paroxysmal atrial fibrillation: Secondary | ICD-10-CM

## 2020-07-22 DIAGNOSIS — I2510 Atherosclerotic heart disease of native coronary artery without angina pectoris: Secondary | ICD-10-CM

## 2020-07-22 DIAGNOSIS — I495 Sick sinus syndrome: Secondary | ICD-10-CM

## 2020-07-22 NOTE — Patient Instructions (Signed)
Medication Instructions:  Your physician recommends that you continue on your current medications as directed. Please refer to the Current Medication list given to you today.  Labwork: None ordered.  Testing/Procedures: None ordered.  Follow-Up:  Your physician wants you to follow-up in: 6 months with device clinic for a pacemaker check.   You will receive a reminder letter in the mail two months in advance. If you don't receive a letter, please call our office to schedule the follow-up appointment.  Your physician wants you to follow-up in: one year with Dr. Lovena Le.   You will receive a reminder letter in the mail two months in advance. If you don't receive a letter, please call our office to schedule the follow-up appointment.  Any Other Special Instructions Will Be Listed Below (If Applicable).  If you need a refill on your cardiac medications before your next appointment, please call your pharmacy.

## 2020-07-22 NOTE — Progress Notes (Signed)
HPI [name] returns today for followup. He is a pleasant 74 yo man with sinus node dysfunction, s/p PPM insertion. He has non-obstructive CAD. He has dyslipidemia and is on statin therapy. In the interim, he notes he feels better than ever.  He is exercising regularly, swimming.  He notes that his times are better and his energy level is improved.  He denies palpitations. No Known Allergies   Current Outpatient Medications  Medication Sig Dispense Refill  . benazepril (LOTENSIN) 5 MG tablet Take 1 tablet by mouth once daily 90 tablet 0  . ciprofloxacin-dexamethasone (CIPRODEX) OTIC suspension Place 4 drops into the left ear 2 (two) times daily. 7.5 mL 1  . cyclobenzaprine (FLEXERIL) 5 MG tablet Take 1 tablet (5 mg total) by mouth 3 (three) times daily as needed for muscle spasms. 30 tablet 2  . finasteride (PROSCAR) 5 MG tablet Take 1 tablet by mouth once daily 90 tablet 0  . gemfibrozil (LOPID) 600 MG tablet TAKE 1 TABLET BY MOUTH TWICE DAILY BEFORE A MEAL. Please make yearly appt with Dr. Lovena Le for August before anymore refills. 1st attempt 180 tablet 0  . indomethacin (INDOCIN) 25 MG capsule Take 1 capsule (25 mg total) by mouth 2 (two) times daily as needed (pain). Take with a meal. 3 days maximun 30 capsule 0  . metoprolol succinate (TOPROL-XL) 25 MG 24 hr tablet Take 1 tablet by mouth once daily 90 tablet 0  . Multiple Vitamin (MULTIVITAMIN) tablet Take 1 tablet by mouth daily.      Marland Kitchen omeprazole (PRILOSEC) 20 MG capsule Take 1 capsule by mouth once daily 90 capsule 0  . rosuvastatin (CRESTOR) 40 MG tablet Take 1 tablet (40 mg total) by mouth daily. 90 tablet 3  . sildenafil (VIAGRA) 100 MG tablet Take 1 tablet (100 mg total) by mouth daily as needed for erectile dysfunction. 10 tablet 11  . XARELTO 20 MG TABS tablet TAKE 1 TABLET BY MOUTH ONCE DAILY WITH SUPPER 90 tablet 2  . zolpidem (AMBIEN) 5 MG tablet TAKE 1 TABLET BY MOUTH AT BEDTIME 90 tablet 0   No current  facility-administered medications for this visit.     Past Medical History:  Diagnosis Date  . Abscess    right posterior neck  . Anticoagulation adequate 06/07/2019   Overview:  SSS, paroxAFib/Flutter, CHADSvasc= 3 age, h/o cva; chronic OAC Xarelto  . Atrial fibrillation and flutter ([place]) 01/01/2015   Noted 2016. Xarelto   . BPH associated with nocturia 08/14/2014   Finasteride, flomax.  0-3x nocturia. PSA to 14-->eval urology [facility]. PSA trended back down to about 2.6 per records in 2015 then placed on finasteride- had significant voiding issues when spiked to 14- improved on meds   . Bradycardia   . CAD (coronary artery disease) 05/26/2016   Coronary CTA_ 50-75% D1 off CT report   . Cardiac pacemaker in situ 12/28/2009   SA node dysfunction as cause   . Chest pressure 12/01/2015   Overview:  atypical CP, [facility] nuclear perfusion imaging w/ no inarct, no ischemia, EF-40% NICM  . Chronic systolic heart failure ([place]) 12/01/2015   Overview:  [facility] nuclear perfusion imaging study, no inarct, no ischemia, EF-40% NICM  . CVA (cerebral vascular accident) ([place])   . Double vision 07/04/2018  . Elevated LFTs   . Erectile dysfunction 01/30/2015   cialis prn   . External hemorrhoids   . Fitting or adjustment of cardiac pacemaker 06/07/2019   Overview:  [employer] Evia  DR [id number], RA and RV leads C540346:  [id number]  . Former smoker 01/30/2015   Former smoker. 20 pack years quit 92. Patient declined AAA screen.    Marland Kitchen GERD (gastroesophageal reflux disease)   . Gout 11/11/2009   Qualifier: Diagnosis of  By: Lynden Ang    . History of nuclear stress test 06/07/2019   Overview:  [facility], no inarct, no ischemia, EF-40% NICM  . HTN (hypertension)   . Hyperglycemia 06/10/2017   110s CBG 2018  . Hyperlipidemia 07/12/2007   Crestor 20mg  daily with some myalgias; fish oil. Trig 200-500 despite this. LDL ok.       . Insomnia 01/30/2015   ambien 5mg  prn. May refill.    . Ischemic optic neuropathy   . Left  chest pressure 03/19/2015  . Other and unspecified hyperlipidemia   . Sinoatrial node dysfunction (HCC)   . Spinal stenosis   . Trigger finger, right middle finger 03/01/2018    ROS:   All systems reviewed and negative except as noted in the HPI.   Past Surgical History:  Procedure Laterality Date  . ABSCESS DRAINAGE     righ tposterior neck  . CARDIAC CATHETERIZATION  1996  . CHOLECYSTECTOMY  02/07/2003  . COLONOSCOPY  06/23/2004  . EYE SURGERY     left eye  . left ingunial hernia  06/08/2011  . PACEMAKER INSERTION    . right hip replacement     2012 Dr. Maureen Ralphs     Family History  Problem Relation Age of Onset  . Lung cancer Mother        mets to brain  . Heart attack Mother 36  . Hypertension Mother   . Brain cancer Mother   . Heart attack Father        mid 45s, smoker.   . Diabetes Father   . Diabetes Paternal Grandfather   . Healthy Brother   . Colon cancer Neg Hx   . Esophageal cancer Neg Hx   . Stomach cancer Neg Hx   . Rectal cancer Neg Hx      Social History   Socioeconomic History  . Marital status: Married    Spouse name: Not on file  . Number of children: 1  . Years of education: 32  . Highest education level: Not on file  Occupational History  . Not on file  Tobacco Use  . Smoking status: Former Smoker    Packs/day: 1.00    Years: 20.00    Pack years: 20.00    Types: Cigarettes    Quit date: 11/04/1991    Years since quitting: 28.7  . Smokeless tobacco: Never Used  Vaping Use  . Vaping Use: Never used  Substance and Sexual Activity  . Alcohol use: Yes    Alcohol/week: 0.0 standard drinks    Comment: occ  . Drug use: No  . Sexual activity: Not on file  Other Topics Concern  . Not on file  Social History Narrative   Married. 1 child and 1 step child. 2 grandchildren.       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 Difficulty of Paying Living Expenses: Not on file  Food Insecurity:   . Worried About Charity fundraiser in the Last Year: Not on file  . Ran  Out of Food in the Last Year: Not on file  Transportation Needs:   . Lack of Transportation (Medical): Not on file  . Lack of Transportation (Non-Medical): Not on file  Physical Activity:   . Days of Exercise per Week: Not on file  . Minutes of Exercise per Session: Not on file  Stress:   . Feeling of Stress : Not on file  Social Connections:   . Frequency of Communication with Friends and Family: Not on file  . Frequency of Social Gatherings with Friends and Family: Not on file  . Attends Religious Services: Not on file  . Active Member of Clubs or Organizations: Not on file  . Attends Archivist Meetings: Not on file  . Marital Status: Not on file  Intimate Partner Violence:   . Fear of Current or Ex-Partner: Not on file  . Emotionally Abused: Not on file  . Physically Abused: Not on file  . Sexually Abused: Not on file     BP 114/60   Pulse 76   Ht 5\' 8"  (1.727 m)   Wt 169 lb (76.7 kg)   SpO2 96%   BMI 25.70 kg/m   Physical Exam:  Well appearing 74 year old man, NAD HEENT: Unremarkable Neck:  No JVD, no thyromegally Lymphatics:  No adenopathy Back:  No CVA tenderness Lungs:  Clear, with no wheezes, rales, or rhonchi HEART:  Regular rate rhythm, no murmurs, no rubs, no clicks Abd:  soft, positive bowel sounds, no organomegally, no rebound, no guarding Ext:  2 plus pulses, no edema, no cyanosis, no clubbing Skin:  No rashes no nodules Neuro:  CN II through XII intact, motor grossly intact  EKG -normal sinus rhythm with atrial pacing  DEVICE  Normal device function.  See PaceArt for details.   Assess/Plan: 1.  Sinus node dysfunction -he has no underlying escape rhythm today.  He is stable status post pacemaker insertion. 2.  Pacemaker -his Biotronik dual-chamber pacemaker is working  normally.  His histograms look good.  He will be rechecked in several months. 3.  Coronary artery disease -he denies anginal symptoms.  He has nonobstructive disease except for some branch vessel disease for which she is asymptomatic despite exercising vigorously.  He will undergo watchful waiting.  No change in his medications. 4.  Dyslipidemia -he has a preponderance of hypertriglyceridemia.  He is on multiple medications which he will continue.   Nathan Peru, MD

## 2020-07-24 ENCOUNTER — Encounter: Payer: Self-pay | Admitting: Family Medicine

## 2020-07-25 MED ORDER — TADALAFIL 5 MG PO TABS: 5.0000 mg | ORAL_TABLET | Freq: Every day | ORAL | 3 refills | Status: DC | PRN

## 2020-07-31 ENCOUNTER — Other Ambulatory Visit: Payer: Self-pay | Admitting: Family Medicine

## 2020-07-31 NOTE — Patient Instructions (Addendum)
Please stop by lab before you go If you have mychart- we will send your results within 3 business days of [place] receiving them.  If you do not have mychart- we will call you about results within 5 business days of [place] receiving them.  *please note we are currently using [facility] which has a longer processing time than [facility] typically so labs may not come back as quickly as in the past *please also note that you will see labs on mychart as soon as they post. I will later go in and write notes on them- will say "notes from Dr. Yong Channel"  Health Maintenance Due  Topic Date Due  . COVID-19 Vaccine (1) please send my chart with dates  Never done  . TETANUS/TDAP Will get at pharmacy and call our office and let us know dates  10/04/2018  . INFLUENZA VACCINE - Patient declined if you get later in the year at [facility] let our office know  06/30/2020   Please check with your pharmacy to see if they have the shingrix vaccine. If they do- please get this immunization and update [place] by phone call or mychart with dates you receive the vaccine  Encouraged him to follow up with Dr. Delman Cheadle or [facility] as has nonhealing wound on right ear id like for them to evaluate.  Team please see if labs can be added to this AM- if not hes willing to redraw  Please refill all meds for 1 year that I prescribe for 90 days with 3 refills   Cryosurgery for Skin Conditions, Care After These instructions give you information on caring for yourself after your procedure. Your doctor may also give you more specific instructions. Call your doctor if you have any problems or questions after your procedure. Follow these instructions at home: Caring for the treated area   Follow instructions from your doctor about how to take care of the treated area. Make sure you: ? Keep the area covered with a bandage (dressing) until it heals, or for as long as told by your doctor. ? Wash your hands with soap and water before you change your  bandage. If you do not have soap and water, use hand sanitizer. ? Change your bandage as told by your doctor. ? Keep the bandage and the treated area clean and dry. If the bandage gets wet, change it right away. ? Clean the treated area with soap and water.  Check the treated area every day for signs of infection. Check for: ? More redness, swelling, or pain. ? More fluid or blood. ? Warmth. ? Pus or a bad smell. General instructions  Do not pick at your blister. Do not try to break it open. This can cause infection and scarring.  Do not put any medicine, cream, or lotion on the treated area unless told by your doctor.  Take over-the-counter and prescription medicines only as told by your doctor.  Keep all follow-up visits as told by your doctor. This is important. Contact a doctor if:  You have more redness, swelling, or pain around the treated area.  You have more fluid or blood coming from the treated area.  The treated area feels warm to the touch.  You have pus or a bad smell coming from the treated area.  Your blister gets large and painful. Get help right away if:  You have a fever and have redness spreading from the treated area. Summary  You should keep the treated area and your  bandage clean and dry.  Check the treated area every day for signs of infection. Signs include fluid, pus, warmth, or having more redness, swelling, or pain.  Do not pick at your blister. Do not try to break it open. This information is not intended to replace advice given to you by your health care provider. Make sure you discuss any questions you have with your health care provider. Document Revised: 10/29/2017 Document Reviewed: 10/05/2016 Elsevier Patient Education  2020 [employer].

## 2020-07-31 NOTE — Progress Notes (Addendum)
Phone: [phone]   Subjective:  Patient presents today for their annual physical. Chief complaint-noted.   See problem oriented charting- Review of Systems  Constitutional: Negative for chills and fever.  HENT: Positive for hearing loss and tinnitus.        History of both hearing loss and ringing in ears followed by ENT   Eyes: Negative for blurred vision and double vision.  Respiratory: Negative for cough, shortness of breath and wheezing.   Cardiovascular: Negative for chest pain, palpitations and leg swelling.  Gastrointestinal: Positive for heartburn. Negative for nausea and vomiting.       Medications help with symptoms.   Genitourinary: Negative for dysuria, frequency and urgency.  Musculoskeletal: Negative for back pain, joint pain and neck pain.  Skin: Negative for rash.  Neurological: Negative for dizziness, seizures and headaches.  Endo/Heme/Allergies: Does not bruise/bleed easily.  Psychiatric/Behavioral: Negative for depression, memory loss and suicidal ideas. The patient does not have insomnia.    The following were reviewed and entered/updated in epic: Past Medical History:  Diagnosis Date  . Abscess    right posterior neck  . Anticoagulation adequate 06/07/2019   Overview:  SSS, paroxAFib/Flutter, CHADSvasc= 3 age, h/o cva; chronic OAC Xarelto  . Atrial fibrillation and flutter ([place]) 01/01/2015   Noted 2016. Xarelto   . BPH associated with nocturia 08/14/2014   Finasteride, flomax.  0-3x nocturia. PSA to 14-->eval urology [facility]. PSA trended back down to about 2.6 per records in 2015 then placed on finasteride- had significant voiding issues when spiked to 14- improved on meds   . Bradycardia   . CAD (coronary artery disease) 05/26/2016   Coronary CTA_ 50-75% D1 off CT report   . Cardiac pacemaker in situ 12/28/2009   SA node dysfunction as cause   . Chest pressure 12/01/2015   Overview:  atypical CP, [facility] nuclear perfusion imaging w/ no inarct, no ischemia, EF-40% NICM   . Chronic systolic heart failure ([place]) 12/01/2015   Overview:  [facility] nuclear perfusion imaging study, no inarct, no ischemia, EF-40% NICM  . CVA (cerebral vascular accident) ([place])   . Double vision 07/04/2018  . Elevated LFTs   . Erectile dysfunction 01/30/2015   cialis prn   . External hemorrhoids   . Fitting or adjustment of cardiac pacemaker 06/07/2019   Overview:  [place] [id number], RA and RV leads [id number]:  [id number] and [id number]  . Former smoker 01/30/2015   Former smoker. 20 pack years quit 92. Patient declined AAA screen.    Marland Kitchen GERD (gastroesophageal reflux disease)   . Gout 11/11/2009   Qualifier: Diagnosis of  By: Lynden Ang    . History of nuclear stress test 06/07/2019   Overview:  [facility], no inarct, no ischemia, EF-40% NICM  . HTN (hypertension)   . Hyperglycemia 06/10/2017   110s CBG 2018  . Hyperlipidemia 07/12/2007   Crestor 20mg  daily with some myalgias; fish oil. Trig 200-500 despite this. LDL ok.       . Insomnia 01/30/2015   ambien 5mg  prn. May refill.    . Ischemic optic neuropathy   . Left chest pressure 03/19/2015  . Other and unspecified hyperlipidemia   . Sinoatrial node dysfunction (HCC)   . Spinal stenosis   . Trigger finger, right middle finger 03/01/2018   Patient Active Problem List   Diagnosis Date Noted  . S/P cardiac cath 06/07/2019    Priority: High  . CAD (coronary artery disease) 05/26/2016    Priority: High  . Nonischemic cardiomyopathy (  [place]) 04/23/2016    Priority: High  . Chronic systolic heart failure ([place]) 12/01/2015    Priority: High  . CVA (cerebral infarction) 01/30/2015    Priority: High  . Atrial fibrillation and flutter ([place]) 01/01/2015    Priority: High  . Ventricular tachycardia, non-sustained ([place]) 01/25/2013    Priority: High  . Cardiac pacemaker in situ 12/28/2009    Priority: High  . Aortic atherosclerosis ([place]) 08/01/2020    Priority: Medium  . Fatty liver 08/01/2020    Priority: Medium  . Double vision 07/04/2018     Priority: Medium  . Hyperglycemia 06/10/2017    Priority: Medium  . Insomnia 01/30/2015    Priority: Medium  . BPH associated with nocturia 08/14/2014    Priority: Medium  . Gout 11/11/2009    Priority: Medium  . Hyperlipidemia 07/12/2007    Priority: Medium  . NEUROPATHY, ISCHEMIC OPTIC 06/02/2007    Priority: Medium  . Essential hypertension 06/02/2007    Priority: Medium  . Anticoagulation adequate 06/07/2019    Priority: Low  . Convergence insufficiency 06/07/2019    Priority: Low  . Fitting or adjustment of cardiac pacemaker 06/07/2019    Priority: Low  . Pacemaker-dependent due to native cardiac rhythm insufficient to support life 06/07/2019    Priority: Low  . Palpitations 06/07/2019    Priority: Low  . History of nuclear stress test 06/07/2019    Priority: Low  . Non-occlusive coronary artery disease 06/07/2019    Priority: Low  . Trigger finger, right middle finger 03/01/2018    Priority: Low  . Solitary pulmonary nodule 05/26/2016    Priority: Low  . Chest pressure 12/01/2015    Priority: Low  . Left chest pressure 03/19/2015    Priority: Low  . Erectile dysfunction 01/30/2015    Priority: Low  . Former smoker 01/30/2015    Priority: Low  . Allergic rhinitis 10/11/2014    Priority: Low  . Sinoatrial node dysfunction (HCC)     Priority: Low  . SPINAL STENOSIS, LUMBAR 08/22/2010    Priority: Low  . GERD 03/17/2010    Priority: Low  . Paroxysmal atrial fibrillation ([place]) 07/27/2019   Past Surgical History:  Procedure Laterality Date  . ABSCESS DRAINAGE     righ tposterior neck  . CARDIAC CATHETERIZATION  1996  . CHOLECYSTECTOMY  02/07/2003  . COLONOSCOPY  06/23/2004  . EYE SURGERY     left eye  . left ingunial hernia  06/08/2011  . PACEMAKER INSERTION    . right hip replacement     2012 Dr. Maureen Ralphs    Family History  Problem Relation Age of Onset  . Lung cancer Mother        mets to brain  . Heart attack Mother 42  . Hypertension Mother    . Brain cancer Mother   . Heart attack Father        mid 21s, smoker.   . Diabetes Father   . Cancer Brother   . Diabetes Paternal Grandfather   . Healthy Brother   . Colon cancer Neg Hx   . Esophageal cancer Neg Hx   . Stomach cancer Neg Hx   . Rectal cancer Neg Hx     Medications- reviewed and updated Current Outpatient Medications  Medication Sig Dispense Refill  . benazepril (LOTENSIN) 5 MG tablet Take 1 tablet by mouth once daily 90 tablet 0  . cyclobenzaprine (FLEXERIL) 5 MG tablet Take 1 tablet (5 mg total) by mouth 3 (three) times daily  as needed for muscle spasms. 30 tablet 2  . finasteride (PROSCAR) 5 MG tablet Take 1 tablet by mouth once daily 90 tablet 0  . gemfibrozil (LOPID) 600 MG tablet TAKE 1 TABLET BY MOUTH TWICE DAILY BEFORE A MEAL. Please make yearly appt with Dr. Lovena Le for August before anymore refills. 1st attempt 180 tablet 0  . indomethacin (INDOCIN) 25 MG capsule Take 1 capsule (25 mg total) by mouth 2 (two) times daily as needed (pain). Take with a meal. 3 days maximun 30 capsule 0  . metoprolol succinate (TOPROL-XL) 25 MG 24 hr tablet Take 1 tablet by mouth once daily 90 tablet 0  . Multiple Vitamin (MULTIVITAMIN) tablet Take 1 tablet by mouth daily.      Marland Kitchen omeprazole (PRILOSEC) 20 MG capsule Take 1 capsule by mouth once daily 90 capsule 0  . rosuvastatin (CRESTOR) 40 MG tablet Take 1 tablet by mouth once daily 90 tablet 0  . sildenafil (VIAGRA) 100 MG tablet Take 1 tablet (100 mg total) by mouth daily as needed for erectile dysfunction. 10 tablet 11  . tadalafil (CIALIS) 5 MG tablet Take 1 tablet (5 mg total) by mouth daily as needed for erectile dysfunction. 90 tablet 3  . XARELTO 20 MG TABS tablet TAKE 1 TABLET BY MOUTH ONCE DAILY WITH SUPPER 90 tablet 2  . zolpidem (AMBIEN) 5 MG tablet TAKE 1 TABLET BY MOUTH AT BEDTIME 90 tablet 0   No current facility-administered medications for this visit.    Allergies-reviewed and updated No Known  Allergies  Social History   Social History Narrative   Married. 1 child and 1 step child. 2 grandchildren.       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 Oropharynx normal Neck: no thyromegaly CV: RRR no murmurs rubs or gallops Lungs: CTAB no crackles, wheeze, rhonchi Abdomen: soft/nontender/nondistended/normal bowel sounds. No rebound or guarding.  Ext: no edema Skin: warm, dry, 3 x 3 mm slightly raised area with some excoriation in right hairline just in front of ear Neuro: grossly normal, moves all extremities, PERRLA  Procedure note: Benefits and risks verbally discussed with patient 10 second freeze thaw cycle of cryotherapy performed  with liquid nitrogen to inflamed/irritated seborrheic keratoses in right hairline No complications.  Patient tolerated the procedure well other than mild pain.     Assessment and Plan  74 y.o. male presenting for annual physical.  Health Maintenance counseling: 1. Anticipatory guidance: Patient counseled regarding regular dental exams once a year, eye exams - around yearly,  avoiding smoking and second hand smoke , limiting alcohol to 2 beverages per day - 2-3 a week- cautious with fatty liver.   2. Risk factor reduction:  Advised patient of need for regular exercise and diet rich and fruits and vegetables to reduce risk of heart attack and stroke. Exercise- swimming 3x a week and walks with wife sometimes. Diet-weight largely stable- hed like to be 10 lbs lower but I told him current weighs is reasonable. .  Wt Readings from Last 3 Encounters:  08/01/20 169 lb (76.7 kg)  07/22/20 169  lb (76.7 kg)  07/15/20 169 lb (76.7 kg)  3.  Immunizations/screenings/ancillary studies- covid 19 vaccine- will send [place] dates. . Recommended Tdap at the pharmacy. shingrix advised Immunization History  Administered Date(s) Administered  . Hepatitis A, Adult 08/08/2015, 05/26/2016  . Influenza Split 11/09/2011, 09/20/2012, 09/12/2018  . Influenza Whole 11/14/2007  . Influenza, High Dose Seasonal PF 08/08/2015, 09/02/2017, 08/31/2019  . Influenza,inj,Quad PF,6+ Mos 09/14/2013, 12/27/2014, 09/12/2018  . Influenza-Unspecified 08/22/2019  . Pneumococcal Conjugate-13 01/30/2015  . Pneumococcal Polysaccharide-23 11/09/2011  . Td 10/04/2008   4. Prostate cancer screening-past age based screening recommendations for PSA.  We had a discussion today and he would prefer to do at least 1 more recheck-we can discuss again on an annual basis Lab Results  Component Value Date   PSA 0.97 07/04/2018   PSA 1.25 06/09/2017   PSA 0.99 05/15/2016   5. Colon cancer screening - 04/23/20 with 3 year repeat 6. Skin cancer screening- encouraged him to follow up with Dr. Delman Cheadle or [facility] as has nonhealing wound on right ear id like for them to evaluate.   advised regular sunscreen use. Denies worrisome, changing, or new skin lesions other than irritated lesion right hairline 7. former smoker- quit in 1990s. 20 pack years. Had ct 11/22/2019 with no aortic aneurysm- no further screening needed.  8. STD screening - monogamous   Status of chronic or acute concerns   #CV issues 1.  Coronary artery disease-compliant with Xarelto-not on aspirin as a result of Xarelto 2.  Hyperlipidemia-compliant with rosuvastatin 40 mgwith LDL goal 70 or less.  Also takes gemfibrozil, als on vascepa through [facility] 3.  Nonischemic cardiomyopathy/systolic heart failure -patient follows up with [facility].  Compliant with benazepril 5 mg metoprolol 25 mg extended release.  Has not required Lasix  4.  Sinus node dysfunctoin with pacemaker in place 5.  Atrial fibrillation and flutter-on  chronic Xarelto and metoprolol 6.  Aortic atherosclerosis-on statin and Xarelto A/P: all CV issues appear stable. No chest pain or shortness of breath. ontinue current meds  #Erectile dysfunction S: Patient taking Cialis 5 mg daily A/P: reasonable control- continue current meds    #Insomnia S: Medication: Ambien 5 mg.  Denies falls/abnormal dreams/sleepwalking/trouble driving next day  A/P: starting to build tolerance- may be worth taking a break and restarting as needed    #BPH S: Patient compliant with finasteride 5 mg.  Previously on Flomax- - he stopped it and is doing ok without it. Still with nocturia A/P: reasonable control- continue current meds    #Arthritis in back S: Patient on indomethacin 25 mg twice daily for 3 days maximum per week with meal A/P: knows of GI bleed risk but simply cant tolerate not having this when back flares up.   - we discussed possible trial of prednisone with next flare or refer to sports medicine    # Hyperglycemia/insulin resistance/prediabetes-peak A1c 5.9 S:  Medication:  none Exercise and diet- see above A/P: hopefully controlled update a1cg   #hypertension S: medication: Metoprolol 25 mg extended release, benazepril 5 mg A/P: Stable. Continue current medications.      #Neuropathy-burning sensation in feetFor over 5 years-has seen Dr. Posey Pronto in the past- she advised aspercreme but he cant reach feet  #Abdominal pain December 2020 -CT abdomen pelvis with no cause of pain. Noted fatty liver, aortic atherosclerosis, enlarged prostate  #Gout-not on uric acid lowering agent. Will check uric acid . No recent flares- get new baseline.   #GERD-omeprazole helps - if misses doses  and eats too much has issues   # irritated seborrheic keratosis- within right hairline. Very itchy and bothersome. Some fine scale on top. We will attempt cryotherapy for removal. If this is not effective- would refer to dermatology.   Recommended follow up: Return in  about 6 months (around 01/29/2021) for follow up- or sooner if needed.  Lab/Order associations: cbc, cmp, lipid fasting. Added labs at end of day   ICD-10-CM   1. Preventative health care  Z00.00   2. Seborrheic keratoses, inflamed  L82.0   3. Essential hypertension  I10   4. Gastroesophageal reflux disease without esophagitis  K21.9   5. Acute idiopathic gout of left ankle  M10.072   6. Hyperglycemia  R73.9   7. Hyperlipidemia, unspecified hyperlipidemia type  E78.5   8. Ventricular tachycardia, non-sustained (HCC)  I47.2   9. Nonischemic cardiomyopathy (HCC)  I42.8   10. Chronic systolic heart failure (HCC)  I50.22   11. Coronary artery disease involving native coronary artery of native heart without angina pectoris  I25.10   12. Atrial fibrillation and flutter (HCC)  I48.91    I48.92   13. Aortic atherosclerosis (HCC)  I70.0   14. Fatty liver  K76.0   15. BPH associated with nocturia  N40.1    R35.1     No orders of the defined types were placed in this encounter.   Return precautions advised.  Garret Reddish, MD

## 2020-08-01 ENCOUNTER — Ambulatory Visit: Payer: Medicare PPO | Admitting: Family Medicine

## 2020-08-01 ENCOUNTER — Other Ambulatory Visit: Payer: Self-pay

## 2020-08-01 ENCOUNTER — Other Ambulatory Visit: Payer: Self-pay | Admitting: *Deleted

## 2020-08-01 ENCOUNTER — Other Ambulatory Visit: Payer: Medicare PPO

## 2020-08-01 ENCOUNTER — Ambulatory Visit (INDEPENDENT_AMBULATORY_CARE_PROVIDER_SITE_OTHER): Payer: Medicare PPO | Admitting: Family Medicine

## 2020-08-01 ENCOUNTER — Encounter: Payer: Self-pay | Admitting: Family Medicine

## 2020-08-01 VITALS — BP 116/62 | HR 67 | Temp 98.2°F | Ht 68.0 in | Wt 169.0 lb

## 2020-08-01 DIAGNOSIS — N401 Enlarged prostate with lower urinary tract symptoms: Secondary | ICD-10-CM

## 2020-08-01 DIAGNOSIS — I700 Atherosclerosis of aorta: Secondary | ICD-10-CM

## 2020-08-01 DIAGNOSIS — R739 Hyperglycemia, unspecified: Secondary | ICD-10-CM | POA: Diagnosis not present

## 2020-08-01 DIAGNOSIS — M10072 Idiopathic gout, left ankle and foot: Secondary | ICD-10-CM

## 2020-08-01 DIAGNOSIS — E785 Hyperlipidemia, unspecified: Secondary | ICD-10-CM

## 2020-08-01 DIAGNOSIS — I472 Ventricular tachycardia: Secondary | ICD-10-CM | POA: Diagnosis not present

## 2020-08-01 DIAGNOSIS — I5022 Chronic systolic (congestive) heart failure: Secondary | ICD-10-CM

## 2020-08-01 DIAGNOSIS — K219 Gastro-esophageal reflux disease without esophagitis: Secondary | ICD-10-CM | POA: Diagnosis not present

## 2020-08-01 DIAGNOSIS — Z0001 Encounter for general adult medical examination with abnormal findings: Secondary | ICD-10-CM | POA: Diagnosis not present

## 2020-08-01 DIAGNOSIS — I10 Essential (primary) hypertension: Secondary | ICD-10-CM

## 2020-08-01 DIAGNOSIS — L820 Inflamed seborrheic keratosis: Secondary | ICD-10-CM | POA: Diagnosis not present

## 2020-08-01 DIAGNOSIS — I2510 Atherosclerotic heart disease of native coronary artery without angina pectoris: Secondary | ICD-10-CM

## 2020-08-01 DIAGNOSIS — I428 Other cardiomyopathies: Secondary | ICD-10-CM | POA: Diagnosis not present

## 2020-08-01 DIAGNOSIS — Z0000 Encounter for general adult medical examination without abnormal findings: Secondary | ICD-10-CM

## 2020-08-01 DIAGNOSIS — I4892 Unspecified atrial flutter: Secondary | ICD-10-CM

## 2020-08-01 DIAGNOSIS — K760 Fatty (change of) liver, not elsewhere classified: Secondary | ICD-10-CM

## 2020-08-01 DIAGNOSIS — R351 Nocturia: Secondary | ICD-10-CM

## 2020-08-01 DIAGNOSIS — I4729 Other ventricular tachycardia: Secondary | ICD-10-CM

## 2020-08-01 DIAGNOSIS — I4891 Unspecified atrial fibrillation: Secondary | ICD-10-CM

## 2020-08-01 MED ORDER — ZOLPIDEM TARTRATE 5 MG PO TABS
5.0000 mg | ORAL_TABLET | Freq: Every day | ORAL | 3 refills | Status: DC
Start: 2020-08-01 — End: 2021-02-25

## 2020-08-01 MED ORDER — BENAZEPRIL HCL 5 MG PO TABS
5.0000 mg | ORAL_TABLET | Freq: Every day | ORAL | 3 refills | Status: DC
Start: 2020-08-01 — End: 2021-05-19

## 2020-08-01 MED ORDER — TADALAFIL 5 MG PO TABS: 5.0000 mg | ORAL_TABLET | Freq: Every day | ORAL | 3 refills | Status: DC | PRN

## 2020-08-01 MED ORDER — ROSUVASTATIN CALCIUM 40 MG PO TABS
40.0000 mg | ORAL_TABLET | Freq: Every day | ORAL | 3 refills | Status: DC
Start: 2020-08-01 — End: 2021-10-27

## 2020-08-01 MED ORDER — OMEPRAZOLE 20 MG PO CPDR
20.0000 mg | DELAYED_RELEASE_CAPSULE | Freq: Every day | ORAL | 3 refills | Status: DC
Start: 2020-08-01 — End: 2020-10-17

## 2020-08-01 MED ORDER — INDOMETHACIN 25 MG PO CAPS: 25.0000 mg | ORAL_CAPSULE | Freq: Two times a day (BID) | ORAL | 0 refills | Status: DC | PRN

## 2020-08-01 MED ORDER — SILDENAFIL CITRATE 100 MG PO TABS: 100.0000 mg | ORAL_TABLET | Freq: Every day | ORAL | 11 refills | Status: DC | PRN

## 2020-08-01 MED ORDER — METOPROLOL SUCCINATE ER 25 MG PO TB24: 25.0000 mg | ORAL_TABLET | Freq: Every day | ORAL | 3 refills | Status: AC

## 2020-08-01 MED ORDER — CYCLOBENZAPRINE HCL 5 MG PO TABS: 5.0000 mg | ORAL_TABLET | Freq: Three times a day (TID) | ORAL | 2 refills | Status: DC | PRN

## 2020-08-01 MED ORDER — FINASTERIDE 5 MG PO TABS
5.0000 mg | ORAL_TABLET | Freq: Every day | ORAL | 3 refills | Status: DC
Start: 2020-08-01 — End: 2021-09-01

## 2020-08-01 NOTE — Addendum Note (Signed)
Addended by: Francella Solian on: 08/01/2020 03:43 PM   Modules accepted: Orders

## 2020-08-02 ENCOUNTER — Encounter: Payer: Self-pay | Admitting: Family Medicine

## 2020-08-03 LAB — CBC WITH DIFFERENTIAL/PLATELET
Absolute Monocytes: 630 cells/uL (ref 200–950)
Basophils Absolute: 38 cells/uL (ref 0–200)
Basophils Relative: 0.5 %
Eosinophils Absolute: 218 cells/uL (ref 15–500)
Eosinophils Relative: 2.9 %
HCT: 43.3 % (ref 38.5–50.0)
Hemoglobin: 14.5 g/dL (ref 13.2–17.1)
Lymphs Abs: 2430 cells/uL (ref 850–3900)
MCH: 29.7 pg (ref 27.0–33.0)
MCHC: 33.5 g/dL (ref 32.0–36.0)
MCV: 88.5 fL (ref 80.0–100.0)
MPV: 10.3 fL (ref 7.5–12.5)
Monocytes Relative: 8.4 %
Neutro Abs: 4185 cells/uL (ref 1500–7800)
Neutrophils Relative %: 55.8 %
Platelets: 276 10*3/uL (ref 140–400)
RBC: 4.89 10*6/uL (ref 4.20–5.80)
RDW: 13.1 % (ref 11.0–15.0)
Total Lymphocyte: 32.4 %
WBC: 7.5 10*3/uL (ref 3.8–10.8)

## 2020-08-03 LAB — LIPID PANEL
Cholesterol: 136 mg/dL (ref <200)
HDL: 40 mg/dL (ref >=40)
LDL Cholesterol (Calc): 73 mg/dL (calc)
Non-HDL Cholesterol (Calc): 96 mg/dL (calc) (ref <130)
Total CHOL/HDL Ratio: 3.4 (calc) (ref <5.0)
Triglycerides: 149 mg/dL (ref <150)

## 2020-08-03 LAB — COMPREHENSIVE METABOLIC PANEL
AG Ratio: 1.8 (calc) (ref 1.0–2.5)
ALT: 13 U/L (ref 9–46)
AST: 18 U/L (ref 10–35)
Albumin: 4.4 g/dL (ref 3.6–5.1)
Alkaline phosphatase (APISO): 74 U/L (ref 35–144)
BUN/Creatinine Ratio: 13 (calc) (ref 6–22)
BUN: 17 mg/dL (ref 7–25)
CO2: 27 mmol/L (ref 20–32)
Calcium: 9.7 mg/dL (ref 8.6–10.3)
Chloride: 106 mmol/L (ref 98–110)
Creat: 1.29 mg/dL — ABNORMAL HIGH (ref 0.70–1.18)
Globulin: 2.5 g/dL (calc) (ref 1.9–3.7)
Glucose, Bld: 119 mg/dL — ABNORMAL HIGH (ref 65–99)
Potassium: 4.4 mmol/L (ref 3.5–5.3)
Sodium: 140 mmol/L (ref 135–146)
Total Bilirubin: 0.7 mg/dL (ref 0.2–1.2)
Total Protein: 6.9 g/dL (ref 6.1–8.1)

## 2020-08-03 LAB — HEMOGLOBIN A1C W/OUT EAG: Hgb A1c MFr Bld: 5.8 % of total Hgb — ABNORMAL HIGH (ref <5.7)

## 2020-08-03 LAB — URIC ACID: Uric Acid, Serum: 7.4 mg/dL (ref 4.0–8.0)

## 2020-08-03 LAB — TEST AUTHORIZATION

## 2020-08-03 LAB — PSA: PSA: 1.5 ng/mL (ref <=4.0)

## 2020-09-10 ENCOUNTER — Other Ambulatory Visit: Payer: Self-pay | Admitting: Internal Medicine

## 2020-10-17 ENCOUNTER — Other Ambulatory Visit: Payer: Self-pay | Admitting: Family Medicine

## 2020-10-17 ENCOUNTER — Other Ambulatory Visit: Payer: Self-pay

## 2020-10-17 ENCOUNTER — Ambulatory Visit (INDEPENDENT_AMBULATORY_CARE_PROVIDER_SITE_OTHER): Payer: Medicare PPO | Admitting: Emergency Medicine

## 2020-10-17 DIAGNOSIS — I495 Sick sinus syndrome: Secondary | ICD-10-CM

## 2020-10-17 LAB — CUP PACEART INCLINIC DEVICE CHECK
Brady Statistic RA Percent Paced: 97 %
Brady Statistic RV Percent Paced: 23 %
Date Time Interrogation Session: 20211118151957
Implantable Lead Implant Date: 20031017
Implantable Lead Implant Date: 20031017
Implantable Lead Location: 753859
Implantable Lead Location: 753860
Implantable Lead Model: 5076
Implantable Lead Model: 5076
Implantable Pulse Generator Implant Date: 20120210
Lead Channel Impedance Value: 331 Ohm
Lead Channel Impedance Value: 390 Ohm
Lead Channel Pacing Threshold Amplitude: 0.4 V
Lead Channel Pacing Threshold Amplitude: 1.2 V
Lead Channel Pacing Threshold Pulse Width: 0.4 ms
Lead Channel Pacing Threshold Pulse Width: 0.4 ms
Lead Channel Setting Pacing Amplitude: 2 V
Lead Channel Setting Pacing Amplitude: 2.4 V
Lead Channel Setting Pacing Pulse Width: 0.4 ms
Pulse Gen Serial Number: 66063261

## 2020-10-17 NOTE — Progress Notes (Signed)
Pacemaker check in clinic. Normal device function. Thresholds, sensing, impedances consistent with previous measurements. Device programmed to maximize longevity. No mode switch or high ventricular rates noted. Device programmed at appropriate safety margins. Histogram distribution appropriate for patient activity level. CLS reprogrammed to Low, industry present at programming. Estimated longevity 35%. Patient enrolled in remote follow-up in clinic 04/15/20. Patient education completed.

## 2020-10-17 NOTE — Patient Instructions (Signed)
Please call the device clinic if you do not feel improvement after the changes.  [facility] [phone]

## 2020-11-14 DIAGNOSIS — D225 Melanocytic nevi of trunk: Secondary | ICD-10-CM | POA: Diagnosis not present

## 2020-11-14 DIAGNOSIS — L304 Erythema intertrigo: Secondary | ICD-10-CM | POA: Diagnosis not present

## 2020-11-14 DIAGNOSIS — L578 Other skin changes due to chronic exposure to nonionizing radiation: Secondary | ICD-10-CM | POA: Diagnosis not present

## 2020-11-14 DIAGNOSIS — L821 Other seborrheic keratosis: Secondary | ICD-10-CM | POA: Diagnosis not present

## 2020-11-14 DIAGNOSIS — L570 Actinic keratosis: Secondary | ICD-10-CM | POA: Diagnosis not present

## 2020-12-12 DIAGNOSIS — Z03818 Encounter for observation for suspected exposure to other biological agents ruled out: Secondary | ICD-10-CM | POA: Diagnosis not present

## 2020-12-12 DIAGNOSIS — Z20822 Contact with and (suspected) exposure to covid-19: Secondary | ICD-10-CM | POA: Diagnosis not present

## 2020-12-31 DIAGNOSIS — Z20822 Contact with and (suspected) exposure to covid-19: Secondary | ICD-10-CM | POA: Diagnosis not present

## 2020-12-31 DIAGNOSIS — Z03818 Encounter for observation for suspected exposure to other biological agents ruled out: Secondary | ICD-10-CM | POA: Diagnosis not present

## 2021-02-24 ENCOUNTER — Other Ambulatory Visit: Payer: Self-pay | Admitting: Family Medicine

## 2021-03-16 DIAGNOSIS — Z20822 Contact with and (suspected) exposure to covid-19: Secondary | ICD-10-CM | POA: Diagnosis not present

## 2021-03-19 ENCOUNTER — Telehealth: Payer: Self-pay | Admitting: Family Medicine

## 2021-03-19 NOTE — Telephone Encounter (Signed)
Tried calling patient to  schedule Medicare Annual Wellness Visit (AWV) either virtually or in office.  No answer   Last AWV 02/15/20  please schedule at anytime with health coach  This should be a 45 minute visit.

## 2021-04-15 ENCOUNTER — Telehealth: Payer: Self-pay

## 2021-04-15 ENCOUNTER — Other Ambulatory Visit: Payer: Self-pay

## 2021-04-15 ENCOUNTER — Ambulatory Visit (INDEPENDENT_AMBULATORY_CARE_PROVIDER_SITE_OTHER): Payer: Medicare PPO | Admitting: Emergency Medicine

## 2021-04-15 DIAGNOSIS — I495 Sick sinus syndrome: Secondary | ICD-10-CM

## 2021-04-15 DIAGNOSIS — Z950 Presence of cardiac pacemaker: Secondary | ICD-10-CM

## 2021-04-15 NOTE — Telephone Encounter (Signed)
noted 

## 2021-04-15 NOTE — Telephone Encounter (Signed)
Need to order a POET with Ruby Cola with [employer] on a day that Dr. Lovena Le is here in the office

## 2021-04-21 LAB — CUP PACEART INCLINIC DEVICE CHECK
Battery Remaining Longevity: 36 mo
Brady Statistic RA Percent Paced: 99 %
Brady Statistic RV Percent Paced: 14 %
Date Time Interrogation Session: 20220517142200
Implantable Lead Implant Date: 20031017
Implantable Lead Implant Date: 20031017
Implantable Lead Location: 753859
Implantable Lead Location: 753860
Implantable Lead Model: 5076
Implantable Lead Model: 5076
Implantable Pulse Generator Implant Date: 20120210
Lead Channel Impedance Value: 351 Ohm
Lead Channel Impedance Value: 409 Ohm
Lead Channel Pacing Threshold Amplitude: 0.4 V
Lead Channel Pacing Threshold Amplitude: 1.4 V
Lead Channel Pacing Threshold Pulse Width: 0.4 ms
Lead Channel Pacing Threshold Pulse Width: 0.4 ms
Lead Channel Sensing Intrinsic Amplitude: 11.4 mV
Lead Channel Setting Pacing Amplitude: 2 V
Lead Channel Setting Pacing Amplitude: 2.4 V
Lead Channel Setting Pacing Pulse Width: 0.4 ms
Pulse Gen Serial Number: 66063261

## 2021-04-21 NOTE — Progress Notes (Signed)
Pacemaker check in clinic. Patient reports drop in HR from 140's to 60's when swimming. He reports dizziness, feeling like his extremities are " rubber" during the episodes. Possible atrial weinkebach during exercise and GT will schedule follow-up to evaluate res[onse during activity. Normal device function. Thresholds, sensing, impedances consistent with previous measurements. Device programmed to maximize longevity.1 mode switch. No  high ventricular rates noted. Device programmed at appropriate safety margins. Histogram distribution appropriate for patient activity level. Device programmed to optimize intrinsic conduction. Estimated longevity 3 years. Patient scheduled for next in-clinic check 11/15/21. Patient education completed.

## 2021-04-25 NOTE — Telephone Encounter (Signed)
Noted  

## 2021-04-29 ENCOUNTER — Telehealth: Payer: Self-pay | Admitting: Internal Medicine

## 2021-04-29 ENCOUNTER — Ambulatory Visit (INDEPENDENT_AMBULATORY_CARE_PROVIDER_SITE_OTHER): Payer: Medicare PPO

## 2021-04-29 ENCOUNTER — Other Ambulatory Visit: Payer: Self-pay

## 2021-04-29 DIAGNOSIS — Z950 Presence of cardiac pacemaker: Secondary | ICD-10-CM

## 2021-04-29 DIAGNOSIS — I495 Sick sinus syndrome: Secondary | ICD-10-CM | POA: Diagnosis not present

## 2021-04-29 LAB — EXERCISE TOLERANCE TEST
Estimated workload: 5.1 METS
Exercise duration (min): 8 min
Exercise duration (sec): 0 s
MPHR: 146 {beats}/min
Peak HR: 90 {beats}/min
Percent HR: 61 %
Rest HR: 64 {beats}/min

## 2021-04-29 NOTE — Telephone Encounter (Signed)
    Pt said during his ETT today he met with Dr. Lovena Le and was told to call us to schedule an appt with him by the end of July around 07/26, no available appt with Dr. Lovena Le and he only wants to see Dr. Lovena Le

## 2021-05-01 NOTE — Telephone Encounter (Signed)
Scheduled follow-up

## 2021-05-15 ENCOUNTER — Other Ambulatory Visit: Payer: Self-pay

## 2021-05-15 ENCOUNTER — Ambulatory Visit: Payer: Medicare PPO | Admitting: Family

## 2021-05-15 ENCOUNTER — Encounter: Payer: Self-pay | Admitting: Family

## 2021-05-15 VITALS — BP 137/80 | HR 81 | Temp 98.4°F | Ht 68.0 in | Wt 170.4 lb

## 2021-05-15 DIAGNOSIS — D485 Neoplasm of uncertain behavior of skin: Secondary | ICD-10-CM

## 2021-05-15 DIAGNOSIS — L989 Disorder of the skin and subcutaneous tissue, unspecified: Secondary | ICD-10-CM

## 2021-05-15 NOTE — Progress Notes (Signed)
Acute Office Visit  Subjective:    Patient ID: [name], male    DOB: 1946-03-16, 75 y.o.   MRN: [id number]  Chief Complaint  Patient presents with   Mass    Pt says that spot has been there for 6-8 months. Located on the right side of his chest. It started off small, but has now grown. He denies pain or itching.    HPI Patient is in today with concerns of a lesion on his right upper chest that has been growing for the last month. It has been present 6-8 months. He had a full body check with dermatology about 6 months ago and was cleared. Patient denies any pain or itching. Has not applied anything. It does not bleed.   Past Medical History:  Diagnosis Date   Abscess    right posterior neck   Anticoagulation adequate 06/07/2019   Overview:  SSS, paroxAFib/Flutter, CHADSvasc= 3 age, h/o cva; chronic [place] Xarelto   Atrial fibrillation and flutter ([place]) 01/01/2015   Noted 2016. Xarelto    BPH associated with nocturia 08/14/2014   Finasteride, flomax.  0-3x nocturia. PSA to 14-->eval urology [facility]. PSA trended back down to about 2.6 per records in 2015 then placed on finasteride- had significant voiding issues when spiked to 14- improved on meds    Bradycardia    CAD (coronary artery disease) 05/26/2016   Coronary CTA_ 50-75% D1 off CT report    Cardiac pacemaker in situ 12/28/2009   SA node dysfunction as cause    Chest pressure 12/01/2015   Overview:  atypical CP, [facility] nuclear perfusion imaging w/ no inarct, no ischemia, EF-40% NICM   Chronic systolic heart failure ([place]) 12/01/2015   Overview:  [facility] nuclear perfusion imaging study, no inarct, no ischemia, EF-40% NICM   CVA (cerebral vascular accident) ([place])    Double vision 07/04/2018   Elevated LFTs    Erectile dysfunction 01/30/2015   cialis prn    External hemorrhoids    Fitting or adjustment of cardiac pacemaker 06/07/2019   Overview:  [place] [id number], RA and RV leads [id number]:  [id number] and [id number]   Former smoker  01/30/2015   Former smoker. 20 pack years quit 92. Patient declined AAA screen.     GERD (gastroesophageal reflux disease)    Gout 11/11/2009   Qualifier: Diagnosis of  By: Lynden Ang     History of nuclear stress test 06/07/2019   Overview:  [facility], no inarct, no ischemia, EF-40% NICM   HTN (hypertension)    Hyperglycemia 06/10/2017   110s CBG 2018   Hyperlipidemia 07/12/2007   Crestor 20mg  daily with some myalgias; fish oil. Trig 200-500 despite this. LDL ok.        Insomnia 01/30/2015   ambien 5mg  prn. May refill.     Ischemic optic neuropathy    Left chest pressure 03/19/2015   Other and unspecified hyperlipidemia    Sinoatrial node dysfunction (HCC)    Spinal stenosis    Trigger finger, right middle finger 03/01/2018    Past Surgical History:  Procedure Laterality Date   ABSCESS DRAINAGE     righ tposterior neck   CARDIAC CATHETERIZATION  1996   CHOLECYSTECTOMY  02/07/2003   COLONOSCOPY  06/23/2004   EYE SURGERY     left eye   left ingunial hernia  06/08/2011   PACEMAKER INSERTION     right hip replacement     2012 Dr. Maureen Ralphs    Family History  Problem Relation  Age of Onset   Lung cancer Mother        mets to brain   Heart attack Mother 31   Hypertension Mother    Brain cancer Mother    Heart attack Father        mid 38s, smoker.    Diabetes Father    Cancer Brother    Diabetes Paternal Grandfather    Healthy Brother    Colon cancer Neg Hx    Esophageal cancer Neg Hx    Stomach cancer Neg Hx    Rectal cancer Neg Hx     Social History   Socioeconomic History   Marital status: Married    Spouse name: Not on file   Number of children: 1   Years of education: 20   Highest education level: Not on file  Occupational History   Not on file  Tobacco Use   Smoking status: Former    Packs/day: 1.00    Years: 20.00    Pack years: 20.00    Types: Cigarettes    Quit date: 11/04/1991    Years since quitting: 29.5   Smokeless tobacco: Never  Vaping Use    Vaping Use: Never used  Substance and Sexual Activity   Alcohol use: Yes    Alcohol/week: 0.0 standard drinks    Comment: occ   Drug use: No   Sexual activity: Not on file  Other Topics Concern   Not on file  Social History Narrative   Married. 1 child and 1 step child. 2 grandchildren.       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 90 tablet 3   cyclobenzaprine (FLEXERIL) 5 MG tablet Take 1 tablet (5 mg total) by mouth 3 (three) times daily as needed for muscle spasms. 30 tablet 2   finasteride (PROSCAR) 5 MG tablet Take 1 tablet (5 mg total) by mouth daily. 90 tablet 3   gemfibrozil (LOPID) 600 MG tablet TAKE 1 TABLET BY MOUTH TWICE DAILY BEFORE  A  MEAL 180 tablet 3   indomethacin (INDOCIN) 25 MG capsule Take 1 capsule (25 mg total) by mouth 2 (two) times daily as needed (pain). Take with a meal. 3 days maximun 30 capsule 0   metoprolol succinate (TOPROL-XL) 25 MG 24 hr tablet Take 1 tablet (25 mg total) by mouth daily. 90 tablet 3   Multiple Vitamin (MULTIVITAMIN) tablet Take 1 tablet by mouth daily.     omeprazole (PRILOSEC) 20 MG capsule Take 1 capsule by mouth once daily 90 capsule 0   rosuvastatin (CRESTOR) 40 MG tablet Take 1 tablet (40 mg total) by mouth daily. 90 tablet 3   sildenafil (VIAGRA) 100 MG tablet Take 1 tablet (100 mg total) by mouth daily as needed for erectile dysfunction. 10 tablet 11   tadalafil (CIALIS) 5 MG tablet Take 1 tablet (5 mg total) by mouth daily as needed for  erectile dysfunction. 90 tablet 3  XARELTO 20 MG TABS tablet TAKE 1 TABLET BY MOUTH ONCE DAILY WITH SUPPER 90 tablet 2   zolpidem (AMBIEN) 5 MG tablet TAKE 1 TABLET BY MOUTH AT BEDTIME 90 tablet 1   No facility-administered medications prior to visit.    No Known Allergies  Review of Systems  Constitutional: Negative.   Respiratory: Negative.    Cardiovascular: Negative.   Skin:        Skin lesion on the right upper chest x 6-8 months      Objective:    Physical Exam Vitals and nursing note reviewed.  Constitutional:      Appearance: Normal appearance.  Cardiovascular:     Rate and Rhythm: Normal rate and regular rhythm.     Pulses: Normal pulses.     Heart sounds: Normal heart sounds.  Pulmonary:     Effort: Pulmonary effort is normal.     Breath sounds: Normal breath sounds.  Skin:    Comments: 1.5 cm irregular, rough, palpable textured lesion to the right upper chest. Oval-like shape.   Neurological:     General: No focal deficit present.     Mental Status: He is alert and oriented to person, place, and time.  Psychiatric:        Mood and Affect: Mood normal.        Behavior: Behavior normal.    BP 137/80   Pulse 81   Temp 98.4 F (36.9 C) (Temporal)   Ht 5\' 8"  (1.727 m)   Wt 170 lb 6.4 oz (77.3 kg)   SpO2 94%   BMI 25.91 kg/m  Wt Readings from Last 3 Encounters:  05/15/21 170 lb 6.4 oz (77.3 kg)  08/01/20 169 lb (76.7 kg)  07/22/20 169 lb (76.7 kg)    Health Maintenance Due  Topic Date Due   Zoster Vaccines- Shingrix (1 of 2) Never done   TETANUS/TDAP  10/04/2018    There are no preventive care reminders to display for this patient.   Lab Results  Component Value Date   TSH 3.84 04/19/2019   Lab Results  Component Value Date   WBC 7.5 08/01/2020   HGB 14.5 08/01/2020   HCT 43.3 08/01/2020   MCV 88.5 08/01/2020   PLT 276 08/01/2020   Lab Results  Component Value Date   NA 140 08/01/2020   K 4.4 08/01/2020   CO2 27 08/01/2020    GLUCOSE 119 (H) 08/01/2020   BUN 17 08/01/2020   CREATININE 1.29 (H) 08/01/2020   BILITOT 0.7 08/01/2020   ALKPHOS 78 11/16/2019   AST 18 08/01/2020   ALT 13 08/01/2020   PROT 6.9 08/01/2020   ALBUMIN 4.5 11/16/2019   CALCIUM 9.7 08/01/2020   GFR 61.70 11/16/2019   Lab Results  Component Value Date   CHOL 136 08/01/2020   Lab Results  Component Value Date   HDL 40 08/01/2020   Lab Results  Component Value Date   LDLCALC 73 08/01/2020   Lab Results  Component Value Date   TRIG 149 08/01/2020   Lab Results  Component Value Date   CHOLHDL 3.4 08/01/2020   Lab Results  Component Value Date   HGBA1C 5.8 (H) 08/01/2020       Assessment & Plan:   Problem List Items Addressed This Visit   None Visit Diagnoses     Neoplasm of uncertain behavior of skin of chest    -  Primary       Patient is concerned that he will not be  able to swim tonight if the lesion is removed today. Wishes to return tomorrow to have the lesion biopsied and sent to pathology.     Kennyth Arnold, FNP

## 2021-05-15 NOTE — Patient Instructions (Signed)
Skin Biopsy A skin biopsy is a procedure to remove a sample of your skin (lesion) so that it can be checked under a microscope. You may need a skin biopsy ifyou have a skin disease or abnormal changes in your skin. Tell a health care provider about: Any allergies you have. All medicines you are taking, including vitamins, herbs, eye drops, creams, and over-the-counter medicines. Any problems you or family members have had with anesthetic medicines. Any blood disorders you have. Any surgeries you have had. Any medical conditions you have or have had. Whether you are pregnant or may be pregnant. What are the risks? Generally, this is a safe procedure. However, problems may occur, including: Infection. Bleeding. Allergic reaction to medicines. Scarring. What happens before the procedure? Ask your health care provider about: Changing or stopping your regular medicines. This is especially important if you are taking blood thinners. Taking medicines such as aspirin and ibuprofen. These medicines can thin your blood. Do not take these medicines unless your health care provider tells you to take them. Taking over-the-counter medicines, vitamins, herbs, and supplements. Ask your health care provider if you will need someone to take you home from the hospital or clinic after the procedure. Ask your health care provider how your biopsy site will be marked or identified. Ask your health care provider what steps will be taken to help prevent infection. These may include: Removing hair at the surgery site. Washing skin with a germ-killing soap. Taking antibiotic medicine. What happens during the procedure?  You may be given medicine to numb the area (local anesthetic). Your health care provider will take a sample using one of these steps, depending on the type of skin problem that you have: Shave biopsy. Your health care provider will shave away layers of your skin lesion with a sharp blade. After  shaving, a gel or ointment may be used to control bleeding. Punch biopsy. Your health care provider will use a tool to remove all or part of the lesion. This leaves a small hole about the width of a pencil eraser. The area may be covered with a gel or ointment. Excisional or incisional biopsy. Your health care provider will use a surgical blade to remove all or part of your lesion. Your skin biopsy site may be closed with stitches (sutures). A bandage (dressing) will be applied. The procedure may vary among health care providers and hospitals. What happens after the procedure? Your skin sample will be sent to a lab for tests. Your skin biopsy site will be watched to make sure that it stops bleeding. You will be given instructions on how to care for your biopsy site. It is up to you to get the results of your procedure. Ask your health care provider, or the department that is doing the procedure, when your results will be ready. Summary A skin biopsy is a procedure to remove a sample of your skin (lesion) so that it can be checked under a microscope. Tell a health care provider about your medical history and all medicines you are taking, including vitamins, herbs, eye drops, creams, and over-the-counter medicines. Before the procedure, ask your health care provider about changing or stopping your regular medicines. During the procedure, your health care provider will take a skin sample from the area where you have the skin problem. After the procedure, your skin sample will be sent to a laboratory for testing. This information is not intended to replace advice given to you by your health care  provider. Make sure you discuss any questions you have with your healthcare provider. Document Revised: 05/16/2018 Document Reviewed: 05/16/2018 Elsevier Patient Education  [place].

## 2021-05-16 ENCOUNTER — Ambulatory Visit (INDEPENDENT_AMBULATORY_CARE_PROVIDER_SITE_OTHER): Payer: Medicare PPO | Admitting: Family

## 2021-05-16 ENCOUNTER — Other Ambulatory Visit: Payer: Self-pay

## 2021-05-16 ENCOUNTER — Encounter: Payer: Self-pay | Admitting: Family

## 2021-05-16 VITALS — BP 107/71 | HR 97 | Temp 98.0°F | Ht 68.0 in | Wt 168.8 lb

## 2021-05-16 DIAGNOSIS — L820 Inflamed seborrheic keratosis: Secondary | ICD-10-CM | POA: Diagnosis not present

## 2021-05-16 DIAGNOSIS — D485 Neoplasm of uncertain behavior of skin: Secondary | ICD-10-CM

## 2021-05-16 NOTE — Patient Instructions (Signed)
Skin Biopsy, Care After  This sheet gives you information about how to care for yourself after your procedure. Your health care provider may also give you more specific instructions. If you have problems or questions, contact your health care provider.  What can I expect after the procedure?  After the procedure, it is common to have:  Soreness.  Bruising.  Itching.  Follow these instructions at home:  Biopsy site care    Follow instructions from your health care provider about how to take care of your biopsy site. Make sure you:  Wash your hands with soap and water before and after you change your bandage (dressing). If soap and water are not available, use hand sanitizer.  Apply ointment on your biopsy site as directed by your health care provider.  Change your dressing as told by your health care provider.  Leave stitches (sutures), skin glue, or adhesive strips in place. These skin closures may need to stay in place for 2 weeks or longer. If adhesive strip edges start to loosen and curl up, you may trim the loose edges. Do not remove adhesive strips completely unless your health care provider tells you to do that.  If the biopsy area bleeds, apply gentle pressure for 10 minutes.  Check your biopsy site every day for signs of infection. Check for:  Redness, swelling, or pain.  Fluid or blood.  Warmth.  Pus or a bad smell.     General instructions  Rest and then return to your normal activities as told by your health care provider.  Take over-the-counter and prescription medicines only as told by your health care provider.  Keep all follow-up visits as told by your health care provider. This is important.  Contact a health care provider if:  You have redness, swelling, or pain around your biopsy site.  You have fluid or blood coming from your biopsy site.  Your biopsy site feels warm to the touch.  You have pus or a bad smell coming from your biopsy site.  You have a fever.  Your sutures, skin glue, or adhesive  strips loosen or come off sooner than expected.  Get help right away if:  You have bleeding that does not stop with pressure or a dressing.  Summary  After the procedure, it is common to have soreness, bruising, and itching at the site.  Follow instructions from your health care provider about how to take care of your biopsy site.  Check your biopsy site every day for signs of infection.  Contact a health care provider if you have redness, swelling, or pain around your biopsy site, or your biopsy site feels warm to the touch.  Keep all follow-up visits as told by your health care provider. This is important.  This information is not intended to replace advice given to you by your health care provider. Make sure you discuss any questions you have with your health care provider.  Document Revised: 05/16/2018 Document Reviewed: 05/16/2018  Elsevier Patient Education  2022 Elsevier Inc.

## 2021-05-18 ENCOUNTER — Other Ambulatory Visit: Payer: Self-pay | Admitting: Family Medicine

## 2021-05-18 NOTE — Progress Notes (Signed)
Acute Office Visit  Subjective:    Patient ID: [name], male    DOB: 02-20-1946, 74 y.o.   MRN: [id number]  Chief Complaint  Patient presents with   mole removal     HPI Patient is in today for removal of a new, growing, rough appearing, irregular mass on the right chest that has been present approx 6-8 months. He is concerned that is is growing. No bleeding. Reports seeing dermatology 6 months ago and having a body check that was normal .    Past Medical History:  Diagnosis Date   Abscess    right posterior neck   Anticoagulation adequate 06/07/2019   Overview:  SSS, paroxAFib/Flutter, CHADSvasc= 3 age, h/o cva; chronic [place] Xarelto   Atrial fibrillation and flutter ([place]) 01/01/2015   Noted 2016. Xarelto    BPH associated with nocturia 08/14/2014   Finasteride, flomax.  0-3x nocturia. PSA to 14-->eval urology [facility]. PSA trended back down to about 2.6 per records in 2015 then placed on finasteride- had significant voiding issues when spiked to 14- improved on meds    Bradycardia    CAD (coronary artery disease) 05/26/2016   Coronary CTA_ 50-75% D1 off CT report    Cardiac pacemaker in situ 12/28/2009   SA node dysfunction as cause    Chest pressure 12/01/2015   Overview:  atypical CP, [facility] nuclear perfusion imaging w/ no inarct, no ischemia, EF-40% NICM   Chronic systolic heart failure ([place]) 12/01/2015   Overview:  [facility] nuclear perfusion imaging study, no inarct, no ischemia, EF-40% NICM   CVA (cerebral vascular accident) ([place])    Double vision 07/04/2018   Elevated LFTs    Erectile dysfunction 01/30/2015   cialis prn    External hemorrhoids    Fitting or adjustment of cardiac pacemaker 06/07/2019   Overview:  [place] [id number], RA and RV leads [id number]:  [id number] and [id number]   Former smoker 01/30/2015   Former smoker. 20 pack years quit 92. Patient declined AAA screen.     GERD (gastroesophageal reflux disease)    Gout 11/11/2009   Qualifier: Diagnosis of  By:  Lynden Ang     History of nuclear stress test 06/07/2019   Overview:  [facility], no inarct, no ischemia, EF-40% NICM   HTN (hypertension)    Hyperglycemia 06/10/2017   110s CBG 2018   Hyperlipidemia 07/12/2007   Crestor 20mg  daily with some myalgias; fish oil. Trig 200-500 despite this. LDL ok.        Insomnia 01/30/2015   ambien 5mg  prn. May refill.     Ischemic optic neuropathy    Left chest pressure 03/19/2015   Other and unspecified hyperlipidemia    Sinoatrial node dysfunction (HCC)    Spinal stenosis    Trigger finger, right middle finger 03/01/2018    Past Surgical History:  Procedure Laterality Date   ABSCESS DRAINAGE     righ tposterior neck   CARDIAC CATHETERIZATION  1996   CHOLECYSTECTOMY  02/07/2003   COLONOSCOPY  06/23/2004   EYE SURGERY     left eye   left ingunial hernia  06/08/2011   PACEMAKER INSERTION     right hip replacement     2012 Dr. Maureen Ralphs    Family History  Problem Relation Age of Onset   Lung cancer Mother        mets to brain   Heart attack Mother 80   Hypertension Mother    Brain cancer Mother    Heart attack  Father        mid 40s, smoker.    Diabetes Father    Cancer Brother    Diabetes Paternal Grandfather    Healthy Brother    Colon cancer Neg Hx    Esophageal cancer Neg Hx    Stomach cancer Neg Hx    Rectal cancer Neg Hx     Social History   Socioeconomic History   Marital status: Married    Spouse name: Not on file   Number of children: 1   Years of education: 20   Highest education level: Not on file  Occupational History   Not on file  Tobacco Use   Smoking status: Former    Packs/day: 1.00    Years: 20.00    Pack years: 20.00    Types: Cigarettes    Quit date: 11/04/1991    Years since quitting: 29.5   Smokeless tobacco: Never  Vaping Use   Vaping Use: Never used  Substance and Sexual Activity   Alcohol use: Yes    Alcohol/week: 0.0 standard drinks    Comment: occ   Drug use: No   Sexual activity: Not on file   Other Topics Concern   Not on file  Social History Narrative   Married. 1 child and 1 step child. 2 grandchildren.       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 90 tablet 3   cyclobenzaprine (FLEXERIL) 5 MG tablet Take 1 tablet (5 mg total) by mouth 3 (three) times daily as needed for muscle spasms. 30 tablet 2   finasteride (PROSCAR) 5 MG tablet Take 1 tablet (5 mg total) by mouth daily. 90 tablet 3   gemfibrozil (LOPID) 600 MG tablet TAKE 1 TABLET BY MOUTH TWICE DAILY BEFORE  A  MEAL 180 tablet 3   indomethacin (INDOCIN) 25 MG capsule Take 1 capsule (25 mg total) by mouth 2 (two) times daily as needed (pain). Take with a meal. 3 days maximun 30 capsule 0   metoprolol succinate (TOPROL-XL) 25 MG 24 hr tablet Take 1 tablet (25 mg total) by mouth daily. 90 tablet 3   Multiple Vitamin (MULTIVITAMIN) tablet Take 1 tablet by mouth daily.     omeprazole (PRILOSEC) 20 MG capsule Take 1 capsule by mouth once daily 90 capsule 0   rosuvastatin (CRESTOR) 40 MG tablet Take 1 tablet (40 mg total) by mouth daily. 90 tablet 3   sildenafil (VIAGRA) 100 MG tablet Take 1 tablet (100 mg total) by mouth daily as needed for erectile dysfunction. 10 tablet 11   tadalafil (CIALIS) 5 MG tablet Take 1 tablet (5 mg total) by mouth daily as needed for erectile dysfunction. 90 tablet 3   XARELTO 20 MG TABS tablet TAKE 1 TABLET BY MOUTH ONCE DAILY WITH SUPPER 90 tablet 2   zolpidem (AMBIEN) 5 MG tablet TAKE 1 TABLET BY MOUTH AT  BEDTIME 90 tablet 1   No  facility-administered medications prior to visit.    No Known Allergies  Review of Systems  Constitutional: Negative.   Respiratory: Negative.        Objective:    Physical Exam Vitals and nursing note reviewed.  Constitutional:      Appearance: Normal appearance.  Cardiovascular:     Rate and Rhythm: Normal rate and regular rhythm.  Skin:    Comments: Informed consent was given by the patient for a shave biopsy. The site was prepped with Betadine and using a 11 blade a 1.5 cm shave biopsy was obtained. The specimen was placed in preservative and sent for pathology. Hemostasis was achieved with a compression. Wound care was discussed with the patient. The patient was informed that it would be one to 2 weeks before the pathology will be interpreted.   Neurological:     General: No focal deficit present.     Mental Status: He is alert and oriented to person, place, and time.  Psychiatric:        Mood and Affect: Mood normal.        Behavior: Behavior normal.    BP 107/71   Pulse 97   Temp 98 F (36.7 C) (Temporal)   Ht 5\' 8"  (1.727 m)   Wt 168 lb 12.8 oz (76.6 kg)   SpO2 95%   BMI 25.67 kg/m  Wt Readings from Last 3 Encounters:  05/16/21 168 lb 12.8 oz (76.6 kg)  05/15/21 170 lb 6.4 oz (77.3 kg)  08/01/20 169 lb (76.7 kg)    There are no preventive care reminders to display for this patient.  There are no preventive care reminders to display for this patient.   Lab Results  Component Value Date   TSH 3.84 04/19/2019   Lab Results  Component Value Date   WBC 7.5 08/01/2020   HGB 14.5 08/01/2020   HCT 43.3 08/01/2020   MCV 88.5 08/01/2020   PLT 276 08/01/2020   Lab Results  Component Value Date   NA 140 08/01/2020   K 4.4 08/01/2020   CO2 27 08/01/2020   GLUCOSE 119 (H) 08/01/2020   BUN 17 08/01/2020   CREATININE 1.29 (H) 08/01/2020   BILITOT 0.7 08/01/2020   ALKPHOS 78 11/16/2019   AST 18 08/01/2020   ALT 13 08/01/2020    PROT 6.9 08/01/2020   ALBUMIN 4.5 11/16/2019   CALCIUM 9.7 08/01/2020   GFR 61.70 11/16/2019   Lab Results  Component Value Date   CHOL 136 08/01/2020   Lab Results  Component Value Date   HDL 40 08/01/2020   Lab Results  Component Value Date   LDLCALC 73 08/01/2020   Lab Results  Component Value Date   TRIG 149 08/01/2020   Lab Results  Component Value Date   CHOLHDL 3.4 08/01/2020   Lab Results  Component Value Date   HGBA1C 5.8 (H) 08/01/2020       Assessment & Plan:   Problem List Items Addressed This Visit   None Visit Diagnoses     Neoplasm of uncertain behavior of skin of chest    -  Primary   Relevant Orders   Derm biopsy        Will discuss pathology pending results.    Kennyth Arnold, FNP

## 2021-05-19 ENCOUNTER — Telehealth: Payer: Self-pay

## 2021-05-19 NOTE — Telephone Encounter (Signed)
Ok for early refill 

## 2021-05-19 NOTE — Telephone Encounter (Signed)
Early refill called into pharmacy.

## 2021-05-19 NOTE — Telephone Encounter (Addendum)
Patient called today to request an early refill on Ambien because he is going out of town- patient was told by the pharmacy that the MD would have to approve early refill-please advise  Patient needs this before Friday since he will be leaving to go out of town

## 2021-05-20 ENCOUNTER — Other Ambulatory Visit: Payer: Self-pay

## 2021-05-20 DIAGNOSIS — D485 Neoplasm of uncertain behavior of skin: Secondary | ICD-10-CM

## 2021-05-20 DIAGNOSIS — L820 Inflamed seborrheic keratosis: Secondary | ICD-10-CM | POA: Diagnosis not present

## 2021-05-20 NOTE — Addendum Note (Signed)
Addended by: Dutch Quint B on: 05/20/2021 12:53 PM   Modules accepted: Orders

## 2021-05-22 ENCOUNTER — Other Ambulatory Visit: Payer: Self-pay

## 2021-05-22 ENCOUNTER — Encounter: Payer: Self-pay | Admitting: Physician Assistant

## 2021-05-22 ENCOUNTER — Ambulatory Visit (INDEPENDENT_AMBULATORY_CARE_PROVIDER_SITE_OTHER): Payer: Medicare PPO | Admitting: Physician Assistant

## 2021-05-22 VITALS — BP 124/72 | HR 73 | Temp 98.0°F | Ht 68.0 in | Wt 170.2 lb

## 2021-05-22 DIAGNOSIS — L03313 Cellulitis of chest wall: Secondary | ICD-10-CM

## 2021-05-22 MED ORDER — CEPHALEXIN 500 MG PO CAPS: 500.0000 mg | ORAL_CAPSULE | Freq: Three times a day (TID) | ORAL | 0 refills | Status: AC

## 2021-05-22 NOTE — Progress Notes (Signed)
Acute Office Visit  Subjective:    Patient ID: [name], male    DOB: [birth year], 75 y.o.   MRN: [id number]  Chief Complaint  Patient presents with   Cyst    HPI Patient is in today for possible skin infection.  Patient had a lesion removed from his chest wall on 05/16/2021 via shave biopsy.  He is now having pain and redness around the area.  He denies any fever, pus, chills.  Past Medical History:  Diagnosis Date   Abscess    right posterior neck   Anticoagulation adequate 06/07/2019   Overview:  SSS, paroxAFib/Flutter, CHADSvasc= 3 age, h/o cva; chronic [place] Xarelto   Atrial fibrillation and flutter ([place]) 01/01/2015   Noted 2016. Xarelto    BPH associated with nocturia 08/14/2014   Finasteride, flomax.  0-3x nocturia. PSA to 14-->eval urology [facility]. PSA trended back down to about 2.6 per records in 2015 then placed on finasteride- had significant voiding issues when spiked to 14- improved on meds    Bradycardia    CAD (coronary artery disease) 05/26/2016   Coronary CTA_ 50-75% D1 off CT report    Cardiac pacemaker in situ 12/28/2009   SA node dysfunction as cause    Chest pressure 12/01/2015   Overview:  atypical CP, [facility] nuclear perfusion imaging w/ no inarct, no ischemia, EF-40% NICM   Chronic systolic heart failure ([place]) 12/01/2015   Overview:  [facility] nuclear perfusion imaging study, no inarct, no ischemia, EF-40% NICM   CVA (cerebral vascular accident) ([place])    Double vision 07/04/2018   Elevated LFTs    Erectile dysfunction 01/30/2015   cialis prn    External hemorrhoids    Fitting or adjustment of cardiac pacemaker 06/07/2019   Overview:  [place] [id number], RA and RV leads [id number]:  [id number] and [id number]   Former smoker 01/30/2015   Former smoker. 20 pack years quit 92. Patient declined AAA screen.     GERD (gastroesophageal reflux disease)    Gout 11/11/2009   Qualifier: Diagnosis of  By: Lynden Ang     History of nuclear stress test 06/07/2019    Overview:  [facility], no inarct, no ischemia, EF-40% NICM   HTN (hypertension)    Hyperglycemia 06/10/2017   110s CBG 2018   Hyperlipidemia 07/12/2007   Crestor 20mg  daily with some myalgias; fish oil. Trig 200-500 despite this. LDL ok.        Insomnia 01/30/2015   ambien 5mg  prn. May refill.     Ischemic optic neuropathy    Left chest pressure 03/19/2015   Other and unspecified hyperlipidemia    Sinoatrial node dysfunction (HCC)    Spinal stenosis    Trigger finger, right middle finger 03/01/2018    Past Surgical History:  Procedure Laterality Date   ABSCESS DRAINAGE     righ tposterior neck   CARDIAC CATHETERIZATION  1996   CHOLECYSTECTOMY  02/07/2003   COLONOSCOPY  06/23/2004   EYE SURGERY     left eye   left ingunial hernia  06/08/2011   PACEMAKER INSERTION     right hip replacement     2012 Dr. Maureen Ralphs    Family History  Problem Relation Age of Onset   Lung cancer Mother        mets to brain   Heart attack Mother 70   Hypertension Mother    Brain cancer Mother    Heart attack Father        mid 53s, smoker.  Diabetes Father    Cancer Brother    Diabetes Paternal Grandfather    Healthy Brother    Colon cancer Neg Hx    Esophageal cancer Neg Hx    Stomach cancer Neg Hx    Rectal cancer Neg Hx     Social History   Socioeconomic History   Marital status: Married    Spouse name: Not on file   Number of children: 1   Years of education: 20   Highest education level: Not on file  Occupational History   Not on file  Tobacco Use   Smoking status: Former    Packs/day: 1.00    Years: 20.00    Pack years: 20.00    Types: Cigarettes    Quit date: 11/04/1991    Years since quitting: 29.5   Smokeless tobacco: Never  Vaping Use   Vaping Use: Never used  Substance and Sexual Activity   Alcohol use: Yes    Alcohol/week: 0.0 standard drinks    Comment: occ   Drug use: No   Sexual activity: Not on file  Other Topics Concern   Not on file  Social History Narrative    Married. 1 child and 1 step child. 2 grandchildren.       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      Hobbies: swimming (states not fun), travel      Lives in a single story home   Social Determinants of Health   Financial Resource Strain: Not on file  Food Insecurity: Not on file  Transportation Needs: Not on file  Physical Activity: Not on file  Stress: Not on file  Social Connections: Not on file  Intimate Partner Violence: Not on file    Outpatient Medications Prior to Visit  Medication Sig Dispense Refill   benazepril (LOTENSIN) 5 MG tablet Take 1 tablet by mouth once daily 90 tablet 0   cyclobenzaprine (FLEXERIL) 5 MG tablet Take 1 tablet (5 mg total) by mouth 3 (three) times daily as needed for muscle spasms. 30 tablet 2   finasteride (PROSCAR) 5 MG tablet Take 1 tablet (5 mg total) by mouth daily. 90 tablet 3   gemfibrozil (LOPID) 600 MG tablet TAKE 1 TABLET BY MOUTH TWICE DAILY BEFORE  A  MEAL 180 tablet 3   indomethacin (INDOCIN) 25 MG capsule Take 1 capsule (25 mg total) by mouth 2 (two) times daily as needed (pain). Take with a meal. 3 days maximun 30 capsule 0   metoprolol succinate (TOPROL-XL) 25 MG 24 hr tablet Take 1 tablet (25 mg total) by mouth daily. 90 tablet 3   Multiple Vitamin (MULTIVITAMIN) tablet Take 1 tablet by mouth daily.     omeprazole (PRILOSEC) 20 MG capsule Take 1 capsule by mouth once daily 90 capsule 0   rosuvastatin (CRESTOR) 40 MG tablet Take 1 tablet (40 mg total) by mouth daily. 90 tablet 3   sildenafil (VIAGRA) 100 MG tablet Take 1 tablet (100 mg total) by mouth daily as needed for erectile dysfunction. 10 tablet 11   tadalafil (CIALIS) 5 MG tablet Take 1 tablet (5 mg total) by mouth daily as needed for erectile dysfunction. 90 tablet 3   XARELTO 20 MG TABS tablet TAKE 1 TABLET BY MOUTH ONCE DAILY WITH SUPPER 90 tablet 2   zolpidem (AMBIEN) 5 MG tablet TAKE 1 TABLET BY MOUTH AT BEDTIME 90 tablet 1   No facility-administered medications prior to visit.     No Known Allergies  Review of Systems REFER  TO HPI FOR PERTINENT POSITIVES AND NEGATIVES     Objective:    Physical Exam Vitals and nursing note reviewed.  Constitutional:      General: He is not in acute distress.    Appearance: He is not ill-appearing or toxic-appearing.  Skin:    Findings: Lesion (RIGHT CHEST WALL: LARGE LESION HEALING BY SECONDARY INTENTION. SURROUNDING ERYTHEMA AND EDEMA WITH SOME TTP.) present.  Neurological:     Mental Status: He is alert.    BP 124/72   Pulse 73   Temp 98 F (36.7 C)   Ht 5\' 8"  (1.727 m)   Wt 170 lb 3.2 oz (77.2 kg)   SpO2 98%   BMI 25.88 kg/m  Wt Readings from Last 3 Encounters:  05/22/21 170 lb 3.2 oz (77.2 kg)  05/16/21 168 lb 12.8 oz (76.6 kg)  05/15/21 170 lb 6.4 oz (77.3 kg)    There are no preventive care reminders to display for this patient.  There are no preventive care reminders to display for this patient.   Lab Results  Component Value Date   TSH 3.84 04/19/2019   Lab Results  Component Value Date   WBC 7.5 08/01/2020   HGB 14.5 08/01/2020   HCT 43.3 08/01/2020   MCV 88.5 08/01/2020   PLT 276 08/01/2020   Lab Results  Component Value Date   NA 140 08/01/2020   K 4.4 08/01/2020   CO2 27 08/01/2020   GLUCOSE 119 (H) 08/01/2020   BUN 17 08/01/2020   CREATININE 1.29 (H) 08/01/2020   BILITOT 0.7 08/01/2020   ALKPHOS 78 11/16/2019   AST 18 08/01/2020   ALT 13 08/01/2020   PROT 6.9 08/01/2020   ALBUMIN 4.5 11/16/2019   CALCIUM 9.7 08/01/2020   GFR 61.70 11/16/2019   Lab Results  Component Value Date   CHOL 136 08/01/2020   Lab Results  Component Value Date   HDL 40 08/01/2020   Lab Results  Component Value Date   LDLCALC 73 08/01/2020   Lab Results  Component Value Date   TRIG 149 08/01/2020   Lab Results  Component Value Date   CHOLHDL 3.4 08/01/2020   Lab Results  Component Value Date   HGBA1C 5.8 (H) 08/01/2020       Assessment & Plan:   Problem List Items Addressed  This Visit   None  1. Cellulitis of chest wall I do think the skin lesion where the biopsy was done is showing early signs of infection.  He will take cephalexin 500 mg 3 times daily for 5 days.  He will wash with soap and warm water and pat dry.  He will keep the lesion covered with Vaseline and a Band-Aid.  He is going to monitor for any worsening signs including fever, chills, pus, increasing redness.  He will recheck as needed.  This note was prepared with assistance of Systems analyst. Occasional wrong-word or sound-a-like substitutions may have occurred due to the inherent limitations of voice recognition software.   M , PA-C

## 2021-05-22 NOTE — Patient Instructions (Signed)
I have sent cephalexin to take as directed for skin infection. Please continue to wash with soap and warm water, pat dry. Cover with Vaseline and bandage. The wound will continue to heal from the inside out.  If redness, puss, or fever, please call for recheck.

## 2021-05-26 ENCOUNTER — Encounter: Payer: Self-pay | Admitting: Family Medicine

## 2021-06-20 ENCOUNTER — Encounter: Payer: Self-pay | Admitting: Internal Medicine

## 2021-06-20 ENCOUNTER — Ambulatory Visit: Payer: Medicare PPO | Admitting: Internal Medicine

## 2021-06-20 ENCOUNTER — Other Ambulatory Visit: Payer: Self-pay

## 2021-06-20 VITALS — BP 106/60 | HR 85 | Ht 68.0 in | Wt 172.6 lb

## 2021-06-20 DIAGNOSIS — Z950 Presence of cardiac pacemaker: Secondary | ICD-10-CM

## 2021-06-20 DIAGNOSIS — I495 Sick sinus syndrome: Secondary | ICD-10-CM

## 2021-06-20 DIAGNOSIS — I428 Other cardiomyopathies: Secondary | ICD-10-CM | POA: Diagnosis not present

## 2021-06-20 DIAGNOSIS — I480 Paroxysmal atrial fibrillation: Secondary | ICD-10-CM

## 2021-06-20 NOTE — Progress Notes (Signed)
HPI [name] returns today for followup. He is a pleasant 75 yo man with sinus node dysfunction, s/p PPM insertion. He has non-obstructive CAD. He has dyslipidemia and is on statin therapy. In the interim, he notes he feels well though he has not been swimming lately as he has been out of town.  He denies palpitations. He notes that his HR gets into the 120 range when he swims.  No Known Allergies   Current Outpatient Medications  Medication Sig Dispense Refill   benazepril (LOTENSIN) 5 MG tablet Take 1 tablet by mouth once daily 90 tablet 0   cyclobenzaprine (FLEXERIL) 5 MG tablet Take 1 tablet (5 mg total) by mouth 3 (three) times daily as needed for muscle spasms. 30 tablet 2   finasteride (PROSCAR) 5 MG tablet Take 1 tablet (5 mg total) by mouth daily. 90 tablet 3   gemfibrozil (LOPID) 600 MG tablet TAKE 1 TABLET BY MOUTH TWICE DAILY BEFORE  A  MEAL 180 tablet 3   indomethacin (INDOCIN) 25 MG capsule Take 1 capsule (25 mg total) by mouth 2 (two) times daily as needed (pain). Take with a meal. 3 days maximun 30 capsule 0   metoprolol succinate (TOPROL-XL) 25 MG 24 hr tablet Take 1 tablet (25 mg total) by mouth daily. 90 tablet 3   Multiple Vitamin (MULTIVITAMIN) tablet Take 1 tablet by mouth daily.     omeprazole (PRILOSEC) 20 MG capsule Take 1 capsule by mouth once daily 90 capsule 0   rosuvastatin (CRESTOR) 40 MG tablet Take 1 tablet (40 mg total) by mouth daily. 90 tablet 3   tadalafil (CIALIS) 5 MG tablet Take 1 tablet (5 mg total) by mouth daily as needed for erectile dysfunction. 90 tablet 3   XARELTO 20 MG TABS tablet TAKE 1 TABLET BY MOUTH ONCE DAILY WITH SUPPER 90 tablet 2   zolpidem (AMBIEN) 5 MG tablet TAKE 1 TABLET BY MOUTH AT BEDTIME 90 tablet 1   No current facility-administered medications for this visit.     Past Medical History:  Diagnosis Date   Abscess    right posterior neck   Anticoagulation adequate 06/07/2019   Overview:  SSS, paroxAFib/Flutter,  CHADSvasc= 3 age, h/o cva; chronic [place] Xarelto   Atrial fibrillation and flutter ([place]) 01/01/2015   Noted 2016. Xarelto    BPH associated with nocturia 08/14/2014   Finasteride, flomax.  0-3x nocturia. PSA to 14-->eval urology [facility]. PSA trended back down to about 2.6 per records in 2015 then placed on finasteride- had significant voiding issues when spiked to 14- improved on meds    Bradycardia    CAD (coronary artery disease) 05/26/2016   Coronary CTA_ 50-75% D1 off CT report    Cardiac pacemaker in situ 12/28/2009   SA node dysfunction as cause    Chest pressure 12/01/2015   Overview:  atypical CP, [facility] nuclear perfusion imaging w/ no inarct, no ischemia, EF-40% NICM   Chronic systolic heart failure ([place]) 12/01/2015   Overview:  [facility] nuclear perfusion imaging study, no inarct, no ischemia, EF-40% NICM   CVA (cerebral vascular accident) ([place])    Double vision 07/04/2018   Elevated LFTs    Erectile dysfunction 01/30/2015   cialis prn    External hemorrhoids    Fitting or adjustment of cardiac pacemaker 06/07/2019   Overview:  [place] [id number], RA and RV leads [id number] and [id number]   Former smoker 01/30/2015   Former smoker. 20 pack years quit  92. Patient declined AAA screen.     GERD (gastroesophageal reflux disease)    Gout 11/11/2009   Qualifier: Diagnosis of  By: Lynden Ang     History of nuclear stress test 06/07/2019   Overview:  [facility], no inarct, no ischemia, EF-40% NICM   HTN (hypertension)    Hyperglycemia 06/10/2017   110s CBG 2018   Hyperlipidemia 07/12/2007   Crestor '20mg'$  daily with some myalgias; fish oil. Trig 200-500 despite this. LDL ok.        Insomnia 01/30/2015   ambien '5mg'$  prn. May refill.     Ischemic optic neuropathy    Left chest pressure 03/19/2015   Other and unspecified hyperlipidemia    Sinoatrial node dysfunction (HCC)    Spinal stenosis    Trigger finger, right middle finger 03/01/2018    ROS:   All systems reviewed and negative except as  noted in the HPI.   Past Surgical History:  Procedure Laterality Date   ABSCESS DRAINAGE     righ tposterior neck   CARDIAC CATHETERIZATION  1996   CHOLECYSTECTOMY  02/07/2003   COLONOSCOPY  06/23/2004   EYE SURGERY     left eye   left ingunial hernia  06/08/2011   PACEMAKER INSERTION     right hip replacement     2012 Dr. Maureen Ralphs     Family History  Problem Relation Age of Onset   Lung cancer Mother        mets to brain   Heart attack Mother 30   Hypertension Mother    Brain cancer Mother    Heart attack Father        mid 52s, smoker.    Diabetes Father    Cancer Brother    Diabetes Paternal Grandfather    Healthy Brother    Colon cancer Neg Hx    Esophageal cancer Neg Hx    Stomach cancer Neg Hx    Rectal cancer Neg Hx      Social History   Socioeconomic History   Marital status: Married    Spouse name: Not on file   Number of children: 1   Years of education: 20   Highest education level: Not on file  Occupational History   Not on file  Tobacco Use   Smoking status: Former    Packs/day: 1.00    Years: 20.00    Pack years: 20.00    Types: Cigarettes    Quit date: 11/04/1991    Years since quitting: 29.6   Smokeless tobacco: Never  Vaping Use   Vaping Use: Never used  Substance and Sexual Activity   Alcohol use: Yes    Alcohol/week: 0.0 standard drinks    Comment: occ   Drug use: No   Sexual activity: Not on file  Other Topics Concern   Not on file  Social History Narrative   Married. 1 child and 1 step child. 2 grandchildren.       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      Hobbies: swimming (states not fun), travel      Lives in a single story home   Social Determinants of Health   Financial Resource Strain: Not on file  Food Insecurity: Not on file  Transportation Needs: Not on file  Physical Activity: Not on file  Stress: Not on file  Social Connections: Not on file  Intimate Partner Violence: Not on file     BP 106/60   Pulse 85   Ht  '5\' 8"'$  (  1.727 m)   Wt 172 lb 9.6 oz (78.3 kg)   SpO2 95%   BMI 26.24 kg/m   Physical Exam:  Well appearing 75 yo man, NAD HEENT: Unremarkable Neck:  No JVD, no thyromegally Lymphatics:  No adenopathy Back:  No CVA tenderness Lungs:  Clear with no wheezes HEART:  Regular rate rhythm, no murmurs, no rubs, no clicks Abd:  soft, positive bowel sounds, no organomegally, no rebound, no guarding Ext:  2 plus pulses, no edema, no cyanosis, no clubbing Skin:  No rashes no nodules Neuro:  CN II through XII intact, motor grossly intact  EKG - NSR with AV pacing  DEVICE  Normal device function.  See PaceArt for details.   Assess/Plan:  1.  Sinus node dysfunction -he has no underlying escape rhythm today.  He is stable status post pacemaker insertion. He appears to have normal chronotropic responsed to exercise. 2.  Pacemaker -his Biotronik dual-chamber pacemaker is working normally.  His histograms look good.  He will be rechecked in several months. 3.  Coronary artery disease -he denies anginal symptoms.  He has nonobstructive disease except for some branch vessel disease for which she is asymptomatic despite exercising vigorously.  He will undergo watchful waiting.  No change in his medications. 4.  Dyslipidemia -he has a preponderance of hypertriglyceridemia.  He is on multiple medications which he will continue.   Carleene Overlie ,MD

## 2021-06-20 NOTE — Patient Instructions (Addendum)
Medication Instructions:  °Your physician recommends that you continue on your current medications as directed. Please refer to the Current Medication list given to you today. ° °Labwork: °None ordered. ° °Testing/Procedures: °None ordered. ° °Follow-Up: °Your physician wants you to follow-up in: 6 months with Gregg Taylor, MD  ° ° °Any Other Special Instructions Will Be Listed Below (If Applicable). ° °If you need a refill on your cardiac medications before your next appointment, please call your pharmacy.  ° ° ° ° °

## 2021-06-23 DIAGNOSIS — Q231 Congenital insufficiency of aortic valve: Secondary | ICD-10-CM | POA: Diagnosis not present

## 2021-06-23 DIAGNOSIS — I25118 Atherosclerotic heart disease of native coronary artery with other forms of angina pectoris: Secondary | ICD-10-CM | POA: Diagnosis not present

## 2021-06-23 DIAGNOSIS — I428 Other cardiomyopathies: Secondary | ICD-10-CM | POA: Diagnosis not present

## 2021-06-23 DIAGNOSIS — I480 Paroxysmal atrial fibrillation: Secondary | ICD-10-CM | POA: Diagnosis not present

## 2021-08-13 DIAGNOSIS — Z20822 Contact with and (suspected) exposure to covid-19: Secondary | ICD-10-CM | POA: Diagnosis not present

## 2021-08-15 ENCOUNTER — Encounter: Payer: Self-pay | Admitting: Family Medicine

## 2021-08-18 ENCOUNTER — Other Ambulatory Visit: Payer: Self-pay

## 2021-08-18 DIAGNOSIS — Z0000 Encounter for general adult medical examination without abnormal findings: Secondary | ICD-10-CM

## 2021-08-18 DIAGNOSIS — I10 Essential (primary) hypertension: Secondary | ICD-10-CM

## 2021-08-18 DIAGNOSIS — M10072 Idiopathic gout, left ankle and foot: Secondary | ICD-10-CM

## 2021-08-18 DIAGNOSIS — R739 Hyperglycemia, unspecified: Secondary | ICD-10-CM

## 2021-08-29 ENCOUNTER — Other Ambulatory Visit: Payer: Self-pay | Admitting: Family Medicine

## 2021-09-01 ENCOUNTER — Telehealth: Payer: Self-pay

## 2021-09-01 MED ORDER — FINASTERIDE 5 MG PO TABS: 5.0000 mg | ORAL_TABLET | Freq: Every day | ORAL | 3 refills | Status: DC

## 2021-09-01 NOTE — Telephone Encounter (Signed)
Refill sent to pharmacy.   

## 2021-09-04 DIAGNOSIS — I088 Other rheumatic multiple valve diseases: Secondary | ICD-10-CM | POA: Diagnosis not present

## 2021-09-04 DIAGNOSIS — Z950 Presence of cardiac pacemaker: Secondary | ICD-10-CM | POA: Diagnosis not present

## 2021-09-04 DIAGNOSIS — R931 Abnormal findings on diagnostic imaging of heart and coronary circulation: Secondary | ICD-10-CM | POA: Diagnosis not present

## 2021-09-04 DIAGNOSIS — Q231 Congenital insufficiency of aortic valve: Secondary | ICD-10-CM | POA: Diagnosis not present

## 2021-09-08 NOTE — Progress Notes (Signed)
Appt moved to next day

## 2021-09-15 ENCOUNTER — Ambulatory Visit: Payer: Medicare PPO | Admitting: Family Medicine

## 2021-09-15 ENCOUNTER — Other Ambulatory Visit: Payer: Self-pay

## 2021-09-15 DIAGNOSIS — R739 Hyperglycemia, unspecified: Secondary | ICD-10-CM

## 2021-09-15 DIAGNOSIS — K219 Gastro-esophageal reflux disease without esophagitis: Secondary | ICD-10-CM

## 2021-09-15 DIAGNOSIS — M10072 Idiopathic gout, left ankle and foot: Secondary | ICD-10-CM

## 2021-09-15 DIAGNOSIS — I10 Essential (primary) hypertension: Secondary | ICD-10-CM

## 2021-09-15 DIAGNOSIS — G4700 Insomnia, unspecified: Secondary | ICD-10-CM

## 2021-09-15 DIAGNOSIS — N401 Enlarged prostate with lower urinary tract symptoms: Secondary | ICD-10-CM

## 2021-09-15 DIAGNOSIS — M069 Rheumatoid arthritis, unspecified: Secondary | ICD-10-CM

## 2021-09-16 ENCOUNTER — Encounter: Payer: Self-pay | Admitting: Family Medicine

## 2021-09-16 ENCOUNTER — Ambulatory Visit: Payer: Medicare PPO | Admitting: Family Medicine

## 2021-09-16 VITALS — BP 120/84 | HR 70 | Temp 97.3°F | Ht 68.0 in | Wt 162.2 lb

## 2021-09-16 DIAGNOSIS — I4892 Unspecified atrial flutter: Secondary | ICD-10-CM

## 2021-09-16 DIAGNOSIS — E785 Hyperlipidemia, unspecified: Secondary | ICD-10-CM

## 2021-09-16 DIAGNOSIS — I4891 Unspecified atrial fibrillation: Secondary | ICD-10-CM | POA: Diagnosis not present

## 2021-09-16 DIAGNOSIS — I2510 Atherosclerotic heart disease of native coronary artery without angina pectoris: Secondary | ICD-10-CM

## 2021-09-16 DIAGNOSIS — I700 Atherosclerosis of aorta: Secondary | ICD-10-CM

## 2021-09-16 DIAGNOSIS — R351 Nocturia: Secondary | ICD-10-CM

## 2021-09-16 DIAGNOSIS — N401 Enlarged prostate with lower urinary tract symptoms: Secondary | ICD-10-CM

## 2021-09-16 DIAGNOSIS — R739 Hyperglycemia, unspecified: Secondary | ICD-10-CM | POA: Diagnosis not present

## 2021-09-16 DIAGNOSIS — I10 Essential (primary) hypertension: Secondary | ICD-10-CM | POA: Diagnosis not present

## 2021-09-16 DIAGNOSIS — M10072 Idiopathic gout, left ankle and foot: Secondary | ICD-10-CM

## 2021-09-16 DIAGNOSIS — I5022 Chronic systolic (congestive) heart failure: Secondary | ICD-10-CM | POA: Diagnosis not present

## 2021-09-16 LAB — COMPREHENSIVE METABOLIC PANEL
ALT: 13 U/L (ref 0–53)
AST: 18 U/L (ref 0–37)
Albumin: 4.7 g/dL (ref 3.5–5.2)
Alkaline Phosphatase: 80 U/L (ref 39–117)
BUN: 19 mg/dL (ref 6–23)
CO2: 26 mEq/L (ref 19–32)
Calcium: 9.8 mg/dL (ref 8.4–10.5)
Chloride: 103 mEq/L (ref 96–112)
Creatinine, Ser: 1.16 mg/dL (ref 0.40–1.50)
GFR: 61.87 mL/min (ref >60.00)
Glucose, Bld: 103 mg/dL — ABNORMAL HIGH (ref 70–99)
Potassium: 4.6 mEq/L (ref 3.5–5.1)
Sodium: 140 mEq/L (ref 135–145)
Total Bilirubin: 0.8 mg/dL (ref 0.2–1.2)
Total Protein: 7.2 g/dL (ref 6.0–8.3)

## 2021-09-16 LAB — LIPID PANEL
Cholesterol: 137 mg/dL (ref 0–200)
HDL: 42.4 mg/dL (ref >39.00)
LDL Cholesterol: 57 mg/dL (ref 0–99)
NonHDL: 94.99
Total CHOL/HDL Ratio: 3
Triglycerides: 189 mg/dL — ABNORMAL HIGH (ref 0.0–149.0)
VLDL: 37.8 mg/dL (ref 0.0–40.0)

## 2021-09-16 LAB — CBC WITH DIFFERENTIAL/PLATELET
Basophils Absolute: 0.1 10*3/uL (ref 0.0–0.1)
Basophils Relative: 0.7 % (ref 0.0–3.0)
Eosinophils Absolute: 0.2 10*3/uL (ref 0.0–0.7)
Eosinophils Relative: 3.2 % (ref 0.0–5.0)
HCT: 44.5 % (ref 39.0–52.0)
Hemoglobin: 14.9 g/dL (ref 13.0–17.0)
Lymphocytes Relative: 30 % (ref 12.0–46.0)
Lymphs Abs: 2.2 10*3/uL (ref 0.7–4.0)
MCHC: 33.4 g/dL (ref 30.0–36.0)
MCV: 88.8 fl (ref 78.0–100.0)
Monocytes Absolute: 0.6 10*3/uL (ref 0.1–1.0)
Monocytes Relative: 8.2 % (ref 3.0–12.0)
Neutro Abs: 4.3 10*3/uL (ref 1.4–7.7)
Neutrophils Relative %: 57.9 % (ref 43.0–77.0)
Platelets: 269 10*3/uL (ref 150.0–400.0)
RBC: 5.02 Mil/uL (ref 4.22–5.81)
RDW: 14.5 % (ref 11.5–15.5)
WBC: 7.4 10*3/uL (ref 4.0–10.5)

## 2021-09-16 LAB — HEMOGLOBIN A1C: Hgb A1c MFr Bld: 5.6 % (ref 4.6–6.5)

## 2021-09-16 LAB — PSA: PSA: 2.39 ng/mL (ref 0.10–4.00)

## 2021-09-16 LAB — URIC ACID: Uric Acid, Serum: 6.8 mg/dL (ref 4.0–7.8)

## 2021-09-16 NOTE — Progress Notes (Signed)
Phone [phone] In person visit   Subjective:   [name] is a 75 y.o. year old very pleasant male patient who presents for/with See problem oriented charting Chief Complaint  Patient presents with   Hypertension   Hyperlipidemia   Hyperglycemia    This visit occurred during the SARS-CoV-2 public health emergency.  Safety protocols were in place, including screening questions prior to the visit, additional usage of staff PPE, and extensive cleaning of exam room while observing appropriate contact time as indicated for disinfecting solutions.   Past Medical History-  Patient Active Problem List   Diagnosis Date Noted   Anticoagulation adequate 06/07/2019    Priority: 3.   Convergence insufficiency 06/07/2019    Priority: 3.   Fitting or adjustment of cardiac pacemaker 06/07/2019    Priority: 3.   Pacemaker-dependent due to native cardiac rhythm insufficient to support life 06/07/2019    Priority: 3.   Palpitations 06/07/2019    Priority: 3.   History of nuclear stress test 06/07/2019    Priority: 3.   Non-occlusive coronary artery disease 06/07/2019    Priority: 3.   Trigger finger, right middle finger 03/01/2018    Priority: 3.   Solitary pulmonary nodule 05/26/2016    Priority: 3.   Chest pressure 12/01/2015    Priority: 3.   Left chest pressure 03/19/2015    Priority: 3.   Erectile dysfunction 01/30/2015    Priority: 3.   Former smoker 01/30/2015    Priority: 3.   Allergic rhinitis 10/11/2014    Priority: 3.   Sinoatrial node dysfunction (HCC)     Priority: 3.   SPINAL STENOSIS, LUMBAR 08/22/2010    Priority: 3.   GERD 03/17/2010    Priority: 3.   S/P cardiac cath 06/07/2019    Priority: High   CAD (coronary artery disease) 05/26/2016    Priority: High   Nonischemic cardiomyopathy ([place]) 04/23/2016    Priority: High   Chronic systolic heart failure ([place]) 12/01/2015    Priority: High   CVA (cerebral infarction) 01/30/2015    Priority: High    Atrial fibrillation and flutter ([place]) 01/01/2015    Priority: High   Ventricular tachycardia, non-sustained 01/25/2013    Priority: High   Cardiac pacemaker in situ 12/28/2009    Priority: High   Aortic atherosclerosis ([place]) 08/01/2020    Priority: Medium    Fatty liver 08/01/2020    Priority: Medium    Double vision 07/04/2018    Priority: Medium    Hyperglycemia 06/10/2017    Priority: Medium    Insomnia 01/30/2015    Priority: Medium    BPH associated with nocturia 08/14/2014    Priority: Medium    Gout 11/11/2009    Priority: Medium    Hyperlipidemia 07/12/2007    Priority: Medium    NEUROPATHY, ISCHEMIC OPTIC 06/02/2007    Priority: Medium    Essential hypertension 06/02/2007    Priority: Medium    Paroxysmal atrial fibrillation ([place]) 07/27/2019    Medications- reviewed and updated Current Outpatient Medications  Medication Sig Dispense Refill   benazepril (LOTENSIN) 5 MG tablet Take 1 tablet by mouth once daily 90 tablet 0   cyclobenzaprine (FLEXERIL) 5 MG tablet Take 1 tablet (5 mg total) by mouth 3 (three) times daily as needed for muscle spasms. 30 tablet 2   finasteride (PROSCAR) 5 MG tablet Take 1 tablet (5 mg total) by mouth daily. 90 tablet 3   gemfibrozil (LOPID) 600 MG tablet TAKE 1 TABLET BY  MOUTH TWICE DAILY BEFORE  A  MEAL 180 tablet 3   indomethacin (INDOCIN) 25 MG capsule Take 1 capsule (25 mg total) by mouth 2 (two) times daily as needed (pain). Take with a meal. 3 days maximun 30 capsule 0   metoprolol succinate (TOPROL-XL) 25 MG 24 hr tablet Take 1 tablet (25 mg total) by mouth daily. 90 tablet 3   Multiple Vitamin (MULTIVITAMIN) tablet Take 1 tablet by mouth daily.     omeprazole (PRILOSEC) 20 MG capsule Take 1 capsule by mouth once daily 90 capsule 0   rosuvastatin (CRESTOR) 40 MG tablet Take 1 tablet (40 mg total) by mouth daily. 90 tablet 3   tadalafil (CIALIS) 5 MG tablet Take 1 tablet (5 mg total) by mouth daily as needed for erectile dysfunction.  90 tablet 3   XARELTO 20 MG TABS tablet TAKE 1 TABLET BY MOUTH ONCE DAILY WITH SUPPER 90 tablet 2   zolpidem (AMBIEN) 5 MG tablet TAKE 1 TABLET BY MOUTH AT BEDTIME 90 tablet 1   No current facility-administered medications for this visit.     Objective:  BP 120/84   Pulse 70   Temp (!) 97.3 F (36.3 C) (Temporal)   Ht 5\' 8"  (1.727 m)   Wt 162 lb 3.2 oz (73.6 kg)   SpO2 98%   BMI 24.66 kg/m  Gen: NAD, resting comfortably CV: RRR no murmurs rubs or gallops Lungs: CTAB no crackles, wheeze, rhonchi Ext: no edema Skin: warm, dry, 2 x 2 centimeter soft mobile lesion subcutaneous over the left lateral chest wall/ribs     Assessment and Plan  #Imaging questions from [place] had an X-ray of the chest - PA and lateral done on 09/04/2021. Impression showed "left subclavian transvenous pacemaker that was unchanged with leads terminating in the right atrial appendage and right ventricle.The lungs were clear. No pleural effusion or pneumothorax was noted. Normal heart and mediastinum."  Patient sinus the following message "dear Dr. Yong Channel, Last week I had a cardiac MRI and a chest x-ray at [facility]. At my appointment next week, if possible, I would like to review those with you regarding an intestinal blockage noted and the chest x-ray which did not mention the spot on my lung found in prior exams. Thanks so much, [name]"  A/P: Patient with prior pulmonary nodule 5 mm last noted 2020 chest CT-no further imaging was suggested.  Patient asked if this was visible on x-ray-discussed that this size may not be visible on x-ray but could certainly still be present.  No further work-up is needed at this time-actually reassuring not seen on chest x-ray  Patient also noted a question about possible abdominal blockage on the cardiac MRI-we both spent extensive time together looking through both my chart and care everywhere and were unable to locate these comments-he is going to look again at  home and message me with a screenshot and then I will be able to comment.  In the past has had some intermittent left upper quadrant pain and just wants to make sure we do not need to investigate further.  Prior CT abdomen pelvis in 2020 related to this pain - Also has over left rib cage 1 appears to be a lipoma probably 2 x 2 cm-this is not painful or rapidly growing-can continue to monitor this  # intermittent bleeding in mouth- mixed in with saliva- notes the taste. Still on xarelto. No coughing up or throwing up blood. Could be related to teeth -  encouraged follow up with dentist (over a year).  #left lateral hip pain (titanium hip on right side)- 2 days ago was having a lot of trouble walking due to the pain - worse with laying on that side or trying to move the leg even in bed, or put pants on. Feels like leg may give way but it doest. Standing up to 3/10 today- with moving around 2 days ago was 7-8/10. Took indomethacin and helped- advil didn't help as much. Has not iced it -Apparently has had some intermittent issues in the past.  On exam somewhat tight in the hips but overall normal range of motion without pain.  No back pain on exam.  Some pain over her greater trochanter but not great enough to call this trochanteric bursitis -Offered sports medicine referral but he declines for now  #Tachycardia-HR up to 120s last night- pacemaker in place due to sinus node dysfunction. Checks in January. In bed last night- no chest pain or shortness of breath.  Largely asymptomatic-perhaps some mild anxiety-discussed scheduling a sooner visit with cardiology and having them read the pacemaker-he agrees consider.  We discussed could be recurrence of A. fib but he is already on Xarelto and since largely asymptomatic okay to monitor but follow-up with cardiology if recurs -Still swimming regularly and does well with this with no chest pain or shortness of breath.  We discussed could be recurrence  #CV  issues S:  1. Coronary artery disease-compliant with Xarelto-not on aspirin as a result of Xarelto 2. Hyperlipidemia-compliant with rosuvastatin 40 mgwith LDL goal 70 or less. Also takes gemfibrozil and vascepa from [facility] 3. Nonischemic cardiomyopathy/systolic heart failure -patient follows up with [facility]. Compliant with benazepril 5 mg metoprolol 25 mg extended release. Has not required Lasix  4. sinus node dysfunction with pacemaker in place 5. Atrial fibrillation and flutter-on chronic Xarelto and metoprolol 6. Aortic atherosclerosis-on statin and Xarelto  Still swimming regularly . No chest pain or shortness of breath.  A/P: CAD appears asymptomatic.  Continue current medications.  Not on aspirin since he is on Xarelto.  For hyperlipidemia-request updated labs-we will update with labs today -Also in addition to LDL goal 70 or less for CAD-similar goal for aortic atherosclerosis-continue risk factor modification/current medication  For nonischemic cardiomyopathy/systolic heart failure-appears stable without Lasix-on benazepril and metoprolol-continue medications  For sinus node dysfunctio as well as A. fib-see notes about tachycardia above.  Pacemaker in place to address sinus node dysfunction.  Metoprolol should address rate control for A. fib typically   #BPHPatient compliant with finasteride 5 mg daily. Previously on Flomax.  We did not review for any new urinary symptoms today  #Arthritis   Patient on indomethacin 25 mg twice daily for 3 days maximum per week with meal.  Warned of potential bleeding risk with this-particular GI bleeding risk  # Hyperglycemia/insulin resistance/prediabetes-peak A1c 5.9 S: Medication: None Exercise and diet-swimming regularly-try to eat a reasonably healthy diet Lab Results  Component Value Date   HGBA1C 5.8 (H) 08/01/2020   HGBA1C 5.9 04/19/2019   HGBA1C 5.4 04/17/2014  A/P: A1c has been elevated-continue to trend with A1c today-continue without  medication for now  #hypertension S: medication: Metoprolol 25 mg extended release daily, benazepril 5 mg dail BP Readings from Last 3 Encounters:  09/16/21 120/84  06/20/21 106/60  05/22/21 124/72  A/P: Reasonable control-continue current medication  #Gout-not on uric acid lowering agent-we will check uric acid again today.  If elevated will need to discuss if any recent  gout flares-did not have a chance to get into this today Lab Results  Component Value Date   LABURIC 7.4 08/01/2020   Recommended follow up: Encouraged him to keep February physical-he wants to consider pushing this back Future Appointments  Date Time Provider [place]  12/09/2021  2:00 PM Evans Lance, MD [facility]  01/26/2022  2:40 PM , Brayton Mars, MD [facility]    Lab/Order associations:   ICD-10-CM   1. Essential hypertension  I10     2. Aortic atherosclerosis (HCC) Chronic I70.0     3. BPH associated with nocturia  N40.1    R35.1     4. Hyperlipidemia, unspecified hyperlipidemia type  E78.5 CBC with Differential/Platelet    Comprehensive metabolic panel    Lipid Profile    5. Coronary artery disease involving native coronary artery of native heart without angina pectoris  I25.10     6. Atrial fibrillation and flutter (HCC)  I48.91    I48.92     7. Chronic systolic heart failure (HCC)  I50.22     8. Hyperglycemia  R73.9 Hemoglobin A1c    9. Acute idiopathic gout of left ankle  M10.072 Uric acid    10. Nocturia  R35.1 PSA      No orders of the defined types were placed in this encounter.   Time Spent: 45 minutes of total time (12:05 PM-12:45 PM, 1:50 PM-2:05 PM ) was spent on the date of the encounter performing the following actions: chart review prior to seeing the patient, obtaining history, performing a medically necessary exam, counseling on the treatment plan, placing orders, and documenting in our EHR.   I,Harris Phan,acting as a Education administrator for Garret Reddish, MD.,have documented all relevant documentation on the behalf of Garret Reddish, MD,as directed by  Garret Reddish, MD while in the presence of Garret Reddish, MD.   I, Garret Reddish, MD, have reviewed all documentation for this visit. The documentation on 09/16/21 for the exam, diagnosis, procedures, and orders are all accurate and complete.   Return precautions advised.  Garret Reddish, MD

## 2021-09-16 NOTE — Telephone Encounter (Signed)
FYI, see below.

## 2021-09-16 NOTE — Patient Instructions (Addendum)
Health Maintenance Due  Topic Date Due   Zoster Vaccines- Shingrix (1 of 2)  - Please check with your pharmacy to see if they have the shingrix vaccine. If they do- please get this immunization and update [place] by phone call or mychart with dates you receive the vaccine.  Never done   Schedule follow up with your dentist- have them check on bleeding issue in the mouth  If lateral hip worsens- let me do a referral to sports medicine or if recurs more frequently   Please stop by lab before you go If you have mychart- we will send your results within 3 business days of [place] receiving them.  If you do not have mychart- we will call you about results within 5 business days of [place] receiving them.  *please also note that you will see labs on mychart as soon as they post. I will later go in and write notes on them- will say "notes from Dr. Yong Channel"  Recommended follow up: keep February visit or sooner if needed

## 2021-09-17 ENCOUNTER — Other Ambulatory Visit: Payer: Self-pay

## 2021-09-17 DIAGNOSIS — R972 Elevated prostate specific antigen [PSA]: Secondary | ICD-10-CM

## 2021-10-05 ENCOUNTER — Other Ambulatory Visit: Payer: Self-pay | Admitting: Family Medicine

## 2021-10-07 ENCOUNTER — Other Ambulatory Visit (INDEPENDENT_AMBULATORY_CARE_PROVIDER_SITE_OTHER): Payer: Medicare PPO

## 2021-10-07 ENCOUNTER — Other Ambulatory Visit: Payer: Self-pay

## 2021-10-07 DIAGNOSIS — R972 Elevated prostate specific antigen [PSA]: Secondary | ICD-10-CM

## 2021-10-07 LAB — PSA: PSA: 1.71 ng/mL (ref 0.10–4.00)

## 2021-10-27 ENCOUNTER — Other Ambulatory Visit: Payer: Self-pay | Admitting: Family Medicine

## 2021-11-17 ENCOUNTER — Encounter: Payer: Medicare PPO | Admitting: Family Medicine

## 2021-12-01 ENCOUNTER — Other Ambulatory Visit: Payer: Self-pay | Admitting: Family Medicine

## 2021-12-02 ENCOUNTER — Other Ambulatory Visit: Payer: Self-pay | Admitting: Family Medicine

## 2021-12-09 ENCOUNTER — Ambulatory Visit: Payer: Medicare PPO | Admitting: Internal Medicine

## 2021-12-09 ENCOUNTER — Other Ambulatory Visit: Payer: Self-pay

## 2021-12-09 ENCOUNTER — Encounter: Payer: Self-pay | Admitting: Internal Medicine

## 2021-12-09 VITALS — BP 114/68 | HR 60 | Ht 68.0 in | Wt 168.0 lb

## 2021-12-09 DIAGNOSIS — Z950 Presence of cardiac pacemaker: Secondary | ICD-10-CM

## 2021-12-09 DIAGNOSIS — I495 Sick sinus syndrome: Secondary | ICD-10-CM

## 2021-12-09 DIAGNOSIS — I2510 Atherosclerotic heart disease of native coronary artery without angina pectoris: Secondary | ICD-10-CM

## 2021-12-09 DIAGNOSIS — E785 Hyperlipidemia, unspecified: Secondary | ICD-10-CM | POA: Diagnosis not present

## 2021-12-09 NOTE — Progress Notes (Signed)
HPI [name] returns today for followup. He is a pleasant 76 yo man with sinus node dysfunction, s/p PPM insertion. He has non-obstructive CAD. He has dyslipidemia and is on statin therapy. In the interim, he notes he feels well though he has not been swimming as much lately as he has been out of town.  He denies palpitations. He notes that his HR gets into the 120 range when he swims. Last night he was swimming and he notes that his pulse dropped into the 60's. He had to stop. He eventually felt better.   No Known Allergies   Current Outpatient Medications  Medication Sig Dispense Refill   benazepril (LOTENSIN) 5 MG tablet Take 1 tablet by mouth once daily 90 tablet 0   cyclobenzaprine (FLEXERIL) 5 MG tablet Take 1 tablet (5 mg total) by mouth 3 (three) times daily as needed for muscle spasms. 30 tablet 2   finasteride (PROSCAR) 5 MG tablet Take 1 tablet (5 mg total) by mouth daily. 90 tablet 3   gemfibrozil (LOPID) 600 MG tablet TAKE 1 TABLET BY MOUTH TWICE DAILY BEFORE  A  MEAL 180 tablet 3   icosapent Ethyl (VASCEPA) 1 g capsule Take 1 g by mouth 2 (two) times daily.     indomethacin (INDOCIN) 25 MG capsule Take 1 capsule (25 mg total) by mouth 2 (two) times daily as needed (pain). Take with a meal. 3 days maximun 30 capsule 0   metoprolol succinate (TOPROL-XL) 25 MG 24 hr tablet Take 1 tablet (25 mg total) by mouth daily. 90 tablet 3   Multiple Vitamin (MULTIVITAMIN) tablet Take 1 tablet by mouth daily.     omeprazole (PRILOSEC) 20 MG capsule Take 1 capsule by mouth once daily 90 capsule 0   rosuvastatin (CRESTOR) 40 MG tablet Take 1 tablet by mouth once daily 90 tablet 0   tadalafil (CIALIS) 5 MG tablet TAKE 1 TABLET BY MOUTH DAILY AS NEEDED FOR ERECTILE DYSFUNCTION 90 tablet 3   XARELTO 20 MG TABS tablet TAKE 1 TABLET BY MOUTH ONCE DAILY WITH SUPPER 90 tablet 2   zolpidem (AMBIEN) 5 MG tablet TAKE 1 TABLET BY MOUTH AT BEDTIME 90 tablet 0   No current facility-administered  medications for this visit.     Past Medical History:  Diagnosis Date   Abscess    right posterior neck   Anticoagulation adequate 06/07/2019   Overview:  SSS, paroxAFib/Flutter, CHADSvasc= 3 age, h/o cva; chronic [place] Xarelto   Atrial fibrillation and flutter ([place]) 01/01/2015   Noted 2016. Xarelto    BPH associated with nocturia 08/14/2014   Finasteride, flomax.  0-3x nocturia. PSA to 14-->eval urology [facility] PSA trended back down to about 2.6 per records in 2015 then placed on finasteride- had significant voiding issues when spiked to 14- improved on meds    Bradycardia    CAD (coronary artery disease) 05/26/2016   Coronary CTA_ 50-75% D1 off CT report    Cardiac pacemaker in situ 12/28/2009   SA node dysfunction as cause    Chest pressure 12/01/2015   Overview:  atypical CP, [facility] nuclear perfusion imaging w/ no inarct, no ischemia, EF-40% NICM   Chronic systolic heart failure ([place]) 12/01/2015   Overview:  [facility] nuclear perfusion imaging study, no inarct, no ischemia, EF-40% NICM   CVA (cerebral vascular accident) ([place])    Double vision 07/04/2018   Elevated LFTs    Erectile dysfunction 01/30/2015   cialis prn    External hemorrhoids  Fitting or adjustment of cardiac pacemaker 06/07/2019   Overview:  [place] [id number], RA and RV leads [id number]:  [id number] and [id number]   Former smoker 01/30/2015   Former smoker. 20 pack years quit 92. Patient declined AAA screen.     GERD (gastroesophageal reflux disease)    Gout 11/11/2009   Qualifier: Diagnosis of  By: Lynden Ang     History of nuclear stress test 06/07/2019   Overview:  [facility], no inarct, no ischemia, EF-40% NICM   HTN (hypertension)    Hyperglycemia 06/10/2017   110s CBG 2018   Hyperlipidemia 07/12/2007   Crestor 20mg  daily with some myalgias; fish oil. Trig 200-500 despite this. LDL ok.        Insomnia 01/30/2015   ambien 5mg  prn. May refill.     Ischemic optic neuropathy    Left chest pressure 03/19/2015   Other and  unspecified hyperlipidemia    Sinoatrial node dysfunction (HCC)    Spinal stenosis    Trigger finger, right middle finger 03/01/2018    ROS:   All systems reviewed and negative except as noted in the HPI.   Past Surgical History:  Procedure Laterality Date   ABSCESS DRAINAGE     righ tposterior neck   CARDIAC CATHETERIZATION  1996   CHOLECYSTECTOMY  02/07/2003   COLONOSCOPY  06/23/2004   EYE SURGERY     left eye   left ingunial hernia  06/08/2011   PACEMAKER INSERTION     right hip replacement     2012 Dr. Maureen Ralphs     Family History  Problem Relation Age of Onset   Lung cancer Mother        mets to brain   Heart attack Mother 78   Hypertension Mother    Brain cancer Mother    Heart attack Father        mid 107s, smoker.    Diabetes Father    Cancer Brother    Diabetes Paternal Grandfather    Healthy Brother    Colon cancer Neg Hx    Esophageal cancer Neg Hx    Stomach cancer Neg Hx    Rectal cancer Neg Hx      Social History   Socioeconomic History   Marital status: Married    Spouse name: Not on file   Number of children: 1   Years of education: 20   Highest education level: Not on file  Occupational History   Not on file  Tobacco Use   Smoking status: Former    Packs/day: 1.00    Years: 20.00    Pack years: 20.00    Types: Cigarettes    Quit date: 11/04/1991    Years since quitting: 30.1   Smokeless tobacco: Never  Vaping Use   Vaping Use: Never used  Substance and Sexual Activity   Alcohol use: Yes    Alcohol/week: 0.0 standard drinks    Comment: occ   Drug use: No   Sexual activity: Not on file  Other Topics Concern   Not on file  Social History Narrative   Married. 1 child and 1 step child. 2 grandchildren.       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      Hobbies: swimming (states not fun), travel      Lives in a single story home   Social Determinants of Health   Financial Resource Strain: Not on file  Food Insecurity: Not on file   Transportation Needs: Not on file  Physical  Activity: Not on file  Stress: Not on file  Social Connections: Not on file  Intimate Partner Violence: Not on file     BP 114/68    Pulse 60    Ht 5\' 8"  (1.727 m)    Wt 168 lb (76.2 kg)    SpO2 95%    BMI 25.54 kg/m   Physical Exam:  Well appearing NAD HEENT: Unremarkable Neck:  No JVD, no thyromegally Lymphatics:  No adenopathy Back:  No CVA tenderness Lungs:  Clear with no wheezes HEART:  Regular rate rhythm, no murmurs, no rubs, no clicks Abd:  soft, positive bowel sounds, no organomegally, no rebound, no guarding Ext:  2 plus pulses, no edema, no cyanosis, no clubbing Skin:  No rashes no nodules Neuro:  CN II through XII intact, motor grossly intact  DEVICE  Normal device function.  See PaceArt for details.   Assess/Plan:  1.  Sinus node dysfunction -he has no underlying escape rhythm today.  He is stable status post pacemaker insertion. He appears to have normal chronotropic responsed to exercise. I stongly suspect that the reduction in his pulse rate mentioned above was due to bigeminal PVC's. 2.  Pacemaker -his Biotronik dual-chamber pacemaker is working normally.  His histograms look good.  He will be rechecked in several months. 3.  Coronary artery disease -he denies anginal symptoms.  He has nonobstructive disease except for some branch vessel disease for which she is asymptomatic despite exercising vigorously.  He will undergo watchful waiting.  No change in his medications. 4.  Dyslipidemia -he has a preponderance of hypertriglyceridemia.  He is on multiple medications which he will continue.     Carleene Overlie ,MD

## 2021-12-09 NOTE — Patient Instructions (Signed)
Medication Instructions:  Your physician recommends that you continue on your current medications as directed. Please refer to the Current Medication list given to you today.  Labwork: None ordered.  Testing/Procedures: None ordered.  Follow-Up: Your physician wants you to follow-up in: 6 months with device clinic.   You will receive a reminder letter in the mail two months in advance. If you don't receive a letter, please call our office to schedule the follow-up appointment.  Your physician wants you to follow-up in: one year with Cristopher Peru, MD or one of the following Advanced Practice Providers on your designated Care Team:   Tommye Standard, [place] Legrand Como "Jonni Sanger" Chalmers Cater, [place]  Any Other Special Instructions Will Be Listed Below (If Applicable).  If you need a refill on your cardiac medications before your next appointment, please call your pharmacy.

## 2022-01-26 ENCOUNTER — Encounter: Payer: Medicare PPO | Admitting: Family Medicine

## 2022-01-28 NOTE — Progress Notes (Incomplete)
Phone: [phone]   Subjective:  Patient presents today for their annual physical. Chief complaint-noted.   See problem oriented charting- ROS- full  review of systems was completed and negative  except for: ***  The following were reviewed and entered/updated in epic: Past Medical History:  Diagnosis Date   Abscess    right posterior neck   Anticoagulation adequate 06/07/2019   Overview:  SSS, paroxAFib/Flutter, CHADSvasc= 3 age, h/o cva; chronic [place] Xarelto   Atrial fibrillation and flutter ([place]) 01/01/2015   Noted 2016. Xarelto    BPH associated with nocturia 08/14/2014   Finasteride, flomax.  0-3x nocturia. PSA to 14-->eval urology [facility]. PSA trended back down to about 2.6 per records in 2015 then placed on finasteride- had significant voiding issues when spiked to 14- improved on meds    Bradycardia    CAD (coronary artery disease) 05/26/2016   Coronary CTA_ 50-75% D1 off CT report    Cardiac pacemaker in situ 12/28/2009   SA node dysfunction as cause    Chest pressure 12/01/2015   Overview:  atypical CP, [facility] nuclear perfusion imaging w/ no inarct, no ischemia, EF-40% NICM   Chronic systolic heart failure ([place]) 12/01/2015   Overview:  [facility] nuclear perfusion imaging study, no inarct, no ischemia, EF-40% NICM   CVA (cerebral vascular accident) ([place])    Double vision 07/04/2018   Elevated LFTs    Erectile dysfunction 01/30/2015   cialis prn    External hemorrhoids    Fitting or adjustment of cardiac pacemaker 06/07/2019   Overview:  [place] [id number], RA and RV leads [id number]:  [id number] and [id number]   Former smoker 01/30/2015   Former smoker. 20 pack years quit 92. Patient declined AAA screen.     GERD (gastroesophageal reflux disease)    Gout 11/11/2009   Qualifier: Diagnosis of  By: Lynden Ang     History of nuclear stress test 06/07/2019   Overview:  [facility], no inarct, no ischemia, EF-40% NICM   HTN (hypertension)    Hyperglycemia 06/10/2017   110s CBG 2018    Hyperlipidemia 07/12/2007   Crestor 20mg  daily with some myalgias; fish oil. Trig 200-500 despite this. LDL ok.        Insomnia 01/30/2015   ambien 5mg  prn. May refill.     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 06/07/2019   Palpitations 06/07/2019   History of nuclear stress test 06/07/2019   S/P cardiac cath 06/07/2019   Non-occlusive coronary artery disease 06/07/2019   Double vision 07/04/2018   Trigger finger, right middle finger 03/01/2018   Hyperglycemia 06/10/2017   Solitary pulmonary nodule 05/26/2016   CAD (coronary artery disease) 05/26/2016   Nonischemic cardiomyopathy ([place]) 85/46/2703   Chronic systolic heart failure ([place]) 12/01/2015   Chest pressure 12/01/2015   Left chest pressure 03/19/2015   Insomnia 01/30/2015   Erectile dysfunction 01/30/2015   CVA (cerebral infarction) 01/30/2015   Former smoker 01/30/2015   Atrial fibrillation and flutter ([place])  01/01/2015   Allergic rhinitis 10/11/2014   BPH associated with nocturia 08/14/2014   Ventricular tachycardia, non-sustained 01/25/2013   Sinoatrial node dysfunction ([place])    SPINAL STENOSIS, LUMBAR 08/22/2010   GERD 03/17/2010   Cardiac pacemaker in situ 12/28/2009   Gout 11/11/2009   Hyperlipidemia 07/12/2007   NEUROPATHY, ISCHEMIC OPTIC 06/02/2007   Essential hypertension 06/02/2007   Past Surgical History:  Procedure Laterality Date   ABSCESS DRAINAGE     righ  tposterior neck   CARDIAC CATHETERIZATION  1996   CHOLECYSTECTOMY  02/07/2003   COLONOSCOPY  06/23/2004   EYE SURGERY     left eye   left ingunial hernia  06/08/2011   PACEMAKER INSERTION     right hip replacement     2012 Dr. Maureen Ralphs    Family History  Problem Relation Age of Onset   Lung cancer Mother        mets to brain   Heart attack Mother 66   Hypertension Mother    Brain cancer Mother    Heart attack Father        mid 48s, smoker.    Diabetes Father    Cancer Brother    Diabetes Paternal Grandfather    Healthy Brother    Colon cancer Neg Hx    Esophageal cancer Neg Hx    Stomach cancer Neg Hx    Rectal cancer Neg Hx     Medications- reviewed and updated Current Outpatient Medications  Medication Sig Dispense Refill   benazepril (LOTENSIN) 5 MG tablet Take 1 tablet by mouth once daily 90 tablet 0   cyclobenzaprine (FLEXERIL) 5 MG tablet Take 1 tablet (5 mg total) by mouth 3 (three) times daily as needed for muscle spasms. 30 tablet 2   finasteride (PROSCAR) 5 MG tablet Take 1 tablet (5 mg total) by mouth daily. 90 tablet 3   gemfibrozil (LOPID) 600 MG tablet TAKE 1 TABLET BY MOUTH TWICE DAILY BEFORE  A  MEAL 180 tablet 3   icosapent Ethyl (VASCEPA) 1 g capsule Take 1 g by mouth 2 (two) times daily.     indomethacin (INDOCIN) 25 MG capsule Take 1 capsule (25 mg total) by mouth 2 (two) times daily as needed (pain). Take with a meal. 3 days maximun 30 capsule 0   metoprolol succinate (TOPROL-XL) 25 MG 24 hr tablet Take 1 tablet (25 mg total) by mouth daily. 90 tablet 3   Multiple Vitamin (MULTIVITAMIN) tablet Take 1 tablet by mouth daily.     omeprazole (PRILOSEC) 20 MG capsule Take 1 capsule by mouth once daily 90 capsule 0   rosuvastatin (CRESTOR) 40 MG tablet Take 1 tablet by mouth once daily 90 tablet 0   tadalafil (CIALIS) 5 MG tablet TAKE 1 TABLET BY MOUTH DAILY AS NEEDED FOR ERECTILE DYSFUNCTION 90 tablet 3   XARELTO 20 MG TABS tablet TAKE 1 TABLET BY MOUTH ONCE  DAILY WITH SUPPER 90 tablet 2   zolpidem (AMBIEN) 5 MG tablet TAKE 1 TABLET BY MOUTH AT BEDTIME 90 tablet 0   No current facility-administered medications for this visit.    Allergies-reviewed and updated No Known Allergies  Social History   Social History Narrative   Married. 1 child and 1 step child. 2 grandchildren.       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 Gen: NAD, resting comfortably HEENT: Mucous membranes are moist. Oropharynx normal Neck: no thyromegaly CV: RRR no murmurs rubs or gallops Lungs: CTAB no crackles, wheeze, rhonchi Abdomen: soft/nontender/nondistended/normal bowel sounds. No rebound or guarding.  Ext: no edema Skin: warm, dry Neuro: grossly normal, moves all extremities, PERRLA ***   Assessment and Plan  76 y.o. male presenting for annual physical.  Health Maintenance counseling: 1. Anticipatory guidance: Patient counseled regarding regular dental exams- once a year ***q6 months, eye exams -around yearly ***,  avoiding smoking and second hand smoke*** , limiting alcohol to 2 beverages per day -2-3 a week- cautious with fatty liver.  ***.  No illicit drugs - *** 2. Risk factor reduction:  Advised patient of need for regular exercise and diet rich and fruits and vegetables to reduce risk of heart attack and stroke.  Exercise-swimming 3x a week and walks with wife sometimes.  ***.  Diet/weight management-weight largely stable- hed like to be 10 lbs lower but I told him current weighs is reasonable. . ***.  Wt Readings from Last 3 Encounters:  12/09/21 168 lb (76.2 kg)  09/16/21 162 lb 3.2 oz (73.6 kg)  06/20/21 172 lb 9.6 oz (78.3 kg)   3. Immunizations/screenings/ancillary studies DISCUSSED:  -COVID booster vaccine #4- *** -Shingrix vaccine #1- *** Immunization History  Administered Date(s) Administered    Fluad Quad(high Dose 65+) 08/01/2021   Hepatitis A, Adult 08/08/2015, 05/26/2016   Influenza Split 11/09/2011, 09/20/2012, 09/12/2018   Influenza Whole 11/14/2007   Influenza, High Dose Seasonal PF 08/08/2015, 09/02/2017, 08/31/2019, 09/18/2020   Influenza,inj,Quad PF,6+ Mos 09/14/2013, 12/27/2014, 09/12/2018   Influenza-Unspecified 08/22/2019   Moderna Sars-Covid-2 Vaccination 12/26/2019, 02/01/2020   Pneumococcal Conjugate-13 01/30/2015   Pneumococcal Polysaccharide-23 11/09/2011   Td 10/04/2008   Unspecified SARS-COV-2 Vaccination 08/01/2021   Health Maintenance Due  Topic Date Due   Zoster Vaccines- Shingrix (1 of 2) Never done   COVID-19 Vaccine (4 - Booster) 09/26/2021   4. Prostate cancer screening- past age based screening recommendations for PSA.  We had a discussion today and he would prefer to do at least 1 more recheck-we can discuss again on an annual basis***  Lab Results  Component Value Date   PSA 1.71 10/07/2021   PSA 2.39 09/16/2021   PSA 1.5 08/01/2020   5. Colon cancer screening - 04/23/2020 with 3 year repeat planned *** 6. Skin cancer screening-  encouraged him to follow up with Dr. Delman Cheadle or [facility] as has nonhealing wound on right ear id like for them to evaluate.  ***advised regular sunscreen use. Denies worrisome, changing, or new skin lesions.  7. Smoking associated screening (lung cancer screening, AAA screen 65-75, UA)- former smoker- quit in 1990s. 20 pack years. Had ct 11/22/2019 with no aortic aneurysm- no further screening needed. *** 8. STD screening - monogamous ***  Status of chronic or acute concerns   ***fatty liver add, arotic atherosclerosis  *** Dear Dr. Yong Channel, Last week I had a cardiac MRI and a chest x-[facility] at [facility]. At my appointment next week, if possible, I would like to review those with you regarding an intestinal blockage noted and the chest x-ray which did not mention the spot on my lung found in prior exams. Thanks so much, [place] Pam  #CV issues 1. Coronary artery disease-compliant with Xarelto-not on aspirin as a result of Xarelto 2. Hyperlipidemia-compliant with rosuvastatin 40 mgwith LDL goal 70 or less. Also takes gemfibrozil and vascepa from [facility] 3. Nonischemic cardiomyopathy/systolic heart  failure -patient follows up with [facility]. Compliant with benazepril 5 mg metoprolol 25 mg extended release. Had not required Lasix  4. sinus node dysfunction with pacemaker in place 5. Atrial fibrillation and flutter-on chronic Xarelto and metoprolol 6. Aortic atherosclerosis-on statin and Xarelto Lab Results  Component Value Date   CHOL 137 09/16/2021   HDL 42.40 09/16/2021   LDLCALC 57 09/16/2021   LDLDIRECT 70.0 11/16/2019   TRIG 189.0 (H) 09/16/2021   CHOLHDL 3 09/16/2021   A/P: ***  #Erectile dysfunction S: Patient taking Cialis 5 mg daily A/P: ***   #Insomnia S: Medication: Ambien 5 mg. Denies falls/abnormal dreams/sleepwalking/trouble driving next day  A/P: ***   #BPH S: Patient compliant with finasteride 5 mg. Previously on Flomax A/P: ***   #Arthritis S: Patient on indomethacin 25 mg twice daily for 3 days maximum per week with meal A/P: ***   # Hyperglycemia/insulin resistance/prediabetes-peak A1c 5.9 S: Medication: *** Exercise and diet- *** Lab Results  Component Value Date   HGBA1C 5.6 09/16/2021   HGBA1C 5.8 (H) 08/01/2020   HGBA1C 5.9 04/19/2019    A/P: ***  #hypertension S: medication: Metoprolol 25 mg extended release, benazepril 5 mg Home readings #s: *** BP Readings from Last 3 Encounters:  12/09/21 114/68  09/16/21 120/84  06/20/21 106/60  A/P: ***  #Neuropathy-burning sensation in feetFor over 5 years-had seen Dr. Posey Pronto in the past   #Abdominal pain December 2020 -CT abdomen pelvis   #Gout-not on uric acid lowering agent  Recommended follow up: No follow-ups on file. Future Appointments  Date Time Provider [place]  02/05/2022 11:20 AM Marin Olp, MD  [facility]  05/21/2022  2:40 PM [facility]    No chief complaint on file.  Lab/Order associations:*** fasting No diagnosis found.  No orders of the defined types were placed in this encounter.   I,Jada Bradford,acting as a scribe for Garret Reddish, MD.,have documented all relevant documentation on the behalf of Garret Reddish, MD,as directed by  Garret Reddish, MD while in the presence of Garret Reddish, MD.  *** Return precautions advised.  Burnett Corrente

## 2022-02-04 ENCOUNTER — Other Ambulatory Visit: Payer: Self-pay | Admitting: Family Medicine

## 2022-02-05 ENCOUNTER — Encounter: Payer: Self-pay | Admitting: Family Medicine

## 2022-02-05 ENCOUNTER — Telehealth (INDEPENDENT_AMBULATORY_CARE_PROVIDER_SITE_OTHER): Payer: Medicare PPO | Admitting: Family Medicine

## 2022-02-05 ENCOUNTER — Encounter: Payer: Medicare PPO | Admitting: Family Medicine

## 2022-02-05 VITALS — Ht 68.0 in | Wt 165.0 lb

## 2022-02-05 DIAGNOSIS — U071 COVID-19: Secondary | ICD-10-CM

## 2022-02-05 DIAGNOSIS — Z0000 Encounter for general adult medical examination without abnormal findings: Secondary | ICD-10-CM

## 2022-02-05 DIAGNOSIS — I10 Essential (primary) hypertension: Secondary | ICD-10-CM | POA: Diagnosis not present

## 2022-02-05 DIAGNOSIS — I428 Other cardiomyopathies: Secondary | ICD-10-CM

## 2022-02-05 DIAGNOSIS — I700 Atherosclerosis of aorta: Secondary | ICD-10-CM

## 2022-02-05 DIAGNOSIS — R739 Hyperglycemia, unspecified: Secondary | ICD-10-CM

## 2022-02-05 DIAGNOSIS — I4891 Unspecified atrial fibrillation: Secondary | ICD-10-CM

## 2022-02-05 DIAGNOSIS — E785 Hyperlipidemia, unspecified: Secondary | ICD-10-CM

## 2022-02-05 DIAGNOSIS — I2510 Atherosclerotic heart disease of native coronary artery without angina pectoris: Secondary | ICD-10-CM

## 2022-02-05 NOTE — Progress Notes (Signed)
?Phone [phone] ?Virtual visit via Video note ?  ?Subjective:  ?Chief complaint: ?Chief Complaint  ?Patient presents with  ? Covid Positive  ?  Pt tested positive for covid, he c/o stuffy nose and cough. He does not remember when his symptoms started.  ? ? ?This visit type was conducted due to national recommendations for restrictions regarding the COVID-19 Pandemic (e.g. social distancing).  This format is felt to be most appropriate for this patient at this time balancing risks to patient and risks to population by having him in for in person visit.  No physical exam was performed (except for noted visual exam or audio findings with Telehealth visits).   ? ?Our team/I connected with Hermina Barters at 11:20 AM EST by a video enabled telemedicine application ([email or web address] or caregility through epic) and verified that I am speaking with the correct person using two identifiers.  ?Location patient: [place] ?Location provider: [facility], office ?Persons participating in the virtual visit:  patient ? ?Our team/I discussed the limitations of evaluation and management by telemedicine and the availability of in person appointments. In light of current covid-19 pandemic, patient also understands that we are trying to protect them by minimizing in office contact if at all possible.  The patient expressed consent for telemedicine visit and agreed to proceed. Patient understands insurance will be billed.  ? ?Past Medical History-  ?Patient Active Problem List  ? Diagnosis Date Noted  ? Paroxysmal atrial fibrillation ([place]) 07/27/2019  ?  Priority: High  ? S/P cardiac cath 06/07/2019  ?  Priority: High  ? CAD (coronary artery disease) 05/26/2016  ?  Priority: High  ? Nonischemic cardiomyopathy ([place]) 04/23/2016  ?  Priority: High  ? Chronic systolic heart failure ([place]) 12/01/2015  ?  Priority: High  ? CVA (cerebral infarction) 01/30/2015  ?  Priority: High  ? Atrial fibrillation and flutter ([place]) 01/01/2015  ?  Priority:  High  ? Ventricular tachycardia, non-sustained 01/25/2013  ?  Priority: High  ? Cardiac pacemaker in situ 12/28/2009  ?  Priority: High  ? Aortic atherosclerosis ([place]) 08/01/2020  ?  Priority: Medium   ? Fatty liver 08/01/2020  ?  Priority: Medium   ? Double vision 07/04/2018  ?  Priority: Medium   ? Hyperglycemia 06/10/2017  ?  Priority: Medium   ? Insomnia 01/30/2015  ?  Priority: Medium   ? BPH associated with nocturia 08/14/2014  ?  Priority: Medium   ? Gout 11/11/2009  ?  Priority: Medium   ? Hyperlipidemia 07/12/2007  ?  Priority: Medium   ? NEUROPATHY, ISCHEMIC OPTIC 06/02/2007  ?  Priority: Medium   ? Essential hypertension 06/02/2007  ?  Priority: Medium   ? Anticoagulation adequate 06/07/2019  ?  Priority: Low  ? Convergence insufficiency 06/07/2019  ?  Priority: Low  ? Fitting or adjustment of cardiac pacemaker 06/07/2019  ?  Priority: Low  ? 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  Priority: Low  ? Palpitations 06/07/2019  ?  Priority: Low  ? History of nuclear stress test 06/07/2019  ?  Priority: Low  ? Non-occlusive coronary artery disease 06/07/2019  ?  Priority: Low  ? Trigger finger, right middle finger 03/01/2018  ?  Priority: Low  ? Solitary pulmonary nodule 05/26/2016  ?  Priority: Low  ? Chest pressure 12/01/2015  ?  Priority: Low  ? Left chest pressure 03/19/2015  ?  Priority: Low  ? Erectile dysfunction 01/30/2015  ?  Priority: Low  ? Former smoker 01/30/2015  ?  Priority: Low  ? Allergic rhinitis 10/11/2014  ?  Priority: Low  ? Sinoatrial node dysfunction (HCC)   ?  Priority: Low  ? SPINAL STENOSIS, LUMBAR 08/22/2010  ?  Priority: Low  ? GERD 03/17/2010  ?  Priority: Low  ? ? ?Medications- reviewed and updated ?Current Outpatient Medications  ?Medication Sig Dispense Refill  ? benazepril (LOTENSIN) 5 MG tablet Take 1 tablet by mouth once daily 90 tablet 0  ? cyclobenzaprine (FLEXERIL) 5 MG tablet Take 1 tablet (5 mg total) by mouth 3 (three)  times daily as needed for muscle spasms. 30 tablet 2  ? finasteride (PROSCAR) 5 MG tablet Take 1 tablet (5 mg total) by mouth daily. 90 tablet 3  ? gemfibrozil (LOPID) 600 MG tablet TAKE 1 TABLET BY MOUTH TWICE DAILY BEFORE  A  MEAL 180 tablet 3  ? icosapent Ethyl (VASCEPA) 1 g capsule Take 1 g by mouth 2 (two) times daily.    ? indomethacin (INDOCIN) 25 MG capsule Take 1 capsule (25 mg total) by mouth 2 (two) times daily as needed (pain). Take with a meal. 3 days maximun 30 capsule 0  ? metoprolol succinate (TOPROL-XL) 25 MG 24 hr tablet Take 1 tablet (25 mg total) by mouth daily. 90 tablet 3  ? Multiple Vitamin (MULTIVITAMIN) tablet Take 1 tablet by mouth daily.    ? omeprazole (PRILOSEC) 20 MG capsule Take 1 capsule by mouth once daily 90 capsule 0  ? rosuvastatin (CRESTOR) 40 MG tablet Take 1 tablet by mouth once daily 90 tablet 0  ? tadalafil (CIALIS) 5 MG tablet TAKE 1 TABLET BY MOUTH DAILY AS NEEDED FOR ERECTILE DYSFUNCTION 90 tablet 3  ? XARELTO 20 MG TABS tablet TAKE 1 TABLET BY MOUTH ONCE DAILY WITH SUPPER 90 tablet 2  ? zolpidem (AMBIEN) 5 MG tablet TAKE 1 TABLET BY MOUTH AT BEDTIME 90 tablet 0  ? ?No current facility-administered medications for this visit.  ? ?  ?Objective:  ?Ht '5\' 8"'$  (1.727 m)   Wt 165 lb (74.8 kg)   SpO2 96%   BMI 25.09 kg/m?  self reported vitals ?Gen: NAD, resting comfortably ?Lungs: nonlabored, normal respiratory rate  ?Skin: appears dry, no obvious rash ? ?  ? ?Assessment and Plan  ? ?# covid 19 ?S: Patient tested positive for COVID yesterday  ?-Current symptoms include stuffy nose with clearish discharge and cough- occasional sputum - possibly clear ?-also started with diarrhea late February or early march- did not think of it as related to covid- those symptoms have resolved ?-Symptom onset last Thursday ?-started dayquil/nyquil last Thursday (advised to avoid decongestant) ?-delsym working reasonably well ?-has not tried sinus rinse ?-no SOB or fever ? ?Had similar symptoms  about 2 months ago. They had been traveling in January and also had some diarrhea  ?A/P: Patient with testing confirming covid 19 with first day of covid 19 symptoms  ?Vaccination status: Had 3 COVID vaccinations including bivalent 08/01/2021 it appears  ? ?Therefore: ?- recommended patient watch closely for shortness of breath or confusion or worsening symptoms and if those occur patient should contact us immediately or seek care in the emergency department ?-recommended patient consider purchasing pulse oximeter and if levels 94% or below persistently- seek care at the hospital ?- Patient needs to self isolate  for at least 5 days since first symptom AND at least 24 hours fever free without fever reducing medications AND have improvement in respiratory  symptoms . After 5 days can end self isolation but still needs to wear mask for additional 5 days .  ?-outside of window for antivirals paxlovid and molnupiravir as symptoms started over a week ago ? ? ?#hypertension ?S: medication: benazepril 5 gm, metoprolol 25 mg XR ?BP Readings from Last 3 Encounters:  ?12/09/21 114/68  ?09/16/21 120/84  ?06/20/21 106/60  ?A/P: blood pressure traditionally well controlled- with diagnosis in mind advised against decongestants. Also discussed if were to get dehydrated now or in future to hold benazepril ? ?Recommended follow up: keep April visit ?Future Appointments  ?Date Time Provider [place]  ?03/05/2022 10:40 AM Marin Olp, MD [facility]  ?05/21/2022  2:40 PM [facility]ICE [facility]  ? ? ?Lab/Order associations: ?  ICD-10-CM   ?1. COVID-19  U07.1   ?  ?2. Essential hypertension  I10   ?  ?\ ? ?Time Spent: ?26 minutes of total time (12:00 PM- 12:26 PM) was spent on the date of the encounter performing the following actions: chart review prior to seeing the patient, obtaining history, performing a medically necessary exam, counseling on the treatment plan, placing orders, and documenting in  our EHR.  ? ?Return precautions advised.  ?Garret Reddish, MD ? ?

## 2022-02-19 ENCOUNTER — Ambulatory Visit (INDEPENDENT_AMBULATORY_CARE_PROVIDER_SITE_OTHER): Payer: Medicare PPO

## 2022-02-19 ENCOUNTER — Other Ambulatory Visit: Payer: Self-pay

## 2022-02-19 DIAGNOSIS — Z0000 Encounter for general adult medical examination without abnormal findings: Secondary | ICD-10-CM

## 2022-02-19 NOTE — Progress Notes (Addendum)
Virtual Visit via Telephone Note ? ?I connected with  [name] on 02/19/22 at  3:30 PM EDT by telephone and verified that I am speaking with the correct person using two identifiers. ? ?Medicare Annual Wellness visit completed telephonically due to Covid-19 pandemic.  ? ?Persons participating in this call: This Health Coach and this patient.  ? ?Location: ?Patient: Home ?Provider: Office ?  ?I discussed the limitations, risks, security and privacy concerns of performing an evaluation and management service by telephone and the availability of in person appointments. The patient expressed understanding and agreed to proceed. ? ?Unable to perform video visit due to video visit attempted and failed and/or patient does not have video capability.  ? ?Some vital signs may be absent or patient reported.  ? ?Willette Brace, LPN ? ? ? ?Subjective:  ? [name] is a 76 y.o. male who presents for Medicare Annual/Subsequent preventive examination. ? ?Review of Systems    ? ?Cardiac Risk Factors include: advanced age (>86mn, >>37women);hypertension;dyslipidemia ? ?   ?Objective:  ?  ?There were no vitals filed for this visit. ?There is no height or weight on file to calculate BMI. ? ? ?  02/19/2022  ?  3:46 PM 02/15/2020  ?  1:37 PM 06/07/2019  ?  7:35 AM 07/06/2017  ?  2:36 PM  ?Advanced Directives  ?Does Patient Have a Medical Advance Directive? Yes Yes No Yes  ?Type of Advance Directive Healthcare Power of Attorney Living will;Healthcare Power of Attorney    ?Does patient want to make changes to medical advance directive?  No - Patient declined    ?Copy of [place] Chart? No - copy requested No - copy requested    ? ? ?Current Medications (verified) ?Outpatient Encounter Medications as of 02/19/2022  ?Medication Sig  ? benazepril (LOTENSIN) 5 MG tablet Take 1 tablet by mouth once daily  ? cyclobenzaprine (FLEXERIL) 5 MG tablet Take 1 tablet (5 mg total) by mouth 3 (three) times daily as  needed for muscle spasms.  ? finasteride (PROSCAR) 5 MG tablet Take 1 tablet (5 mg total) by mouth daily.  ? gemfibrozil (LOPID) 600 MG tablet TAKE 1 TABLET BY MOUTH TWICE DAILY BEFORE  A  MEAL  ? icosapent Ethyl (VASCEPA) 1 g capsule Take 1 g by mouth 2 (two) times daily.  ? indomethacin (INDOCIN) 25 MG capsule Take 1 capsule (25 mg total) by mouth 2 (two) times daily as needed (pain). Take with a meal. 3 days maximun  ? metoprolol succinate (TOPROL-XL) 25 MG 24 hr tablet Take 1 tablet (25 mg total) by mouth daily.  ? Multiple Vitamin (MULTIVITAMIN) tablet Take 1 tablet by mouth daily.  ? omeprazole (PRILOSEC) 20 MG capsule Take 1 capsule by mouth once daily (Patient taking differently: every other day.)  ? rosuvastatin (CRESTOR) 40 MG tablet Take 1 tablet by mouth once daily  ? tadalafil (CIALIS) 5 MG tablet TAKE 1 TABLET BY MOUTH DAILY AS NEEDED FOR ERECTILE DYSFUNCTION  ? XARELTO 20 MG TABS tablet TAKE 1 TABLET BY MOUTH ONCE DAILY WITH SUPPER  ? zolpidem (AMBIEN) 5 MG tablet TAKE 1 TABLET BY MOUTH AT BEDTIME  ? ?No facility-administered encounter medications on file as of 02/19/2022.  ? ? ?Allergies (verified) ?Patient has no known allergies.  ? ?History: ?Past Medical History:  ?Diagnosis Date  ? Abscess   ? right posterior neck  ? Anticoagulation adequate 06/07/2019  ? Overview:  SSS, paroxAFib/Flutter, CHADSvasc= 3 age,  h/o cva; chronic [place] Xarelto  ? Atrial fibrillation and flutter ([place]) 01/01/2015  ? Noted 2016. Xarelto   ? BPH associated with nocturia 08/14/2014  ? Finasteride, flomax.  0-3x nocturia. PSA to 14-->eval urology [facility]. PSA trended back down to about 2.6 per records in 2015 then placed on finasteride- had significant voiding issues when spiked to 14- improved on meds   ? Bradycardia   ? CAD (coronary artery disease) 05/26/2016  ? Coronary CTA_ 50-75% D1 off CT report   ? Cardiac pacemaker in situ 12/28/2009  ? SA node dysfunction as cause   ? Chest pressure 12/01/2015  ? Overview:  atypical CP, [facility]  nuclear perfusion imaging w/ no inarct, no ischemia, EF-40% NICM  ? Chronic systolic heart failure ([place]) 12/01/2015  ? Overview:  [facility] nuclear perfusion imaging study, no inarct, no ischemia, EF-40% NICM  ? CVA (cerebral vascular accident) [facility])   ? Double vision 07/04/2018  ? Elevated LFTs   ? Erectile dysfunction 01/30/2015  ? cialis prn   ? External hemorrhoids   ? Fitting or adjustment of cardiac pacemaker 06/07/2019  ? Overview:  [place] [id number], RA and RV leads [id number]:  [id number]  ? Former smoker 01/30/2015  ? Former smoker. 20 pack years quit 92. Patient declined AAA screen.    ? GERD (gastroesophageal reflux disease)   ? Gout 11/11/2009  ? Qualifier: Diagnosis of  By: Lynden Ang    ? History of nuclear stress test 06/07/2019  ? Overview:  [facility], no inarct, no ischemia, EF-40% NICM  ? HTN (hypertension)   ? Hyperglycemia 06/10/2017  ? 110s CBG 2018  ? Hyperlipidemia 07/12/2007  ? Crestor '20mg'$  daily with some myalgias; fish oil. Trig 200-500 despite this. LDL ok.       ? Insomnia 01/30/2015  ? ambien '5mg'$  prn. May refill.    ? Ischemic optic neuropathy   ? Left chest pressure 03/19/2015  ? Other and unspecified hyperlipidemia   ? Sinoatrial node dysfunction ([facility])   ? Spinal stenosis   ? Trigger finger, right middle finger 03/01/2018  ? ?Past Surgical History:  ?Procedure Laterality Date  ? ABSCESS DRAINAGE    ? righ tposterior neck  ? CARDIAC CATHETERIZATION  1996  ? CHOLECYSTECTOMY  02/07/2003  ? COLONOSCOPY  06/23/2004  ? EYE SURGERY    ? left eye  ? left ingunial hernia  06/08/2011  ? PACEMAKER INSERTION    ? right hip replacement    ? 2012 Dr. Maureen Ralphs  ? ?Family History  ?Problem Relation Age of Onset  ? Lung cancer Mother   ?     mets to brain  ? Heart attack Mother 32  ? Hypertension Mother   ? Brain cancer Mother   ? Heart attack Father   ?     mid 48s, smoker.   ? Diabetes Father   ? Cancer Brother   ? Diabetes Paternal Grandfather   ? Healthy Brother   ? Colon cancer Neg Hx   ? Esophageal  cancer Neg Hx   ? Stomach cancer Neg Hx   ? Rectal cancer Neg Hx   ? ?Social History  ? ?Socioeconomic History  ? Marital status: Married  ?  Spouse name: Not on file  ? Number of children: 1  ? Years of education: 56  ? Highest education level: Not on file  ?Occupational History  ? Not on file  ?Tobacco Use  ? Smoking status: Former  ?  Packs/day: 1.00  ?  Years: 20.00  ?  Pack years: 20.00  ?  Types: Cigarettes  ?  Quit date: 11/04/1991  ?  Years since quitting: 30.3  ? Smokeless tobacco: Never  ?Vaping Use  ? Vaping Use: Never used  ?Substance and Sexual Activity  ? Alcohol use: Yes  ?  Alcohol/week: 0.0 standard drinks  ?  Comment: occ  ? Drug use: No  ? Sexual activity: Not on file  ?Other Topics Concern  ? Not on file  ?Social History Narrative  ? Married. 1 child and 1 step child. 2 grandchildren.   ?   ? 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   ? Hobbies: swimming (states not fun), travel  ?   ? Lives in a single story home  ? ?Social Determinants of Health  ? ?Financial Resource Strain: Low Risk   ? Difficulty of Paying Living Expenses: Not hard at all  ?Food Insecurity: No Food Insecurity  ? Worried About Charity fundraiser in the Last Year: Never true  ? Ran Out of Food in the Last Year: Never true  ?Transportation Needs: No Transportation Needs  ? Lack of Transportation (Medical): No  ? Lack of Transportation (Non-Medical): No  ?Physical Activity: Sufficiently Active  ? Days of Exercise per Week: 4 days  ? Minutes of Exercise per Session: 60 min  ?Stress: No Stress Concern Present  ? Feeling of Stress : Not at all  ?Social Connections: Moderately Isolated  ? Frequency of Communication with Friends and Family: More than three times a week  ? Frequency of Social Gatherings with Friends and Family: Twice a week  ? Attends Religious Services: Never  ? Active Member of Clubs or Organizations: No  ? Attends Archivist Meetings: Never  ? Marital Status: Married  ? ? ?Tobacco Counseling ?Counseling given: Not  Answered ? ? ?Clinical Intake: ? ?Pre-visit preparation completed: Yes ? ?Pain : No/denies pain ? ?  ? ?BMI - recorded: 25.09 ?Nutritional Status: BMI 25 -29 Overweight ?Nutritional Risks: None ?Diabetes: No

## 2022-02-19 NOTE — Patient Instructions (Signed)
[name] , ?Thank you for taking time to come for your Medicare Wellness Visit. I appreciate your ongoing commitment to your health goals. Please review the following plan we discussed and let me know if I can assist you in the future.  ? ?Screening recommendations/referrals: ?Colonoscopy: Done 04/23/20 repeat every 3 years  ?Recommended yearly ophthalmology/optometry visit for glaucoma screening and checkup ?Recommended yearly dental visit for hygiene and checkup ? ?Vaccinations: ?Influenza vaccine: Done 08/01/21 repeat every year  ?Pneumococcal vaccine: Up to date ?Tdap vaccine: Due and discussed  ?Shingles vaccine: Shingrix discussed. Please contact your pharmacy for coverage information.    ?Covid-19: Completed 1/26, 02/01/20,  08/11/21 ? ?Advanced directives: Please bring a copy of your health care power of attorney and living will to the office at your convenience. ? ?Conditions/risks identified: Keep Living  ? ?Next appointment: Follow up in one year for your annual wellness visit.  ? ?Preventive Care 74 Years and Older, Male ?Preventive care refers to lifestyle choices and visits with your health care provider that can promote health and wellness. ?What does preventive care include? ?A yearly physical exam. This is also called an annual well check. ?Dental exams once or twice a year. ?Routine eye exams. Ask your health care provider how often you should have your eyes checked. ?Personal lifestyle choices, including: ?Daily care of your teeth and gums. ?Regular physical activity. ?Eating a healthy diet. ?Avoiding tobacco and drug use. ?Limiting alcohol use. ?Practicing safe sex. ?Taking low doses of aspirin every day. ?Taking vitamin and mineral supplements as recommended by your health care provider. ?What happens during an annual well check? ?The services and screenings done by your health care provider during your annual well check will depend on your age, overall health, lifestyle risk factors, and family  history of disease. ?Counseling  ?Your health care provider may ask you questions about your: ?Alcohol use. ?Tobacco use. ?Drug use. ?Emotional well-being. ?Home and relationship well-being. ?Sexual activity. ?Eating habits. ?History of falls. ?Memory and ability to understand (cognition). ?Work and work Statistician. ?Screening  ?You may have the following tests or measurements: ?Height, weight, and BMI. ?Blood pressure. ?Lipid and cholesterol levels. These may be checked every 5 years, or more frequently if you are over 23 years old. ?Skin check. ?Lung cancer screening. You may have this screening every year starting at age 29 if you have a 30-pack-year history of smoking and currently smoke or have quit within the past 15 years. ?Fecal occult blood test (FOBT) of the stool. You may have this test every year starting at age 65. ?Flexible sigmoidoscopy or colonoscopy. You may have a sigmoidoscopy every 5 years or a colonoscopy every 10 years starting at age 86. ?Prostate cancer screening. Recommendations will vary depending on your family history and other risks. ?Hepatitis C blood test. ?Hepatitis B blood test. ?Sexually transmitted disease (STD) testing. ?Diabetes screening. This is done by checking your blood sugar (glucose) after you have not eaten for a while (fasting). You may have this done every 1-3 years. ?Abdominal aortic aneurysm (AAA) screening. You may need this if you are a current or former smoker. ?Osteoporosis. You may be screened starting at age 18 if you are at high risk. ?Talk with your health care provider about your test results, treatment options, and if necessary, the need for more tests. ?Vaccines  ?Your health care provider may recommend certain vaccines, such as: ?Influenza vaccine. This is recommended every year. ?Tetanus, diphtheria, and acellular pertussis (Tdap, Td) vaccine. You may need  a Td booster every 10 years. ?Zoster vaccine. You may need this after age 49. ?Pneumococcal  13-valent conjugate (PCV13) vaccine. One dose is recommended after age 72. ?Pneumococcal polysaccharide (PPSV23) vaccine. One dose is recommended after age 49. ?Talk to your health care provider about which screenings and vaccines you need and how often you need them. ?This information is not intended to replace advice given to you by your health care provider. Make sure you discuss any questions you have with your health care provider. ?Document Released: 12/13/2015 Document Revised: 08/05/2016 Document Reviewed: 09/17/2015 ?Elsevier Interactive Patient Education ? 2017 [employer]. ? ?Fall Prevention in the Home ?Falls can cause injuries. They can happen to people of all ages. There are many things you can do to make your home safe and to help prevent falls. ?What can I do on the outside of my home? ?Regularly fix the edges of walkways and driveways and fix any cracks. ?Remove anything that might make you trip as you walk through a door, such as a raised step or threshold. ?Trim any bushes or trees on the path to your home. ?Use bright outdoor lighting. ?Clear any walking paths of anything that might make someone trip, such as rocks or tools. ?Regularly check to see if handrails are loose or broken. Make sure that both sides of any steps have handrails. ?Any raised decks and porches should have guardrails on the edges. ?Have any leaves, snow, or ice cleared regularly. ?Use sand or salt on walking paths during winter. ?Clean up any spills in your garage right away. This includes oil or grease spills. ?What can I do in the bathroom? ?Use night lights. ?Install grab bars by the toilet and in the tub and shower. Do not use towel bars as grab bars. ?Use non-skid mats or decals in the tub or shower. ?If you need to sit down in the shower, use a plastic, non-slip stool. ?Keep the floor dry. Clean up any water that spills on the floor as soon as it happens. ?Remove soap buildup in the tub or shower regularly. ?Attach  bath mats securely with double-sided non-slip rug tape. ?Do not have throw rugs and other things on the floor that can make you trip. ?What can I do in the bedroom? ?Use night lights. ?Make sure that you have a light by your bed that is easy to reach. ?Do not use any sheets or blankets that are too big for your bed. They should not hang down onto the floor. ?Have a firm chair that has side arms. You can use this for support while you get dressed. ?Do not have throw rugs and other things on the floor that can make you trip. ?What can I do in the kitchen? ?Clean up any spills right away. ?Avoid walking on wet floors. ?Keep items that you use a lot in easy-to-reach places. ?If you need to reach something above you, use a strong step stool that has a grab bar. ?Keep electrical cords out of the way. ?Do not use floor polish or wax that makes floors slippery. If you must use wax, use non-skid floor wax. ?Do not have throw rugs and other things on the floor that can make you trip. ?What can I do with my stairs? ?Do not leave any items on the stairs. ?Make sure that there are handrails on both sides of the stairs and use them. Fix handrails that are broken or loose. Make sure that handrails are as long as the stairways. ?Check  any carpeting to make sure that it is firmly attached to the stairs. Fix any carpet that is loose or worn. ?Avoid having throw rugs at the top or bottom of the stairs. If you do have throw rugs, attach them to the floor with carpet tape. ?Make sure that you have a light switch at the top of the stairs and the bottom of the stairs. If you do not have them, ask someone to add them for you. ?What else can I do to help prevent falls? ?Wear shoes that: ?Do not have high heels. ?Have rubber bottoms. ?Are comfortable and fit you well. ?Are closed at the toe. Do not wear sandals. ?If you use a stepladder: ?Make sure that it is fully opened. Do not climb a closed stepladder. ?Make sure that both sides of the  stepladder are locked into place. ?Ask someone to hold it for you, if possible. ?Clearly mark and make sure that you can see: ?Any grab bars or handrails. ?First and last steps. ?Where the edge of each step is.

## 2022-02-23 NOTE — Progress Notes (Signed)
? ?Phone: [phone] ?  ?Subjective:  ?Patient presents today for their annual physical. Chief complaint-noted.  ? ?See problem oriented charting- ?ROS- full  review of systems was completed and negative  ?except for: congestion, ear pain, post nasal drip, sinus pressure but improved, sneezing, tinnitus, seasonal allergies, joint pain, back pain ? ?The following were reviewed and entered/updated in epic: ?Past Medical History:  ?Diagnosis Date  ? Abscess   ? right posterior neck  ? Anticoagulation adequate 06/07/2019  ? Overview:  SSS, paroxAFib/Flutter, CHADSvasc= 3 age, h/o cva; chronic [facility] Xarelto  ? Atrial fibrillation and flutter ([place]) 01/01/2015  ? Noted 2016. Xarelto   ? BPH associated with nocturia 08/14/2014  ? Finasteride, flomax.  0-3x nocturia. PSA to 14-->eval urology [facility]. PSA trended back down to about 2.6 per records in 2015 then placed on finasteride- had significant voiding issues when spiked to 14- improved on meds   ? Bradycardia   ? CAD (coronary artery disease) 05/26/2016  ? Coronary CTA_ 50-75% D1 off CT report   ? Cardiac pacemaker in situ 12/28/2009  ? SA node dysfunction as cause   ? Chest pressure 12/01/2015  ? Overview:  atypical CP, [facility] nuclear perfusion imaging w/ no inarct, no ischemia, EF-40% NICM  ? Chronic systolic heart failure ([place]) 12/01/2015  ? Overview:  [facility] nuclear perfusion imaging study, no inarct, no ischemia, EF-40% NICM  ? CVA (cerebral vascular accident) [facility])   ? Double vision 07/04/2018  ? Elevated LFTs   ? Erectile dysfunction 01/30/2015  ? cialis prn   ? External hemorrhoids   ? Fitting or adjustment of cardiac pacemaker 06/07/2019  ? Overview:  [place] [id number], RA and RV leads [id number]:  [id number]  ? Former smoker 01/30/2015  ? Former smoker. 20 pack years quit 92. Patient declined AAA screen.    ? GERD (gastroesophageal reflux disease)   ? Gout 11/11/2009  ? Qualifier: Diagnosis of  By: Lynden Ang    ? History of nuclear stress test 06/07/2019  ?  Overview:  [facility], no inarct, no ischemia, EF-40% NICM  ? HTN (hypertension)   ? Hyperglycemia 06/10/2017  ? 110s CBG 2018  ? Hyperlipidemia 07/12/2007  ? Crestor '20mg'$  daily with some myalgias; fish oil. Trig 200-500 despite this. LDL ok.       ? Insomnia 01/30/2015  ? ambien '5mg'$  prn. May refill.    ? Ischemic optic neuropathy   ? Left chest pressure 03/19/2015  ? Other and unspecified hyperlipidemia   ? Sinoatrial node dysfunction (HCC)   ? Spinal stenosis   ? Trigger finger, right middle finger 03/01/2018  ? ?Patient Active Problem List  ? Diagnosis Date Noted  ? Paroxysmal atrial fibrillation ([place]) 07/27/2019  ?  Priority: High  ? S/P cardiac cath 06/07/2019  ?  Priority: High  ? CAD (coronary artery disease) 05/26/2016  ?  Priority: High  ? Nonischemic cardiomyopathy ([place]) 04/23/2016  ?  Priority: High  ? Chronic systolic heart failure ([place]) 12/01/2015  ?  Priority: High  ? CVA (cerebral infarction) 01/30/2015  ?  Priority: High  ? Atrial fibrillation and flutter ([place]) 01/01/2015  ?  Priority: High  ? Ventricular tachycardia, non-sustained ([place]) 01/25/2013  ?  Priority: High  ? Cardiac pacemaker in situ 12/28/2009  ?  Priority: High  ? Aortic atherosclerosis ([place]) 08/01/2020  ?  Priority: Medium   ? Fatty liver 08/01/2020  ?  Priority: Medium   ? Double vision 07/04/2018  ?  Priority: Medium   ?  Hyperglycemia 06/10/2017  ?  Priority: Medium   ? Insomnia 01/30/2015  ?  Priority: Medium   ? BPH associated with nocturia 08/14/2014  ?  Priority: Medium   ? Gout 11/11/2009  ?  Priority: Medium   ? Hyperlipidemia 07/12/2007  ?  Priority: Medium   ? NEUROPATHY, ISCHEMIC OPTIC 06/02/2007  ?  Priority: Medium   ? Essential hypertension 06/02/2007  ?  Priority: Medium   ? Anticoagulation adequate 06/07/2019  ?  Priority: Low  ? Convergence insufficiency 06/07/2019  ?  Priority: Low  ? Fitting or adjustment of cardiac pacemaker 06/07/2019  ?  Priority: Low  ? Pacemaker-dependent due to native cardiac rhythm insufficient to support  life 06/07/2019  ?  Priority: Low  ? Palpitations 06/07/2019  ?  Priority: Low  ? History of nuclear stress test 06/07/2019  ?  Priority: Low  ? Non-occlusive coronary artery disease 06/07/2019  ?  Priority: Low  ? Trigger finger, right middle finger 03/01/2018  ?  Priority: Low  ? Solitary pulmonary nodule 05/26/2016  ?  Priority: Low  ? Chest pressure 12/01/2015  ?  Priority: Low  ? Left chest pressure 03/19/2015  ?  Priority: Low  ? Erectile dysfunction 01/30/2015  ?  Priority: Low  ? Former smoker 01/30/2015  ?  Priority: Low  ? Allergic rhinitis 10/11/2014  ?  Priority: Low  ? Sinoatrial node dysfunction (HCC)   ?  Priority: Low  ? SPINAL STENOSIS, LUMBAR 08/22/2010  ?  Priority: Low  ? GERD 03/17/2010  ?  Priority: Low  ? ?Past Surgical History:  ?Procedure Laterality Date  ? ABSCESS DRAINAGE    ? righ tposterior neck  ? CARDIAC CATHETERIZATION  1996  ? CHOLECYSTECTOMY  02/07/2003  ? COLONOSCOPY  06/23/2004  ? EYE SURGERY    ? left eye  ? left ingunial hernia  06/08/2011  ? PACEMAKER INSERTION    ? right hip replacement    ? 2012 Dr. Maureen Ralphs  ? ? ?Family History  ?Problem Relation Age of Onset  ? Lung cancer Mother   ?     mets to brain  ? Heart attack Mother 19  ? Hypertension Mother   ? Brain cancer Mother   ? Heart attack Father   ?     mid 79s, smoker.   ? Diabetes Father   ? Cancer Brother   ? Diabetes Paternal Grandfather   ? Healthy Brother   ? Colon cancer Neg Hx   ? Esophageal cancer Neg Hx   ? Stomach cancer Neg Hx   ? Rectal cancer Neg Hx   ? ? ?Medications- reviewed and updated ?Current Outpatient Medications  ?Medication Sig Dispense Refill  ? benazepril (LOTENSIN) 5 MG tablet Take 1 tablet by mouth once daily 90 tablet 0  ? cyclobenzaprine (FLEXERIL) 5 MG tablet Take 1 tablet (5 mg total) by mouth 3 (three) times daily as needed for muscle spasms. 30 tablet 2  ? finasteride (PROSCAR) 5 MG tablet Take 1 tablet (5 mg total) by mouth daily. 90 tablet 3  ? gemfibrozil (LOPID) 600 MG tablet TAKE 1  TABLET BY MOUTH TWICE DAILY BEFORE  A  MEAL 180 tablet 3  ? icosapent Ethyl (VASCEPA) 1 g capsule Take 1 g by mouth 2 (two) times daily.    ? indomethacin (INDOCIN) 25 MG capsule Take 1 capsule (25 mg total) by mouth 2 (two) times daily as needed (pain). Take with a meal. 3 days maximun 30 capsule 0  ?  metoprolol succinate (TOPROL-XL) 25 MG 24 hr tablet Take 1 tablet (25 mg total) by mouth daily. 90 tablet 3  ? Multiple Vitamin (MULTIVITAMIN) tablet Take 1 tablet by mouth daily.    ? omeprazole (PRILOSEC) 20 MG capsule Take 1 capsule by mouth once daily 90 capsule 0  ? rosuvastatin (CRESTOR) 40 MG tablet Take 1 tablet by mouth once daily 90 tablet 0  ? tadalafil (CIALIS) 5 MG tablet TAKE 1 TABLET BY MOUTH DAILY AS NEEDED FOR ERECTILE DYSFUNCTION 90 tablet 3  ? XARELTO 20 MG TABS tablet TAKE 1 TABLET BY MOUTH ONCE DAILY WITH SUPPER 90 tablet 2  ? zolpidem (AMBIEN) 5 MG tablet TAKE 1 TABLET BY MOUTH AT BEDTIME 90 tablet 0  ? ?No current facility-administered medications for this visit.  ? ? ?Allergies-reviewed and updated ?No Known Allergies ? ?Social History  ? ?Social History Narrative  ? Married. 1 child and 1 step child. 2 grandchildren.   ?   ? 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   ? Hobbies: swimming (states not fun), travel  ?   ? Lives in a single story home  ? ?Objective  ?Objective:  ?BP 110/74   Pulse 72   Temp 98.2 ?F (36.8 ?C)   Ht '5\' 8"'$  (1.727 m)   Wt 164 lb 6.4 oz (74.6 kg)   SpO2 96%   BMI 25.00 kg/m?  ?Gen: NAD, resting comfortably ?HEENT: Mucous membranes are moist. Oropharynx normal. See separate note about ear ?Neck: no thyromegaly ?CV: RRR no murmurs rubs or gallops ?Lungs: CTAB no crackles, wheeze, rhonchi ?Abdomen: soft/nontender/nondistended/normal bowel sounds. No rebound or guarding.  ?Ext: no edema ?Skin: warm, dry ?Neuro: grossly normal, moves all extremities, PERRLA ? ?  ?Assessment and Plan  ?76 y.o. male presenting for annual physical.  ?Health Maintenance counseling: ?1. Anticipatory  guidance: Patient counseled regarding regular dental exams -q6 months- more lately due to bridge, eye exams -around yearly- hinks due,  avoiding smoking and second hand smoke , limiting alcohol to 2 beverages per day

## 2022-03-02 ENCOUNTER — Other Ambulatory Visit: Payer: Self-pay | Admitting: Family Medicine

## 2022-03-04 ENCOUNTER — Other Ambulatory Visit: Payer: Self-pay | Admitting: Family Medicine

## 2022-03-05 ENCOUNTER — Ambulatory Visit (INDEPENDENT_AMBULATORY_CARE_PROVIDER_SITE_OTHER): Payer: Medicare PPO | Admitting: Family Medicine

## 2022-03-05 ENCOUNTER — Encounter: Payer: Self-pay | Admitting: Family Medicine

## 2022-03-05 VITALS — BP 110/74 | HR 72 | Temp 98.2°F | Ht 68.0 in | Wt 164.4 lb

## 2022-03-05 DIAGNOSIS — R351 Nocturia: Secondary | ICD-10-CM

## 2022-03-05 DIAGNOSIS — Z0000 Encounter for general adult medical examination without abnormal findings: Secondary | ICD-10-CM

## 2022-03-05 DIAGNOSIS — H60501 Unspecified acute noninfective otitis externa, right ear: Secondary | ICD-10-CM | POA: Diagnosis not present

## 2022-03-05 DIAGNOSIS — I700 Atherosclerosis of aorta: Secondary | ICD-10-CM

## 2022-03-05 DIAGNOSIS — I10 Essential (primary) hypertension: Secondary | ICD-10-CM | POA: Diagnosis not present

## 2022-03-05 DIAGNOSIS — I4891 Unspecified atrial fibrillation: Secondary | ICD-10-CM

## 2022-03-05 DIAGNOSIS — N401 Enlarged prostate with lower urinary tract symptoms: Secondary | ICD-10-CM | POA: Diagnosis not present

## 2022-03-05 DIAGNOSIS — I5022 Chronic systolic (congestive) heart failure: Secondary | ICD-10-CM

## 2022-03-05 DIAGNOSIS — E785 Hyperlipidemia, unspecified: Secondary | ICD-10-CM

## 2022-03-05 DIAGNOSIS — I4892 Unspecified atrial flutter: Secondary | ICD-10-CM

## 2022-03-05 DIAGNOSIS — Z0001 Encounter for general adult medical examination with abnormal findings: Secondary | ICD-10-CM

## 2022-03-05 DIAGNOSIS — I2510 Atherosclerotic heart disease of native coronary artery without angina pectoris: Secondary | ICD-10-CM | POA: Diagnosis not present

## 2022-03-05 DIAGNOSIS — G4700 Insomnia, unspecified: Secondary | ICD-10-CM

## 2022-03-05 DIAGNOSIS — R739 Hyperglycemia, unspecified: Secondary | ICD-10-CM | POA: Diagnosis not present

## 2022-03-05 MED ORDER — BENAZEPRIL HCL 5 MG PO TABS: 5.0000 mg | ORAL_TABLET | Freq: Every day | ORAL | 3 refills | Status: DC

## 2022-03-05 MED ORDER — HYDROCORTISONE-ACETIC ACID 1-2 % OT SOLN: 4.0000 [drp] | Freq: Two times a day (BID) | OTIC | 0 refills | Status: DC

## 2022-03-05 NOTE — Progress Notes (Signed)
?Phone [phone] ?In person visit ?  ?Subjective:  ? ?[name] is a 76 y.o. year old very pleasant male patient who presents for/with See problem oriented charting ? ?This visit occurred during the SARS-CoV-2 public health emergency.  Safety protocols were in place, including screening questions prior to the visit, additional usage of staff PPE, and extensive cleaning of exam room while observing appropriate contact time as indicated for disinfecting solutions.  ? ?Past Medical History-  ?Patient Active Problem List  ? Diagnosis Date Noted  ? Paroxysmal atrial fibrillation ([place]) 07/27/2019  ?  Priority: High  ? S/P cardiac cath 06/07/2019  ?  Priority: High  ? CAD (coronary artery disease) 05/26/2016  ?  Priority: High  ? Nonischemic cardiomyopathy ([place]) 04/23/2016  ?  Priority: High  ? Chronic systolic heart failure ([place]) 12/01/2015  ?  Priority: High  ? CVA (cerebral infarction) 01/30/2015  ?  Priority: High  ? Atrial fibrillation and flutter ([place]) 01/01/2015  ?  Priority: High  ? Ventricular tachycardia, non-sustained ([place]) 01/25/2013  ?  Priority: High  ? Cardiac pacemaker in situ 12/28/2009  ?  Priority: High  ? Aortic atherosclerosis ([place]) 08/01/2020  ?  Priority: Medium   ? Fatty liver 08/01/2020  ?  Priority: Medium   ? Double vision 07/04/2018  ?  Priority: Medium   ? Hyperglycemia 06/10/2017  ?  Priority: Medium   ? Insomnia 01/30/2015  ?  Priority: Medium   ? BPH associated with nocturia 08/14/2014  ?  Priority: Medium   ? Gout 11/11/2009  ?  Priority: Medium   ? Hyperlipidemia 07/12/2007  ?  Priority: Medium   ? NEUROPATHY, ISCHEMIC OPTIC 06/02/2007  ?  Priority: Medium   ? Essential hypertension 06/02/2007  ?  Priority: Medium   ? Anticoagulation adequate 06/07/2019  ?  Priority: Low  ? Convergence insufficiency 06/07/2019  ?  Priority: Low  ? Fitting or adjustment of cardiac pacemaker 06/07/2019  ?  Priority: Low  ? Pacemaker-dependent due to native cardiac rhythm insufficient to support  life 06/07/2019  ?  Priority: Low  ? Palpitations 06/07/2019  ?  Priority: Low  ? History of nuclear stress test 06/07/2019  ?  Priority: Low  ? Non-occlusive coronary artery disease 06/07/2019  ?  Priority: Low  ? Trigger finger, right middle finger 03/01/2018  ?  Priority: Low  ? Solitary pulmonary nodule 05/26/2016  ?  Priority: Low  ? Chest pressure 12/01/2015  ?  Priority: Low  ? Left chest pressure 03/19/2015  ?  Priority: Low  ? Erectile dysfunction 01/30/2015  ?  Priority: Low  ? Former smoker 01/30/2015  ?  Priority: Low  ? Allergic rhinitis 10/11/2014  ?  Priority: Low  ? Sinoatrial node dysfunction (HCC)   ?  Priority: Low  ? SPINAL STENOSIS, LUMBAR 08/22/2010  ?  Priority: Low  ? GERD 03/17/2010  ?  Priority: Low  ? ? ?Medications- reviewed and updated ?Current Outpatient Medications  ?Medication Sig Dispense Refill  ? acetic acid-hydrocortisone (VOSOL-HC) OTIC solution Place 4 drops into the right ear 2 (two) times daily. Apply to affected ear. For ear canal irritation/otitis externa. For 10 days 10 mL 0  ? cyclobenzaprine (FLEXERIL) 5 MG tablet Take 1 tablet (5 mg total) by mouth 3 (three) times daily as needed for muscle spasms. 30 tablet 2  ? finasteride (PROSCAR) 5 MG tablet Take 1 tablet (5 mg total) by mouth daily. 90 tablet 3  ? gemfibrozil (LOPID) 600 MG  tablet TAKE 1 TABLET BY MOUTH TWICE DAILY BEFORE  A  MEAL 180 tablet 3  ? icosapent Ethyl (VASCEPA) 1 g capsule Take 1 g by mouth 2 (two) times daily.    ? indomethacin (INDOCIN) 25 MG capsule Take 1 capsule (25 mg total) by mouth 2 (two) times daily as needed (pain). Take with a meal. 3 days maximun 30 capsule 0  ? metoprolol succinate (TOPROL-XL) 25 MG 24 hr tablet Take 1 tablet (25 mg total) by mouth daily. 90 tablet 3  ? Multiple Vitamin (MULTIVITAMIN) tablet Take 1 tablet by mouth daily.    ? omeprazole (PRILOSEC) 20 MG capsule Take 1 capsule by mouth once daily 90 capsule 0  ? rosuvastatin (CRESTOR) 40 MG tablet Take 1 tablet by mouth once  daily 90 tablet 0  ? tadalafil (CIALIS) 5 MG tablet TAKE 1 TABLET BY MOUTH DAILY AS NEEDED FOR ERECTILE DYSFUNCTION 90 tablet 3  ? XARELTO 20 MG TABS tablet TAKE 1 TABLET BY MOUTH ONCE DAILY WITH SUPPER 90 tablet 2  ? zolpidem (AMBIEN) 5 MG tablet TAKE 1 TABLET BY MOUTH AT BEDTIME 90 tablet 0  ? benazepril (LOTENSIN) 5 MG tablet Take 1 tablet (5 mg total) by mouth daily. 90 tablet 3  ? ?No current facility-administered medications for this visit.  ? ?  ?Objective:  ?BP 110/74   Pulse 72   Temp 98.2 ?F (36.8 ?C)   Ht '5\' 8"'$  (1.727 m)   Wt 164 lb 6.4 oz (74.6 kg)   SpO2 96%   BMI 25.00 kg/m?  ?Gen: NAD, resting comfortably ?Left TM normal, Right TM out of view- removed impaction and noted some erythema in canal with mild discharge- slight cloudiness of tympanic membrane ?  ? ?Assessment and Plan  ? ?# ear discomfort in right ear ?S:  does swim a lot and does feel clogged- alcohol usually helps.noted some mild annoyance/discomfort in that ear. More trouble hearing. More itching noted.  ?A/P: on exam noted a cerumen impaction- this was removed but behind this the canal was red/irritated with mild discharge- we opted to treat with  vosol-HC 4 drops twice a day for 10 days. No swimming during this time then can restart ?- wait on hearing testing until ear issues resolved ?-did note hearing improvement with cerumen removal  ? ?Recommended follow up: sooner if needed if doesn't improve ?Future Appointments  ?Date Time Provider [place]  ?05/21/2022  2:40 PM [id number]  ? ?Lab/Order associations: ?  ICD-10-CM   ?1. Acute non-infective otitis externa of right ear, unspecified type  H60.501   ?  ? ?Meds ordered this encounter  ?Medications  ? acetic acid-hydrocortisone (VOSOL-HC) OTIC solution  ?  Sig: Place 4 drops into the right ear 2 (two) times daily. Apply to affected ear. For ear canal irritation/otitis externa. For 10 days  ?  Dispense:  10 mL  ?  Refill:  0  ? ?Return  precautions advised.  ?Garret Reddish, MD ? ? ?

## 2022-03-05 NOTE — Patient Instructions (Addendum)
Schedule a lab visit at the check out desk within 2 weeks. Return for future fasting labs meaning nothing but water after midnight please. Ok to take your medications with water.  ? ?Please check with your pharmacy to see if they have the shingrix vaccine. If they do- please get this immunization and update [place] by phone call or mychart with dates you receive the vaccine ? ?Recommend Tdap at pharmacy ? ?on exam noted a cerumen impaction- this was removed but behind this the canal was red/irritated with mild discharge- we opted to treat with  vosol-HC 4 drops twice a day for 10 days. No swimming during this time then can restart ?- wait on hearing testing until ear issues resolved ? ? ?Recommended follow up: Return in about 6 months (around 09/04/2022) for followup or sooner if needed.Schedule b4 you leave. ?

## 2022-03-09 ENCOUNTER — Other Ambulatory Visit (INDEPENDENT_AMBULATORY_CARE_PROVIDER_SITE_OTHER): Payer: Medicare PPO

## 2022-03-09 DIAGNOSIS — R351 Nocturia: Secondary | ICD-10-CM

## 2022-03-09 DIAGNOSIS — R739 Hyperglycemia, unspecified: Secondary | ICD-10-CM | POA: Diagnosis not present

## 2022-03-09 DIAGNOSIS — N401 Enlarged prostate with lower urinary tract symptoms: Secondary | ICD-10-CM | POA: Diagnosis not present

## 2022-03-09 DIAGNOSIS — E785 Hyperlipidemia, unspecified: Secondary | ICD-10-CM | POA: Diagnosis not present

## 2022-03-09 LAB — LIPID PANEL
Cholesterol: 148 mg/dL (ref 0–200)
HDL: 43.3 mg/dL (ref >39.00)
LDL Cholesterol: 65 mg/dL (ref 0–99)
NonHDL: 104.73
Total CHOL/HDL Ratio: 3
Triglycerides: 198 mg/dL — ABNORMAL HIGH (ref 0.0–149.0)
VLDL: 39.6 mg/dL (ref 0.0–40.0)

## 2022-03-09 LAB — CBC WITH DIFFERENTIAL/PLATELET
Basophils Absolute: 0 10*3/uL (ref 0.0–0.1)
Basophils Relative: 0.7 % (ref 0.0–3.0)
Eosinophils Absolute: 0.2 10*3/uL (ref 0.0–0.7)
Eosinophils Relative: 3.8 % (ref 0.0–5.0)
HCT: 42.7 % (ref 39.0–52.0)
Hemoglobin: 14.3 g/dL (ref 13.0–17.0)
Lymphocytes Relative: 35.5 % (ref 12.0–46.0)
Lymphs Abs: 2.2 10*3/uL (ref 0.7–4.0)
MCHC: 33.4 g/dL (ref 30.0–36.0)
MCV: 88.3 fl (ref 78.0–100.0)
Monocytes Absolute: 0.6 10*3/uL (ref 0.1–1.0)
Monocytes Relative: 9.9 % (ref 3.0–12.0)
Neutro Abs: 3.1 10*3/uL (ref 1.4–7.7)
Neutrophils Relative %: 50.1 % (ref 43.0–77.0)
Platelets: 231 10*3/uL (ref 150.0–400.0)
RBC: 4.84 Mil/uL (ref 4.22–5.81)
RDW: 14.2 % (ref 11.5–15.5)
WBC: 6.3 10*3/uL (ref 4.0–10.5)

## 2022-03-09 LAB — COMPREHENSIVE METABOLIC PANEL
ALT: 12 U/L (ref 0–53)
AST: 15 U/L (ref 0–37)
Albumin: 4.5 g/dL (ref 3.5–5.2)
Alkaline Phosphatase: 74 U/L (ref 39–117)
BUN: 15 mg/dL (ref 6–23)
CO2: 28 mEq/L (ref 19–32)
Calcium: 9.4 mg/dL (ref 8.4–10.5)
Chloride: 104 mEq/L (ref 96–112)
Creatinine, Ser: 1.06 mg/dL (ref 0.40–1.50)
GFR: 68.71 mL/min (ref >60.00)
Glucose, Bld: 114 mg/dL — ABNORMAL HIGH (ref 70–99)
Potassium: 4 mEq/L (ref 3.5–5.1)
Sodium: 140 mEq/L (ref 135–145)
Total Bilirubin: 0.6 mg/dL (ref 0.2–1.2)
Total Protein: 6.9 g/dL (ref 6.0–8.3)

## 2022-03-09 LAB — HEMOGLOBIN A1C: Hgb A1c MFr Bld: 6.1 % (ref 4.6–6.5)

## 2022-03-09 LAB — PSA: PSA: 1.19 ng/mL (ref 0.10–4.00)

## 2022-03-16 ENCOUNTER — Encounter: Payer: Self-pay | Admitting: Internal Medicine

## 2022-03-17 ENCOUNTER — Encounter: Payer: Self-pay | Admitting: Family Medicine

## 2022-03-23 ENCOUNTER — Telehealth: Payer: Self-pay | Admitting: Internal Medicine

## 2022-03-23 NOTE — Telephone Encounter (Signed)
? ?  Pre-operative Risk Assessment  ?  ?Patient Name: [name]  ?DOB: [birth year] ?MRN: [id number]  ? ? ? ?Request for Surgical Clearance   ? ?Procedure:  Dental Extraction - Amount of Teeth to be Pulled:  1 (Tooth #31) ? ?Date of Surgery:  Clearance TBD                              ?   ?Surgeon:  Dr. Mathis Fare ?Surgeon's Group or Practice Name:  Dr. Mathis Fare D[facility] ( private dental practice) ?Phone number: [phone] ?Fax number:  [phone] ?  ?Type of Clearance Requested:   ?- Medical  ?- Pharmacy:  Hold Xarelto TBD by Cardiology ?  ?Type of Anesthesia:  numbing of extraction area  ?  ?Additional requests/questions:   ? ?Signed, ?Johnna Acosta   ?03/23/2022, 9:39 AM   ?

## 2022-03-23 NOTE — Telephone Encounter (Signed)
? ?  Patient Name: [name]  ?DOB: 1946-03-01 ?MRN: [id number] ? ?Primary Cardiologist: Cristopher Peru, MD ?Also follows with [facility] ? ?Chart reviewed as part of pre-operative protocol coverage.  ? ?Dental extractions of one or two teeth are considered low risk procedures per guidelines and generally do not require any specific cardiac clearance. It is also generally accepted that for simple extractions and dental cleanings, there is no need to interrupt blood thinner therapy. ? ? ?SBE prophylaxis is not required for the patient from a cardiac standpoint. ? ?I will route this recommendation to the requesting party via Epic fax function and remove from pre-op pool. ? ?Please call with questions. ? ?Ledora Bottcher, PA ?03/23/2022, 12:30 PM ? ?

## 2022-03-24 DIAGNOSIS — Z01110 Encounter for hearing examination following failed hearing screening: Secondary | ICD-10-CM | POA: Diagnosis not present

## 2022-04-06 ENCOUNTER — Encounter: Payer: Self-pay | Admitting: Family Medicine

## 2022-04-06 ENCOUNTER — Ambulatory Visit: Payer: Medicare PPO | Admitting: Family Medicine

## 2022-04-06 VITALS — BP 125/83 | HR 71 | Temp 98.2°F | Ht 68.0 in | Wt 167.0 lb

## 2022-04-06 DIAGNOSIS — L570 Actinic keratosis: Secondary | ICD-10-CM | POA: Diagnosis not present

## 2022-04-06 DIAGNOSIS — H61893 Other specified disorders of external ear, bilateral: Secondary | ICD-10-CM

## 2022-04-06 NOTE — Progress Notes (Signed)
? ?  [name] is a 76 y.o. male who presents today for an office visit. ? ?Assessment/Plan:  ?Actinic Keratosis ?Discussed precancerous nature and and treatment options.  Recommended cryotherapy.  He agreed to this.  See below procedure note.  He tolerated well. ? ?Ear Canal Irritation ?Possibly related to hearing aid placement.  No signs of inflammation or infection.  He can try using a little bit of topical cortisone cream to the area.  He will let us know if not improving. ? ? ?  ?Subjective:  ?HPI: ? ?Patient here with 2 concerns today ? ?He had a "bump" on his right hand that fell off over the last few days.  This has been a recurring issue for the last several months.  Described as a rough patch that will grow larger and then fall off.  He had similar patch on the top of his head as well. ? ?Is also had itching in bilateral ears.  He had hearing aids placed last week.  Since then he has noticed intermittent itching and irritation to his ear canals. ? ?   ?  ?Objective:  ?Physical Exam: ?BP 125/83 (BP Location: Left Arm)   Pulse 71   Temp 98.2 ?F (36.8 ?C) (Temporal)   Ht '5\' 8"'$  (1.727 m)   Wt 167 lb (75.8 kg)   SpO2 96%   BMI 25.39 kg/m?   ?Gen: No acute distress, resting comfortably ?HEENT: Bilateral EACs with xerosis cutis. ?Skin: Actinic keratosis on dorsal aspect of right hand and anterior scalp ?Neuro: Grossly normal, moves all extremities ?Psych: Normal affect and thought content ? ?Cryotherapy Procedure Note ? ?Pre-operative Diagnosis: Actinic keratosis ? ?Locations: Right hand, scalp ? ?Indications: Therapeutic ? ?Procedure Details  ?Patient informed of risks (permanent scarring, infection, light or dark discoloration, bleeding, infection, weakness, numbness and recurrence of the lesion) and benefits of the procedure and verbal informed consent obtained. ? ?The areas are treated with liquid nitrogen therapy, frozen until ice ball extended 3 mm beyond lesion, allowed to thaw, and treated  again. The patient tolerated procedure well.  A total of 2 lesions were treated.  The patient was instructed on post-op care, warned that there may be blister formation, redness and Higgins. Recommend OTC analgesia as needed for Higgins. ? ?Condition: ?Stable ? ?Complications: ?none. ? ? ?   ? ?[name]. Nathan Pain, MD ?04/06/2022 10:40 AM  ?

## 2022-04-06 NOTE — Patient Instructions (Signed)
It was very nice to see you today! ? ?You have actinic keratoses.  These are precancerous.  We froze 2 spots today. ? ?You have slight dry skin and irritation in your ear canal.  You can use a very small amount of hydrocortisone cream to the areas if this helps.  If this does not improve please let us know. ? ?Take care, ?Dr Jerline Pain ? ?PLEASE NOTE: ? ?If you had any lab tests please let us know if you have not heard back within a few days. You may see your results on mychart before we have a chance to review them but we will give you a call once they are reviewed by [place]. If we ordered any referrals today, please let us know if you have not heard from their office within the next week.  ? ?Please try these tips to maintain a healthy lifestyle: ? ?Eat at least 3 REAL meals and 1-2 snacks per day.  Aim for no more than 5 hours between eating.  If you eat breakfast, please do so within one hour of getting up.  ? ?Each meal should contain half fruits/vegetables, one quarter protein, and one quarter carbs (no bigger than a computer mouse) ? ?Cut down on sweet beverages. This includes juice, soda, and sweet tea.  ? ?Drink at least 1 glass of water with each meal and aim for at least 8 glasses per day ? ?Exercise at least 150 minutes every week.   ?

## 2022-04-08 ENCOUNTER — Other Ambulatory Visit: Payer: Self-pay | Admitting: Family Medicine

## 2022-05-11 ENCOUNTER — Encounter: Payer: Self-pay | Admitting: Family Medicine

## 2022-05-14 ENCOUNTER — Encounter: Payer: Self-pay | Admitting: Family Medicine

## 2022-05-14 ENCOUNTER — Telehealth (INDEPENDENT_AMBULATORY_CARE_PROVIDER_SITE_OTHER): Payer: Medicare PPO | Admitting: Family Medicine

## 2022-05-14 VITALS — Ht 68.0 in | Wt 168.0 lb

## 2022-05-14 DIAGNOSIS — M7989 Other specified soft tissue disorders: Secondary | ICD-10-CM | POA: Diagnosis not present

## 2022-05-14 DIAGNOSIS — I480 Paroxysmal atrial fibrillation: Secondary | ICD-10-CM

## 2022-05-14 NOTE — Progress Notes (Signed)
Phone [phone] Virtual visit via Video note   Subjective:  Chief complaint: Chief Complaint  Patient presents with   ankle swelling    Pt c/o ankle swelling in right ankle that he noticed 5 days ago and denies pain but it is kind of tender and his foot is swollen as well.     This visit type was conducted due to national recommendations for restrictions regarding the COVID-19 Pandemic (e.g. social distancing).  This format is felt to be most appropriate for this patient at this time balancing risks to patient and risks to population by having him in for in person visit.  No physical exam was performed (except for noted visual exam or audio findings with Telehealth visits).    Our team/I connected with Hermina Barters at 11:20 AM EDT by a video enabled telemedicine application (doxy.me or caregility through epic) and verified that I am speaking with the correct person using two identifiers.  Location patient: [place] Location provider: [facility], office Persons participating in the virtual visit:  patient  Our team/I discussed the limitations of evaluation and management by telemedicine and the availability of in person appointments. In light of current covid-19 pandemic, patient also understands that we are trying to protect them by minimizing in office contact if at all possible.  The patient expressed consent for telemedicine visit and agreed to proceed. Patient understands insurance will be billed.   Past Medical History-  Patient Active Problem List   Diagnosis Date Noted   Paroxysmal atrial fibrillation ([place]) 07/27/2019    Priority: High   S/P cardiac cath 06/07/2019    Priority: High   CAD (coronary artery disease) 05/26/2016    Priority: High   Nonischemic cardiomyopathy ([place]) 04/23/2016    Priority: High   Chronic systolic heart failure ([place]) 12/01/2015    Priority: High   CVA (cerebral infarction) 01/30/2015    Priority: High   Atrial fibrillation and flutter  ([place]) 01/01/2015    Priority: High   Ventricular tachycardia, non-sustained ([place]) 01/25/2013    Priority: High   Cardiac pacemaker in situ 12/28/2009    Priority: High   Aortic atherosclerosis ([place]) 08/01/2020    Priority: Medium    Fatty liver 08/01/2020    Priority: Medium    Double vision 07/04/2018    Priority: Medium    Hyperglycemia 06/10/2017    Priority: Medium    Insomnia 01/30/2015    Priority: Medium    BPH associated with nocturia 08/14/2014    Priority: Medium    Gout 11/11/2009    Priority: Medium    Hyperlipidemia 07/12/2007    Priority: Medium    NEUROPATHY, ISCHEMIC OPTIC 06/02/2007    Priority: Medium    Essential hypertension 06/02/2007    Priority: Medium    Anticoagulation adequate 06/07/2019    Priority: Low   Convergence insufficiency 06/07/2019    Priority: Low   Fitting or adjustment of cardiac pacemaker 06/07/2019    Priority: Low   Pacemaker-dependent due to native cardiac rhythm insufficient to support life 06/07/2019    Priority: Low   Palpitations 06/07/2019    Priority: Low   History of nuclear stress test 06/07/2019    Priority: Low   Non-occlusive coronary artery disease 06/07/2019    Priority: Low   Trigger finger, right middle finger 03/01/2018    Priority: Low   Solitary pulmonary nodule 05/26/2016    Priority: Low   Chest pressure 12/01/2015    Priority: Low   Left chest pressure  03/19/2015    Priority: Low   Erectile dysfunction 01/30/2015    Priority: Low   Former smoker 01/30/2015    Priority: Low   Allergic rhinitis 10/11/2014    Priority: Low   Sinoatrial node dysfunction [facility])     Priority: Low   SPINAL STENOSIS, LUMBAR 08/22/2010    Priority: Low   GERD 03/17/2010    Priority: Low    Medications- reviewed and updated Current Outpatient Medications  Medication Sig Dispense Refill   acetic acid-hydrocortisone (VOSOL-HC) OTIC solution Place 4 drops into the right ear 2 (two) times daily. Apply to affected ear.  For ear canal irritation/otitis externa. For 10 days 10 mL 0   benazepril (LOTENSIN) 5 MG tablet Take 1 tablet (5 mg total) by mouth daily. 90 tablet 3   cyclobenzaprine (FLEXERIL) 5 MG tablet Take 1 tablet (5 mg total) by mouth 3 (three) times daily as needed for muscle spasms. 30 tablet 2   finasteride (PROSCAR) 5 MG tablet Take 1 tablet (5 mg total) by mouth daily. 90 tablet 3   gemfibrozil (LOPID) 600 MG tablet TAKE 1 TABLET BY MOUTH TWICE DAILY BEFORE  A  MEAL 180 tablet 3   icosapent Ethyl (VASCEPA) 1 g capsule Take 1 g by mouth 2 (two) times daily.     indomethacin (INDOCIN) 25 MG capsule Take 1 capsule (25 mg total) by mouth 2 (two) times daily as needed (pain). Take with a meal. 3 days maximun 30 capsule 0   metoprolol succinate (TOPROL-XL) 25 MG 24 hr tablet Take 1 tablet (25 mg total) by mouth daily. 90 tablet 3   Multiple Vitamin (MULTIVITAMIN) tablet Take 1 tablet by mouth daily.     omeprazole (PRILOSEC) 20 MG capsule Take 1 capsule by mouth once daily 90 capsule 0   rosuvastatin (CRESTOR) 40 MG tablet Take 1 tablet by mouth once daily 90 tablet 0   tadalafil (CIALIS) 5 MG tablet TAKE 1 TABLET BY MOUTH DAILY AS NEEDED FOR ERECTILE DYSFUNCTION 90 tablet 3   XARELTO 20 MG TABS tablet TAKE 1 TABLET BY MOUTH ONCE DAILY WITH SUPPER 90 tablet 2   zolpidem (AMBIEN) 5 MG tablet TAKE 1 TABLET BY MOUTH AT BEDTIME 90 tablet 0   No current facility-administered medications for this visit.     Objective:  Ht '5\' 8"'$  (1.727 m)   Wt 168 lb (76.2 kg)   BMI 25.54 kg/m  self reported vitals Gen: NAD, resting comfortably Lungs: nonlabored, normal respiratory rate  Skin: appears dry, no obvious rash, right lower leg appears swollen compared to left, some scaling of skin- patient reports tenderness to touch      Assessment and Plan   # Right leg and foot edema S:patient was away on the 10th and was walking around [place] and had 5 miles of walking- at end of day- noted foot from toes and  halfway up calf was considerably swollen. Took tylenol and iced it . Had a day or two of left leg swelling but not swollen as well  Also noted worsening of a baseline scaly rash on both outter foot and medial portion of ankle. Legs felt more tender up to mid leg   No chest pain other than twinge of pressure on cruise but was eating and drinking very well on cruise. No exertional pain. No shortness of breath.  A/P: Patient with right leg and foot edema after recent travel-he is on Xarelto which makes DVT less likely but given unilateral distribution of swelling  we opted to proceed with stat duplex to rule out DVT. If did have DVT- could consider treatment course for DVT xarelto 15 mg BID or changing to alternate agent -No shortness of breath reported- discussed if worsening symptoms to seek care in ED - we discussed would have preferred to do visit in person but he was only offered virtual by staff- I apologized about this  #Atrial fibrillation-in regards atrial fibrillation appropriately anticoagulated with Xarelto and rate controlled with metoprolol as far as we are aware-no reported increased heart rate-continue current medication  Recommended follow up: As needed for acute concerns Future Appointments  Date Time Provider [place]  05/21/2022  2:40 PM [facility] DEVICE 1 [facility]  03/08/2023  9:00 AM Marin Olp, MD [facility]   Lab/Order associations:   ICD-10-CM   1. Right leg swelling  M79.89 VAS [place] LOWER EXTREMITY VENOUS (DVT)    2. Paroxysmal atrial fibrillation (HCC)  I48.0     Stat DVT-we discussed this should be done either today or tomorrow at the latest  No orders of the defined types were placed in this encounter.   Return precautions advised.  Garret Reddish, MD

## 2022-05-15 ENCOUNTER — Encounter: Payer: Self-pay | Admitting: Family Medicine

## 2022-05-15 ENCOUNTER — Telehealth: Payer: Self-pay | Admitting: Family Medicine

## 2022-05-15 ENCOUNTER — Ambulatory Visit (HOSPITAL_COMMUNITY)
Admission: RE | Admit: 2022-05-15 | Discharge: 2022-05-15 | Disposition: A | Discharge status: Home / Self Care | Payer: Medicare PPO | Source: Ambulatory Visit | Attending: Family Medicine | Admitting: Family Medicine

## 2022-05-15 DIAGNOSIS — M7989 Other specified soft tissue disorders: Secondary | ICD-10-CM

## 2022-05-15 NOTE — Telephone Encounter (Signed)
Called to give report.    Was not able to hold.  States will send report through Epic.

## 2022-05-19 ENCOUNTER — Encounter: Payer: Self-pay | Admitting: Family Medicine

## 2022-05-19 ENCOUNTER — Telehealth: Payer: Self-pay | Admitting: Family Medicine

## 2022-05-19 ENCOUNTER — Ambulatory Visit: Payer: Medicare PPO | Admitting: Family Medicine

## 2022-05-19 VITALS — BP 124/72 | HR 64 | Temp 98.5°F | Ht 68.0 in | Wt 168.0 lb

## 2022-05-19 DIAGNOSIS — M79671 Pain in right foot: Secondary | ICD-10-CM | POA: Diagnosis not present

## 2022-05-19 MED ORDER — DOXYCYCLINE HYCLATE 100 MG PO TABS: 100.0000 mg | ORAL_TABLET | Freq: Two times a day (BID) | ORAL | 0 refills | Status: DC

## 2022-05-19 MED ORDER — COLCHICINE 0.6 MG PO CAPS: ORAL_CAPSULE | ORAL | 0 refills | Status: DC

## 2022-05-19 MED ORDER — DOXYCYCLINE HYCLATE 100 MG PO TABS
100.0000 mg | ORAL_TABLET | Freq: Two times a day (BID) | ORAL | 0 refills | Status: DC
Start: 2022-05-19 — End: 2022-05-19

## 2022-05-19 NOTE — Telephone Encounter (Signed)
Rx's sent to [facility]. Patient notified and verbalized understanding.

## 2022-05-19 NOTE — Addendum Note (Signed)
Addended by: Zacarias Pontes on: 05/19/2022 01:16 PM   Modules accepted: Orders

## 2022-05-19 NOTE — Patient Instructions (Signed)
Try the colchicine first  If not improving/worse-then add the doxycycline

## 2022-05-19 NOTE — Telephone Encounter (Signed)
Pt states pharmacy is out of one of two medications prescribed today (06/20)   Pt requests the follow (two) medications to be resent to the pharmacy listed below.  Colchicine 0.6 MG CAPS [[id number]]   doxycycline (VIBRA-TABS) 100 MG tablet [[id number]]    Pharmacy at [facility] [address]  [place], [place] [address] [phone]

## 2022-05-19 NOTE — Progress Notes (Signed)
Subjective:     Patient ID: [name], male    DOB: 1946/10/26, 76 y.o.   MRN: [id number]  Chief Complaint  Patient presents with   Leg Pain    Right Leg pain that started last week Radiates from the knee down, soreness in foot has gotten worse    HPI R leg pain for 1 wk.  Rad from knee to foot.  Pain in foot getting worse..  seen last wk-doppler neg for DVT.  Getting some intermitt bumps on leg Whole area of top of foot across base of toes very tender and up to lower leg.  Swelling.  No h/o gout.  Pain to put pressure on foot, but no pain at rest.   Had walked 6 miles in [place] on 6/10(was on cruise and ate/drank a lot).  And R leg was swollen.  Foot pain began this weekend.   No injury Also, weeding garden recently  There are no preventive care reminders to display for this patient.  Past Medical History:  Diagnosis Date   Abscess    right posterior neck   Anticoagulation adequate 06/07/2019   Overview:  SSS, paroxAFib/Flutter, CHADSvasc= 3 age, h/o cva; chronic [place] Xarelto   Atrial fibrillation and flutter ([place]) 01/01/2015   Noted 2016. Xarelto    BPH associated with nocturia 08/14/2014   Finasteride, flomax.  0-3x nocturia. PSA to 14-->eval urology [facility]. PSA trended back down to about 2.6 per records in 2015 then placed on finasteride- had significant voiding issues when spiked to 14- improved on meds    Bradycardia    CAD (coronary artery disease) 05/26/2016   Coronary CTA_ 50-75% D1 off CT report    Cardiac pacemaker in situ 12/28/2009   SA node dysfunction as cause    Chest pressure 12/01/2015   Overview:  atypical CP, [facility] nuclear perfusion imaging w/ no inarct, no ischemia, EF-40% NICM   Chronic systolic heart failure ([place]) 12/01/2015   Overview:  [facility] nuclear perfusion imaging study, no inarct, no ischemia, EF-40% NICM   CVA (cerebral vascular accident) ([place])    Double vision 07/04/2018   Elevated LFTs    Erectile dysfunction 01/30/2015   cialis prn    External  hemorrhoids    Fitting or adjustment of cardiac pacemaker 06/07/2019   Overview:  [facility] [id number], RA and RV leads [id number]:  [id number] and [id number]   Former smoker 01/30/2015   Former smoker. 20 pack years quit 92. Patient declined AAA screen.     GERD (gastroesophageal reflux disease)    Gout 11/11/2009   Qualifier: Diagnosis of  By: Lynden Ang     History of nuclear stress test 06/07/2019   Overview:  [facility], no inarct, no ischemia, EF-40% NICM   HTN (hypertension)    Hyperglycemia 06/10/2017   110s CBG 2018   Hyperlipidemia 07/12/2007   Crestor '20mg'$  daily with some myalgias; fish oil. Trig 200-500 despite this. LDL ok.        Insomnia 01/30/2015   ambien '5mg'$  prn. May refill.     Ischemic optic neuropathy    Left chest pressure 03/19/2015   Other and unspecified hyperlipidemia    Sinoatrial node dysfunction (HCC)    Spinal stenosis    Trigger finger, right middle finger 03/01/2018    Past Surgical History:  Procedure Laterality Date   ABSCESS DRAINAGE     righ tposterior neck   CARDIAC CATHETERIZATION  1996   CHOLECYSTECTOMY  02/07/2003   COLONOSCOPY  06/23/2004  EYE SURGERY     left eye   left ingunial hernia  06/08/2011   PACEMAKER INSERTION     right hip replacement     2012 Dr. Maureen Ralphs    Outpatient Medications Prior to Visit  Medication Sig Dispense Refill   acetic acid-hydrocortisone (VOSOL-HC) OTIC solution Place 4 drops into the right ear 2 (two) times daily. Apply to affected ear. For ear canal irritation/otitis externa. For 10 days 10 mL 0   benazepril (LOTENSIN) 5 MG tablet Take 1 tablet (5 mg total) by mouth daily. 90 tablet 3   cyclobenzaprine (FLEXERIL) 5 MG tablet Take 1 tablet (5 mg total) by mouth 3 (three) times daily as needed for muscle spasms. 30 tablet 2   finasteride (PROSCAR) 5 MG tablet Take 1 tablet (5 mg total) by mouth daily. 90 tablet 3   gemfibrozil (LOPID) 600 MG tablet TAKE 1 TABLET BY MOUTH TWICE DAILY BEFORE  A  MEAL 180 tablet 3    icosapent Ethyl (VASCEPA) 1 g capsule Take 1 g by mouth 2 (two) times daily.     indomethacin (INDOCIN) 25 MG capsule Take 1 capsule (25 mg total) by mouth 2 (two) times daily as needed (pain). Take with a meal. 3 days maximun 30 capsule 0   metoprolol succinate (TOPROL-XL) 25 MG 24 hr tablet Take 1 tablet (25 mg total) by mouth daily. 90 tablet 3   Multiple Vitamin (MULTIVITAMIN) tablet Take 1 tablet by mouth daily.     omeprazole (PRILOSEC) 20 MG capsule Take 1 capsule by mouth once daily 90 capsule 0   rosuvastatin (CRESTOR) 40 MG tablet Take 1 tablet by mouth once daily 90 tablet 0   tadalafil (CIALIS) 5 MG tablet TAKE 1 TABLET BY MOUTH DAILY AS NEEDED FOR ERECTILE DYSFUNCTION 90 tablet 3   XARELTO 20 MG TABS tablet TAKE 1 TABLET BY MOUTH ONCE DAILY WITH SUPPER 90 tablet 2   zolpidem (AMBIEN) 5 MG tablet TAKE 1 TABLET BY MOUTH AT BEDTIME 90 tablet 0   No facility-administered medications prior to visit.    No Known Allergies ROS neg/noncontributory except as noted HPI/below      Objective:     BP 124/72   Pulse 64   Temp 98.5 F (36.9 C) (Temporal)   Ht '5\' 8"'$  (1.727 m)   Wt 168 lb (76.2 kg)   SpO2 96%   BMI 25.54 kg/m  Wt Readings from Last 3 Encounters:  05/19/22 168 lb (76.2 kg)  05/14/22 168 lb (76.2 kg)  04/06/22 167 lb (75.8 kg)    Physical Exam   Gen: WDWN NAD HEENT: NCAT, conjunctiva not injected, sclera nonicteric EXT:  no edema MSK: R foot edema-top of foot and some redness.  DP 2+.  TTP on top of base of 5th metatarsal but not on side.   Flakey skin.  R medial shin-approx 91m red bump.  Sl tender. NEURO: A&O x3.  CN II-XII intact.  PSYCH: normal mood. Good eye contact  Reviewed doppler  Spent 35 min getting hx, reviewing doppler/prev note/labs.  Discussing etiol/possibilities, tx     Assessment & Plan:   Problem List Items Addressed This Visit   None Visit Diagnoses     Right foot pain    -  Primary     R foot pain.  Reviewed labs-uric acid  has been 6.8-7.4.  this may be gout(pt on cruise and walking and a lot of seafood/alcohol).  Declines x-ray today.  Will do colchicine 0.'6mg'$ .  tylenol  for pain.  Can't do nsaid's d/t xarelto.  Dvt ruled out last wk. Handout for low purine diet given. Also, may be some cellulitis-flakey skin, poss bug bite on shin-so doxy '100mg'$  bid(can wait 1-2 days to take to see if colchicine helps.   New symptoms, changes, f/u  Meds ordered this encounter  Medications   doxycycline (VIBRA-TABS) 100 MG tablet    Sig: Take 1 tablet (100 mg total) by mouth 2 (two) times daily.    Dispense:  14 tablet    Refill:  0   Colchicine 0.6 MG CAPS    Sig: Day 1: Take 2 caps, then 1 cap an hour later. Day 2 and beyond: 1 cap daily    Dispense:  10 capsule    Refill:  0    Wellington Hampshire, MD

## 2022-05-21 ENCOUNTER — Ambulatory Visit (INDEPENDENT_AMBULATORY_CARE_PROVIDER_SITE_OTHER): Payer: Medicare PPO

## 2022-05-21 DIAGNOSIS — I495 Sick sinus syndrome: Secondary | ICD-10-CM | POA: Diagnosis not present

## 2022-05-23 LAB — CUP PACEART INCLINIC DEVICE CHECK
Date Time Interrogation Session: 20230624113200
Implantable Lead Implant Date: 20031017
Implantable Lead Implant Date: 20031017
Implantable Lead Location: 753859
Implantable Lead Location: 753860
Implantable Lead Model: 5076
Implantable Lead Model: 5076
Implantable Pulse Generator Implant Date: 20120210
Pulse Gen Serial Number: 66063261

## 2022-06-29 DIAGNOSIS — E782 Mixed hyperlipidemia: Secondary | ICD-10-CM | POA: Diagnosis not present

## 2022-06-29 DIAGNOSIS — I428 Other cardiomyopathies: Secondary | ICD-10-CM | POA: Diagnosis not present

## 2022-06-29 DIAGNOSIS — I480 Paroxysmal atrial fibrillation: Secondary | ICD-10-CM | POA: Diagnosis not present

## 2022-06-29 DIAGNOSIS — I25118 Atherosclerotic heart disease of native coronary artery with other forms of angina pectoris: Secondary | ICD-10-CM | POA: Diagnosis not present

## 2022-06-29 DIAGNOSIS — I2510 Atherosclerotic heart disease of native coronary artery without angina pectoris: Secondary | ICD-10-CM | POA: Diagnosis not present

## 2022-07-01 ENCOUNTER — Telehealth: Payer: Self-pay | Admitting: Internal Medicine

## 2022-07-01 NOTE — Telephone Encounter (Signed)
[facility] requesting copies from his last explorative check on pacemaker from Dr. Lovena Le. Requesting information be sent via fax:   # [phone]

## 2022-07-01 NOTE — Telephone Encounter (Signed)
Faxed/Routed last in-clinic check through Epic, patient does not do remotes.

## 2022-07-27 ENCOUNTER — Other Ambulatory Visit: Payer: Self-pay | Admitting: Family Medicine

## 2022-08-14 ENCOUNTER — Other Ambulatory Visit: Payer: Self-pay | Admitting: Family Medicine

## 2022-08-24 ENCOUNTER — Encounter: Payer: Self-pay | Admitting: *Deleted

## 2022-08-27 DIAGNOSIS — J398 Other specified diseases of upper respiratory tract: Secondary | ICD-10-CM | POA: Diagnosis not present

## 2022-09-15 ENCOUNTER — Other Ambulatory Visit: Payer: Self-pay | Admitting: Family Medicine

## 2022-10-24 ENCOUNTER — Other Ambulatory Visit: Payer: Self-pay | Admitting: Family Medicine

## 2022-10-28 ENCOUNTER — Other Ambulatory Visit: Payer: Self-pay | Admitting: Family Medicine

## 2022-11-16 DIAGNOSIS — H524 Presbyopia: Secondary | ICD-10-CM | POA: Diagnosis not present

## 2022-11-16 DIAGNOSIS — Z961 Presence of intraocular lens: Secondary | ICD-10-CM | POA: Diagnosis not present

## 2022-11-16 DIAGNOSIS — H52223 Regular astigmatism, bilateral: Secondary | ICD-10-CM | POA: Diagnosis not present

## 2022-11-16 DIAGNOSIS — H47013 Ischemic optic neuropathy, bilateral: Secondary | ICD-10-CM | POA: Diagnosis not present

## 2022-11-16 DIAGNOSIS — H35373 Puckering of macula, bilateral: Secondary | ICD-10-CM | POA: Diagnosis not present

## 2022-11-17 ENCOUNTER — Ambulatory Visit: Payer: Medicare PPO | Admitting: Family Medicine

## 2022-11-17 ENCOUNTER — Ambulatory Visit (INDEPENDENT_AMBULATORY_CARE_PROVIDER_SITE_OTHER)
Admission: RE | Admit: 2022-11-17 | Discharge: 2022-11-17 | Disposition: A | Discharge status: Home / Self Care | Payer: Medicare PPO | Source: Ambulatory Visit | Attending: Family Medicine | Admitting: Family Medicine

## 2022-11-17 ENCOUNTER — Ambulatory Visit: Payer: Medicare PPO | Attending: Internal Medicine | Admitting: Internal Medicine

## 2022-11-17 ENCOUNTER — Encounter: Payer: Self-pay | Admitting: Internal Medicine

## 2022-11-17 ENCOUNTER — Encounter: Payer: Self-pay | Admitting: Family Medicine

## 2022-11-17 VITALS — BP 90/62 | HR 60 | Ht 68.0 in | Wt 170.2 lb

## 2022-11-17 VITALS — BP 112/72 | HR 79 | Temp 98.5°F | Ht 68.0 in | Wt 169.8 lb

## 2022-11-17 DIAGNOSIS — I2510 Atherosclerotic heart disease of native coronary artery without angina pectoris: Secondary | ICD-10-CM | POA: Diagnosis not present

## 2022-11-17 DIAGNOSIS — M79604 Pain in right leg: Secondary | ICD-10-CM

## 2022-11-17 DIAGNOSIS — R300 Dysuria: Secondary | ICD-10-CM | POA: Diagnosis not present

## 2022-11-17 DIAGNOSIS — I10 Essential (primary) hypertension: Secondary | ICD-10-CM

## 2022-11-17 DIAGNOSIS — I495 Sick sinus syndrome: Secondary | ICD-10-CM | POA: Diagnosis not present

## 2022-11-17 DIAGNOSIS — R29898 Other symptoms and signs involving the musculoskeletal system: Secondary | ICD-10-CM

## 2022-11-17 DIAGNOSIS — R739 Hyperglycemia, unspecified: Secondary | ICD-10-CM | POA: Diagnosis not present

## 2022-11-17 DIAGNOSIS — Z950 Presence of cardiac pacemaker: Secondary | ICD-10-CM

## 2022-11-17 DIAGNOSIS — R7303 Prediabetes: Secondary | ICD-10-CM

## 2022-11-17 DIAGNOSIS — M79605 Pain in left leg: Secondary | ICD-10-CM

## 2022-11-17 DIAGNOSIS — M5136 Other intervertebral disc degeneration, lumbar region: Secondary | ICD-10-CM | POA: Diagnosis not present

## 2022-11-17 LAB — COMPREHENSIVE METABOLIC PANEL
ALT: 17 U/L (ref 0–53)
AST: 18 U/L (ref 0–37)
Albumin: 4.5 g/dL (ref 3.5–5.2)
Alkaline Phosphatase: 75 U/L (ref 39–117)
BUN: 19 mg/dL (ref 6–23)
CO2: 28 mEq/L (ref 19–32)
Calcium: 9.9 mg/dL (ref 8.4–10.5)
Chloride: 103 mEq/L (ref 96–112)
Creatinine, Ser: 1.26 mg/dL (ref 0.40–1.50)
GFR: 55.57 mL/min — ABNORMAL LOW (ref >60.00)
Glucose, Bld: 141 mg/dL — ABNORMAL HIGH (ref 70–99)
Potassium: 4.9 mEq/L (ref 3.5–5.1)
Sodium: 139 mEq/L (ref 135–145)
Total Bilirubin: 0.8 mg/dL (ref 0.2–1.2)
Total Protein: 7 g/dL (ref 6.0–8.3)

## 2022-11-17 LAB — CUP PACEART INCLINIC DEVICE CHECK
Battery Remaining Longevity: 17 mo
Brady Statistic RA Percent Paced: 97 %
Brady Statistic RV Percent Paced: 26 %
Date Time Interrogation Session: 20231219143749
Implantable Lead Connection Status: 753985
Implantable Lead Connection Status: 753985
Implantable Lead Implant Date: 20031017
Implantable Lead Implant Date: 20031017
Implantable Lead Location: 753859
Implantable Lead Location: 753860
Implantable Lead Model: 5076
Implantable Lead Model: 5076
Implantable Pulse Generator Implant Date: 20120210
Lead Channel Impedance Value: 370 Ohm
Lead Channel Impedance Value: 409 Ohm
Lead Channel Pacing Threshold Amplitude: 0.9 V
Lead Channel Pacing Threshold Amplitude: 1.3 V
Lead Channel Pacing Threshold Pulse Width: 0.4 ms
Lead Channel Pacing Threshold Pulse Width: 0.4 ms
Lead Channel Sensing Intrinsic Amplitude: 15.3 mV
Pulse Gen Serial Number: 66063261

## 2022-11-17 LAB — URINALYSIS, ROUTINE W REFLEX MICROSCOPIC
Bilirubin Urine: NEGATIVE
Ketones, ur: NEGATIVE
Nitrite: NEGATIVE
Specific Gravity, Urine: 1.02 (ref 1.000–1.030)
Urine Glucose: NEGATIVE
Urobilinogen, UA: 0.2 (ref 0.0–1.0)
pH: 6.5 (ref 5.0–8.0)

## 2022-11-17 LAB — CBC WITH DIFFERENTIAL/PLATELET
Basophils Absolute: 0.1 10*3/uL (ref 0.0–0.1)
Basophils Relative: 0.6 % (ref 0.0–3.0)
Eosinophils Absolute: 0.2 10*3/uL (ref 0.0–0.7)
Eosinophils Relative: 2.5 % (ref 0.0–5.0)
HCT: 43.3 % (ref 39.0–52.0)
Hemoglobin: 14.9 g/dL (ref 13.0–17.0)
Lymphocytes Relative: 27.4 % (ref 12.0–46.0)
Lymphs Abs: 2.6 10*3/uL (ref 0.7–4.0)
MCHC: 34.4 g/dL (ref 30.0–36.0)
MCV: 88.8 fl (ref 78.0–100.0)
Monocytes Absolute: 0.8 10*3/uL (ref 0.1–1.0)
Monocytes Relative: 8.2 % (ref 3.0–12.0)
Neutro Abs: 5.9 10*3/uL (ref 1.4–7.7)
Neutrophils Relative %: 61.3 % (ref 43.0–77.0)
Platelets: 292 10*3/uL (ref 150.0–400.0)
RBC: 4.88 Mil/uL (ref 4.22–5.81)
RDW: 13.5 % (ref 11.5–15.5)
WBC: 9.6 10*3/uL (ref 4.0–10.5)

## 2022-11-17 LAB — HEMOGLOBIN A1C: Hgb A1c MFr Bld: 6 % (ref 4.6–6.5)

## 2022-11-17 NOTE — Patient Instructions (Addendum)
Please go to [facility]  central X-ray (updated 01/25/2020) - located [address] across the street from [place] - in the basement - Hours: 8:30-5:00 PM M-F (with lunch from 12:30- 1 PM). You do NOT need an appointment.    Please stop by lab before you go If you have mychart- we will send your results within 3 business days of [place] receiving them.  If you do not have mychart- we will call you about results within 5 business days of [place] receiving them.  *please also note that you will see labs on mychart as soon as they post. I will later go in and write notes on them- will say "notes from Dr. Yong Channel"   We will call you within two weeks about your referral for ultrasound of blood flow in legs. If you do not hear within 2 weeks, give [place] a call.   If you are still having symptoms at end of initial workup as above without clear cause lets refer to sports medicine for their opinion.   Recommended follow up: Return for as needed for new, worsening, persistent symptoms.

## 2022-11-17 NOTE — Progress Notes (Signed)
HPI Mr. Elsen returns today for followup. He is a pleasant 76 yo man with sinus node dysfunction, s/p PPM insertion. He has non-obstructive CAD. He has dyslipidemia and is on statin therapy. In the interim, he notes he feels well and has gone back to swimming. He denies palpitations. He notes that his HR gets into the 120 range when he swims.  He has occaisional palpitations.  No Known Allergies   Current Outpatient Medications  Medication Sig Dispense Refill   acetic acid-hydrocortisone (VOSOL-HC) OTIC solution Place 4 drops into the right ear 2 (two) times daily. Apply to affected ear. For ear canal irritation/otitis externa. For 10 days 10 mL 0   benazepril (LOTENSIN) 5 MG tablet Take 1 tablet (5 mg total) by mouth daily. 90 tablet 3   cyclobenzaprine (FLEXERIL) 5 MG tablet Take 1 tablet (5 mg total) by mouth 3 (three) times daily as needed for muscle spasms. 30 tablet 2   finasteride (PROSCAR) 5 MG tablet Take 1 tablet by mouth once daily 90 tablet 0   gemfibrozil (LOPID) 600 MG tablet TAKE 1 TABLET BY MOUTH TWICE DAILY BEFORE  A  MEAL 180 tablet 3   icosapent Ethyl (VASCEPA) 1 g capsule Take 1 g by mouth 2 (two) times daily.     indomethacin (INDOCIN) 25 MG capsule Take 1 capsule (25 mg total) by mouth 2 (two) times daily as needed (pain). Take with a meal. 3 days maximun 30 capsule 0   metoprolol succinate (TOPROL-XL) 25 MG 24 hr tablet Take 1 tablet (25 mg total) by mouth daily. 90 tablet 3   Multiple Vitamin (MULTIVITAMIN) tablet Take 1 tablet by mouth daily.     omeprazole (PRILOSEC) 20 MG capsule Take 1 capsule by mouth once daily 90 capsule 0   rosuvastatin (CRESTOR) 40 MG tablet Take 1 tablet by mouth once daily 90 tablet 0   tadalafil (CIALIS) 5 MG tablet TAKE ONE TABLET BY MOUTH DAILY AS NEEDED FOR ERECTILE DYSFUNCTION 90 tablet 3   tamsulosin (FLOMAX) 0.4 MG CAPS capsule as needed.     XARELTO 20 MG TABS tablet TAKE 1 TABLET BY MOUTH ONCE DAILY WITH SUPPER 90 tablet 2    zolpidem (AMBIEN) 5 MG tablet TAKE 1 TABLET BY MOUTH AT BEDTIME 90 tablet 0   Colchicine 0.6 MG CAPS Day 1: Take 2 caps, then 1 cap an hour later. Day 2 and beyond: 1 cap daily (Patient not taking: Reported on 11/17/2022) 10 capsule 0   No current facility-administered medications for this visit.     Past Medical History:  Diagnosis Date   Abscess    right posterior neck   Anticoagulation adequate 06/07/2019   Overview:  SSS, paroxAFib/Flutter, CHADSvasc= 3 age, h/o cva; chronic [place] Xarelto   Atrial fibrillation and flutter ([place]) 01/01/2015   Noted 2016. Xarelto    BPH associated with nocturia 08/14/2014   Finasteride, flomax.  0-3x nocturia. PSA to 14-->eval urology [facility]. PSA trended back down to about 2.6 per records in 2015 then placed on finasteride- had significant voiding issues when spiked to 14- improved on meds    Bradycardia    CAD (coronary artery disease) 05/26/2016   Coronary CTA_ 50-75% D1 off CT report    Cardiac pacemaker in situ 12/28/2009   SA node dysfunction as cause    Chest pressure 12/01/2015   Overview:  atypical CP, [facility] nuclear perfusion imaging w/ no inarct, no ischemia, EF-40% NICM   Chronic systolic heart failure ([place])  12/01/2015   Overview:  [facility] nuclear perfusion imaging study, no inarct, no ischemia, EF-40% NICM   CVA (cerebral vascular accident) ([place])    Double vision 07/04/2018   Elevated LFTs    Erectile dysfunction 01/30/2015   cialis prn    External hemorrhoids    Fitting or adjustment of cardiac pacemaker 06/07/2019   Overview:  [place] [id number], RA and RV leads [id number]:  [id number] and [id number]   Former smoker 01/30/2015   Former smoker. 20 pack years quit 92. Patient declined AAA screen.     GERD (gastroesophageal reflux disease)    Gout 11/11/2009   Qualifier: Diagnosis of  By: Lynden Ang     History of nuclear stress test 06/07/2019   Overview:  [facility], no inarct, no ischemia, EF-40% NICM   HTN (hypertension)    Hyperglycemia 06/10/2017    110s CBG 2018   Hyperlipidemia 07/12/2007   Crestor '20mg'$  daily with some myalgias; fish oil. Trig 200-500 despite this. LDL ok.        Insomnia 01/30/2015   ambien '5mg'$  prn. May refill.     Ischemic optic neuropathy    Left chest pressure 03/19/2015   Other and unspecified hyperlipidemia    Sinoatrial node dysfunction (HCC)    Spinal stenosis    Trigger finger, right middle finger 03/01/2018    ROS:   All systems reviewed and negative except as noted in the HPI.   Past Surgical History:  Procedure Laterality Date   ABSCESS DRAINAGE     righ tposterior neck   CARDIAC CATHETERIZATION  1996   CHOLECYSTECTOMY  02/07/2003   COLONOSCOPY  06/23/2004   EYE SURGERY     left eye   left ingunial hernia  06/08/2011   PACEMAKER INSERTION     right hip replacement     2012 Dr. Maureen Ralphs     Family History  Problem Relation Age of Onset   Lung cancer Mother        mets to brain   Heart attack Mother 64   Hypertension Mother    Brain cancer Mother    Heart attack Father        mid 85s, smoker.    Diabetes Father    Cancer Brother    Diabetes Paternal Grandfather    Healthy Brother    Colon cancer Neg Hx    Esophageal cancer Neg Hx    Stomach cancer Neg Hx    Rectal cancer Neg Hx      Social History   Socioeconomic History   Marital status: Married    Spouse name: Not on file   Number of children: 1   Years of education: 20   Highest education level: Not on file  Occupational History   Not on file  Tobacco Use   Smoking status: Former    Packs/day: 1.00    Years: 20.00    Total pack years: 20.00    Types: Cigarettes    Quit date: 11/04/1991    Years since quitting: 31.0   Smokeless tobacco: Never  Vaping Use   Vaping Use: Never used  Substance and Sexual Activity   Alcohol use: Yes    Alcohol/week: 0.0 standard drinks of alcohol    Comment: occ   Drug use: No   Sexual activity: Not on file  Other Topics Concern   Not on file  Social History Narrative    Married. 1 child and 1 step child. 2 grandchildren.  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      [place]: swimming (states not fun), travel      Lives in a single story home   Social Determinants of Health   Financial Resource Strain: Low Risk  (02/19/2022)   Overall Financial Resource Strain (CARDIA)    Difficulty of Paying Living Expenses: Not hard at all  Food Insecurity: No Food Insecurity (02/19/2022)   Hunger Vital Sign    Worried About Running Out of Food in the Last Year: Never true    [place] in the Last Year: Never true  Transportation Needs: No Transportation Needs (02/19/2022)   PRAPARE - Hydrologist (Medical): No    Lack of Transportation (Non-Medical): No  Physical Activity: Sufficiently Active (02/19/2022)   Exercise Vital Sign    Days of Exercise per Week: 4 days    Minutes of Exercise per Session: 60 min  Stress: No Stress Concern Present (02/19/2022)   [place]    Feeling of Stress : Not at all  Social Connections: Moderately Isolated (02/19/2022)   Social Connection and Isolation Panel [NHANES]    Frequency of Communication with Friends and Family: More than three times a week    Frequency of Social Gatherings with Friends and Family: Twice a week    Attends Religious Services: Never    Marine scientist or Organizations: No    Attends Archivist Meetings: Never    Marital Status: Married  Human resources officer Violence: Not At Risk (02/19/2022)   Humiliation, Afraid, Rape, and Kick questionnaire    Fear of Current or Ex-Partner: No    Emotionally Abused: No    Physically Abused: No    Sexually Abused: No     BP 90/62   Pulse 60   Ht '5\' 8"'$  (1.727 m)   Wt 170 lb 3.2 oz (77.2 kg)   SpO2 95%   BMI 25.88 kg/m   Physical Exam:  Well appearing NAD HEENT: Unremarkable Neck:  No JVD, no thyromegally Lymphatics:  No adenopathy Back:  No CVA  tenderness Lungs:  Clear HEART:  Regular rate rhythm, no murmurs, no rubs, no clicks Abd:  soft, positive bowel sounds, no organomegally, no rebound, no guarding Ext:  2 plus pulses, no edema, no cyanosis, no clubbing Skin:  No rashes no nodules Neuro:  CN II through XII intact, motor grossly intact  EKG - nsr with atrial pacing  DEVICE  Normal device function.  See PaceArt for details.   Assess/Plan: 1.  Sinus node dysfunction -he has no underlying escape rhythm.  He is stable status post pacemaker insertion. He appears to have normal chronotropic responsed to exercise. I stongly suspect that the reduction in his pulse rate mentioned above was due to bigeminal PVC's. He averages 1000 a day. 2.  Pacemaker -his Biotronik dual-chamber pacemaker is working normally.  His histograms look good.  He will be rechecked in several months. 3.  Coronary artery disease -he denies anginal symptoms.  He has nonobstructive disease except for some branch vessel disease for which she is asymptomatic despite exercising vigorously.  He will undergo watchful waiting.  No change in his medications. 4.  Dyslipidemia -he has a preponderance of hypertriglyceridemia.  He is on multiple medications which he will continue.     Carleene Overlie ,MD

## 2022-11-17 NOTE — Patient Instructions (Addendum)
Medication Instructions:  Your physician recommends that you continue on your current medications as directed. Please refer to the Current Medication list given to you today.  *If you need a refill on your cardiac medications before your next appointment, please call your pharmacy*  Lab Work: None ordered.  If you have labs (blood work) drawn today and your tests are completely normal, you will receive your results only by: [facility] (if you have MyChart) OR A paper copy in the mail If you have any lab test that is abnormal or we need to change your treatment, we will call you to review the results.  Testing/Procedures: None ordered.  Follow-Up: At [facility], you and your health needs are our priority.  As part of our continuing mission to provide you with exceptional heart care, we have created designated Provider Care Teams.  These Care Teams include your primary Cardiologist (physician) and Advanced Practice Providers (APPs -  Physician Assistants and Nurse Practitioners) who all work together to provide you with the care you need, when you need it.  We recommend signing up for the patient portal called "MyChart".  Sign up information is provided on this After Visit Summary.  MyChart is used to connect with patients for Virtual Visits (Telemedicine).  Patients are able to view lab/test results, encounter notes, upcoming appointments, etc.  Non-urgent messages can be sent to your provider as well.   To learn more about what you can do with MyChart, go to [email or web address].    Your next appointment:   Please schedule a 6 month Pacemaker check appointment with device clinic.    The format for your next appointment:   In Person  Provider:   Cristopher Peru, MD{or one of the following Advanced Practice Providers on your designated Care Team:   Tommye Standard, [place] Legrand Como "Jonni Sanger" Chalmers Cater, [place]   Important Information About Sugar

## 2022-11-17 NOTE — Progress Notes (Signed)
Phone [phone] In person visit   Subjective:   [name] is a 76 y.o. year old very pleasant male patient who presents for/with See problem oriented charting Chief Complaint  Patient presents with   Extremity Weakness    Pt c/o leg weakness and discomfort in bilateral legs that started a month ago that is staying about the same.   Past Medical History-  Patient Active Problem List   Diagnosis Date Noted   Paroxysmal atrial fibrillation ([place]) 07/27/2019    Priority: High   S/P cardiac cath 06/07/2019    Priority: High   CAD (coronary artery disease) 05/26/2016    Priority: High   Nonischemic cardiomyopathy ([place]) 04/23/2016    Priority: High   Chronic systolic heart failure ([place]) 12/01/2015    Priority: High   CVA (cerebral infarction) 01/30/2015    Priority: High   Atrial fibrillation and flutter ([place]) 01/01/2015    Priority: High   Ventricular tachycardia, non-sustained ([place]) 01/25/2013    Priority: High   Cardiac pacemaker in situ 12/28/2009    Priority: High   Aortic atherosclerosis ([place]) 08/01/2020    Priority: Medium    Fatty liver 08/01/2020    Priority: Medium    Double vision 07/04/2018    Priority: Medium    Hyperglycemia 06/10/2017    Priority: Medium    Insomnia 01/30/2015    Priority: Medium    BPH associated with nocturia 08/14/2014    Priority: Medium    Gout 11/11/2009    Priority: Medium    Hyperlipidemia 07/12/2007    Priority: Medium    NEUROPATHY, ISCHEMIC OPTIC 06/02/2007    Priority: Medium    Essential hypertension 06/02/2007    Priority: Medium    Anticoagulation adequate 06/07/2019    Priority: Low   Convergence insufficiency 06/07/2019    Priority: Low   Fitting or adjustment of cardiac pacemaker 06/07/2019    Priority: Low   Pacemaker-dependent due to native cardiac rhythm insufficient to support life 06/07/2019    Priority: Low   Palpitations 06/07/2019    Priority: Low   History of nuclear stress test 06/07/2019     Priority: Low   Non-occlusive coronary artery disease 06/07/2019    Priority: Low   Trigger finger, right middle finger 03/01/2018    Priority: Low   Solitary pulmonary nodule 05/26/2016    Priority: Low   Chest pressure 12/01/2015    Priority: Low   Left chest pressure 03/19/2015    Priority: Low   Erectile dysfunction 01/30/2015    Priority: Low   Former smoker 01/30/2015    Priority: Low   Allergic rhinitis 10/11/2014    Priority: Low   Sinoatrial node dysfunction ([place])     Priority: Low   SPINAL STENOSIS, LUMBAR 08/22/2010    Priority: Low   GERD 03/17/2010    Priority: Low    Medications- reviewed and updated Current Outpatient Medications  Medication Sig Dispense Refill   acetic acid-hydrocortisone (VOSOL-HC) OTIC solution Place 4 drops into the right ear 2 (two) times daily. Apply to affected ear. For ear canal irritation/otitis externa. For 10 days 10 mL 0   benazepril (LOTENSIN) 5 MG tablet Take 1 tablet (5 mg total) by mouth daily. 90 tablet 3   Colchicine 0.6 MG CAPS Day 1: Take 2 caps, then 1 cap an hour later. Day 2 and beyond: 1 cap daily 10 capsule 0   cyclobenzaprine (FLEXERIL) 5 MG tablet Take 1 tablet (5 mg total) by mouth 3 (three)  times daily as needed for muscle spasms. 30 tablet 2   finasteride (PROSCAR) 5 MG tablet Take 1 tablet by mouth once daily 90 tablet 0   gemfibrozil (LOPID) 600 MG tablet TAKE 1 TABLET BY MOUTH TWICE DAILY BEFORE  A  MEAL 180 tablet 3   icosapent Ethyl (VASCEPA) 1 g capsule Take 1 g by mouth 2 (two) times daily.     indomethacin (INDOCIN) 25 MG capsule Take 1 capsule (25 mg total) by mouth 2 (two) times daily as needed (pain). Take with a meal. 3 days maximun 30 capsule 0   metoprolol succinate (TOPROL-XL) 25 MG 24 hr tablet Take 1 tablet (25 mg total) by mouth daily. 90 tablet 3   Multiple Vitamin (MULTIVITAMIN) tablet Take 1 tablet by mouth daily.     omeprazole (PRILOSEC) 20 MG capsule Take 1 capsule by mouth once daily 90  capsule 0   rosuvastatin (CRESTOR) 40 MG tablet Take 1 tablet by mouth once daily 90 tablet 0   tadalafil (CIALIS) 5 MG tablet TAKE ONE TABLET BY MOUTH DAILY AS NEEDED FOR ERECTILE DYSFUNCTION 90 tablet 3   XARELTO 20 MG TABS tablet TAKE 1 TABLET BY MOUTH ONCE DAILY WITH SUPPER 90 tablet 2   zolpidem (AMBIEN) 5 MG tablet TAKE 1 TABLET BY MOUTH AT BEDTIME 90 tablet 0   No current facility-administered medications for this visit.     Objective:  BP 112/72   Pulse 79   Temp 98.5 F (36.9 C)   Ht '5\' 8"'$  (1.727 m)   Wt 169 lb 12.8 oz (77 kg)   SpO2 99%   BMI 25.82 kg/m  Gen: NAD, resting comfortably CV: RRR no murmurs rubs or gallops Lungs: CTAB no crackles, wheeze, rhonchi Abdomen: soft/nontender/nondistended/normal bowel sounds. No rebound or guarding.  Ext: no edema Skin: warm, dry Rectal: normal tone, diffusely enlarged prostate, no masses or tenderness Back - Normal skin, Spine with normal alignment and no deformity.  No tenderness to vertebral process palpation.  Paraspinous muscles are not tender and without spasm.   Range of motion is limited at lumbar spine but patient reports this is his baseline-unable to touch toes well over a foot . Negative Straight leg raise.  Neuro- no saddle anesthesia, 5/5 strength lower extremities but rather stiff     Assessment and Plan   # Bilateral leg weakness and discomfort (he would not describe as pain) S:Symptoms started about a month ago.  Denies low back pain- only buttocks discomfort  -if laying down on couch notes a fatigued sensation from buttcks down into thighs and some laterally into the legs- occasionally in the back of the leg.  -also notes it with going to bathroom to urinate while standing up- less in the buttocks at that time.  - does feel weakness when first starts walking but gets better with walking -does not notice in thighs if sitting down  -no upper body weakness sensation -not sure if better with leaning over- but does  lean over slightly -also about a month ago noticed an itch in penis- had some yeast infections in the past with yeast infections when he was young- but none recently. Mild burning. No discharge. Only sexually active with wife- she has no symptoms -harder to start stream, stream weaker at times than usual. Not urinating at night. Had some old flomax and tried -trip January 18 to February 23rd - has not used flexeril or indomethacin with this -numbness/tingling worse in feet and slightly worse onto top  of feet now.  A/P: Bilateral leg weakness/discomfort for the last month-week Apparent on exam and unable to trigger sensation other than the sat up from lying down - Has some urinary symptoms and we will get urinalysis and culture.  Also get PSA but was trending down on last check.  Does have some BPH despite finasteride.  Nontender nonboggy prostate doubt prostatitis contributing.  History of yeast infections years ago but hold off on testing for that unless persistent or worsening issues.  Only active with wife so did not test for gonorrhea or chlamydia - With extensive vascular history we opted to do ABIs though actually can improve with walking so suspect this is less likely - Will get lumbar spine x-rays - Strongly considering sports medicine consult-may not even wait on the ABIs - Worsening neuropathy and wondering if he may need nerve conduction studies or MRI of lumbar spine or even neurology consult-had seen Dr. Posey Pronto in the past with well over 5 years of issues    # Hyperglycemia/insulin resistance/prediabetes-peak A1c 5.9 S:  Medication: None Lab Results  Component Value Date   HGBA1C 6.1 03/09/2022   HGBA1C 5.6 09/16/2021   HGBA1C 5.8 (H) 08/01/2020  A/P: Hopefully stable or improved- update A1c with labs today. Continue without meds for now.   #hypertension S: medication: Metoprolol 25 mg extended release, benazepril 5 mg  BP Readings from Last 3 Encounters:  11/17/22 112/72   05/19/22 124/72  04/06/22 125/83   A/P: stable- continue current medicines    Recommended follow up: Return for as needed for new, worsening, persistent symptoms. Future Appointments  Date Time Provider [place]  11/17/2022  2:00 PM Evans Lance, MD [facility]  03/08/2023  9:00 AM Marin Olp, MD [facility]    Lab/Order associations:   ICD-10-CM   1. Bilateral leg weakness  R29.898 DG Lumbar Spine Complete    VAS [place] ABI WITH/WO TBI    2. Dysuria  R30.0 Urine Culture    Urinalysis, Routine w reflex microscopic    3. Bilateral leg pain  M79.604 DG Lumbar Spine Complete   M79.605 VAS [place] ABI WITH/WO TBI    4. Essential hypertension  I10 CBC with Differential/Platelet    Comprehensive metabolic panel    5. Hyperglycemia  R73.9 HgB A1c    6. Prediabetes  R73.03       No orders of the defined types were placed in this encounter.   Return precautions advised.  Garret Reddish, MD

## 2022-11-19 LAB — URINE CULTURE
MICRO NUMBER:: 14334637
Result:: NO GROWTH
SPECIMEN QUALITY:: ADEQUATE

## 2022-11-24 ENCOUNTER — Other Ambulatory Visit: Payer: Self-pay

## 2022-11-24 DIAGNOSIS — R3129 Other microscopic hematuria: Secondary | ICD-10-CM

## 2022-11-27 ENCOUNTER — Encounter (HOSPITAL_COMMUNITY): Payer: Medicare PPO

## 2022-12-01 ENCOUNTER — Encounter: Payer: Self-pay | Admitting: Family Medicine

## 2022-12-01 ENCOUNTER — Ambulatory Visit: Payer: Medicare PPO | Admitting: Family Medicine

## 2022-12-01 VITALS — BP 110/62 | HR 71 | Temp 97.3°F | Ht 68.0 in | Wt 171.0 lb

## 2022-12-01 DIAGNOSIS — J029 Acute pharyngitis, unspecified: Secondary | ICD-10-CM

## 2022-12-01 DIAGNOSIS — J020 Streptococcal pharyngitis: Secondary | ICD-10-CM | POA: Diagnosis not present

## 2022-12-01 DIAGNOSIS — I10 Essential (primary) hypertension: Secondary | ICD-10-CM | POA: Diagnosis not present

## 2022-12-01 LAB — POCT INFLUENZA A/B
Influenza A, POC: NEGATIVE
Influenza B, POC: NEGATIVE

## 2022-12-01 LAB — POCT RAPID STREP A (OFFICE): Rapid Strep A Screen: POSITIVE — AB

## 2022-12-01 LAB — POC COVID19 BINAXNOW: SARS Coronavirus 2 Ag: NEGATIVE

## 2022-12-01 MED ORDER — AMOXICILLIN 500 MG PO CAPS: 500.0000 mg | ORAL_CAPSULE | Freq: Two times a day (BID) | ORAL | 0 refills | Status: AC

## 2022-12-01 NOTE — Patient Instructions (Addendum)
Strep test is positive  - treat with amoxicillin- follow up if fail to improve or if symptoms worsen  Recommended follow up: Return for followup or sooner if needed.Schedule b4 you leave.

## 2022-12-01 NOTE — Progress Notes (Signed)
Phone [phone] In person visit   Subjective:   [name] is a 77 y.o. year old very pleasant male patient who presents for/with See problem oriented charting Chief Complaint  Patient presents with   right ear block    Pt c/o right ear pressure along with sore throat and cough that started last Wednesday.   Past Medical History-  Patient Active Problem List   Diagnosis Date Noted   Paroxysmal atrial fibrillation ([place]) 07/27/2019    Priority: High   S/P cardiac cath 06/07/2019    Priority: High   CAD (coronary artery disease) 05/26/2016    Priority: High   Nonischemic cardiomyopathy ([place]) 04/23/2016    Priority: High   Chronic systolic heart failure ([place]) 12/01/2015    Priority: High   CVA (cerebral infarction) 01/30/2015    Priority: High   Atrial fibrillation and flutter ([place]) 01/01/2015    Priority: High   Ventricular tachycardia, non-sustained ([place]) 01/25/2013    Priority: High   Cardiac pacemaker in situ 12/28/2009    Priority: High   Aortic atherosclerosis ([place]) 08/01/2020    Priority: Medium    Fatty liver 08/01/2020    Priority: Medium    Double vision 07/04/2018    Priority: Medium    Hyperglycemia 06/10/2017    Priority: Medium    Insomnia 01/30/2015    Priority: Medium    BPH associated with nocturia 08/14/2014    Priority: Medium    Gout 11/11/2009    Priority: Medium    Hyperlipidemia 07/12/2007    Priority: Medium    NEUROPATHY, ISCHEMIC OPTIC 06/02/2007    Priority: Medium    Essential hypertension 06/02/2007    Priority: Medium    Anticoagulation adequate 06/07/2019    Priority: Low   Convergence insufficiency 06/07/2019    Priority: Low   Fitting or adjustment of cardiac pacemaker 06/07/2019    Priority: Low   Pacemaker-dependent due to native cardiac rhythm insufficient to support life 06/07/2019    Priority: Low   Palpitations 06/07/2019    Priority: Low   History of nuclear stress test 06/07/2019    Priority: Low    Non-occlusive coronary artery disease 06/07/2019    Priority: Low   Trigger finger, right middle finger 03/01/2018    Priority: Low   Solitary pulmonary nodule 05/26/2016    Priority: Low   Chest pressure 12/01/2015    Priority: Low   Left chest pressure 03/19/2015    Priority: Low   Erectile dysfunction 01/30/2015    Priority: Low   Former smoker 01/30/2015    Priority: Low   Allergic rhinitis 10/11/2014    Priority: Low   Sinoatrial node dysfunction ([place])     Priority: Low   SPINAL STENOSIS, LUMBAR 08/22/2010    Priority: Low   GERD 03/17/2010    Priority: Low    Medications- reviewed and updated Current Outpatient Medications  Medication Sig Dispense Refill   acetic acid-hydrocortisone (VOSOL-HC) OTIC solution Place 4 drops into the right ear 2 (two) times daily. Apply to affected ear. For ear canal irritation/otitis externa. For 10 days 10 mL 0   amoxicillin (AMOXIL) 500 MG capsule Take 1 capsule (500 mg total) by mouth 2 (two) times daily for 10 days. 20 capsule 0   benazepril (LOTENSIN) 5 MG tablet Take 1 tablet (5 mg total) by mouth daily. 90 tablet 3   Colchicine 0.6 MG CAPS Day 1: Take 2 caps, then 1 cap an hour later. Day 2 and beyond: 1 cap  daily 10 capsule 0   cyclobenzaprine (FLEXERIL) 5 MG tablet Take 1 tablet (5 mg total) by mouth 3 (three) times daily as needed for muscle spasms. 30 tablet 2   finasteride (PROSCAR) 5 MG tablet Take 1 tablet by mouth once daily 90 tablet 0   gemfibrozil (LOPID) 600 MG tablet TAKE 1 TABLET BY MOUTH TWICE DAILY BEFORE  A  MEAL 180 tablet 3   icosapent Ethyl (VASCEPA) 1 g capsule Take 1 g by mouth 2 (two) times daily.     indomethacin (INDOCIN) 25 MG capsule Take 1 capsule (25 mg total) by mouth 2 (two) times daily as needed (pain). Take with a meal. 3 days maximun 30 capsule 0   metoprolol succinate (TOPROL-XL) 25 MG 24 hr tablet Take 1 tablet (25 mg total) by mouth daily. 90 tablet 3   Multiple Vitamin (MULTIVITAMIN) tablet Take 1  tablet by mouth daily.     omeprazole (PRILOSEC) 20 MG capsule Take 1 capsule by mouth once daily 90 capsule 0   rosuvastatin (CRESTOR) 40 MG tablet Take 1 tablet by mouth once daily 90 tablet 0   tadalafil (CIALIS) 5 MG tablet TAKE ONE TABLET BY MOUTH DAILY AS NEEDED FOR ERECTILE DYSFUNCTION 90 tablet 3   tamsulosin (FLOMAX) 0.4 MG CAPS capsule as needed.     XARELTO 20 MG TABS tablet TAKE 1 TABLET BY MOUTH ONCE DAILY WITH SUPPER 90 tablet 2   zolpidem (AMBIEN) 5 MG tablet TAKE 1 TABLET BY MOUTH AT BEDTIME 90 tablet 0   No current facility-administered medications for this visit.     Objective:  BP 110/62   Pulse 71   Temp (!) 97.3 F (36.3 C)   Ht '5\' 8"'$  (1.727 m)   Wt 171 lb (77.6 kg)   SpO2 97%   BMI 26.00 kg/m  Gen: NAD, resting comfortably Pharynx erythematous-tonsils erythematous and swollen right greater than left, tympanic membrane normal on the left and only mildly erythematous on the right, nasal turbinates mildly erythematous and edematous CV: Regular heart rate Lungs: CTAB no crackles, wheeze, rhonchi Ext: no edema Skin: warm, dry  Results for orders placed or performed in visit on 12/01/22 (from the past 24 hour(s))  POCT rapid strep A     Status: Abnormal   Collection Time: 12/01/22  2:40 PM  Result Value Ref Range   Rapid Strep A Screen Positive (A) Negative  POCT Influenza A/B     Status: None   Collection Time: 12/01/22  2:40 PM  Result Value Ref Range   Influenza A, POC Negative Negative   Influenza B, POC Negative Negative  POC COVID-19     Status: None   Collection Time: 12/01/22  2:40 PM  Result Value Ref Range   SARS Coronavirus 2 Ag Negative Negative       Assessment and Plan   # Sore throat/cough/congestion on the right ear  S: Patient reports symptoms started on 27 December-day 7 of symptoms today. Had been spending time with family (sick grandkids) and flew to [place].  Cough has improved significantly as well -has tried OTC meds for  cold and seems to have improved nasal congestion and ear is better- he is not sure if has decongestant in it (would avoid decongestant) - No fever, chills, shortness of breath, chest pain, nausea or vomiting -did have diarrhea one day  A/P: Strep test is positive (also wonder if he has had a mild viral URI as well)- treat strep though with amoxicillin- follow  up if fail to improve or if symptoms worsen -Mild erythema on right tympanic membrane-has some fullness but no pain-considered advancing to Augmentin but ultimately we decided to treat strep throat and if not improving or ear pain worsens transitioning to Augmentin -COVID and flu test negative  #hypertension S: medication: Metoprolol 25 mg extended release, benazepril 5 mg  BP Readings from Last 3 Encounters:  12/01/22 110/62  11/17/22 90/62  11/17/22 112/72   A/P: Controlled. Continue current medications. -Encouraged him to avoid decongestants   # Bilateral leg weakness-he reports being away from his home was actually helpful-he wonders if the way he lays on his couch contributes - Did have some microscopic hematuria as well as leukocytes in urine despite reassuring culture-pending urology consult - Pending ABIs tomorrow - Lumbar x-rays with some degenerative changes-Consider prednisone but with A1c and improving symptoms we opted to hold off for now and consider sports medicine consult if no cause found on ABIs  Recommended follow up: Return for followup or sooner if needed.Schedule b4 you leave. Future Appointments  Date Time Provider [place]  12/02/2022  2:30 PM [facility] 3 [facility]  03/08/2023  9:00 AM Marin Olp, MD [facility]    Lab/Order associations:   ICD-10-CM   1. Strep throat  J02.0     2. Essential hypertension  I10     3. Sore throat  J02.9 POC COVID-19    POCT rapid strep A    POCT Influenza A/B      Meds ordered this encounter  Medications   amoxicillin (AMOXIL) 500 MG capsule     Sig: Take 1 capsule (500 mg total) by mouth 2 (two) times daily for 10 days.    Dispense:  20 capsule    Refill:  0    Return precautions advised.  Garret Reddish, MD

## 2022-12-02 ENCOUNTER — Ambulatory Visit (HOSPITAL_COMMUNITY)
Admission: RE | Admit: 2022-12-02 | Discharge: 2022-12-02 | Disposition: A | Discharge status: Home / Self Care | Payer: Medicare PPO | Source: Ambulatory Visit | Attending: Cardiovascular Disease | Admitting: Cardiovascular Disease

## 2022-12-02 DIAGNOSIS — M79605 Pain in left leg: Secondary | ICD-10-CM | POA: Diagnosis not present

## 2022-12-02 DIAGNOSIS — M79604 Pain in right leg: Secondary | ICD-10-CM | POA: Insufficient documentation

## 2022-12-02 DIAGNOSIS — R29898 Other symptoms and signs involving the musculoskeletal system: Secondary | ICD-10-CM | POA: Insufficient documentation

## 2022-12-03 ENCOUNTER — Encounter: Payer: Self-pay | Admitting: Family Medicine

## 2022-12-07 DIAGNOSIS — R3912 Poor urinary stream: Secondary | ICD-10-CM | POA: Diagnosis not present

## 2022-12-07 DIAGNOSIS — N401 Enlarged prostate with lower urinary tract symptoms: Secondary | ICD-10-CM | POA: Diagnosis not present

## 2022-12-07 DIAGNOSIS — R350 Frequency of micturition: Secondary | ICD-10-CM | POA: Diagnosis not present

## 2022-12-07 DIAGNOSIS — R300 Dysuria: Secondary | ICD-10-CM | POA: Diagnosis not present

## 2022-12-07 DIAGNOSIS — R3911 Hesitancy of micturition: Secondary | ICD-10-CM | POA: Diagnosis not present

## 2022-12-09 DIAGNOSIS — L820 Inflamed seborrheic keratosis: Secondary | ICD-10-CM | POA: Diagnosis not present

## 2022-12-09 DIAGNOSIS — D485 Neoplasm of uncertain behavior of skin: Secondary | ICD-10-CM | POA: Diagnosis not present

## 2022-12-09 DIAGNOSIS — L821 Other seborrheic keratosis: Secondary | ICD-10-CM | POA: Diagnosis not present

## 2022-12-09 DIAGNOSIS — D225 Melanocytic nevi of trunk: Secondary | ICD-10-CM | POA: Diagnosis not present

## 2022-12-09 DIAGNOSIS — L578 Other skin changes due to chronic exposure to nonionizing radiation: Secondary | ICD-10-CM | POA: Diagnosis not present

## 2022-12-09 DIAGNOSIS — C44622 Squamous cell carcinoma of skin of right upper limb, including shoulder: Secondary | ICD-10-CM | POA: Diagnosis not present

## 2022-12-09 DIAGNOSIS — L570 Actinic keratosis: Secondary | ICD-10-CM | POA: Diagnosis not present

## 2022-12-14 ENCOUNTER — Other Ambulatory Visit: Payer: Self-pay | Admitting: Family Medicine

## 2023-01-25 ENCOUNTER — Other Ambulatory Visit: Payer: Self-pay | Admitting: Family Medicine

## 2023-01-25 ENCOUNTER — Encounter: Payer: Self-pay | Admitting: Family Medicine

## 2023-01-26 ENCOUNTER — Other Ambulatory Visit: Payer: Self-pay

## 2023-01-26 DIAGNOSIS — M5430 Sciatica, unspecified side: Secondary | ICD-10-CM

## 2023-01-27 DIAGNOSIS — N401 Enlarged prostate with lower urinary tract symptoms: Secondary | ICD-10-CM | POA: Diagnosis not present

## 2023-01-27 DIAGNOSIS — D229 Melanocytic nevi, unspecified: Secondary | ICD-10-CM | POA: Diagnosis not present

## 2023-01-27 DIAGNOSIS — T148XXA Other injury of unspecified body region, initial encounter: Secondary | ICD-10-CM | POA: Diagnosis not present

## 2023-01-27 DIAGNOSIS — C44729 Squamous cell carcinoma of skin of left lower limb, including hip: Secondary | ICD-10-CM | POA: Diagnosis not present

## 2023-01-27 DIAGNOSIS — D239 Other benign neoplasm of skin, unspecified: Secondary | ICD-10-CM | POA: Diagnosis not present

## 2023-01-27 DIAGNOSIS — L570 Actinic keratosis: Secondary | ICD-10-CM | POA: Diagnosis not present

## 2023-01-27 DIAGNOSIS — B351 Tinea unguium: Secondary | ICD-10-CM | POA: Diagnosis not present

## 2023-01-27 DIAGNOSIS — D485 Neoplasm of uncertain behavior of skin: Secondary | ICD-10-CM | POA: Diagnosis not present

## 2023-01-27 DIAGNOSIS — B353 Tinea pedis: Secondary | ICD-10-CM | POA: Diagnosis not present

## 2023-01-27 NOTE — Progress Notes (Unsigned)
    [name] D.[facility] Phone: [phone]   Assessment and Plan:     There are no diagnoses linked to this encounter.  ***   Pertinent previous records reviewed include ***   Follow Up: ***     Subjective:   I,  , am serving as a Education administrator for Doctor Glennon Mac  Chief Complaint: low back pain   HPI:   01/28/2023 Patient is a 77 year old male complaining of low back pain. Patient states  Relevant Historical Information: ***  Additional pertinent review of systems negative.   Current Outpatient Medications:    acetic acid-hydrocortisone (VOSOL-HC) OTIC solution, Place 4 drops into the right ear 2 (two) times daily. Apply to affected ear. For ear canal irritation/otitis externa. For 10 days, Disp: 10 mL, Rfl: 0   benazepril (LOTENSIN) 5 MG tablet, Take 1 tablet (5 mg total) by mouth daily., Disp: 90 tablet, Rfl: 3   Colchicine 0.6 MG CAPS, Day 1: Take 2 caps, then 1 cap an hour later. Day 2 and beyond: 1 cap daily, Disp: 10 capsule, Rfl: 0   cyclobenzaprine (FLEXERIL) 5 MG tablet, Take 1 tablet (5 mg total) by mouth 3 (three) times daily as needed for muscle spasms., Disp: 30 tablet, Rfl: 2   finasteride (PROSCAR) 5 MG tablet, Take 1 tablet by mouth once daily, Disp: 90 tablet, Rfl: 0   gemfibrozil (LOPID) 600 MG tablet, TAKE 1 TABLET BY MOUTH TWICE DAILY BEFORE  A  MEAL, Disp: 180 tablet, Rfl: 3   icosapent Ethyl (VASCEPA) 1 g capsule, Take 1 g by mouth 2 (two) times daily., Disp: , Rfl:    indomethacin (INDOCIN) 25 MG capsule, TAKE 1 CAPSULE BY MOUTH TWICE DAILY WITH A MEAL AS NEEDED FOR PAIN. 3  DAYS  MAXIMUM, Disp: 30 capsule, Rfl: 0   metoprolol succinate (TOPROL-XL) 25 MG 24 hr tablet, Take 1 tablet (25 mg total) by mouth daily., Disp: 90 tablet, Rfl: 3   Multiple Vitamin (MULTIVITAMIN) tablet, Take 1 tablet by mouth daily., Disp: , Rfl:    omeprazole (PRILOSEC) 20 MG capsule,  Take 1 capsule by mouth once daily, Disp: 90 capsule, Rfl: 0   rosuvastatin (CRESTOR) 40 MG tablet, Take 1 tablet by mouth once daily, Disp: 90 tablet, Rfl: 0   tadalafil (CIALIS) 5 MG tablet, TAKE ONE TABLET BY MOUTH DAILY AS NEEDED FOR ERECTILE DYSFUNCTION, Disp: 90 tablet, Rfl: 3   tamsulosin (FLOMAX) 0.4 MG CAPS capsule, Take 1 capsule by mouth once daily, Disp: 90 capsule, Rfl: 0   XARELTO 20 MG TABS tablet, TAKE 1 TABLET BY MOUTH ONCE DAILY WITH SUPPER, Disp: 90 tablet, Rfl: 2   zolpidem (AMBIEN) 5 MG tablet, TAKE 1 TABLET BY MOUTH AT BEDTIME, Disp: 90 tablet, Rfl: 0   Objective:     There were no vitals filed for this visit.    There is no height or weight on file to calculate BMI.    Physical Exam:    ***   Electronically signed by:  [name] D.[facility] 12:07 PM 01/27/23

## 2023-01-28 ENCOUNTER — Ambulatory Visit: Payer: Medicare PPO | Admitting: Sports Medicine

## 2023-01-28 VITALS — BP 122/80 | HR 68 | Ht 68.0 in | Wt 178.0 lb

## 2023-01-28 DIAGNOSIS — M5441 Lumbago with sciatica, right side: Secondary | ICD-10-CM

## 2023-01-28 DIAGNOSIS — M5442 Lumbago with sciatica, left side: Secondary | ICD-10-CM | POA: Diagnosis not present

## 2023-01-28 DIAGNOSIS — M5136 Other intervertebral disc degeneration, lumbar region: Secondary | ICD-10-CM

## 2023-01-28 DIAGNOSIS — G8929 Other chronic pain: Secondary | ICD-10-CM | POA: Diagnosis not present

## 2023-01-28 DIAGNOSIS — M47816 Spondylosis without myelopathy or radiculopathy, lumbar region: Secondary | ICD-10-CM | POA: Diagnosis not present

## 2023-01-28 MED ORDER — METHYLPREDNISOLONE 4 MG PO TBPK: ORAL_TABLET | ORAL | 0 refills | Status: DC

## 2023-01-28 NOTE — Patient Instructions (Addendum)
Low back HEP  Prednisone dos pak  CT scan Follow up 3 days after to discuss results

## 2023-01-31 ENCOUNTER — Other Ambulatory Visit: Payer: Self-pay | Admitting: Family Medicine

## 2023-02-03 DIAGNOSIS — N401 Enlarged prostate with lower urinary tract symptoms: Secondary | ICD-10-CM | POA: Diagnosis not present

## 2023-02-03 DIAGNOSIS — N35811 Other urethral stricture, male, meatal: Secondary | ICD-10-CM | POA: Diagnosis not present

## 2023-02-03 DIAGNOSIS — R3914 Feeling of incomplete bladder emptying: Secondary | ICD-10-CM | POA: Diagnosis not present

## 2023-02-04 ENCOUNTER — Other Ambulatory Visit: Payer: Self-pay | Admitting: Sports Medicine

## 2023-02-04 ENCOUNTER — Ambulatory Visit (INDEPENDENT_AMBULATORY_CARE_PROVIDER_SITE_OTHER): Payer: Medicare PPO

## 2023-02-04 DIAGNOSIS — M5136 Other intervertebral disc degeneration, lumbar region: Secondary | ICD-10-CM

## 2023-02-04 DIAGNOSIS — G8929 Other chronic pain: Secondary | ICD-10-CM | POA: Diagnosis not present

## 2023-02-04 DIAGNOSIS — M47816 Spondylosis without myelopathy or radiculopathy, lumbar region: Secondary | ICD-10-CM

## 2023-02-04 DIAGNOSIS — M5442 Lumbago with sciatica, left side: Secondary | ICD-10-CM

## 2023-02-04 DIAGNOSIS — M545 Low back pain, unspecified: Secondary | ICD-10-CM | POA: Diagnosis not present

## 2023-02-04 DIAGNOSIS — M5441 Lumbago with sciatica, right side: Secondary | ICD-10-CM | POA: Diagnosis not present

## 2023-02-04 NOTE — Progress Notes (Unsigned)
right-sided L3-L4.

## 2023-02-04 NOTE — Progress Notes (Unsigned)
Epidural placed right-sided L3-L4.

## 2023-02-05 ENCOUNTER — Telehealth: Payer: Self-pay | Admitting: *Deleted

## 2023-02-05 NOTE — Telephone Encounter (Signed)
   Pre-operative Risk Assessment    Patient Name: [name]  DOB: August 11, 1946 MRN: [id number]      Request for Surgical Clearance    Procedure:   LUMBAR EPIDURAL INJECTION  Date of Surgery:  Clearance TBD                                 Surgeon:  NOT LISTED Surgeon's Group or Practice Name:  [facility]  Phone number:  [phone] ATTN: CATHY C.  Fax number:  [phone]   Type of Clearance Requested:   - Medical  - Pharmacy:  Hold Rivaroxaban (Xarelto) x  3 DAYS PRIOR   Type of Anesthesia:  Local    Additional requests/questions:    Jiles Prows   02/05/2023, 4:42 PM

## 2023-02-08 ENCOUNTER — Telehealth: Payer: Self-pay

## 2023-02-08 NOTE — Telephone Encounter (Signed)
Patient with diagnosis of atrial fibrillation on xarelto for anticoagulation.    Procedure: lumbar epidural injection Date of procedure: TBD   CHA2DS2-VASc Score = 7   This indicates a 11.2% annual risk of stroke. The patient's score is based upon: CHF History: 1 HTN History: 1 Diabetes History: 0 Stroke History: 2 Vascular Disease History: 1 Age Score: 2 Gender Score: 0   Chart notes CVA occurred after remote cath (1996?) with no residual deficits  CrCl 57 Platelet count 292  Per office protocol, patient can hold Xarelto for 3 days prior to procedure.   Patient will not need bridging with Lovenox (enoxaparin) around procedure.  **This guidance is not considered finalized until pre-operative APP has relayed final recommendations.**

## 2023-02-08 NOTE — Telephone Encounter (Signed)
   Name: [name]  DOB: 1946-03-10  MRN: [id number]  Primary Cardiologist: Cristopher Peru, MD   Preoperative team, please contact this patient and set up a phone call appointment for further preoperative risk assessment. Please obtain consent and complete medication review. Thank you for your help.  I confirm that guidance regarding antiplatelet and oral anticoagulation therapy has been completed and, if necessary, noted below.  Per office protocol, patient can hold Xarelto for 3 days prior to procedure.   Patient will not need bridging with Lovenox (enoxaparin) around procedure.   Mable Fill, Marissa Nestle, NP 02/08/2023, 8:28 AM [place]

## 2023-02-08 NOTE — Telephone Encounter (Signed)
  Patient Consent for Virtual Visit         [name] has provided verbal consent on 02/08/2023 for a virtual visit (video or telephone).   CONSENT FOR VIRTUAL VISIT FOR:  [name]  By participating in this virtual visit I agree to the following:  I hereby voluntarily request, consent and authorize [facility] and its employed or contracted physicians, physician assistants, nurse practitioners or other licensed health care professionals (the Practitioner), to provide me with telemedicine health care services (the "Services") as deemed necessary by the treating Practitioner. I acknowledge and consent to receive the Services by the Practitioner via telemedicine. I understand that the telemedicine visit will involve communicating with the Practitioner through live audiovisual communication technology and the disclosure of certain medical information by electronic transmission. I acknowledge that I have been given the opportunity to request an in-person assessment or other available alternative prior to the telemedicine visit and am voluntarily participating in the telemedicine visit.  I understand that I have the right to withhold or withdraw my consent to the use of telemedicine in the course of my care at any time, without affecting my right to future care or treatment, and that the Practitioner or I may terminate the telemedicine visit at any time. I understand that I have the right to inspect all information obtained and/or recorded in the course of the telemedicine visit and may receive copies of available information for a reasonable fee.  I understand that some of the potential risks of receiving the Services via telemedicine include:  Delay or interruption in medical evaluation due to technological equipment failure or disruption; Information transmitted may not be sufficient (e.g. poor resolution of images) to allow for appropriate medical decision making by the  Practitioner; and/or  In rare instances, security protocols could fail, causing a breach of personal health information.  Furthermore, I acknowledge that it is my responsibility to provide information about my medical history, conditions and care that is complete and accurate to the best of my ability. I acknowledge that Practitioner's advice, recommendations, and/or decision may be based on factors not within their control, such as incomplete or inaccurate data provided by me or distortions of diagnostic images or specimens that may result from electronic transmissions. I understand that the practice of medicine is not an exact science and that Practitioner makes no warranties or guarantees regarding treatment outcomes. I acknowledge that a copy of this consent can be made available to me via my patient portal ([facility]), or I can request a printed copy by calling the office of [facility].    I understand that my insurance will be billed for this visit.   I have read or had this consent read to me. I understand the contents of this consent, which adequately explains the benefits and risks of the Services being provided via telemedicine.  I have been provided ample opportunity to ask questions regarding this consent and the Services and have had my questions answered to my satisfaction. I give my informed consent for the services to be provided through the use of telemedicine in my medical care

## 2023-02-08 NOTE — Telephone Encounter (Signed)
Patient agreeable with telehealth appointment.   Med list reviewed and consent given.

## 2023-02-11 ENCOUNTER — Other Ambulatory Visit: Payer: Self-pay | Admitting: Family Medicine

## 2023-02-11 ENCOUNTER — Ambulatory Visit: Payer: Medicare PPO | Attending: Nurse Practitioner | Admitting: Nurse Practitioner

## 2023-02-11 DIAGNOSIS — D0461 Carcinoma in situ of skin of right upper limb, including shoulder: Secondary | ICD-10-CM | POA: Diagnosis not present

## 2023-02-11 DIAGNOSIS — Z01810 Encounter for preprocedural cardiovascular examination: Secondary | ICD-10-CM

## 2023-02-11 DIAGNOSIS — C44622 Squamous cell carcinoma of skin of right upper limb, including shoulder: Secondary | ICD-10-CM | POA: Diagnosis not present

## 2023-02-11 NOTE — Progress Notes (Signed)
   Virtual Visit via Telephone Note   Because of [name]'s co-morbid illnesses, he is at least at moderate risk for complications without adequate follow up.  This format is felt to be most appropriate for this patient at this time.  The patient did not have access to video technology/had technical difficulties with video requiring transitioning to audio format only (telephone).  All issues noted in this document were discussed and addressed.  No physical exam could be performed with this format.  Please refer to the patient's chart for his consent to telehealth for [facility].  Evaluation Performed:  Preoperative cardiovascular risk assessment _____________   Date:  02/11/2023   Patient ID:  [name], DOB August 20, 1946, MRN [id number] Patient Location:  Home Provider location:   Office  Primary Care Provider:  Marin Olp, MD Primary Cardiologist:  Cristopher Peru, MD  Chief Complaint / Patient Profile   77 y.o. y/o male with a h/o paroxysmal atrial fibrillation/atrial flutter on Xarelto, sinus node dysfunction s/p PPM, obstructive CAD, hypertension, and dyslipidemia who is pending lumbar epidural injection with [facility] and presents today for telephonic preoperative cardiovascular risk assessment.  History of Present Illness    [name] is a 77 y.o. male who presents via audio/video conferencing for a telehealth visit today.  Pt was last seen in cardiology clinic on 11/17/2022 by Dr. Lovena Le. At that time [name] was doing well.  The patient is now pending procedure as outlined above. Since his last visit, he has noticed elevated heart rates with resting heart rate in the low 100s (baseline resting HR 70 bpm), as well as increased shortness of breath.   Assessment & Plan    1.  Preoperative Cardiovascular Risk Assessment:  Pt was advised that he will need an office visit given new symptoms prior to surgical clearance. I  will reach out to our preoperative coverage team for pt to be scheduled for an office visit (Dr. Lovena Le is primary cardiologist/EP) at earliest available time.  Time:    Today, I have spent 5 minutes with the patient with telehealth technology discussing medical history, symptoms, and management plan.     Lenna Sciara, NP  02/11/2023, 10:37 AM

## 2023-02-11 NOTE — Telephone Encounter (Signed)
I s/w the pt and he has been scheduled for in office appt per pre op app today Diona Browner, NP, see ov note from NP today for further info. Pt has been scheduled to see Tommye Standard, [facility] 8:50 am. Pt thanked me for the help. I will update all parties involved. Once PA has cleared the pt she will send her notes to the surgeon office.

## 2023-02-11 NOTE — Progress Notes (Signed)
PT HAS APPT 02/12/23 WITH RENEE URSUY, PAC

## 2023-02-12 ENCOUNTER — Ambulatory Visit: Payer: Medicare PPO | Attending: Physician Assistant | Admitting: Physician Assistant

## 2023-02-12 ENCOUNTER — Encounter: Payer: Self-pay | Admitting: Physician Assistant

## 2023-02-12 VITALS — BP 116/77 | HR 96 | Ht 68.0 in | Wt 168.2 lb

## 2023-02-12 DIAGNOSIS — Z950 Presence of cardiac pacemaker: Secondary | ICD-10-CM

## 2023-02-12 DIAGNOSIS — Z01818 Encounter for other preprocedural examination: Secondary | ICD-10-CM | POA: Diagnosis not present

## 2023-02-12 DIAGNOSIS — D6869 Other thrombophilia: Secondary | ICD-10-CM

## 2023-02-12 DIAGNOSIS — I2510 Atherosclerotic heart disease of native coronary artery without angina pectoris: Secondary | ICD-10-CM

## 2023-02-12 DIAGNOSIS — I480 Paroxysmal atrial fibrillation: Secondary | ICD-10-CM

## 2023-02-12 DIAGNOSIS — I10 Essential (primary) hypertension: Secondary | ICD-10-CM | POA: Diagnosis not present

## 2023-02-12 LAB — CUP PACEART INCLINIC DEVICE CHECK
Date Time Interrogation Session: 20240315122801
Implantable Lead Connection Status: 753985
Implantable Lead Connection Status: 753985
Implantable Lead Implant Date: 20031017
Implantable Lead Implant Date: 20031017
Implantable Lead Location: 753859
Implantable Lead Location: 753860
Implantable Lead Model: 5076
Implantable Lead Model: 5076
Implantable Pulse Generator Implant Date: 20120210
Lead Channel Pacing Threshold Amplitude: 1 V
Lead Channel Pacing Threshold Amplitude: 1.2 V
Lead Channel Pacing Threshold Pulse Width: 0.4 ms
Lead Channel Pacing Threshold Pulse Width: 0.4 ms
Lead Channel Sensing Intrinsic Amplitude: 14.7 mV
Pulse Gen Serial Number: 66063261

## 2023-02-12 NOTE — Progress Notes (Signed)
Cardiology Office Note Date:  02/12/2023  Patient ID:  Nathan, Higgins 1946/01/22, MRN [id number] PCP:  Marin Olp, MD  Cardiologist: D. Vemulapalli Electrophysiologist: Dr. Lovena Le    Chief Complaint:  pre-op  History of Present Illness: [name] is a 77 y.o. male with history of HTN, HLD, AFib, stroke (post cath), SND w/PPM, CAD, , VHD (bicuspid AV), AFib/flutter, Chronic CHF (systolic (recovered EF), NICM  He saw Dr. Miki Kins 06/29/22, continues to swim, though less with more vacationing, report that he gets occasional episodes where he feels like his heart rate drops when he swims and he is not able to exert himself as much until it comes back to normal a few minutes later. He is following with electrophysiology at [facility] health and has been having interrogations of his pacemaker.  Discussed CT scan report with perhaps exception of the 75% D1. Size of D1 not clear, and given prior CVA with LHC high threshold for intervention: plan to pursue aggressive risk factor management. Prior cMRI showed an infarct in the diag distribution but no significant peri-infarct ischemia or ischemia in the RCA or LCX territories. He has mild CP while walking, but no CP with more rigorous exercise, which argues against ischemic etiology of his sx. Lipid panel much improved since starting the icosopent ethyl.  No changes were made.   He saw Dr. Lovena Le 11/17/22, doing well, swimming, noted PVCs, good exercise tolerance and chronotropic response.  NO changes were made.  Needs clearance for epidural RCRI score is 3 (for some degree of CAD and prior stroke), 11% risk  TODAY In the last couple weeks an observation of elevated HRs unusually 90's-110's at rest, not sustained, perhaps associated with a bit of jittery feeling. Doesn't feel like he is sleeping well. He has his back pain, actually better the last couple days, but continues to swim No CP No SOB No near syncope or  syncope. Does say that when at peak exercise in the pool he does tend to hit a wall with a sudden fall in HR, has to stop, rest on the lane divider. He says Dr. Lovena Le has felt that was his PVCs.   Device information Biotronik dual chamber PPM implanted 09/15/2002, gen change 01/09/2011 (Has MDT leads)   Past Medical History:  Diagnosis Date   Abscess    right posterior neck   Anticoagulation adequate 06/07/2019   Overview:  SSS, paroxAFib/Flutter, CHADSvasc= 3 age, h/o cva; chronic Datto Xarelto   Atrial fibrillation and flutter ([place]) 01/01/2015   Noted 2016. Xarelto    BPH associated with nocturia 08/14/2014   Finasteride, flomax.  0-3x nocturia. PSA to 14-->eval urology [facility]. PSA trended back down to about 2.6 per records in 2015 then placed on finasteride- had significant voiding issues when spiked to 14- improved on meds    Bradycardia    CAD (coronary artery disease) 05/26/2016   Coronary CTA_ 50-75% D1 off CT report    Cardiac pacemaker in situ 12/28/2009   SA node dysfunction as cause    Chest pressure 12/01/2015   Overview:  atypical CP, [facility] nuclear perfusion imaging w/ no inarct, no ischemia, EF-40% NICM   Chronic systolic heart failure ([place]) 12/01/2015   Overview:  [facility] nuclear perfusion imaging study, no inarct, no ischemia, EF-40% NICM   CVA (cerebral vascular accident) ([place])    Double vision 07/04/2018   Elevated LFTs    Erectile dysfunction 01/30/2015   cialis prn    External hemorrhoids  Fitting or adjustment of cardiac pacemaker 06/07/2019   Overview:  [place] [id number], RA and RV leads [id number] and [id number]   Former smoker 01/30/2015   Former smoker. 20 pack years quit 92. Patient declined AAA screen.     GERD (gastroesophageal reflux disease)    Gout 11/11/2009   Qualifier: Diagnosis of  By: Lynden Ang     History of nuclear stress test 06/07/2019   Overview:  [facility], no inarct, no ischemia, EF-40% NICM   HTN (hypertension)    Hyperglycemia  06/10/2017   110s CBG 2018   Hyperlipidemia 07/12/2007   Crestor 20mg  daily with some myalgias; fish oil. Trig 200-500 despite this. LDL ok.        Insomnia 01/30/2015   ambien 5mg  prn. May refill.     Ischemic optic neuropathy    Left chest pressure 03/19/2015   Other and unspecified hyperlipidemia    Sinoatrial node dysfunction (HCC)    Spinal stenosis    Trigger finger, right middle finger 03/01/2018    Past Surgical History:  Procedure Laterality Date   ABSCESS DRAINAGE     righ tposterior neck   CARDIAC CATHETERIZATION  1996   CHOLECYSTECTOMY  02/07/2003   COLONOSCOPY  06/23/2004   EYE SURGERY     left eye   left ingunial hernia  06/08/2011   PACEMAKER INSERTION     right hip replacement     2012 Dr. Maureen Ralphs    Current Outpatient Medications  Medication Sig Dispense Refill   benazepril (LOTENSIN) 5 MG tablet Take 1 tablet (5 mg total) by mouth daily. 90 tablet 3   cyclobenzaprine (FLEXERIL) 5 MG tablet Take 1 tablet (5 mg total) by mouth 3 (three) times daily as needed for muscle spasms. 30 tablet 2   finasteride (PROSCAR) 5 MG tablet Take 1 tablet by mouth once daily 90 tablet 0   gemfibrozil (LOPID) 600 MG tablet TAKE 1 TABLET BY MOUTH TWICE DAILY BEFORE  A  MEAL 180 tablet 3   icosapent Ethyl (VASCEPA) 1 g capsule Take 1 g by mouth 2 (two) times daily.     indomethacin (INDOCIN) 25 MG capsule TAKE 1 CAPSULE BY MOUTH TWICE DAILY WITH A MEAL AS NEEDED FOR PAIN. 3  DAYS  MAXIMUM 30 capsule 0   metoprolol succinate (TOPROL-XL) 25 MG 24 hr tablet Take 1 tablet (25 mg total) by mouth daily. 90 tablet 3   Multiple Vitamin (MULTIVITAMIN) tablet Take 1 tablet by mouth daily.     omeprazole (PRILOSEC) 20 MG capsule Take 1 capsule by mouth once daily 90 capsule 0   rosuvastatin (CRESTOR) 40 MG tablet Take 1 tablet by mouth once daily 90 tablet 0   tadalafil (CIALIS) 5 MG tablet TAKE ONE TABLET BY MOUTH DAILY AS NEEDED FOR ERECTILE DYSFUNCTION 90 tablet 3   tamsulosin (FLOMAX) 0.4 MG  CAPS capsule Take 1 capsule by mouth once daily 90 capsule 0   XARELTO 20 MG TABS tablet TAKE 1 TABLET BY MOUTH ONCE DAILY WITH SUPPER 90 tablet 2   zolpidem (AMBIEN) 5 MG tablet TAKE 1 TABLET BY MOUTH AT BEDTIME 90 tablet 0   No current facility-administered medications for this visit.    Allergies:   Patient has no known allergies.   Social History:  The patient  reports that he quit smoking about 31 years ago. His smoking use included cigarettes. He has a 20.00 pack-year smoking history. He has never used smokeless tobacco. He reports current alcohol use. He  reports that he does not use drugs.   Family History:  The patient's family history includes Brain cancer in his mother; Cancer in his brother; Diabetes in his father and paternal grandfather; Healthy in his brother; Heart attack in his father; Heart attack (age of onset: 54) in his mother; Hypertension in his mother; Lung cancer in his mother.  ROS:  Please see the history of present illness.    All other systems are reviewed and otherwise negative.   PHYSICAL EXAM:  VS:  There were no vitals taken for this visit. BMI: There is no height or weight on file to calculate BMI. Well nourished, well developed, in no acute distress HEENT: normocephalic, atraumatic Neck: no JVD, carotid bruits or masses Cardiac:  RRR; no significant murmurs, no rubs, or gallops Lungs:  CTA b/l, no wheezing, rhonchi or rales Abd: soft, nontender MS: no deformity or atrophy Ext:  no edema Skin: warm and dry, no rash Neuro:  No gross deficits appreciated Psych: euthymic mood, full affect  PPM site is stable, no tethering or discomfort   EKG:  Done today and reviewed by myself shows  #1 comes in appears an ATach/V paced rhythm 96bpm #2 is AP/Vsensed 60bpm, diffuse T changes, unchanged from prior   Device interrogation done today and reviewed by myself:  Battery and lead measurements are good 02/04/23 he had several short episodes of AFib, V rates  variable 90's-110s No HVR episodes HR histograms look good, no rates approaching his max tracking CLS is on medium AP 95% VP 29%    09/04/21: cardiac MRI Comparison with prior stress cMRI (07/13/2016).  1. The left ventricle is normal in cavity size. There is mild basal sigmoid hypertrophy (max: 1.3 cm). The  basal anteroseptum is focally hypokinetic. The LVEF is preserved at 60% (prior 50%).  2. The right ventricle is normal in cavity size, wall thickness, and systolic function. A pacemaker lead is  seen traversing the tricuspid valve and terminating in the apical septum.  4. Both atria are normal in size. A pacemaker lead is seen in the right atrium, terminating in the right  atrial appendage.  4. The aortic valve is bicommissural in morphology.  There is a raphe between the non and right coronary  cusps. The leaflets are thickened.  There is mild aortic stenosis.  Peak aortic velocity measures 2.4 m/s  (prior 1.7 m/s) with peak/mean gradients of 23/10 mm Hg (prior mean gradient 6 mm Hg). Aortic valve area  measures 1.4 cm2 by planimetry.  There is mild aortic regurgitation.  5. There is mild thickening of the mitral valve leaflets. There is no significant mitral stenosis and mild  mitral regurgitation.  6. There is at least mild tricuspid regurgitation. Complete evaluation is limited by metal artifact.  7. There is mild pulmonic regurgitation.  8. Delayed enhancement MRI is abnormal.  There is hyperenhancement in the basal anteroseptum consistent  with a subendocardial myocardial infarction in the distribution of the LAD (~3% total LV myocardial mass).  The majority of the LAD perfusion territory is viable.  All other segments are fully viable.  9. There are no intracardiac thrombi.   There has been no significant change compared with prior cardiac MRI.   Chest MRA:   1. The aortic root is normal in size. The ascending aorta, arch, and descending aorta are normal in  diameter.  There is no dissection seen. 2. The aortic arch is left sided. There is normal branching of the arch vessels.  3. The main and proximal branch pulmonary arteries are normal in size.  4. Normal systemic venous connections.  5.  There is no evidence of an intracardiac thrombus.  6. The pericardium is normal in thickness.  There is no pericardial effusion.    Bi-orthogonal luminal aortic dimensions are listed below:   Sinus of Valsalva (largest dimension, cross-sectional dimension): 3.4 x 2.7 cm  Sinotubular junction: 2.8 x 2.3 cm  Mid-ascending aorta: 3.4 x 3.3 cm  Proximal aortic arch (aorta at the origin of the innominate artery):3.2 x 3.2 cm  Mid-aortic arch (between left common carotid and subclavian arteries): 2.8 x 2.7 cm  Proximal descending thoracic aorta (begins at the isthmus, approximately 2 cm distal to the left subclavian  artery): 2.4 x 2.3 cm  Mid-descending aorta: 2.4 x 2.3 cm  Aorta at diaphragm: 2.3 x 2.2 cm     04/29/21: EST Blood pressure demonstrated a normal response to exercise. There was no ST segment deviation noted during stress.   04/13/2018: TTE INTERPRETATION  NORMAL LV SYSTOLIC FUNCTION WITH MILD DIASTOLIC DYSFUNCTION  MILD, BICUSPID AORTIC VALVE STENOSIS  NORMAL RV SIZE AND FUNCTION  MILDLY ELEVATED PULMONARY PRESSURES  COMPARED WITH ECHO FROM 09/2016, AORTIC STENOSIS IS NOW MILD  RECOMMEND REPEAT ECHO IN 2-3 YEARS FOR AORTIC VALVE SURVEILLANCE.    02/03/2016: stress myoview Nuclear stress EF: 40%. ST segment depression was noted during stress in the V5, V6, III and aVF leadsST deviation beginning in recovery. This is an intermediate risk study. The left ventricular ejection fraction is moderately decreased (30-44%).   08/19/15: TTE Study Conclusions  - Left ventricle: The cavity size was normal. There was moderate    concentric hypertrophy. Systolic function was normal. The    estimated ejection fraction was in the range of 60% to 65%. Wall     motion was normal; there were no regional wall motion    abnormalities. The study was not technically sufficient to allow    evaluation of LV diastolic dysfunction due to atrial flutter.  - Aortic valve: Trileaflet; severely thickened, severely calcified    leaflets. Valve mobility was restricted. There was mild    regurgitation.  - Aortic root: The aortic root was normal in size.  - Mitral valve: Structurally normal valve.  - Left atrium: The atrium was normal in size.  - Right ventricle: Systolic function was normal.  - Right atrium: The atrium was normal in size.  - Tricuspid valve: There was trivial regurgitation.  - Pulmonary arteries: Systolic pressure was within the normal    range.  - Inferior vena cava: The vessel was normal in size. The    respirophasic diameter changes were in the normal range (= 50%),    consistent with normal central venous pressure.  - Pericardium, extracardiac: There was no pericardial effusion.    Recent Labs: 11/17/2022: ALT 17; BUN 19; Creatinine, Ser 1.26; Hemoglobin 14.9; Platelets 292.0; Potassium 4.9; Sodium 139  03/09/2022: Cholesterol 148; HDL 43.30; LDL Cholesterol 65; Total CHOL/HDL Ratio 3; Triglycerides 198.0; VLDL 39.6   CrCl cannot be calculated (Patient's most recent lab result is older than the maximum 21 days allowed.).   Wt Readings from Last 3 Encounters:  01/28/23 178 lb (80.7 kg)  12/01/22 171 lb (77.6 kg)  11/17/22 170 lb 3.2 oz (77.2 kg)     Other studies reviewed: Additional studies/records reviewed today include: summarized above  ASSESSMENT AND PLAN:  PPM Intact function  Initially wondered if he was reaching a block rate based  on his c/o with swimming but his HR histograms do not suggest that His watch data shows him getting to the 140's   No remotes, office checks only  CAD No anginal sounding complaints No CP with swimming C/w his attending cardiologist  VHD, bicuspid AV Mild AS  HTN Looks ok  5.  Paroxysmal Afib CHA2DS2Vasc is 6, on Xarelto Low burden Has had some of late that is likely his observation of elevated HRs Looks like he came in in a atrial tachycardia (96), tracking by his device Discussed increasing his Toprol, for now he would prefer watchful waiting, no changes   6. PVCs None noted today  7. Secondary hypercoagulable state  8.  Pre-op Low cardiac risk procedure No need for pacemaker management peri-procedure for epidural No new cardiac w/u needed No cardiac contraindication to epidural procedure OK  to hold Xarelto peri-procedure, 3 days   Disposition: recommend a 3 mo f/u to revisit his AFib burden, management.   Sooner if needed.  Current medicines are reviewed at length with the patient today.  The patient did not have any concerns regarding medicines.  Venetia Night, PA-C 02/12/2023 6:01 AM     [facility] [address] [place] [address] [phone] (office)  [phone] (fax)

## 2023-02-12 NOTE — Patient Instructions (Signed)
Medication Instructions:   Your physician recommends that you continue on your current medications as directed. Please refer to the Current Medication list given to you today.  *If you need a refill on your cardiac medications before your next appointment, please call your pharmacy*   Lab Work:  [place]    If you have labs (blood work) drawn today and your tests are completely normal, you will receive your results only by: [place] (if you have MyChart) OR A paper copy in the mail If you have any lab test that is abnormal or we need to change your treatment, we will call you to review the results.   Testing/Procedures: NONE ORDERED  TODAY     Follow-Up: At [facility], you and your health needs are our priority.  As part of our continuing mission to provide you with exceptional heart care, we have created designated Provider Care Teams.  These Care Teams include your primary Cardiologist (physician) and Advanced Practice Providers (APPs -  Physician Assistants and Nurse Practitioners) who all work together to provide you with the care you need, when you need it.  We recommend signing up for the patient portal called "MyChart".  Sign up information is provided on this After Visit Summary.  MyChart is used to connect with patients for Virtual Visits (Telemedicine).  Patients are able to view lab/test results, encounter notes, upcoming appointments, etc.  Non-urgent messages can be sent to your provider as well.   To learn more about what you can do with MyChart, go to [email or web address].    Your next appointment:   3 month(s)  Provider:   Cristopher Peru, MD    Other Instructions

## 2023-02-16 ENCOUNTER — Telehealth: Payer: Self-pay | Admitting: Family Medicine

## 2023-02-16 NOTE — Telephone Encounter (Signed)
Contacted [name] to schedule their annual wellness visit. Appointment made for 02/22/2023.  [place] Direct Dial [phone]

## 2023-02-19 DIAGNOSIS — L089 Local infection of the skin and subcutaneous tissue, unspecified: Secondary | ICD-10-CM | POA: Diagnosis not present

## 2023-02-22 ENCOUNTER — Ambulatory Visit (INDEPENDENT_AMBULATORY_CARE_PROVIDER_SITE_OTHER): Payer: Medicare PPO

## 2023-02-22 VITALS — Wt 168.0 lb

## 2023-02-22 DIAGNOSIS — Z0000 Encounter for general adult medical examination without abnormal findings: Secondary | ICD-10-CM

## 2023-02-22 NOTE — Patient Instructions (Signed)
[name] , Thank you for taking time to come for your Medicare Wellness Visit. I appreciate your ongoing commitment to your health goals. Please review the following plan we discussed and let me know if I can assist you in the future.   These are the goals we discussed:  Goals      patient stated     To maintain your health      Patient Stated     Keep living      Patient Stated     Stay healthy and active         This is a list of the screening recommended for you and due dates:  Health Maintenance  Topic Date Due   Zoster (Shingles) Vaccine (1 of 2) Never done   COVID-19 Vaccine (4 - 2023-24 season) 07/31/2022   Colon Cancer Screening  04/24/2023   Medicare Annual Wellness Visit  02/22/2024   Pneumonia Vaccine  Completed   Flu Shot  Completed   Hepatitis C Screening: USPSTF Recommendation to screen - Ages 18-79 yo.  Completed   HPV Vaccine  Aged Out   DTaP/Tdap/Td vaccine  Discontinued    Advanced directives: Please bring a copy of your health care power of attorney and living will to the office at your convenience.  Conditions/risks identified: stay healthy and active   Next appointment: Follow up in one year for your annual wellness visit.   Preventive Care 52 Years and Older, Male  Preventive care refers to lifestyle choices and visits with your health care provider that can promote health and wellness. What does preventive care include? A yearly physical exam. This is also called an annual well check. Dental exams once or twice a year. Routine eye exams. Ask your health care provider how often you should have your eyes checked. Personal lifestyle choices, including: Daily care of your teeth and gums. Regular physical activity. Eating a healthy diet. Avoiding tobacco and drug use. Limiting alcohol use. Practicing safe sex. Taking low doses of aspirin every day. Taking vitamin and mineral supplements as recommended by your health care provider. What  happens during an annual well check? The services and screenings done by your health care provider during your annual well check will depend on your age, overall health, lifestyle risk factors, and family history of disease. Counseling  Your health care provider may ask you questions about your: Alcohol use. Tobacco use. Drug use. Emotional well-being. Home and relationship well-being. Sexual activity. Eating habits. History of falls. Memory and ability to understand (cognition). Work and work Statistician. Screening  You may have the following tests or measurements: Height, weight, and BMI. Blood pressure. Lipid and cholesterol levels. These may be checked every 5 years, or more frequently if you are over 109 years old. Skin check. Lung cancer screening. You may have this screening every year starting at age 61 if you have a 30-pack-year history of smoking and currently smoke or have quit within the past 15 years. Fecal occult blood test (FOBT) of the stool. You may have this test every year starting at age 48. Flexible sigmoidoscopy or colonoscopy. You may have a sigmoidoscopy every 5 years or a colonoscopy every 10 years starting at age 32. Prostate cancer screening. Recommendations will vary depending on your family history and other risks. Hepatitis C blood test. Hepatitis B blood test. Sexually transmitted disease (STD) testing. Diabetes screening. This is done by checking your blood sugar (glucose) after you have not eaten for a while (fasting).  You may have this done every 1-3 years. Abdominal aortic aneurysm (AAA) screening. You may need this if you are a current or former smoker. Osteoporosis. You may be screened starting at age 38 if you are at high risk. Talk with your health care provider about your test results, treatment options, and if necessary, the need for more tests. Vaccines  Your health care provider may recommend certain vaccines, such as: Influenza vaccine. This  is recommended every year. Tetanus, diphtheria, and acellular pertussis (Tdap, Td) vaccine. You may need a Td booster every 10 years. Zoster vaccine. You may need this after age 29. Pneumococcal 13-valent conjugate (PCV13) vaccine. One dose is recommended after age 72. Pneumococcal polysaccharide (PPSV23) vaccine. One dose is recommended after age 71. Talk to your health care provider about which screenings and vaccines you need and how often you need them. This information is not intended to replace advice given to you by your health care provider. Make sure you discuss any questions you have with your health care provider. Document Released: 12/13/2015 Document Revised: 08/05/2016 Document Reviewed: 09/17/2015 Elsevier Interactive Patient Education  2017 [place] Prevention in the Home Falls can cause injuries. They can happen to people of all ages. There are many things you can do to make your home safe and to help prevent falls. What can I do on the outside of my home? Regularly fix the edges of walkways and driveways and fix any cracks. Remove anything that might make you trip as you walk through a door, such as a raised step or threshold. Trim any bushes or trees on the path to your home. Use bright outdoor lighting. Clear any walking paths of anything that might make someone trip, such as rocks or tools. Regularly check to see if handrails are loose or broken. Make sure that both sides of any steps have handrails. Any raised decks and porches should have guardrails on the edges. Have any leaves, snow, or ice cleared regularly. Use sand or salt on walking paths during winter. Clean up any spills in your garage right away. This includes oil or grease spills. What can I do in the bathroom? Use night lights. Install grab bars by the toilet and in the tub and shower. Do not use towel bars as grab bars. Use non-skid mats or decals in the tub or shower. If you need to sit down  in the shower, use a plastic, non-slip stool. Keep the floor dry. Clean up any water that spills on the floor as soon as it happens. Remove soap buildup in the tub or shower regularly. Attach bath mats securely with double-sided non-slip rug tape. Do not have throw rugs and other things on the floor that can make you trip. What can I do in the bedroom? Use night lights. Make sure that you have a light by your bed that is easy to reach. Do not use any sheets or blankets that are too big for your bed. They should not hang down onto the floor. Have a firm chair that has side arms. You can use this for support while you get dressed. Do not have throw rugs and other things on the floor that can make you trip. What can I do in the kitchen? Clean up any spills right away. Avoid walking on wet floors. Keep items that you use a lot in easy-to-reach places. If you need to reach something above you, use a strong step stool that has a grab bar. Keep  electrical cords out of the way. Do not use floor polish or wax that makes floors slippery. If you must use wax, use non-skid floor wax. Do not have throw rugs and other things on the floor that can make you trip. What can I do with my stairs? Do not leave any items on the stairs. Make sure that there are handrails on both sides of the stairs and use them. Fix handrails that are broken or loose. Make sure that handrails are as long as the stairways. Check any carpeting to make sure that it is firmly attached to the stairs. Fix any carpet that is loose or worn. Avoid having throw rugs at the top or bottom of the stairs. If you do have throw rugs, attach them to the floor with carpet tape. Make sure that you have a light switch at the top of the stairs and the bottom of the stairs. If you do not have them, ask someone to add them for you. What else can I do to help prevent falls? Wear shoes that: Do not have high heels. Have rubber bottoms. Are comfortable  and fit you well. Are closed at the toe. Do not wear sandals. If you use a stepladder: Make sure that it is fully opened. Do not climb a closed stepladder. Make sure that both sides of the stepladder are locked into place. Ask someone to hold it for you, if possible. Clearly mark and make sure that you can see: Any grab bars or handrails. First and last steps. Where the edge of each step is. Use tools that help you move around (mobility aids) if they are needed. These include: Canes. Walkers. Scooters. Crutches. Turn on the lights when you go into a dark area. Replace any light bulbs as soon as they burn out. Set up your furniture so you have a clear path. Avoid moving your furniture around. If any of your floors are uneven, fix them. If there are any pets around you, be aware of where they are. Review your medicines with your doctor. Some medicines can make you feel dizzy. This can increase your chance of falling. Ask your doctor what other things that you can do to help prevent falls. This information is not intended to replace advice given to you by your health care provider. Make sure you discuss any questions you have with your health care provider. Document Released: 09/12/2009 Document Revised: 04/23/2016 Document Reviewed: 12/21/2014 Elsevier Interactive Patient Education  2017 [employer].

## 2023-02-22 NOTE — Progress Notes (Signed)
I connected with  [name] on 02/22/23 by a audio enabled telemedicine application and verified that I am speaking with the correct person using two identifiers.  Patient Location: Home  Provider Location: Office/Clinic  I discussed the limitations of evaluation and management by telemedicine. The patient expressed understanding and agreed to proceed.  Subjective:   [name] is a 77 y.o. male who presents for Medicare Annual/Subsequent preventive examination.  Review of Systems     Cardiac Risk Factors include: advanced age (>61men, >87 women);male gender;dyslipidemia;hypertension     Objective:    Today's Vitals   02/22/23 1434  Weight: 168 lb (76.2 kg)   Body mass index is 25.54 kg/m.     02/22/2023    2:39 PM 02/19/2022    3:46 PM 02/15/2020    1:37 PM 06/07/2019    7:35 AM 07/06/2017    2:36 PM  Advanced Directives  Does Patient Have a Medical Advance Directive? Yes Yes Yes No Yes  Type of Paramedic of [place];Living will [place]    Does patient want to make changes to medical advance directive?   No - Patient declined    Copy of [place] in Chart? No - copy requested No - copy requested No - copy requested      Current Medications (verified) Outpatient Encounter Medications as of 02/22/2023  Medication Sig   benazepril (LOTENSIN) 5 MG tablet Take 1 tablet (5 mg total) by mouth daily.   cyclobenzaprine (FLEXERIL) 5 MG tablet Take 1 tablet (5 mg total) by mouth 3 (three) times daily as needed for muscle spasms.   doxycycline (VIBRAMYCIN) 100 MG capsule 1 capsule Orally Twice a day with food for 10 days   finasteride (PROSCAR) 5 MG tablet Take 1 tablet by mouth once daily   gemfibrozil (LOPID) 600 MG tablet TAKE 1 TABLET BY MOUTH TWICE DAILY BEFORE  A  MEAL   icosapent Ethyl (VASCEPA) 1 g capsule Take 1 g by mouth 2 (two) times daily.   indomethacin  (INDOCIN) 25 MG capsule TAKE 1 CAPSULE BY MOUTH TWICE DAILY WITH A MEAL AS NEEDED FOR PAIN. 3  DAYS  MAXIMUM   metoprolol succinate (TOPROL-XL) 25 MG 24 hr tablet Take 1 tablet (25 mg total) by mouth daily.   Multiple Vitamin (MULTIVITAMIN) tablet Take 1 tablet by mouth daily.   omeprazole (PRILOSEC) 20 MG capsule Take 1 capsule by mouth once daily   rosuvastatin (CRESTOR) 40 MG tablet Take 1 tablet by mouth once daily   tadalafil (CIALIS) 5 MG tablet TAKE ONE TABLET BY MOUTH DAILY AS NEEDED FOR ERECTILE DYSFUNCTION   tamsulosin (FLOMAX) 0.4 MG CAPS capsule Take 1 capsule by mouth once daily   XARELTO 20 MG TABS tablet TAKE 1 TABLET BY MOUTH ONCE DAILY WITH SUPPER   zolpidem (AMBIEN) 5 MG tablet TAKE 1 TABLET BY MOUTH AT BEDTIME   No facility-administered encounter medications on file as of 02/22/2023.    Allergies (verified) Patient has no known allergies.   History: Past Medical History:  Diagnosis Date   Abscess    right posterior neck   Anticoagulation adequate 06/07/2019   Overview:  SSS, paroxAFib/Flutter, CHADSvasc= 3 age, h/o cva; chronic [place] Xarelto   Atrial fibrillation and flutter ([place]) 01/01/2015   Noted 2016. Xarelto    BPH associated with nocturia 08/14/2014   Finasteride, flomax.  0-3x nocturia. PSA to 14-->eval urology [facility]. PSA trended back down  to about 2.6 per records in 2015 then placed on finasteride- had significant voiding issues when spiked to 14- improved on meds    Bradycardia    CAD (coronary artery disease) 05/26/2016   Coronary CTA_ 50-75% D1 off CT report    Cardiac pacemaker in situ 12/28/2009   SA node dysfunction as cause    Chest pressure 12/01/2015   Overview:  atypical CP, [facility] nuclear perfusion imaging w/ no inarct, no ischemia, EF-40% NICM   Chronic systolic heart failure ([place]) 12/01/2015   Overview:  [facility] nuclear perfusion imaging study, no inarct, no ischemia, EF-40% NICM   CVA (cerebral vascular accident) ([place])    Double vision 07/04/2018   Elevated LFTs     Erectile dysfunction 01/30/2015   cialis prn    External hemorrhoids    Fitting or adjustment of cardiac pacemaker 06/07/2019   Overview:  [place] [id number], RA and RV leads [id number] and [id number]   Former smoker 01/30/2015   Former smoker. 20 pack years quit 92. Patient declined AAA screen.     GERD (gastroesophageal reflux disease)    Gout 11/11/2009   Qualifier: Diagnosis of  By: Lynden Ang     History of nuclear stress test 06/07/2019   Overview:  [facility], no inarct, no ischemia, EF-40% NICM   HTN (hypertension)    Hyperglycemia 06/10/2017   110s CBG 2018   Hyperlipidemia 07/12/2007   Crestor 20mg  daily with some myalgias; fish oil. Trig 200-500 despite this. LDL ok.        Insomnia 01/30/2015   ambien 5mg  prn. May refill.     Ischemic optic neuropathy    Left chest pressure 03/19/2015   Other and unspecified hyperlipidemia    Sinoatrial node dysfunction (HCC)    Spinal stenosis    Trigger finger, right middle finger 03/01/2018   Past Surgical History:  Procedure Laterality Date   ABSCESS DRAINAGE     righ tposterior neck   CARDIAC CATHETERIZATION  1996   CHOLECYSTECTOMY  02/07/2003   COLONOSCOPY  06/23/2004   EYE SURGERY     left eye   left ingunial hernia  06/08/2011   PACEMAKER INSERTION     right hip replacement     2012 Dr. Maureen Ralphs   Family History  Problem Relation Age of Onset   Lung cancer Mother        mets to brain   Heart attack Mother 41   Hypertension Mother    Brain cancer Mother    Heart attack Father        mid 59s, smoker.    Diabetes Father    Cancer Brother    Diabetes Paternal Grandfather    Healthy Brother    Colon cancer Neg Hx    Esophageal cancer Neg Hx    Stomach cancer Neg Hx    Rectal cancer Neg Hx    Social History   Socioeconomic History   Marital status: Married    Spouse name: Not on file   Number of children: 1   Years of education: 20   Highest education level: Not on file  Occupational History   Not  on file  Tobacco Use   Smoking status: Former    Packs/day: 1.00    Years: 20.00    Additional pack years: 0.00    Total pack years: 20.00    Types: Cigarettes    Quit date: 11/04/1991    Years since quitting: 31.3   Smokeless tobacco: Never  Vaping Use   Vaping Use: Never used  Substance and Sexual Activity   Alcohol use: Yes    Alcohol/week: 0.0 standard drinks of alcohol    Comment: occ   Drug use: No   Sexual activity: Not on file  Other Topics Concern   Not on file  Social History Narrative   Married. 1 child and 1 step child. 2 grandchildren.       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      [place]: swimming (states not fun), travel      Lives in a single story home   Social Determinants of Health   Financial Resource Strain: Low Risk  (02/22/2023)   Overall Financial Resource Strain (CARDIA)    Difficulty of Paying Living Expenses: Not hard at all  Food Insecurity: No Food Insecurity (02/22/2023)   Hunger Vital Sign    Worried About Running Out of Food in the Last Year: Never true    [place] in the Last Year: Never true  Transportation Needs: No Transportation Needs (02/22/2023)   PRAPARE - Hydrologist (Medical): No    Lack of Transportation (Non-Medical): No  Physical Activity: Sufficiently Active (02/22/2023)   Exercise Vital Sign    Days of Exercise per Week: 4 days    Minutes of Exercise per Session: 60 min  Stress: No Stress Concern Present (02/22/2023)   [place]    Feeling of Stress : Not at all  Social Connections: Moderately Integrated (02/22/2023)   Social Connection and Isolation Panel [NHANES]    Frequency of Communication with Friends and Family: More than three times a week    Frequency of Social Gatherings with Friends and Family: More than three times a week    Attends Religious Services: Never    Marine scientist or Organizations: Yes    Attends  Music therapist: 1 to 4 times per year    Marital Status: Married    Tobacco Counseling Counseling given: Not Answered   Clinical Intake:  Pre-visit preparation completed: Yes  Pain : No/denies pain     BMI - recorded: 25.54 Nutritional Status: BMI 25 -29 Overweight Nutritional Risks: None Diabetes: No  How often do you need to have someone help you when you read instructions, pamphlets, or other written materials from your doctor or pharmacy?: 1 - Never  Diabetic?no  Interpreter Needed?: No  Information entered by :: Charlott Rakes, LPN   Activities of Daily Living    02/22/2023    2:40 PM  In your present state of health, do you have any difficulty performing the following activities:  Hearing? 1  Vision? 0  Difficulty concentrating or making decisions? 1  Comment not as easy as use to be  Walking or climbing stairs? 0  Dressing or bathing? 0  Doing errands, shopping? 0  Preparing Food and eating ? N  Using the Toilet? N  In the past six months, have you accidently leaked urine? N  Do you have problems with loss of bowel control? N  Managing your Medications? N  Managing your Finances? N  Housekeeping or managing your Housekeeping? N    Patient Care Team: Marin Olp, MD as PCP - General (Family Medicine) Evans Lance, MD as PCP - Cardiology (Cardiology) Alda Berthold, DO as Consulting Physician (Neurology) Evans Lance, MD as Consulting Physician (Cardiology) Gwynneth Albright (Inactive) as Consulting Physician (Cardiology) [place],  Eden Lathe III, MD as Consulting Physician (Ophthalmology)  Indicate any recent Medical Services you may have received from other than [facility] providers in the past year (date may be approximate).     Assessment:   This is a routine wellness examination for [name].  Hearing/Vision screen Hearing Screening - Comments:: Pt wears hearing aids  Vision Screening - Comments:: Pt follows up with  provider in [place]  Dietary issues and exercise activities discussed: Current Exercise Habits: Home exercise routine, Type of exercise: Other - see comments, Time (Minutes): > 60, Frequency (Times/Week): 4, Weekly Exercise (Minutes/Week): 0   Goals Addressed             This Visit's Progress    Patient Stated       Stay healthy and active        Depression Screen    02/22/2023    2:38 PM 02/19/2022    3:45 PM 09/16/2021   11:47 AM 08/01/2020    2:26 PM 02/15/2020    1:39 PM 04/18/2019    3:07 PM 07/04/2018    1:42 PM  PHQ 2/9 Scores  PHQ - 2 Score 0 0 0 0 0 0 0  PHQ- 9 Score    0       Fall Risk    02/22/2023    2:40 PM 02/19/2022    3:47 PM 09/16/2021   11:47 AM 08/01/2020    2:26 PM 02/15/2020    1:39 PM  [place] in the past year? 0 0 0 0 0  Number falls in past yr: 0 0 0 0 0  Injury with Fall? 0 0 0 0 0  Risk for fall due to : Impaired vision Impaired vision No Fall Risks    Follow up Falls prevention discussed Falls prevention discussed Falls evaluation completed  Falls evaluation completed;Education provided;Falls prevention discussed    FALL RISK PREVENTION PERTAINING TO THE HOME:  Any stairs in or around the home? No  If so, are there any without handrails? No  Home free of loose throw rugs in walkways, pet beds, electrical cords, etc? Yes  Adequate lighting in your home to reduce risk of falls? Yes   ASSISTIVE DEVICES UTILIZED TO PREVENT FALLS:  Life alert? No  Use of a cane, walker or w/c? No  Grab bars in the bathroom? No  Shower chair or bench in shower? Yes  Elevated toilet seat or a handicapped toilet? No   TIMED UP AND GO:  Was the test performed? No .    Cognitive Function:        02/22/2023    2:41 PM 02/19/2022    3:49 PM 02/15/2020    1:38 PM  6CIT Screen  What Year? 0 points 0 points 0 points  What month? 0 points 0 points 0 points  What time? 0 points 0 points 0 points  Count back from 20 0 points 0 points 0 points   Months in reverse 0 points 0 points 0 points  Repeat phrase 2 points 0 points 0 points  Total Score 2 points 0 points 0 points    Immunizations Immunization History  Administered Date(s) Administered   Fluad Quad(high Dose 65+) 08/01/2021, 08/17/2022   Hepatitis A, Adult 08/08/2015, 05/26/2016   Influenza Split 11/09/2011, 09/20/2012, 09/12/2018   Influenza Whole 11/14/2007   Influenza, High Dose Seasonal PF 08/08/2015, 09/02/2017, 08/31/2019, 09/18/2020   Influenza,inj,Quad PF,6+ Mos 09/14/2013, 12/27/2014, 09/12/2018   Influenza-Unspecified 08/22/2019   Moderna Sars-Covid-2 Vaccination  12/26/2019, 02/01/2020   Pneumococcal Conjugate-13 01/30/2015   Pneumococcal Polysaccharide-23 11/09/2011   Td 10/04/2008   Unspecified SARS-COV-2 Vaccination 08/01/2021      Flu Vaccine status: Up to date  Pneumococcal vaccine status: Up to date  Covid-19 vaccine status: Completed vaccines  Qualifies for Shingles Vaccine? Yes   Zostavax completed No   Shingrix Completed?: No.    Education has been provided regarding the importance of this vaccine. Patient has been advised to call insurance company to determine out of pocket expense if they have not yet received this vaccine. Advised may also receive vaccine at local pharmacy or Health Dept. Verbalized acceptance and understanding.  Screening Tests Health Maintenance  Topic Date Due   Zoster Vaccines- Shingrix (1 of 2) Never done   COVID-19 Vaccine (4 - 2023-24 season) 07/31/2022   COLONOSCOPY (Pts 45-79yrs Insurance coverage will need to be confirmed)  04/24/2023   Medicare Annual Wellness (AWV)  02/22/2024   Pneumonia Vaccine 36+ Years old  Completed   INFLUENZA VACCINE  Completed   Hepatitis C Screening  Completed   HPV VACCINES  Aged Out   DTaP/Tdap/Td  Discontinued    Health Maintenance  Health Maintenance Due  Topic Date Due   Zoster Vaccines- Shingrix (1 of 2) Never done   COVID-19 Vaccine (4 - 2023-24 season) 07/31/2022     Colorectal cancer screening: Type of screening: Colonoscopy. Completed 04/23/20. Repeat every 3 years   Additional Screening:  Hepatitis C Screening:  Completed 06/09/17  Vision Screening: Recommended annual ophthalmology exams for early detection of glaucoma and other disorders of the eye. Is the patient up to date with their annual eye exam?  Yes  Who is the provider or what is the name of the office in which the patient attends annual eye exams? [facility] provider  If pt is not established with a provider, would they like to be referred to a provider to establish care? No .   Dental Screening: Recommended annual dental exams for proper oral hygiene  Community Resource Referral / Chronic Care Management: CRR required this visit?  No   CCM required this visit?  No      Plan:     I have personally reviewed and noted the following in the patient's chart:   Medical and social history Use of alcohol, tobacco or illicit drugs  Current medications and supplements including opioid prescriptions. Patient is not currently taking opioid prescriptions. Functional ability and status Nutritional status Physical activity Advanced directives List of other physicians Hospitalizations, surgeries, and ER visits in previous 12 months Vitals Screenings to include cognitive, depression, and falls Referrals and appointments  In addition, I have reviewed and discussed with patient certain preventive protocols, quality metrics, and best practice recommendations. A written personalized care plan for preventive services as well as general preventive health recommendations were provided to patient.     Willette Brace, LPN   [id number]   Nurse Notes: none

## 2023-02-25 DIAGNOSIS — C44729 Squamous cell carcinoma of skin of left lower limb, including hip: Secondary | ICD-10-CM | POA: Diagnosis not present

## 2023-02-25 DIAGNOSIS — L905 Scar conditions and fibrosis of skin: Secondary | ICD-10-CM | POA: Diagnosis not present

## 2023-03-03 ENCOUNTER — Other Ambulatory Visit: Payer: Self-pay

## 2023-03-03 MED ORDER — BENAZEPRIL HCL 5 MG PO TABS: 5.0000 mg | ORAL_TABLET | Freq: Every day | ORAL | 3 refills | Status: DC

## 2023-03-05 ENCOUNTER — Telehealth: Payer: Self-pay | Admitting: Sports Medicine

## 2023-03-05 NOTE — Telephone Encounter (Signed)
Pt called, informed insurance denied his epidural.  Pt wondering if this can be appealed or if there is another plan of care we can recommend as he is really struggling and has a trip planned in early May.  Please call on Monday.

## 2023-03-08 ENCOUNTER — Encounter: Payer: Medicare PPO | Admitting: Family Medicine

## 2023-03-08 ENCOUNTER — Ambulatory Visit (INDEPENDENT_AMBULATORY_CARE_PROVIDER_SITE_OTHER): Payer: Medicare PPO | Admitting: Family Medicine

## 2023-03-08 ENCOUNTER — Encounter: Payer: Self-pay | Admitting: Family Medicine

## 2023-03-08 VITALS — BP 106/70 | HR 75 | Temp 97.1°F | Ht 68.0 in | Wt 166.8 lb

## 2023-03-08 DIAGNOSIS — I700 Atherosclerosis of aorta: Secondary | ICD-10-CM | POA: Diagnosis not present

## 2023-03-08 DIAGNOSIS — I5022 Chronic systolic (congestive) heart failure: Secondary | ICD-10-CM | POA: Diagnosis not present

## 2023-03-08 DIAGNOSIS — I2510 Atherosclerotic heart disease of native coronary artery without angina pectoris: Secondary | ICD-10-CM

## 2023-03-08 DIAGNOSIS — R351 Nocturia: Secondary | ICD-10-CM

## 2023-03-08 DIAGNOSIS — Z0000 Encounter for general adult medical examination without abnormal findings: Secondary | ICD-10-CM

## 2023-03-08 DIAGNOSIS — N401 Enlarged prostate with lower urinary tract symptoms: Secondary | ICD-10-CM | POA: Diagnosis not present

## 2023-03-08 LAB — CBC WITH DIFFERENTIAL/PLATELET
Basophils Absolute: 0 10*3/uL (ref 0.0–0.1)
Basophils Relative: 0.6 % (ref 0.0–3.0)
Eosinophils Absolute: 0.1 10*3/uL (ref 0.0–0.7)
Eosinophils Relative: 1.8 % (ref 0.0–5.0)
HCT: 44.6 % (ref 39.0–52.0)
Hemoglobin: 14.9 g/dL (ref 13.0–17.0)
Lymphocytes Relative: 38.2 % (ref 12.0–46.0)
Lymphs Abs: 2.9 10*3/uL (ref 0.7–4.0)
MCHC: 33.3 g/dL (ref 30.0–36.0)
MCV: 88.9 fl (ref 78.0–100.0)
Monocytes Absolute: 0.7 10*3/uL (ref 0.1–1.0)
Monocytes Relative: 8.9 % (ref 3.0–12.0)
Neutro Abs: 3.9 10*3/uL (ref 1.4–7.7)
Neutrophils Relative %: 50.5 % (ref 43.0–77.0)
Platelets: 308 10*3/uL (ref 150.0–400.0)
RBC: 5.02 Mil/uL (ref 4.22–5.81)
RDW: 14.5 % (ref 11.5–15.5)
WBC: 7.7 10*3/uL (ref 4.0–10.5)

## 2023-03-08 LAB — LIPID PANEL
Cholesterol: 131 mg/dL (ref 0–200)
HDL: 41.1 mg/dL (ref >39.00)
LDL Cholesterol: 53 mg/dL (ref 0–99)
NonHDL: 90.32
Total CHOL/HDL Ratio: 3
Triglycerides: 187 mg/dL — ABNORMAL HIGH (ref 0.0–149.0)
VLDL: 37.4 mg/dL (ref 0.0–40.0)

## 2023-03-08 LAB — COMPREHENSIVE METABOLIC PANEL
ALT: 10 U/L (ref 0–53)
AST: 15 U/L (ref 0–37)
Albumin: 4.5 g/dL (ref 3.5–5.2)
Alkaline Phosphatase: 65 U/L (ref 39–117)
BUN: 22 mg/dL (ref 6–23)
CO2: 26 mEq/L (ref 19–32)
Calcium: 9.5 mg/dL (ref 8.4–10.5)
Chloride: 106 mEq/L (ref 96–112)
Creatinine, Ser: 1.3 mg/dL (ref 0.40–1.50)
GFR: 53.41 mL/min — ABNORMAL LOW (ref >60.00)
Glucose, Bld: 119 mg/dL — ABNORMAL HIGH (ref 70–99)
Potassium: 4.2 mEq/L (ref 3.5–5.1)
Sodium: 141 mEq/L (ref 135–145)
Total Bilirubin: 0.6 mg/dL (ref 0.2–1.2)
Total Protein: 6.8 g/dL (ref 6.0–8.3)

## 2023-03-08 MED ORDER — ZOLPIDEM TARTRATE 5 MG PO TABS: 5.0000 mg | ORAL_TABLET | Freq: Every day | ORAL | 1 refills | Status: DC

## 2023-03-08 NOTE — Patient Instructions (Addendum)
shingrix considering at pharmacy, Tetanus, Diphtheria, and Pertussis (Tdap) recommended  Please stop by lab before you go If you have mychart- we will send your results within 3 business days of [place] receiving them.  If you do not have mychart- we will call you about results within 5 business days of [place] receiving them.  *please also note that you will see labs on mychart as soon as they post. I will later go in and write notes on them- will say "notes from Dr. Durene Cal"    Recommended follow up: Return in about 7 months (around 10/08/2023) for followup or sooner if needed.Schedule b4 you leave.

## 2023-03-08 NOTE — Progress Notes (Signed)
Phone: [phone]   Subjective:  Patient presents today for their annual physical. Chief complaint-noted.   See problem oriented charting- ROS- full  review of systems was completed and negative  except for: bilateral leg weakness, back pain at times, shooting pain into feet  The following were reviewed and entered/updated in epic: Past Medical History:  Diagnosis Date   Abscess    right posterior neck   Anticoagulation adequate 06/07/2019   Overview:  SSS, paroxAFib/Flutter, CHADSvasc= 3 age, h/o cva; chronic OAC Xarelto   Atrial fibrillation and flutter 01/01/2015   Noted 2016. Xarelto    BPH associated with nocturia 08/14/2014   Finasteride, flomax.  0-3x nocturia. PSA to 14-->[facility] PSA trended back down to about 2.6 per records in 2015 then placed on finasteride- had significant voiding issues when spiked to 14- improved on meds    Bradycardia    CAD (coronary artery disease) 05/26/2016   Coronary CTA_ 50-75% D1 off CT report    Cardiac pacemaker in situ 12/28/2009   SA node dysfunction as cause    Chest pressure 12/01/2015   Overview:  atypical CP, [facility] nuclear perfusion imaging w/ no inarct, no ischemia, EF-40% NICM   Chronic systolic heart failure 12/01/2015   Overview:  [facility] nuclear perfusion imaging study, no inarct, no ischemia, EF-40% NICM   CVA (cerebral vascular accident)    Double vision 07/04/2018   Elevated LFTs    Erectile dysfunction 01/30/2015   cialis prn    External hemorrhoids    Fitting or adjustment of cardiac pacemaker 06/07/2019   Overview:  [place] [address] [address] [id number], RA and RV leads [id number]:  [id number]:  [id number] and [id number]   Former smoker 01/30/2015   Former smoker. 20 pack years quit 92. Patient declined AAA screen.     GERD (gastroesophageal reflux disease)    Gout 11/11/2009   Qualifier: Diagnosis of  By: Oswald HillockGraham-Oliver, Karen     History of nuclear stress test 06/07/2019   Overview:  [facility], no inarct, no ischemia, EF-40% NICM   HTN (hypertension)     Hyperglycemia 06/10/2017   110s CBG 2018   Hyperlipidemia 07/12/2007   Crestor 20mg  daily with some myalgias; fish oil. Trig 200-500 despite this. LDL ok.        Insomnia 01/30/2015   ambien 5mg  prn. May refill.     Ischemic optic neuropathy    Left chest pressure 03/19/2015   Other and unspecified hyperlipidemia    Sinoatrial node dysfunction    Spinal stenosis    Trigger finger, right middle finger 03/01/2018   Patient Active Problem List   Diagnosis Date Noted   Paroxysmal atrial fibrillation 07/27/2019    Priority: High   S/P cardiac cath 06/07/2019    Priority: High   CAD (coronary artery disease) 05/26/2016    Priority: High   Nonischemic cardiomyopathy 04/23/2016    Priority: High   Chronic systolic heart failure 12/01/2015    Priority: High   CVA (cerebral infarction) 01/30/2015    Priority: High   Atrial fibrillation and flutter (HCC) 01/01/2015    Priority: High   Ventricular tachycardia, non-sustained 01/25/2013    Priority: High   Cardiac pacemaker in situ 12/28/2009    Priority: High   Aortic atherosclerosis 08/01/2020    Priority: Medium    Fatty liver 08/01/2020    Priority: Medium    Double vision 07/04/2018    Priority: Medium    Hyperglycemia 06/10/2017    Priority: Medium    Insomnia 01/30/2015  Priority: Medium    BPH associated with nocturia 08/14/2014    Priority: Medium    Gout 11/11/2009    Priority: Medium    Hyperlipidemia 07/12/2007    Priority: Medium    NEUROPATHY, ISCHEMIC OPTIC 06/02/2007    Priority: Medium    Essential hypertension 06/02/2007    Priority: Medium    Anticoagulation adequate 06/07/2019    Priority: Low   Convergence insufficiency 06/07/2019    Priority: Low   Fitting or adjustment of cardiac pacemaker 06/07/2019    Priority: Low   Pacemaker-dependent due to native cardiac rhythm insufficient to support life 06/07/2019    Priority: Low   Palpitations 06/07/2019    Priority: Low   History of nuclear stress test  06/07/2019    Priority: Low   Non-occlusive coronary artery disease 06/07/2019    Priority: Low   Trigger finger, right middle finger 03/01/2018    Priority: Low   Solitary pulmonary nodule 05/26/2016    Priority: Low   Chest pressure 12/01/2015    Priority: Low   Left chest pressure 03/19/2015    Priority: Low   Erectile dysfunction 01/30/2015    Priority: Low   Former smoker 01/30/2015    Priority: Low   Allergic rhinitis 10/11/2014    Priority: Low   Sinoatrial node dysfunction     Priority: Low   SPINAL STENOSIS, LUMBAR 08/22/2010    Priority: Low   GERD 03/17/2010    Priority: Low   Past Surgical History:  Procedure Laterality Date   ABSCESS DRAINAGE     righ tposterior neck   CARDIAC CATHETERIZATION  1996   CHOLECYSTECTOMY  02/07/2003   COLONOSCOPY  06/23/2004   EYE SURGERY     left eye   left ingunial hernia  06/08/2011   PACEMAKER INSERTION     right hip replacement     2012 Dr. Despina Hick    Family History  Problem Relation Age of Onset   Lung cancer Mother        mets to brain   Heart attack Mother 29   Hypertension Mother    Brain cancer Mother    Heart attack Father        mid 19s, smoker.    Diabetes Father    Cancer Brother    Diabetes Paternal Grandfather    Healthy Brother    Colon cancer Neg Hx    Esophageal cancer Neg Hx    Stomach cancer Neg Hx    Rectal cancer Neg Hx     Medications- reviewed and updated Current Outpatient Medications  Medication Sig Dispense Refill   benazepril (LOTENSIN) 5 MG tablet Take 1 tablet (5 mg total) by mouth daily. 90 tablet 3   finasteride (PROSCAR) 5 MG tablet Take 1 tablet by mouth once daily 90 tablet 0   gemfibrozil (LOPID) 600 MG tablet TAKE 1 TABLET BY MOUTH TWICE DAILY BEFORE  A  MEAL 180 tablet 3   icosapent Ethyl (VASCEPA) 1 g capsule Take 1 g by mouth 2 (two) times daily.     metoprolol succinate (TOPROL-XL) 25 MG 24 hr tablet Take 1 tablet (25 mg total) by mouth daily. 90 tablet 3   Multiple  Vitamin (MULTIVITAMIN) tablet Take 1 tablet by mouth daily.     omeprazole (PRILOSEC) 20 MG capsule Take 1 capsule by mouth once daily 90 capsule 0   rosuvastatin (CRESTOR) 40 MG tablet Take 1 tablet by mouth once daily 90 tablet 0   tadalafil (CIALIS) 5 MG  tablet TAKE ONE TABLET BY MOUTH DAILY AS NEEDED FOR ERECTILE DYSFUNCTION 90 tablet 3   tamsulosin (FLOMAX) 0.4 MG CAPS capsule Take 1 capsule by mouth once daily 90 capsule 0   XARELTO 20 MG TABS tablet TAKE 1 TABLET BY MOUTH ONCE DAILY WITH SUPPER 90 tablet 2   cyclobenzaprine (FLEXERIL) 5 MG tablet Take 1 tablet (5 mg total) by mouth 3 (three) times daily as needed for muscle spasms. (Patient not taking: Reported on 03/08/2023) 30 tablet 2   indomethacin (INDOCIN) 25 MG capsule TAKE 1 CAPSULE BY MOUTH TWICE DAILY WITH A MEAL AS NEEDED FOR PAIN. 3  DAYS  MAXIMUM (Patient not taking: Reported on 03/08/2023) 30 capsule 0   zolpidem (AMBIEN) 5 MG tablet Take 1 tablet (5 mg total) by mouth at bedtime. 90 tablet 1   No current facility-administered medications for this visit.    Allergies-reviewed and updated No Known Allergies  Social History   Social History Narrative   Married. 1 child and 1 step child. 2 grandchildren.       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 Oropharynx normal Neck: no thyromegaly CV: RRR no murmurs rubs or gallops Lungs: CTAB no crackles, wheeze, rhonchi Abdomen: soft/nontender/nondistended/normal bowel sounds. No rebound or guarding.  Ext: no edema Skin: warm, dry Neuro: grossly normal, moves all extremities, PERRLA   Assessment and Plan  77 y.o. male presenting for annual physical.  Health Maintenance  counseling: 1. Anticipatory guidance: Patient counseled regarding regular dental exams -q6 months- more like yearly, eye exams -seen recently,  avoiding smoking and second hand smoke , limiting alcohol to 2 beverages per day , no illicit drugs .   2. Risk factor reduction:  Advised patient of need for regular exercise and diet rich and fruits and vegetables to reduce risk of heart attack and stroke.  Exercise- trying to walk- has issues with spinal stenosis- definitely swimming still  Diet/weight management-stable over last year- reasonably healthy weight for age and sex.  Wt Readings from Last 3 Encounters:  03/08/23 166 lb 12.8 oz (75.7 kg)  02/22/23 168 lb (76.2 kg)  02/12/23 168 lb 3.2 oz (76.3 kg)  3. Immunizations/screenings/ancillary studies- shingrix considering at pharmacy, Tetanus, Diphtheria, and Pertussis (Tdap) at pharmacy Immunization History  Administered Date(s) Administered   Covid-19, Mrna,Vaccine(Spikevax)1yrs and older 08/17/2022   Fluad Quad(high Dose 65+) 08/01/2021, 08/17/2022   Hepatitis A, Adult 08/08/2015, 05/26/2016   Influenza Split 11/09/2011, 09/20/2012, 09/12/2018   Influenza Whole 11/14/2007   Influenza, High Dose Seasonal PF 08/08/2015, 09/02/2017, 08/31/2019, 09/18/2020   Influenza,inj,Quad PF,6+ Mos 09/14/2013, 12/27/2014, 09/12/2018   Influenza-Unspecified 08/22/2019   Moderna Sars-Covid-2 Vaccination 12/26/2019, 02/01/2020   Pneumococcal Conjugate-13 01/30/2015   Pneumococcal Polysaccharide-23 11/09/2011   Td 10/04/2008  4. Prostate cancer screening- see BPH section below  Lab Results  Component Value Date   PSA 1.19 03/09/2022   PSA 1.71 10/07/2021   PSA 2.39 09/16/2021   5. Colon cancer screening - 04/23/20 with 3 year repeat 6. Skin cancer screening- regular visits. advised regular sunscreen use. Denies worrisome, changing, or  new skin lesions.  7. Smoking associated screening (lung cancer screening, AAA screen 65-75, UA)- former smoker- no  aortic aneurysm on CT 03/2019 8. STD screening - only active with [facility]  Status of chronic or acute concerns   #CV issues 1.  Coronary artery disease-compliant with Xarelto-not on aspirin as a result of Xarelto. No chest pain or shortness of breath - deep breaths help 2.  Hyperlipidemia-compliant with rosuvastatin 40 mg with LDL goal 70 or less.  Also takes gemfibrozil and vascepa 3.  Nonischemic cardiomyopathy/systolic heart failure -patient follows up with [facility].  Compliant with benazepril 5 mg metoprolol 25 mg extended release.  Has not required Lasix  4. sinus node dysfunction with pacemaker in place 5.  Atrial fibrillation and flutter-on chronic Xarelto and metoprolol 6.  Aortic atherosclerosis-on statin and Xarelto Lab Results  Component Value Date   CHOL 148 03/09/2022   HDL 43.30 03/09/2022   LDLCALC 65 03/09/2022   LDLDIRECT 70.0 11/16/2019   TRIG 198.0 (H) 03/09/2022   CHOLHDL 3 03/09/2022  A/P: cardiovascular issues overall stable- high risk has had more tachycardia than usual (blood pressure runs lower so hard to push metoprolol higher) - he has follow up with Dr. Ladona Ridgel- otherwise issues stbale- continue current medications- update lipid panel today with other labs CBC, CMP. CAD asymptomatic. CHF stable- continue current medicines  #Insomnia S: Medication: Ambien 5 mg as needed-  Sleep still imperfect even with medication but Has 2 different generics- one not effective and other is. Denies falls/abnormal dreams/sleepwalking/trouble driving next day  A/P: stable- continue current medicines - refill today for sparing use    #BPH S: Patient compliant with finasteride 5 mg.  Previously on Flomax -now back to seeing urology- PSA trended up some- they are planning on repeating PSA in coming months Lab Results  Component Value Date   PSA 1.19 03/09/2022   PSA 1.71 10/07/2021   PSA 2.39 09/16/2021  A/P: BPH - hold off on PSA- will be checked with urology in about 5 months. Also  had cystoscopy with microscopic hematuria.      # Hyperglycemia/insulin resistance/prediabetes-peak A1c 5.9 S:  Medication: none Lab Results  Component Value Date   HGBA1C 6.0 11/17/2022   HGBA1C 6.1 03/09/2022   HGBA1C 5.6 09/16/2021  A/P: too soon for repeat- recheck next isit   #hypertension S: medication: Metoprolol 25 mg extended release, benazepril 5 mg BP Readings from Last 3 Encounters:  03/08/23 106/70  02/12/23 116/77  01/28/23 122/80  A/P: stable but low acceptable- continue current medicines    #Gout-not on uric acid lowering agent- denies gout flares    #Spinal stenosis (prior leg weakness)- had upcoming epidural but denied by insurance. He is taking tylenol 1000mg  every 6 hours - total 3 x per 24 hours. Pain and discomfort has improved over last month. Still gets weakness sensation in his legs. Prolonged sitting with shooting pain  -also has known neuropathy  Recommended follow up: Return in about 7 months (around 10/08/2023) for followup or sooner if needed.Schedule b4 you leave. Future Appointments  Date Time Provider Department Center  05/14/2023 11:30 AM Marinus Maw, MD [facility]  02/28/2024  3:30 PM [facility] ANNUAL WELLNESS VISIT 1 [facility]   Lab/Order associations: fasting   ICD-10-CM   1. Preventative health care  Z00.00     2. Chronic systolic heart failure Chronic I50.22     3. Aortic atherosclerosis Chronic I70.0     4. Coronary artery disease involving native coronary artery  of native heart without angina pectoris  I25.10 CBC with Differential/Platelet    Comprehensive metabolic panel    Lipid panel    5. BPH associated with nocturia  N40.1    R35.1       Meds ordered this encounter  Medications   zolpidem (AMBIEN) 5 MG tablet    Sig: Take 1 tablet (5 mg total) by mouth at bedtime.    Dispense:  90 tablet    Refill:  1    Return precautions advised.  Tana Conch, MD

## 2023-03-08 NOTE — Telephone Encounter (Signed)
Patient called stating that his insurance called him yesterday and said that his appeal was approved.  I advised him to contact [facility] to schedule.

## 2023-03-10 NOTE — Telephone Encounter (Signed)
Epidural scheduled for 03/12/2023.

## 2023-03-12 ENCOUNTER — Ambulatory Visit
Admission: RE | Admit: 2023-03-12 | Discharge: 2023-03-12 | Disposition: A | Discharge status: Home / Self Care | Payer: Medicare PPO | Source: Ambulatory Visit | Attending: Sports Medicine | Admitting: Sports Medicine

## 2023-03-12 DIAGNOSIS — M5136 Other intervertebral disc degeneration, lumbar region: Secondary | ICD-10-CM

## 2023-03-12 DIAGNOSIS — M47816 Spondylosis without myelopathy or radiculopathy, lumbar region: Secondary | ICD-10-CM

## 2023-03-12 DIAGNOSIS — M47817 Spondylosis without myelopathy or radiculopathy, lumbosacral region: Secondary | ICD-10-CM | POA: Diagnosis not present

## 2023-03-12 DIAGNOSIS — M48061 Spinal stenosis, lumbar region without neurogenic claudication: Secondary | ICD-10-CM | POA: Diagnosis not present

## 2023-03-12 DIAGNOSIS — G8929 Other chronic pain: Secondary | ICD-10-CM

## 2023-03-12 MED ORDER — IOPAMIDOL (ISOVUE-M 200) INJECTION 41%
1.0000 mL | Freq: Once | INTRAMUSCULAR | Status: AC
  Administered 2023-03-12: 1 mL via EPIDURAL

## 2023-03-12 MED ORDER — METHYLPREDNISOLONE ACETATE 40 MG/ML INJ SUSP (RADIOLOG
80.0000 mg | Freq: Once | INTRAMUSCULAR | Status: AC
  Administered 2023-03-12: 80 mg via EPIDURAL

## 2023-03-12 NOTE — Discharge Instructions (Signed)

## 2023-03-19 ENCOUNTER — Telehealth: Payer: Self-pay

## 2023-03-19 ENCOUNTER — Encounter: Payer: Self-pay | Admitting: Internal Medicine

## 2023-03-19 NOTE — Telephone Encounter (Signed)
Message received from [facility] Triage regarding Pt having fluttering feeling in chest, and dyspnea on exertion.    Voicemail message left on home telephone number and personal cell number, to investigate symptoms, and possibly advise visit to ER?  Waiting on Pt to call me back.  Follow up is required.

## 2023-03-19 NOTE — Telephone Encounter (Signed)
Messages sent via MyChart:  Dear Dr. Ladona Ridgel, For the past 20+ years, my resting heart rate has been in the 60s.  Now, for the last three months, it has hovered in the mid-90s even while I am lying in bed overnight. Is the a problem? Please let me know, [name]  Other than feeing "fluttering", the only other issue is needing to take deep breaths(feeling out of breath) upon little or no exertion such as standing in the shower a few minutes ago.      Spoke with the patient, he has experienced his heart fluttering for the past 2-3 months. Also during this time frame at night while pt is  lying in bed his HR is 90-100. He mentioned this is a significant change.Has shortness of breath at times. Pt stated he does have PVC's and this does hinder him at time form working out. He is currently taking metoprolol 25 mg and Xarelto 20 mg daily. Last week while at the PCP office his bp was 110/70. Explained ED precautions, pt voiced understanding. Will forward to MD and nurse for advise.

## 2023-03-19 NOTE — Telephone Encounter (Signed)
Pt called back regarding voicemail message received.    Pt has a BIO PPM that is not followed by our [facility] device team.  NOTE: Pt had a follow up appointment scheduled with Dr. Ladona Ridgel on 05/14/23.  Pt stated his HR is normally 60-65 bpm.  For the past 2-3 months, his HR has had fluctuations, between 60 to almost 100 bpm.  At night prior to sleeping his HR has been in the 90's?  Pt exercises and swims, and stated he became short of breath after showering.  He was not doing any physical activity.  Pt feels sometimes he has to take deep breaths, but this is not all the time.  Lastly Pt stated his watch reported AFIB at 14% .    Pt wants 1. Sooner visit the Dr. Ladona Ridgel than 6/14;  Would prefer to see Dr. Ladona Ridgel, but willing to see Mr. Tillery PA-C or Ms Collene Schlichter.    2.  Pt wants to be seen next week if possible. I will send a message to scheduling.  Pt states he will be out of town the week after.    Pt educated on when to call [facility] with new or worsening symptoms, and when to go to the ER for providers care.  Pt verbalized understanding.

## 2023-03-26 ENCOUNTER — Ambulatory Visit: Payer: Medicare PPO | Attending: Physician Assistant | Admitting: Physician Assistant

## 2023-03-26 ENCOUNTER — Encounter: Payer: Self-pay | Admitting: Physician Assistant

## 2023-03-26 ENCOUNTER — Ambulatory Visit: Payer: Medicare PPO

## 2023-03-26 VITALS — BP 112/76 | HR 82 | Ht 68.0 in | Wt 164.2 lb

## 2023-03-26 DIAGNOSIS — Z950 Presence of cardiac pacemaker: Secondary | ICD-10-CM | POA: Diagnosis not present

## 2023-03-26 DIAGNOSIS — R42 Dizziness and giddiness: Secondary | ICD-10-CM | POA: Diagnosis not present

## 2023-03-26 DIAGNOSIS — D6869 Other thrombophilia: Secondary | ICD-10-CM

## 2023-03-26 DIAGNOSIS — R0602 Shortness of breath: Secondary | ICD-10-CM

## 2023-03-26 DIAGNOSIS — R002 Palpitations: Secondary | ICD-10-CM

## 2023-03-26 DIAGNOSIS — Z79899 Other long term (current) drug therapy: Secondary | ICD-10-CM

## 2023-03-26 DIAGNOSIS — I10 Essential (primary) hypertension: Secondary | ICD-10-CM

## 2023-03-26 DIAGNOSIS — I480 Paroxysmal atrial fibrillation: Secondary | ICD-10-CM | POA: Diagnosis not present

## 2023-03-26 LAB — CUP PACEART INCLINIC DEVICE CHECK
Date Time Interrogation Session: 20240426172008
Implantable Lead Connection Status: 753985
Implantable Lead Connection Status: 753985
Implantable Lead Implant Date: 20031017
Implantable Lead Implant Date: 20031017
Implantable Lead Location: 753859
Implantable Lead Location: 753860
Implantable Lead Model: 5076
Implantable Lead Model: 5076
Implantable Pulse Generator Implant Date: 20120210
Lead Channel Pacing Threshold Amplitude: 0.7 V
Lead Channel Pacing Threshold Amplitude: 1.3 V
Lead Channel Pacing Threshold Pulse Width: 0.4 ms
Lead Channel Pacing Threshold Pulse Width: 0.4 ms
Lead Channel Sensing Intrinsic Amplitude: 11.7 mV
Lead Channel Sensing Intrinsic Amplitude: 4.9 mV
Pulse Gen Serial Number: 66063261

## 2023-03-26 NOTE — Progress Notes (Unsigned)
ZIO XT serial # [id number] from office inventory applied to patient.  Dr. Ladona Ridgel to read.

## 2023-03-26 NOTE — Patient Instructions (Addendum)
Medication Instructions:   Your physician recommends that you continue on your current medications as directed. Please refer to the Current Medication list given to you today.   *If you need a refill on your cardiac medications before your next appointment, please call your pharmacy*   Lab Work: BMET AND CBC TODAY    If you have labs (blood work) drawn today and your tests are completely normal, you will receive your results only by: MyChart Message (if you have MyChart) OR A paper copy in the mail If you have any lab test that is abnormal or we need to change your treatment, we will call you to review the results.   Testing/Procedures: Your physician has recommended that you wear an event monitor. Event monitors are medical devices that record the heart's electrical activity. Doctors most often [place] these monitors to diagnose arrhythmias. Arrhythmias are problems with the speed or rhythm of the heartbeat. The monitor is a small, portable device. You can wear one while you do your normal daily activities. This is usually used to diagnose what is causing palpitations/syncope (passing out).     Follow-Up: At [facility], you and your health needs are our priority.  As part of our continuing mission to provide you with exceptional heart care, we have created designated Provider Care Teams.  These Care Teams include your primary Cardiologist (physician) and Advanced Practice Providers (APPs -  Physician Assistants and Nurse Practitioners) who all work together to provide you with the care you need, when you need it.  We recommend signing up for the patient portal called "MyChart".  Sign up information is provided on this After Visit Summary.  MyChart is used to connect with patients for Virtual Visits (Telemedicine).  Patients are able to view lab/test results, encounter notes, upcoming appointments, etc.  Non-urgent messages can be sent to your provider as well.   To learn more about  what you can do with MyChart, go to [email or web address].    Your next appointment:  AS SCHEDULED     Provider:   [name], MD    Other Instructions   [name]- Long Term Monitor Instructions  Your physician has requested you wear a ZIO patch monitor for 3 days.  This is a single patch monitor. [employer] supplies one patch monitor per enrollment. Additional stickers are not available. Please do not apply patch if you will be having a Nuclear Stress Test,  Echocardiogram, Cardiac CT, MRI, or Chest Xray during the period you would be wearing the  monitor. The patch cannot be worn during these tests. You cannot remove and re-apply the  ZIO XT patch monitor.  Your ZIO patch monitor will be mailed 3 day USPS to your address on file. It may take 3-5 days  to receive your monitor after you have been enrolled.  Once you have received your monitor, please review the enclosed instructions. Your monitor  has already been registered assigning a specific monitor serial # to you.  Billing and Patient Assistance Program Information  We have supplied [employer] with any of your insurance information on file for billing purposes. [employer] offers a sliding scale Patient Assistance Program for patients that do not have  insurance, or whose insurance does not completely cover the cost of the ZIO monitor.  You must apply for the Patient Assistance Program to qualify for this discounted rate.  To apply, please call [employer] at [phone], select option 4, select option 2, ask to apply for  Patient Assistance  Program. [name] will ask your household income, and how many people  are in your household. They will quote your out-of-pocket cost based on that information.  [employer] will also be able to set up a 35-month, interest-free payment plan if needed.  Applying the monitor   Shave hair from upper left chest.  Hold abrader disc by orange tab. Rub abrader in 40 strokes over the upper left chest as   indicated in your monitor instructions.  Clean area with 4 enclosed alcohol pads. Let dry.  Apply patch as indicated in monitor instructions. Patch will be placed under collarbone on left  side of chest with arrow pointing upward.  Rub patch adhesive wings for 2 minutes. Remove white label marked "1". Remove the white  label marked "2". Rub patch adhesive wings for 2 additional minutes.  While looking in a mirror, press and release button in center of patch. A small green light will  flash 3-4 times. This will be your only indicator that the monitor has been turned on.  Do not shower for the first 24 hours. You may shower after the first 24 hours.  Press the button if you feel a symptom. You will hear a small click. Record Date, Time and  Symptom in the Patient Logbook.  When you are ready to remove the patch, follow instructions on the last 2 pages of Patient  Logbook. Stick patch monitor onto the last page of Patient Logbook.  Place Patient Logbook in the blue and white box. Use locking tab on box and tape box closed  securely. The blue and white box has prepaid postage on it. Please place it in the mailbox as  soon as possible. Your physician should have your test results approximately 7 days after the  monitor has been mailed back to [facility].  Call [employer] Customer Care at [phone] if you have questions regarding  your ZIO XT patch monitor. Call them immediately if you see an orange light blinking on your  monitor.  If your monitor falls off in less than 4 days, contact our Monitor department at [phone].  If your monitor becomes loose or falls off after 4 days call [employer] at [phone] for  suggestions on securing your monitor

## 2023-03-26 NOTE — Progress Notes (Signed)
Cardiology Office Note Date:  03/26/2023  Patient ID:  [name] [birth year], MRN [id number] PCP:  Shelva Majestic, MD  Cardiologist: D. Vemulapalli Electrophysiologist: Dr. Ladona Ridgel    Chief Complaint:   palpitations, elevated HRs  History of Present Illness: [name] is a 77 y.o. male with history of HTN, HLD, AFib, stroke (post cath), SND w/PPM, CAD, , VHD (bicuspid AV), AFib/flutter, Chronic CHF (systolic (recovered EF), NICM  He saw Dr. Oley Balm 06/29/22, continues to swim, though less with more vacationing, report that he gets occasional episodes where he feels like his heart rate drops when he swims and he is not able to exert himself as much until it comes back to normal a few minutes later. He is following with electrophysiology at [facility] and has been having interrogations of his pacemaker.  Discussed CT scan report with perhaps exception of the 75% D1. Size of D1 not clear, and given prior CVA with LHC high threshold for intervention: plan to pursue aggressive risk factor management. Prior cMRI showed an infarct in the diag distribution but no significant peri-infarct ischemia or ischemia in the RCA or LCX territories. He has mild CP while walking, but no CP with more rigorous exercise, which argues against ischemic etiology of his sx. Lipid panel much improved since starting the icosopent ethyl.  No changes were made.   He saw Dr. Ladona Ridgel 11/17/22, doing well, swimming, noted PVCs, good exercise tolerance and chronotropic response.  NO changes were made.  Needs clearance for epidural RCRI score is 3 (for some degree of CAD and prior stroke), 11% risk  I saw him 02/12/23 In the last couple weeks an observation of elevated HRs unusually 90's-110's at rest, not sustained, perhaps associated with a bit of jittery feeling. Doesn't feel like he is sleeping well. He has his back pain, actually better the last couple days, but continues to swim No CP No  SOB No near syncope or syncope. Does say that when at peak exercise in the pool he does tend to hit a wall with a sudden fall in HR, has to stop, rest on the lane divider. He says Dr. Ladona Ridgel has felt that was his PVCs. His EKGs #1 comes in appears an ATach/ectopic atrial rhythm/V paced rhythm 96bpm #2 is AP/Vsensed 60bpm, diffuse T changes, Initially wondered if he was reaching a block rate based on his c/o with swimming but his HR histograms did not suggest that His watch data shows him getting to the 140's  Discussed increasing Toprol, he wanted to hold off Felt he was reasonable cardiac candidate for procedure planned Recommended a 3 mo visit  Phone calls with pt observation of his resting HR 90, much different then the 60's he is accompanied to seeing, fluttering  TODAY He reports the same exertional limitations, swims regularly and still getting to that wall that he has to stop but not every time he swims. He will when walking with his wife sometimes have to turn back early because he feels "OFF"   Doesn't seem to be walking at a very brisk pace, mentions his wife has to stop fairly frequently He is much more aware in the last few months and especially of late that his resting heart rate is 90's-100. This he is quite uncomfortable with, seems mostly by his watch observations, not particularly symptomatic But it the change for sure is worrisome to him.  NO CP No palpitations No near syncope or syncope  Device information Biotronik dual chamber PPM implanted 09/15/2002, gen change 01/09/2011 (Has MDT leads)   Past Medical History:  Diagnosis Date   Abscess    right posterior neck   Anticoagulation adequate 06/07/2019   Overview:  SSS, paroxAFib/Flutter, CHADSvasc= 3 age, h/o cva; chronic OAC Xarelto   Atrial fibrillation and flutter (HCC) 01/01/2015   Noted 2016. Xarelto    BPH associated with nocturia 08/14/2014   Finasteride, flomax.  0-3x nocturia. PSA to 14-->eval urology  [facility]. PSA trended back down to about 2.6 per records in 2015 then placed on finasteride- had significant voiding issues when spiked to 14- improved on meds    Bradycardia    CAD (coronary artery disease) 05/26/2016   Coronary CTA_ 50-75% D1 off CT report    Cardiac pacemaker in situ 12/28/2009   SA node dysfunction as cause    Chest pressure 12/01/2015   Overview:  atypical CP, [facility] nuclear perfusion imaging w/ no inarct, no ischemia, EF-40% NICM   Chronic systolic heart failure (HCC) 12/01/2015   Overview:  [facility] nuclear perfusion imaging study, no inarct, no ischemia, EF-40% NICM   CVA (cerebral vascular accident) (HCC)    Double vision 07/04/2018   Elevated LFTs    Erectile dysfunction 01/30/2015   cialis prn    External hemorrhoids    Fitting or adjustment of cardiac pacemaker 06/07/2019   Overview:  Biotronik [place] DR [id number], RA and RV leads [id number]:  [id number] and [id number]   Former smoker 01/30/2015   Former smoker. 20 pack years quit 92. Patient declined AAA screen.     GERD (gastroesophageal reflux disease)    Gout 11/11/2009   Qualifier: Diagnosis of  By: Oswald Hillock     History of nuclear stress test 06/07/2019   Overview:  [facility], no inarct, no ischemia, EF-40% NICM   HTN (hypertension)    Hyperglycemia 06/10/2017   110s CBG 2018   Hyperlipidemia 07/12/2007   Crestor 20mg  daily with some myalgias; fish oil. Trig 200-500 despite this. LDL ok.        Insomnia 01/30/2015   ambien 5mg  prn. May refill.     Ischemic optic neuropathy    Left chest pressure 03/19/2015   Other and unspecified hyperlipidemia    Sinoatrial node dysfunction (HCC)    Spinal stenosis    Trigger finger, right middle finger 03/01/2018    Past Surgical History:  Procedure Laterality Date   ABSCESS DRAINAGE     righ tposterior neck   CARDIAC CATHETERIZATION  1996   CHOLECYSTECTOMY  02/07/2003   COLONOSCOPY  06/23/2004   EYE SURGERY     left eye   left ingunial hernia  06/08/2011   PACEMAKER INSERTION      right hip replacement     2012 Dr. Despina Hick    Current Outpatient Medications  Medication Sig Dispense Refill   benazepril (LOTENSIN) 5 MG tablet Take 1 tablet (5 mg total) by mouth daily. 90 tablet 3   cyclobenzaprine (FLEXERIL) 5 MG tablet Take 1 tablet (5 mg total) by mouth 3 (three) times daily as needed for muscle spasms. (Patient not taking: Reported on 03/08/2023) 30 tablet 2   finasteride (PROSCAR) 5 MG tablet Take 1 tablet by mouth once daily 90 tablet 0   gemfibrozil (LOPID) 600 MG tablet TAKE 1 TABLET BY MOUTH TWICE DAILY BEFORE  A  MEAL 180 tablet 3   icosapent Ethyl (VASCEPA) 1 g capsule Take 1 g by mouth 2 (two) times daily.     indomethacin (  INDOCIN) 25 MG capsule TAKE 1 CAPSULE BY MOUTH TWICE DAILY WITH A MEAL AS NEEDED FOR PAIN. 3  DAYS  MAXIMUM (Patient not taking: Reported on 03/08/2023) 30 capsule 0   metoprolol succinate (TOPROL-XL) 25 MG 24 hr tablet Take 1 tablet (25 mg total) by mouth daily. 90 tablet 3   Multiple Vitamin (MULTIVITAMIN) tablet Take 1 tablet by mouth daily.     omeprazole (PRILOSEC) 20 MG capsule Take 1 capsule by mouth once daily 90 capsule 0   rosuvastatin (CRESTOR) 40 MG tablet Take 1 tablet by mouth once daily 90 tablet 0   tadalafil (CIALIS) 5 MG tablet TAKE ONE TABLET BY MOUTH DAILY AS NEEDED FOR ERECTILE DYSFUNCTION 90 tablet 3   tamsulosin (FLOMAX) 0.4 MG CAPS capsule Take 1 capsule by mouth once daily 90 capsule 0   XARELTO 20 MG TABS tablet TAKE 1 TABLET BY MOUTH ONCE DAILY WITH SUPPER 90 tablet 2   zolpidem (AMBIEN) 5 MG tablet Take 1 tablet (5 mg total) by mouth at bedtime. 90 tablet 1   No current facility-administered medications for this visit.    Allergies:   Patient has no known allergies.   Social History:  The patient  reports that he quit smoking about 31 years ago. His smoking use included cigarettes. He has a 20.00 pack-year smoking history. He has never used smokeless tobacco. He reports current alcohol use. He reports that he does  not use drugs.   Family History:  The patient's family history includes Brain cancer in his mother; Cancer in his brother; Diabetes in his father and paternal grandfather; Healthy in his brother; Heart attack in his father; Heart attack (age of onset: 22) in his mother; Hypertension in his mother; Lung cancer in his mother.  ROS:  Please see the history of present illness.    All other systems are reviewed and otherwise negative.   PHYSICAL EXAM:  VS:  There were no vitals taken for this visit. BMI: There is no height or weight on file to calculate BMI. Well nourished, well developed, in no acute distress HEENT: normocephalic, atraumatic Neck: no JVD, carotid bruits or masses Cardiac: RRR; no significant murmurs, no rubs, or gallops Lungs: CTA b/l, no wheezing, rhonchi or rales Abd: soft, nontender MS: no deformity or atrophy Ext: no edema Skin: warm and dry, no rash Neuro:  No gross deficits appreciated Psych: euthymic mood, full affect  PPM site is stable, no tethering or discomfort   EKG:  done today and reviewed by myself AV paced 72bpm  Device interrogation done today and reviewed by myself:  I have asked industry to help troubleshoot his device today noting his last visit. Battery and lead measurements are good He does have retrograde conduction and is believed that his AS/VP EKG from last visit was in fact PMT despite the slower rate His retrograde conduction is 324-363, with faster V pacing retrograde slows/seems to wenckebach Today he is dependent at 40 VP 82%  AP 48% Given already his VP% and at least today is dependent  With baseline programming his 2:1 block rate is 109 and histograms do reflect abrupt drops in HRs c/w this and his symptoms  In d/w industry And in d/w patient He comes today mostly 2/2 new faster resting rates We opted to  Fix AV delay at 180 and push out PVARP to 370 This should cover retrograde and keeps his block rate  unchanged    09/04/21: cardiac MRI Comparison with prior stress cMRI (07/13/2016).  1. The left ventricle is normal in cavity size. There is mild basal sigmoid hypertrophy (max: 1.3 cm). The  basal anteroseptum is focally hypokinetic. The LVEF is preserved at 60% (prior 50%).  2. The right ventricle is normal in cavity size, wall thickness, and systolic function. A pacemaker lead is  seen traversing the tricuspid valve and terminating in the apical septum.  4. Both atria are normal in size. A pacemaker lead is seen in the right atrium, terminating in the right  atrial appendage.  4. The aortic valve is bicommissural in morphology.  There is a raphe between the non and right coronary  cusps. The leaflets are thickened.  There is mild aortic stenosis.  Peak aortic velocity measures 2.4 m/s  (prior 1.7 m/s) with peak/mean gradients of 23/10 mm Hg (prior mean gradient 6 mm Hg). Aortic valve area  measures 1.4 cm2 by planimetry.  There is mild aortic regurgitation.  5. There is mild thickening of the mitral valve leaflets. There is no significant mitral stenosis and mild  mitral regurgitation.  6. There is at least mild tricuspid regurgitation. Complete evaluation is limited by metal artifact.  7. There is mild pulmonic regurgitation.  8. Delayed enhancement MRI is abnormal.  There is hyperenhancement in the basal anteroseptum consistent  with a subendocardial myocardial infarction in the distribution of the LAD (~3% total LV myocardial mass).  The majority of the LAD perfusion territory is viable.  All other segments are fully viable.  9. There are no intracardiac thrombi.   There has been no significant change compared with prior cardiac MRI.   Chest MRA:   1. The aortic root is normal in size. The ascending aorta, arch, and descending aorta are normal in  diameter. There is no dissection seen. 2. The aortic arch is left sided. There is normal branching of the arch vessels.  3. The main  and proximal branch pulmonary arteries are normal in size.  4. Normal systemic venous connections.  5.  There is no evidence of an intracardiac thrombus.  6. The pericardium is normal in thickness.  There is no pericardial effusion.    Bi-orthogonal luminal aortic dimensions are listed below:   Sinus of Valsalva (largest dimension, cross-sectional dimension): 3.4 x 2.7 cm  Sinotubular junction: 2.8 x 2.3 cm  Mid-ascending aorta: 3.4 x 3.3 cm  Proximal aortic arch (aorta at the origin of the innominate artery):3.2 x 3.2 cm  Mid-aortic arch (between left common carotid and subclavian arteries): 2.8 x 2.7 cm  Proximal descending thoracic aorta (begins at the isthmus, approximately 2 cm distal to the left subclavian  artery): 2.4 x 2.3 cm  Mid-descending aorta: 2.4 x 2.3 cm  Aorta at diaphragm: 2.3 x 2.2 cm     04/29/21: EST Blood pressure demonstrated a normal response to exercise. There was no ST segment deviation noted during stress.   04/13/2018: TTE INTERPRETATION  NORMAL LV SYSTOLIC FUNCTION WITH MILD DIASTOLIC DYSFUNCTION  MILD, BICUSPID AORTIC VALVE STENOSIS  NORMAL RV SIZE AND FUNCTION  MILDLY ELEVATED PULMONARY PRESSURES  COMPARED WITH ECHO FROM 09/2016, AORTIC STENOSIS IS NOW MILD  RECOMMEND REPEAT ECHO IN 2-3 YEARS FOR AORTIC VALVE SURVEILLANCE.    02/03/2016: stress myoview Nuclear stress EF: 40%. ST segment depression was noted during stress in the V5, V6, III and aVF leadsST deviation beginning in recovery. This is an intermediate risk study. The left ventricular ejection fraction is moderately decreased (30-44%).   08/19/15: TTE Study Conclusions  - Left ventricle: The  cavity size was normal. There was moderate    concentric hypertrophy. Systolic function was normal. The    estimated ejection fraction was in the range of 60% to 65%. Wall    motion was normal; there were no regional wall motion    abnormalities. The study was not technically sufficient to allow     evaluation of LV diastolic dysfunction due to atrial flutter.  - Aortic valve: Trileaflet; severely thickened, severely calcified    leaflets. Valve mobility was restricted. There was mild    regurgitation.  - Aortic root: The aortic root was normal in size.  - Mitral valve: Structurally normal valve.  - Left atrium: The atrium was normal in size.  - Right ventricle: Systolic function was normal.  - Right atrium: The atrium was normal in size.  - Tricuspid valve: There was trivial regurgitation.  - Pulmonary arteries: Systolic pressure was within the normal    range.  - Inferior vena cava: The vessel was normal in size. The    respirophasic diameter changes were in the normal range (= 50%),    consistent with normal central venous pressure.  - Pericardium, extracardiac: There was no pericardial effusion.    Recent Labs: 03/08/2023: ALT 10; BUN 22; Creatinine, Ser 1.30; Hemoglobin 14.9; Platelets 308.0; Potassium 4.2; Sodium 141  03/08/2023: Cholesterol 131; HDL 41.10; LDL Cholesterol 53; Total CHOL/HDL Ratio 3; Triglycerides 187.0; VLDL 37.4   CrCl cannot be calculated (Unknown ideal weight.).   Wt Readings from Last 3 Encounters:  03/08/23 166 lb 12.8 oz (75.7 kg)  02/22/23 168 lb (76.2 kg)  02/12/23 168 lb 3.2 oz (76.3 kg)     Other studies reviewed: Additional studies/records reviewed today include: summarized above  ASSESSMENT AND PLAN:  PPM Intact function  Long discussion today by myself and industry with him today We addressed PMT/retrograde today, given this is his newest issue/observation This leaves his block rate unchanged. Industry uncertain that we can effectively accomplish solving both issues  His exertional complaints have been ongoing for a year or more unchanged He has a trip planned to [place] leaves next week Thursday I have asked him to wear a monitor for 3 days, walk and exercise (won't be able to swim with the monitor) And let us know how he  feels. If programming changes made today in any way make him feel poorly or change his exertional capacity (shouldn't) he will let us know Monday or so and we can revisit programming   CAD No anginal sounding complaints No CP with swimming C/w his attending cardiologist  VHD, bicuspid AV Mild AS  HTN Looks ok  5. Paroxysmal Afib CHA2DS2Vasc is 6, on Xarelto, appropriately dosed 0% burden Labs today  7. PVCs None noted today  8. Secondary hypercoagulable state     Disposition: back in amonth or so, sooner if needed.   Current medicines are reviewed at length with the patient today.  The patient did not have any concerns regarding medicines.  Norma Fredrickson, PA-C 03/26/2023 6:03 AM     [facility] [address] [place] [address] [phone] (office)  [phone] (fax)

## 2023-03-27 LAB — BASIC METABOLIC PANEL
BUN/Creatinine Ratio: 18 (ref 10–24)
BUN: 20 mg/dL (ref 8–27)
CO2: 20 mmol/L (ref 20–29)
Calcium: 9.7 mg/dL (ref 8.6–10.2)
Chloride: 105 mmol/L (ref 96–106)
Creatinine, Ser: 1.09 mg/dL (ref 0.76–1.27)
Glucose: 89 mg/dL (ref 70–99)
Potassium: 4.2 mmol/L (ref 3.5–5.2)
Sodium: 142 mmol/L (ref 134–144)
eGFR: 70 mL/min/{1.73_m2} (ref >59)

## 2023-03-27 LAB — CBC
Hematocrit: 45.5 % (ref 37.5–51.0)
Hemoglobin: 14.9 g/dL (ref 13.0–17.7)
MCH: 29.6 pg (ref 26.6–33.0)
MCHC: 32.7 g/dL (ref 31.5–35.7)
MCV: 90 fL (ref 79–97)
Platelets: 261 10*3/uL (ref 150–450)
RBC: 5.04 x10E6/uL (ref 4.14–5.80)
RDW: 14.4 % (ref 11.6–15.4)
WBC: 9.7 10*3/uL (ref 3.4–10.8)

## 2023-03-29 ENCOUNTER — Telehealth: Payer: Self-pay

## 2023-03-29 NOTE — Telephone Encounter (Signed)
MyChart message received regarding intermittent chest discomfort, intermittent HR elevations, and concern regarding Bio PPM programing.    Pt called after reviewing MyChart message, and was frustrated at lunchtime follow up telephone call, for what he considered a late reply.   Pt stated that he has had BIO PPM site discomfort, intermittent bilateral forearm numbness, and intermittent chest discomfort over the weekend.  Pt had an occurrence of his HR climbing to 151 bpm while walking per his watch, and one event that happened at 1230 am, 03/29/2023 while on the computer, HR increasing to 154 bpm with no cardiovascular activity?  Pt saw Ms. [name] PA-C on 03/26/2023, and was advised to contact [facility] if he became symptomatic or had HR elevations.    ( Pt provider note on 03/26/2023, Toprol dose increase was held, as he had very few 140 HR's.)  I advised the patient, that I would consult the Device RN team, since he has a Bio PPM, and call him back with instructions.    Mandy Device RN, after reviewing the 4/26 provider note, and researching the Pt Bio PPM history and current symptoms;  A providers visit was needed to evaluate symptoms vs device settings.  Pt was asymptomatic at time of telephone call.  Pt is scheduled to fly out of country this coming Thursday, and with Dr. [name] being out on vacation, Mr. Natasha Bence, EP APP schedule being full; Only option was to have Pt see Ms. Sherie Don NP in [place].  Pt called back from [employer], with [facility], and myself present; Pt asked if speaker phone was ok, and he said yes.  I introduced Land and Ms. [place] of scheduling.   ( Goal being to de-escalate his frustration with more than one team member being present....)   Mandy RN reviewed Pt symptoms with Pt, explaining the need to have him come back into [facility] for a providers assessment, and to revisit BIO PPM programing.  Pt advised we  scheduled an appointment with Ms. Sherie Don NP at [facility] on Wednesday, 03/31/2023, to reassess his symptoms and device.  Pt also advised that regarding his concerns - symptoms, with intermittent chest discomfort, intermittent bilateral forearm numbness, and random HR elevations, if they occur again, will need to be addressed by the nearest ER provider;  As these symptoms are an indicator for cardiac ischemia, and should not be delayed.  Pt verbalized understanding to the plan of care, and upcoming appointment on Wednesday.  Pt agreed to go to the nearest ER for return of symptoms as stated above.  Per Angelica Chessman RN, message will be sent to BIO PPM to have them present at this providers visit with Sherie Don NP.  Pt appreciated the appointment, and verbalized understanding to everything stated above.

## 2023-03-30 NOTE — Telephone Encounter (Signed)
Pt contacted per Ms. Nathan Dowse PA-C request to follow up / Ask if Pt is still wearing Zio heart monitor.    Pt called and updated that Ms. Nathan Don NP, Ms. Nathan Dowse, PA-C, and [name] are all aware of his concerns, and are up to speed with communication concern he expressed yesterday, during the beginning of the telephone encounter.  Pt stated that he has NOT been symptomatic today, but has not over exerted himself.   Pt asked if he was still wearing the Zio heart monitor, and Pt stated he removed it and sent it back yesterday.  He is no longer wearing the monitor.  This and the customer service follow up components of my conversation were messaged to Ms. Nathan Butte PA-C.  Pt appreciated know that both providers were aware of his concerns, and requested walking with Ms. Nathan Gandy, NP; He wanted to see if he becomes symptomatic or if chest discomfort occurs when walking.  I will forward this information to Ms. Riddle NP, as well.  Pt appreciated the follow up call from [facility].

## 2023-03-30 NOTE — Progress Notes (Unsigned)
Cardiology Office Note Date:  03/31/2023  Patient ID:  Arlis, Yale [birth year], MRN [id number] PCP:  Shelva Majestic, MD  Cardiologist:  [name], MD Electrophysiologist: None   Chief Complaint: elevated HR  History of Present Illness: BRONTE KROPF is a 77 y.o. male with PMH notable for HTN, Afib, CVA (post-LHC), SND s/p PPM, CAD, bicuspid aortic valve, HFrecEF, ; seen today for [name], MD for acute visit due to elevated heart rates.   He saw PA [place] last week. Was c/o elevated resting heart rate. Also feeling like he hits "a wall" when swimming. On device interrogation, having retrograde conduction. 2:1 block point is 109bpm. She adjusted his AV delay, increasing PVARP = .  The patient sent a mychart msg to clinic over the weekend that he developed chest tightness and weakness in bilat forearms. HR over 150 for a few minutes. HR also elevated again to 150 while resting.   Today he presents to discuss device adjustments. He has an international trip planned, leaving the state tomorrow.    Device Information: Biotronik dual chamber PPM, imp 08/2002; dx Gen change 12/2010 Has MDT leads   Past Medical History:  Diagnosis Date   Abscess    right posterior neck   Anticoagulation adequate 06/07/2019   Overview:  SSS, paroxAFib/Flutter, CHADSvasc= 3 age, h/o cva; chronic OAC Xarelto   Atrial fibrillation and flutter (HCC) 01/01/2015   Noted 2016. Xarelto    BPH associated with nocturia 08/14/2014   Finasteride, flomax.  0-3x nocturia. PSA to 14-->eval urology [facility]. PSA trended back down to about 2.6 per records in 2015 then placed on finasteride- had significant voiding issues when spiked to 14- improved on meds    Bradycardia    CAD (coronary artery disease) 05/26/2016   Coronary CTA_ 50-75% D1 off CT report    Cardiac pacemaker in situ 12/28/2009   SA node dysfunction as cause    Chest pressure 12/01/2015   Overview:  atypical CP, [facility] nuclear perfusion  imaging w/ no inarct, no ischemia, EF-40% NICM   Chronic systolic heart failure (HCC) 12/01/2015   Overview:  [facility] nuclear perfusion imaging study, no inarct, no ischemia, EF-40% NICM   CVA (cerebral vascular accident) (HCC)    Double vision 07/04/2018   Elevated LFTs    Erectile dysfunction 01/30/2015   cialis prn    External hemorrhoids    Fitting or adjustment of cardiac pacemaker 06/07/2019   Overview:  Biotronik [address] [id number], RA and RV leads [id number]:  [id number] and [id number]   Former smoker 01/30/2015   Former smoker. 20 pack years quit 92. Patient declined AAA screen.     GERD (gastroesophageal reflux disease)    Gout 11/11/2009   Qualifier: Diagnosis of  By: Oswald Hillock     History of nuclear stress test 06/07/2019   Overview:  [facility], no inarct, no ischemia, EF-40% NICM   HTN (hypertension)    Hyperglycemia 06/10/2017   110s CBG 2018   Hyperlipidemia 07/12/2007   Crestor 20mg  daily with some myalgias; fish oil. Trig 200-500 despite this. LDL ok.        Insomnia 01/30/2015   ambien 5mg  prn. May refill.     Ischemic optic neuropathy    Left chest pressure 03/19/2015   Other and unspecified hyperlipidemia    Sinoatrial node dysfunction (HCC)    Spinal stenosis    Trigger finger, right middle finger 03/01/2018    Past Surgical History:  Procedure Laterality Date  ABSCESS DRAINAGE     righ tposterior neck   CARDIAC CATHETERIZATION  1996   CHOLECYSTECTOMY  02/07/2003   COLONOSCOPY  06/23/2004   EYE SURGERY     left eye   left ingunial hernia  06/08/2011   PACEMAKER INSERTION     right hip replacement     2012 Dr. Despina Hick    Current Outpatient Medications  Medication Instructions   benazepril (LOTENSIN) 5 mg, Oral, Daily   cyclobenzaprine (FLEXERIL) 5 mg, Oral, 3 times daily PRN   finasteride (PROSCAR) 5 mg, Oral, Daily   gemfibrozil (LOPID) 600 MG tablet TAKE 1 TABLET BY MOUTH TWICE DAILY BEFORE  A  MEAL   icosapent Ethyl (VASCEPA) 1 g, Oral, 2 times daily    indomethacin (INDOCIN) 25 MG capsule TAKE 1 CAPSULE BY MOUTH TWICE DAILY WITH A MEAL AS NEEDED FOR PAIN. 3  DAYS  MAXIMUM   metoprolol succinate (TOPROL-XL) 25 mg, Oral, Daily   Multiple Vitamin (MULTIVITAMIN) tablet 1 tablet, Oral, Daily,     omeprazole (PRILOSEC) 20 MG capsule Take 1 capsule by mouth once daily   rosuvastatin (CRESTOR) 40 MG tablet Take 1 tablet by mouth once daily   tadalafil (CIALIS) 5 MG tablet TAKE ONE TABLET BY MOUTH DAILY AS NEEDED FOR ERECTILE DYSFUNCTION   tamsulosin (FLOMAX) 0.4 mg, Oral, Daily   XARELTO 20 MG TABS tablet TAKE 1 TABLET BY MOUTH ONCE DAILY WITH SUPPER   zolpidem (AMBIEN) 5 mg, Oral, Daily at bedtime    Social History:  The patient  reports that he quit smoking about 31 years ago. His smoking use included cigarettes. He has a 20.00 pack-year smoking history. He has never used smokeless tobacco. He reports current alcohol use. He reports that he does not use drugs.   Family History:   The patient's family history includes Brain cancer in his mother; Cancer in his brother; Diabetes in his father and paternal grandfather; Heart attack in his father; Heart attack (age of onset: 80) in his mother; Hypertension in his mother; Lung cancer in his mother.  ROS:  Please see the history of present illness. All other systems are reviewed and otherwise negative.   PHYSICAL EXAM:  VS:  BP 110/80 (BP Location: Left Arm, Patient Position: Sitting, Cuff Size: Normal)   Pulse 72   Ht 5\' 8"  (1.727 m)   Wt 165 lb (74.8 kg)   SpO2 98%   BMI 25.09 kg/m  BMI: Body mass index is 25.09 kg/m.  GEN- The patient is well appearing, alert and oriented x 3 today.   Lungs- Clear to ausculation bilaterally, normal work of breathing.  Heart- Regular rate and rhythm, no murmurs, rubs or gallops Extremities- No peripheral edema, warm, dry Skin-  device pocket well-healed    Device interrogation done today with industry present and reviewed by myself:  Battery ~11  months Lead thresholds, impedence, sensing stable  Brief atrial arrhythmia episodes noted, longest 2 minutes. Does not appear to be PMT though may be   EKG is not ordered.   Recent Labs: 03/08/2023: ALT 10 03/26/2023: BUN 20; Creatinine, Ser 1.09; Hemoglobin 14.9; Platelets 261; Potassium 4.2; Sodium 142  03/08/2023: Cholesterol 131; HDL 41.10; LDL Cholesterol 53; Total CHOL/HDL Ratio 3; Triglycerides 187.0; VLDL 37.4   Estimated Creatinine Clearance: 55.8 mL/min (by C-G formula based on SCr of 1.09 mg/dL).   Wt Readings from Last 3 Encounters:  03/31/23 165 lb (74.8 kg)  03/26/23 164 lb 3.2 oz (74.5 kg)  03/08/23 166 lb 12.8  oz (75.7 kg)     Additional studies reviewed include: Previous EP, cardiology notes.   09/04/21: cardiac MRI Comparison with prior stress cMRI (07/13/2016).  1. The left ventricle is normal in cavity size. There is mild basal sigmoid hypertrophy (max: 1.3 cm). The basal anteroseptum is focally hypokinetic. The LVEF is preserved at 60% (prior 50%).  2. The right ventricle is normal in cavity size, wall thickness, and systolic function. A pacemaker lead is seen traversing the tricuspid valve and terminating in the apical septum.  4. Both atria are normal in size. A pacemaker lead is seen in the right atrium, terminating in the right atrial appendage.  4. The aortic valve is bicommissural in morphology.  There is a raphe between the non and right coronary cusps. The leaflets are thickened.  There is mild aortic stenosis.  Peak aortic velocity measures 2.4 m/s (prior 1.7 m/s) with peak/mean gradients of 23/10 mm Hg (prior mean gradient 6 mm Hg). Aortic valve area measures 1.4 cm2 by planimetry.  There is mild aortic regurgitation.  5. There is mild thickening of the mitral valve leaflets. There is no significant mitral stenosis and mild mitral regurgitation.  6. There is at least mild tricuspid regurgitation. Complete evaluation is limited by metal artifact.  7. There is  mild pulmonic regurgitation.  8. Delayed enhancement MRI is abnormal.  There is hyperenhancement in the basal anteroseptum consistent with a subendocardial myocardial infarction in the distribution of the LAD (~3% total LV myocardial mass). The majority of the LAD perfusion territory is viable.  All other segments are fully viable.  9. There are no intracardiac thrombi.   There has been no significant change compared with prior cardiac MRI.   Chest MRA, 09/04/2021 1. The aortic root is normal in size. The ascending aorta, arch, and descending aorta are normal in diameter. There is no dissection seen. 2. The aortic arch is left sided. There is normal branching of the arch vessels.  3. The main and proximal branch pulmonary arteries are normal in size.  4. Normal systemic venous connections.  5.  There is no evidence of an intracardiac thrombus.  6. The pericardium is normal in thickness.  There is no pericardial effusion.   Bi-orthogonal luminal aortic dimensions are listed below:   Sinus of Valsalva (largest dimension, cross-sectional dimension): 3.4 x 2.7 cm  Sinotubular junction: 2.8 x 2.3 cm  Mid-ascending aorta: 3.4 x 3.3 cm  Proximal aortic arch (aorta at the origin of the innominate artery):3.2 x 3.2 cm  Mid-aortic arch (between left common carotid and subclavian arteries): 2.8 x 2.7 cm  Proximal descending thoracic aorta (begins at the isthmus, approximately 2 cm distal to the left subclavian artery): 2.4 x 2.3 cm  Mid-descending aorta: 2.4 x 2.3 cm  Aorta at diaphragm: 2.3 x 2.2 cm        04/29/21: EST Blood pressure demonstrated a normal response to exercise. There was no ST segment deviation noted during stress.     04/13/2018: TTE NORMAL LV SYSTOLIC FUNCTION WITH MILD DIASTOLIC DYSFUNCTION  MILD, BICUSPID AORTIC VALVE STENOSIS  NORMAL RV SIZE AND FUNCTION  MILDLY ELEVATED PULMONARY PRESSURES  COMPARED WITH ECHO FROM 09/2016, AORTIC STENOSIS IS NOW MILD  RECOMMEND REPEAT  ECHO IN 2-3 YEARS FOR AORTIC VALVE SURVEILLANCE.     02/03/2016: stress myoview Nuclear stress EF: 40%. ST segment depression was noted during stress in the V5, V6, III and aVF leadsST deviation beginning in recovery. This is an intermediate risk study. The  left ventricular ejection fraction is moderately decreased (30-44%).    08/19/15: TTE Study Conclusions  - Left ventricle: The cavity size was normal. There was moderate concentric hypertrophy. Systolic function was normal. The estimated ejection fraction was in the range of 60% to 65%. Wall motion was normal; there were no regional wall motion abnormalities. The study was not technically sufficient to allow evaluation of LV diastolic dysfunction due to atrial flutter.  - Aortic valve: Trileaflet; severely thickened, severely calcified leaflets. Valve mobility was restricted. There was mild regurgitation.  - Aortic root: The aortic root was normal in size.  - Mitral valve: Structurally normal valve.  - Left atrium: The atrium was normal in size.  - Right ventricle: Systolic function was normal.  - Right atrium: The atrium was normal in size.  - Tricuspid valve: There was trivial regurgitation.  - Pulmonary arteries: Systolic pressure was within the normal range.  - Inferior vena cava: The vessel was normal in size. The respirophasic diameter changes were in the normal range (= 50%), consistent with normal central venous pressure.  - Pericardium, extracardiac: There was no pericardial effusion.   ASSESSMENT AND PLAN:  #) SND s/p Biotronik PPM insertion Significant discussion with industry and patient  Explained to patient goal of taking settings back to previous point to allow him to travel comfortably, will make future adjustments once he is in-state for longer period of time Retrograde conduction re-measured at ~310.  Adjusted PVARP to . This allowed 2:1 block rate to be raised to 121bpm.  -- these settings are in-line with March  2024 device settings   #) CAD Concerning symptoms of chest discomfort with walking. Has not had any further discomfort. Had been swimming and walking with wife regularly without chest discomfort. Will continue to monitor symptoms, if persist or recur, he will reach out to general cardiologist for further work-up.   #) parox AFib Low burden by device  CHA2DS2-VASc Score = 7 [CHF History: 1, HTN History: 1, Diabetes History: 0, Stroke History: 2, Vascular Disease History: 1, Age Score: 2, Gender Score: 0].  Therefore, the patient's annual risk of stroke is 11.2 % OAC - xarelto 20mg  daily, appropriately dosed       Current medicines are reviewed at length with the patient today.   The patient does not have concerns regarding his medicines.  The following changes were made today:  none  Labs/ tests ordered today include:  No orders of the defined types were placed in this encounter.    Disposition: Follow up with Dr. Ladona Ridgel  as scheduled   If schedule allows, patient will see PA Keitha Butte the week of 5/20 as patient is in town that week, and then goes back out of town the follow-ing week. No current schedule openings, patient on cancellation list   Signed, Sherie Don, NP  03/31/23  4:27 PM  Electrophysiology [facility]

## 2023-03-31 ENCOUNTER — Ambulatory Visit: Payer: Medicare PPO | Attending: Cardiology | Admitting: Cardiology

## 2023-03-31 ENCOUNTER — Encounter: Payer: Self-pay | Admitting: Cardiology

## 2023-03-31 VITALS — BP 110/80 | HR 72 | Ht 68.0 in | Wt 165.0 lb

## 2023-03-31 DIAGNOSIS — Z950 Presence of cardiac pacemaker: Secondary | ICD-10-CM | POA: Diagnosis not present

## 2023-03-31 DIAGNOSIS — I495 Sick sinus syndrome: Secondary | ICD-10-CM | POA: Diagnosis not present

## 2023-03-31 DIAGNOSIS — R42 Dizziness and giddiness: Secondary | ICD-10-CM | POA: Diagnosis not present

## 2023-03-31 DIAGNOSIS — I480 Paroxysmal atrial fibrillation: Secondary | ICD-10-CM | POA: Diagnosis not present

## 2023-03-31 DIAGNOSIS — I2510 Atherosclerotic heart disease of native coronary artery without angina pectoris: Secondary | ICD-10-CM

## 2023-03-31 DIAGNOSIS — R002 Palpitations: Secondary | ICD-10-CM | POA: Diagnosis not present

## 2023-03-31 LAB — CUP PACEART INCLINIC DEVICE CHECK
Date Time Interrogation Session: 20240501164549
Implantable Lead Connection Status: 753985
Implantable Lead Connection Status: 753985
Implantable Lead Implant Date: 20031017
Implantable Lead Implant Date: 20031017
Implantable Lead Location: 753859
Implantable Lead Location: 753860
Implantable Lead Model: 5076
Implantable Lead Model: 5076
Implantable Pulse Generator Implant Date: 20120210
Pulse Gen Serial Number: 66063261

## 2023-03-31 NOTE — Patient Instructions (Signed)
Medication Instructions:  Your physician recommends that you continue on your current medications as directed. Please refer to the Current Medication list given to you today.  *If you need a refill on your cardiac medications before your next appointment, please call your pharmacy*  Lab Work: No labs ordered  If you have labs (blood work) drawn today and your tests are completely normal, you will receive your results only by: MyChart Message (if you have MyChart) OR A paper copy in the mail If you have any lab test that is abnormal or we need to change your treatment, we will call you to review the results.   Testing/Procedures: No testing ordered  Follow-Up: At [facility], you and your health needs are our priority.  As part of our continuing mission to provide you with exceptional heart care, we have created designated Provider Care Teams.  These Care Teams include your primary Cardiologist (physician) and Advanced Practice Providers (APPs -  Physician Assistants and Nurse Practitioners) who all work together to provide you with the care you need, when you need it.  We recommend signing up for the patient portal called "MyChart".  Sign up information is provided on this After Visit Summary.  MyChart is used to connect with patients for Virtual Visits (Telemedicine).  Patients are able to view lab/test results, encounter notes, upcoming appointments, etc.  Non-urgent messages can be sent to your provider as well.   To learn more about what you can do with MyChart, go to [email or web address].    Your next appointment:   You are being placed on the cancellation list for the week of May 20-24, 2024  Provider:   [name], PA-C

## 2023-04-19 ENCOUNTER — Encounter: Payer: Self-pay | Admitting: Sports Medicine

## 2023-04-20 ENCOUNTER — Other Ambulatory Visit: Payer: Self-pay | Admitting: Sports Medicine

## 2023-04-20 DIAGNOSIS — G8929 Other chronic pain: Secondary | ICD-10-CM

## 2023-04-20 DIAGNOSIS — M5136 Other intervertebral disc degeneration, lumbar region: Secondary | ICD-10-CM

## 2023-04-20 DIAGNOSIS — I2510 Atherosclerotic heart disease of native coronary artery without angina pectoris: Secondary | ICD-10-CM | POA: Diagnosis not present

## 2023-04-20 DIAGNOSIS — I350 Nonrheumatic aortic (valve) stenosis: Secondary | ICD-10-CM | POA: Diagnosis not present

## 2023-04-20 DIAGNOSIS — I493 Ventricular premature depolarization: Secondary | ICD-10-CM | POA: Diagnosis not present

## 2023-04-20 DIAGNOSIS — M47816 Spondylosis without myelopathy or radiculopathy, lumbar region: Secondary | ICD-10-CM

## 2023-04-20 NOTE — Progress Notes (Unsigned)
Can we please order a repeat right-sided L3-4 epidural CSI and let patient know that this order has been placed.  We can let him know that ideally we wait 3 months in between injections, however the first 2 to 3 injections often have to be repeated more frequently than that to get lasting and significant relief.  Thank you

## 2023-04-25 ENCOUNTER — Other Ambulatory Visit: Payer: Self-pay | Admitting: Family Medicine

## 2023-04-28 ENCOUNTER — Telehealth: Payer: Self-pay | Admitting: Sports Medicine

## 2023-04-28 NOTE — Telephone Encounter (Signed)
Patient called stating that he is scheduled for an epidural tomorrow but they are still waiting for authorization. Lynden Ang told him that if we changed the order to URGENT, she could add that the waiting auth to hopefully push it along.  Can we change that?   (He is in a lot of pain and will be going out of town soon. The only way to get it done before he leaves with the appointments they have available would be tomorrow.[name])

## 2023-04-28 NOTE — Telephone Encounter (Signed)
New referral was placed with [facility]

## 2023-04-29 ENCOUNTER — Other Ambulatory Visit: Payer: Self-pay | Admitting: Family Medicine

## 2023-04-29 ENCOUNTER — Inpatient Hospital Stay
Admission: RE | Admit: 2023-04-29 | Discharge: 2023-04-29 | Disposition: A | Discharge status: Home / Self Care | Payer: Medicare PPO | Source: Ambulatory Visit | Attending: Sports Medicine | Admitting: Sports Medicine

## 2023-04-29 ENCOUNTER — Ambulatory Visit
Admission: RE | Admit: 2023-04-29 | Discharge: 2023-04-29 | Disposition: A | Discharge status: Home / Self Care | Payer: Medicare PPO | Source: Ambulatory Visit | Attending: Sports Medicine | Admitting: Sports Medicine

## 2023-04-29 DIAGNOSIS — G8929 Other chronic pain: Secondary | ICD-10-CM

## 2023-04-29 DIAGNOSIS — M4727 Other spondylosis with radiculopathy, lumbosacral region: Secondary | ICD-10-CM | POA: Diagnosis not present

## 2023-04-29 DIAGNOSIS — M47816 Spondylosis without myelopathy or radiculopathy, lumbar region: Secondary | ICD-10-CM

## 2023-04-29 DIAGNOSIS — M5136 Other intervertebral disc degeneration, lumbar region: Secondary | ICD-10-CM

## 2023-04-29 MED ORDER — IOPAMIDOL (ISOVUE-M 200) INJECTION 41%
1.0000 mL | Freq: Once | INTRAMUSCULAR | Status: AC
  Administered 2023-04-29: 1 mL via EPIDURAL

## 2023-04-29 MED ORDER — METHYLPREDNISOLONE ACETATE 40 MG/ML INJ SUSP (RADIOLOG
80.0000 mg | Freq: Once | INTRAMUSCULAR | Status: AC
  Administered 2023-04-29: 80 mg via EPIDURAL

## 2023-04-29 NOTE — Telephone Encounter (Signed)
This now requires a Peer to Peer. Has been scheduled with Dr Jean Rosenthal on Monday when he returns.

## 2023-04-29 NOTE — Discharge Instructions (Signed)

## 2023-04-29 NOTE — Discharge Instructions (Signed)

## 2023-05-11 NOTE — Progress Notes (Signed)
    Aleen Sells D.[facility] [address] [place] [address] Phone: [phone]   Assessment and Plan:     There are no diagnoses linked to this encounter.  ***   Pertinent previous records reviewed include ***   Follow Up: ***     Subjective:   I,  , am serving as a Neurosurgeon for Doctor Richardean Sale   Chief Complaint: low back pain    HPI:    01/28/2023 Patient is a 77 year old male complaining of low back pain. Patient states chronic intermittent pain 2-3 months ago he started having new pain that started in his thigh up to his butt and down to his knees , pain when standing , bending over, pain when coughing and sneezing, pain is a pressure feeling , he was taking indomethacin , has been taking tylenol does not seems to help , no numbness or tingling ,   05/12/2023 Patient states   Relevant Historical Information: On chronic anticoagulation with Xarelto, hypertension, paroxysmal atrial fibrillation, CAD, CHF    Additional pertinent review of systems negative.   Current Outpatient Medications:    benazepril (LOTENSIN) 5 MG tablet, Take 1 tablet (5 mg total) by mouth daily., Disp: 90 tablet, Rfl: 3   cyclobenzaprine (FLEXERIL) 5 MG tablet, Take 1 tablet (5 mg total) by mouth 3 (three) times daily as needed for muscle spasms., Disp: 30 tablet, Rfl: 2   finasteride (PROSCAR) 5 MG tablet, Take 1 tablet by mouth once daily, Disp: 90 tablet, Rfl: 0   gemfibrozil (LOPID) 600 MG tablet, TAKE 1 TABLET BY MOUTH TWICE DAILY BEFORE  A  MEAL, Disp: 180 tablet, Rfl: 3   icosapent Ethyl (VASCEPA) 1 g capsule, Take 1 g by mouth 2 (two) times daily., Disp: , Rfl:    indomethacin (INDOCIN) 25 MG capsule, TAKE 1 CAPSULE BY MOUTH TWICE DAILY WITH A MEAL AS NEEDED FOR PAIN. 3  DAYS  MAXIMUM, Disp: 30 capsule, Rfl: 0   metoprolol succinate (TOPROL-XL) 25 MG 24 hr tablet, Take 1 tablet (25 mg total) by mouth daily., Disp: 90 tablet, Rfl: 3    Multiple Vitamin (MULTIVITAMIN) tablet, Take 1 tablet by mouth daily., Disp: , Rfl:    omeprazole (PRILOSEC) 20 MG capsule, Take 1 capsule by mouth once daily, Disp: 90 capsule, Rfl: 0   rosuvastatin (CRESTOR) 40 MG tablet, Take 1 tablet by mouth once daily, Disp: 90 tablet, Rfl: 0   tadalafil (CIALIS) 5 MG tablet, TAKE ONE TABLET BY MOUTH DAILY AS NEEDED FOR ERECTILE DYSFUNCTION, Disp: 90 tablet, Rfl: 3   tamsulosin (FLOMAX) 0.4 MG CAPS capsule, Take 1 capsule by mouth once daily, Disp: 90 capsule, Rfl: 0   XARELTO 20 MG TABS tablet, TAKE 1 TABLET BY MOUTH ONCE DAILY WITH SUPPER, Disp: 90 tablet, Rfl: 2   zolpidem (AMBIEN) 5 MG tablet, Take 1 tablet (5 mg total) by mouth at bedtime., Disp: 90 tablet, Rfl: 1   Objective:     There were no vitals filed for this visit.    There is no height or weight on file to calculate BMI.    Physical Exam:    ***   Electronically signed by:  Aleen Sells D.[facility] 7:11 AM 05/11/23

## 2023-05-12 ENCOUNTER — Ambulatory Visit: Payer: Medicare PPO | Admitting: Sports Medicine

## 2023-05-12 VITALS — BP 130/82 | HR 97 | Ht 68.0 in | Wt 168.0 lb

## 2023-05-12 DIAGNOSIS — M5441 Lumbago with sciatica, right side: Secondary | ICD-10-CM

## 2023-05-12 DIAGNOSIS — M47816 Spondylosis without myelopathy or radiculopathy, lumbar region: Secondary | ICD-10-CM | POA: Diagnosis not present

## 2023-05-12 DIAGNOSIS — M5442 Lumbago with sciatica, left side: Secondary | ICD-10-CM | POA: Diagnosis not present

## 2023-05-12 DIAGNOSIS — G8929 Other chronic pain: Secondary | ICD-10-CM | POA: Diagnosis not present

## 2023-05-12 DIAGNOSIS — M5136 Other intervertebral disc degeneration, lumbar region: Secondary | ICD-10-CM | POA: Diagnosis not present

## 2023-05-12 NOTE — Patient Instructions (Addendum)
Repeat epidural bilat L3-4

## 2023-05-14 ENCOUNTER — Ambulatory Visit: Payer: Medicare PPO | Attending: Internal Medicine | Admitting: Internal Medicine

## 2023-05-14 ENCOUNTER — Telehealth: Payer: Self-pay | Admitting: Internal Medicine

## 2023-05-14 ENCOUNTER — Encounter: Payer: Self-pay | Admitting: Internal Medicine

## 2023-05-14 VITALS — BP 108/60 | HR 84 | Ht 68.0 in | Wt 169.8 lb

## 2023-05-14 DIAGNOSIS — I495 Sick sinus syndrome: Secondary | ICD-10-CM | POA: Diagnosis not present

## 2023-05-14 MED ORDER — METOPROLOL TARTRATE 25 MG PO TABS: 25.0000 mg | ORAL_TABLET | ORAL | 3 refills | Status: AC | PRN

## 2023-05-14 NOTE — Patient Instructions (Signed)
Medication Instructions:  Your physician has recommended you make the following change in your medication:  1) START taking Lopressor (metoprolol tartrate) as needed for heart rate greater than 120 *If you need a refill on your cardiac medications before your next appointment, please call your pharmacy*  Follow-Up: At [facility], you and your health needs are our priority.  As part of our continuing mission to provide you with exceptional heart care, we have created designated Provider Care Teams.  These Care Teams include your primary Cardiologist (physician) and Advanced Practice Providers (APPs -  Physician Assistants and Nurse Practitioners) who all work together to provide you with the care you need, when you need it.  Your next appointment:   5 months  Provider:   [name], MD

## 2023-05-14 NOTE — Progress Notes (Signed)
HPI [name] returns today for followup. He is a pleasant 77 yo man with sinus node dysfunction, s/p PPM insertion. He has non-obstructive CAD. He has dyslipidemia and is on statin therapy. In the interim, he notes he feels well and has gone back to swimming. He denies palpitations. He notes that his HR gets into the 120 range at times for no clear cut reason.  He has occaisional palpitations.  No Known Allergies   Current Outpatient Medications  Medication Sig Dispense Refill   metoprolol tartrate (LOPRESSOR) 25 MG tablet Take 1 tablet (25 mg total) by mouth as needed. Take 1 tablet (25 mg total) as needed for heart rate greater than 120 45 tablet 3   benazepril (LOTENSIN) 5 MG tablet Take 1 tablet (5 mg total) by mouth daily. 90 tablet 3   cyclobenzaprine (FLEXERIL) 5 MG tablet Take 1 tablet (5 mg total) by mouth 3 (three) times daily as needed for muscle spasms. 30 tablet 2   finasteride (PROSCAR) 5 MG tablet Take 1 tablet by mouth once daily 90 tablet 0   gemfibrozil (LOPID) 600 MG tablet TAKE 1 TABLET BY MOUTH TWICE DAILY BEFORE  A  MEAL 180 tablet 3   icosapent Ethyl (VASCEPA) 1 g capsule Take 1 g by mouth 2 (two) times daily.     indomethacin (INDOCIN) 25 MG capsule TAKE 1 CAPSULE BY MOUTH TWICE DAILY WITH A MEAL AS NEEDED FOR PAIN. 3  DAYS  MAXIMUM 30 capsule 0   metoprolol succinate (TOPROL-XL) 25 MG 24 hr tablet Take 1 tablet (25 mg total) by mouth daily. 90 tablet 3   Multiple Vitamin (MULTIVITAMIN) tablet Take 1 tablet by mouth daily.     omeprazole (PRILOSEC) 20 MG capsule Take 1 capsule by mouth once daily 90 capsule 0   rosuvastatin (CRESTOR) 40 MG tablet Take 1 tablet by mouth once daily 90 tablet 0   tadalafil (CIALIS) 5 MG tablet TAKE ONE TABLET BY MOUTH DAILY AS NEEDED FOR ERECTILE DYSFUNCTION 90 tablet 3   tamsulosin (FLOMAX) 0.4 MG CAPS capsule Take 1 capsule by mouth once daily 90 capsule 0   XARELTO 20 MG TABS tablet TAKE 1 TABLET BY MOUTH ONCE DAILY WITH  SUPPER 90 tablet 2   zolpidem (AMBIEN) 5 MG tablet Take 1 tablet (5 mg total) by mouth at bedtime. 90 tablet 1   No current facility-administered medications for this visit.     Past Medical History:  Diagnosis Date   Abscess    right posterior neck   Anticoagulation adequate 06/07/2019   Overview:  SSS, paroxAFib/Flutter, CHADSvasc= 3 age, h/o cva; chronic OAC Xarelto   Atrial fibrillation and flutter (HCC) 01/01/2015   Noted 2016. Xarelto    BPH associated with nocturia 08/14/2014   Finasteride, flomax.  0-3x nocturia. PSA to 14-->eval urology [facility]. PSA trended back down to about 2.6 per records in 2015 then placed on finasteride- had significant voiding issues when spiked to 14- improved on meds    Bradycardia    CAD (coronary artery disease) 05/26/2016   Coronary CTA_ 50-75% D1 off CT report    Cardiac pacemaker in situ 12/28/2009   SA node dysfunction as cause    Chest pressure 12/01/2015   Overview:  atypical CP, [facility] nuclear perfusion imaging w/ no inarct, no ischemia, EF-40% NICM   Chronic systolic heart failure (HCC) 12/01/2015   Overview:  [facility] nuclear perfusion imaging study, no inarct, no ischemia, EF-40% NICM   CVA (cerebral vascular  accident) [facility])    Double vision 07/04/2018   Elevated LFTs    Erectile dysfunction 01/30/2015   cialis prn    External hemorrhoids    Fitting or adjustment of cardiac pacemaker 06/07/2019   Overview:  Biotronik [place] DR [id number], RA and RV leads [id number]:  [id number] and [id number]   Former smoker 01/30/2015   Former smoker. 20 pack years quit 92. Patient declined AAA screen.     GERD (gastroesophageal reflux disease)    Gout 11/11/2009   Qualifier: Diagnosis of  By: Oswald Hillock     History of nuclear stress test 06/07/2019   Overview:  [facility], no inarct, no ischemia, EF-40% NICM   HTN (hypertension)    Hyperglycemia 06/10/2017   110s CBG 2018   Hyperlipidemia 07/12/2007   Crestor 20mg  daily with some myalgias; fish oil. Trig 200-500 despite this. LDL  ok.        Insomnia 01/30/2015   ambien 5mg  prn. May refill.     Ischemic optic neuropathy    Left chest pressure 03/19/2015   Other and unspecified hyperlipidemia    Sinoatrial node dysfunction (HCC)    Spinal stenosis    Trigger finger, right middle finger 03/01/2018    ROS:   All systems reviewed and negative except as noted in the HPI.   Past Surgical History:  Procedure Laterality Date   ABSCESS DRAINAGE     righ tposterior neck   CARDIAC CATHETERIZATION  1996   CHOLECYSTECTOMY  02/07/2003   COLONOSCOPY  06/23/2004   EYE SURGERY     left eye   left ingunial hernia  06/08/2011   PACEMAKER INSERTION     right hip replacement     2012 Dr. Despina Hick     Family History  Problem Relation Age of Onset   Lung cancer Mother        mets to brain   Heart attack Mother 43   Hypertension Mother    Brain cancer Mother    Heart attack Father        mid 22s, smoker.    Diabetes Father    Cancer Brother    Diabetes Paternal Grandfather    Colon cancer Neg Hx    Esophageal cancer Neg Hx    Stomach cancer Neg Hx    Rectal cancer Neg Hx      Social History   Socioeconomic History   Marital status: Married    Spouse name: Not on file   Number of children: 1   Years of education: 20   Highest education level: Not on file  Occupational History   Not on file  Tobacco Use   Smoking status: Former    Packs/day: 1.00    Years: 20.00    Additional pack years: 0.00    Total pack years: 20.00    Types: Cigarettes    Quit date: 11/04/1991    Years since quitting: 31.5   Smokeless tobacco: Never  Vaping Use   Vaping Use: Never used  Substance and Sexual Activity   Alcohol use: Yes    Alcohol/week: 0.0 standard drinks of alcohol    Comment: occ   Drug use: No   Sexual activity: Not on file  Other Topics Concern   Not on file  Social History Narrative   Married. 1 child and 1 step child. 2 grandchildren.       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      Hobbies: swimming (states  not fun), travel  Lives in a single story home   Social Determinants of Health   Financial Resource Strain: Low Risk  (02/22/2023)   Overall Financial Resource Strain (CARDIA)    Difficulty of Paying Living Expenses: Not hard at all  Food Insecurity: No Food Insecurity (02/22/2023)   Hunger Vital Sign    Worried About Running Out of Food in the Last Year: Never true    Ran Out of Food in the Last Year: Never true  Transportation Needs: No Transportation Needs (02/22/2023)   PRAPARE - Administrator, Civil Service (Medical): No    Lack of Transportation (Non-Medical): No  Physical Activity: Sufficiently Active (02/22/2023)   Exercise Vital Sign    Days of Exercise per Week: 4 days    Minutes of Exercise per Session: 60 min  Stress: No Stress Concern Present (02/22/2023)   [employer] of Occupational Health - Occupational Stress Questionnaire    Feeling of Stress : Not at all  Social Connections: Moderately Integrated (02/22/2023)   Social Connection and Isolation Panel [NHANES]    Frequency of Communication with Friends and Family: More than three times a week    Frequency of Social Gatherings with Friends and Family: More than three times a week    Attends Religious Services: Never    Database administrator or Organizations: Yes    Attends Banker Meetings: 1 to 4 times per year    Marital Status: Married  Catering manager Violence: Not At Risk (02/22/2023)   Humiliation, Afraid, Rape, and Kick questionnaire    Fear of Current or Ex-Partner: No    Emotionally Abused: No    Physically Abused: No    Sexually Abused: No     BP 108/60   Pulse 84   Ht 5\' 8"  (1.727 m)   Wt 169 lb 12.8 oz (77 kg)   SpO2 96%   BMI 25.82 kg/m   Physical Exam:  Well appearing NAD HEENT: Unremarkable Neck:  No JVD, no thyromegally Lymphatics:  No adenopathy Back:  No CVA tenderness Lungs:  Clear HEART:  Regular rate rhythm, no murmurs, no rubs, no clicks Abd:   soft, positive bowel sounds, no organomegally, no rebound, no guarding Ext:  2 plus pulses, no edema, no cyanosis, no clubbing Skin:  No rashes no nodules Neuro:  CN II through XII intact, motor grossly intact   DEVICE  Normal device function.  See PaceArt for details.   Assess/Plan: 1.  Sinus node dysfunction -he has no underlying escape rhythm.  He is stable status post pacemaker insertion. He appears to have normal chronotropic responsed to exercise. I suspect that he is having an atrial tachycardia.  2.  Pacemaker -his Biotronik dual-chamber pacemaker is working normally.  His histograms look good.  He will be rechecked in several months. He is approaching ERI. 3.  Coronary artery disease -he denies anginal symptoms.  He has nonobstructive disease except for some branch vessel disease for which she is asymptomatic despite exercising vigorously.  He will undergo watchful waiting.  No change in his medications. 4.  Dyslipidemia -he has a preponderance of hypertriglyceridemia.  He is on multiple medications which he will continue.     Sharlot Gowda ,MD

## 2023-05-14 NOTE — Telephone Encounter (Signed)
Called pharmacy back to let them know patient can take metoprolol tartrate every 12 hours as needed for HR over 120.

## 2023-05-14 NOTE — Telephone Encounter (Signed)
Pt c/o medication issue:  1. Name of Medication: metoprolol tartrate (LOPRESSOR) 25 MG tablet   2. How are you currently taking this medication (dosage and times per day)?    Take 1 tablet (25 mg total) by mouth as needed. Take 1 tablet (25 mg total) as needed for heart rate greater than 120    3. Are you having a reaction (difficulty breathing--STAT)? no  4. What is your medication issue? Pharmacy is calling to get a max daily dosage for t\his medication. Please advise

## 2023-05-20 ENCOUNTER — Encounter: Payer: Self-pay | Admitting: Internal Medicine

## 2023-06-11 ENCOUNTER — Telehealth: Payer: Self-pay | Admitting: Internal Medicine

## 2023-06-11 NOTE — Telephone Encounter (Signed)
Patient is calling stating he received a VM from [place] the other day stating she was calling to schedule an appt. I was unable to find an appt in need of being scheduled due to the patient already being scheduled to see Dr. Ladona Ridgel in November. Please advise.

## 2023-06-11 NOTE — Telephone Encounter (Signed)
Spoke with patient who reports a Dahlia Client from [place] was calling him to schedule an appointment. He is not sure what the appointment would be for. He states the number provided on the message directed him to our office at [facility].  Unable to determine who called and what appointment is needing to be scheduled.  I do see a recall for the [facility] for a 6 month F/U on PPM device due for July 2024.  Will forward to [facility] to see if someone from there has attempted to contact patient.  Informed patient if an appointment is needing to be scheduler the caller will likely try to reach out again. If we find out anything on our end we will let him know.  Patient verbalized understanding and expressed appreciation for call.

## 2023-06-17 ENCOUNTER — Other Ambulatory Visit: Payer: Self-pay | Admitting: Family Medicine

## 2023-06-28 DIAGNOSIS — I25118 Atherosclerotic heart disease of native coronary artery with other forms of angina pectoris: Secondary | ICD-10-CM | POA: Diagnosis not present

## 2023-06-28 DIAGNOSIS — E782 Mixed hyperlipidemia: Secondary | ICD-10-CM | POA: Diagnosis not present

## 2023-06-28 DIAGNOSIS — I350 Nonrheumatic aortic (valve) stenosis: Secondary | ICD-10-CM | POA: Diagnosis not present

## 2023-06-28 DIAGNOSIS — I428 Other cardiomyopathies: Secondary | ICD-10-CM | POA: Diagnosis not present

## 2023-06-28 DIAGNOSIS — R002 Palpitations: Secondary | ICD-10-CM | POA: Diagnosis not present

## 2023-06-29 ENCOUNTER — Encounter: Payer: Self-pay | Admitting: Family Medicine

## 2023-06-29 DIAGNOSIS — Z86010 Personal history of colonic polyps: Secondary | ICD-10-CM

## 2023-07-05 ENCOUNTER — Encounter: Payer: Self-pay | Admitting: Physician Assistant

## 2023-07-27 ENCOUNTER — Ambulatory Visit
Admission: RE | Admit: 2023-07-27 | Discharge: 2023-07-27 | Disposition: A | Discharge status: Home / Self Care | Payer: Medicare PPO | Source: Ambulatory Visit | Attending: Sports Medicine | Admitting: Sports Medicine

## 2023-07-27 DIAGNOSIS — M47816 Spondylosis without myelopathy or radiculopathy, lumbar region: Secondary | ICD-10-CM

## 2023-07-27 DIAGNOSIS — G8929 Other chronic pain: Secondary | ICD-10-CM

## 2023-07-27 DIAGNOSIS — M5136 Other intervertebral disc degeneration, lumbar region: Secondary | ICD-10-CM

## 2023-07-27 MED ORDER — IOPAMIDOL (ISOVUE-M 200) INJECTION 41%: 1.0000 mL | Freq: Once | INTRAMUSCULAR | Status: DC

## 2023-07-27 MED ORDER — METHYLPREDNISOLONE ACETATE 40 MG/ML INJ SUSP (RADIOLOG: 80.0000 mg | Freq: Once | INTRAMUSCULAR | Status: DC

## 2023-07-27 NOTE — Discharge Instructions (Signed)
Post Procedure Spinal Discharge Instruction Sheet  You may resume a regular diet and any medications that you routinely take (including pain medications) unless otherwise noted by MD.  No driving day of procedure.  Light activity throughout the rest of the day.  Do not do any strenuous work, exercise, bending or lifting.  The day following the procedure, you can resume normal physical activity but you should refrain from exercising or physical therapy for at least three days thereafter.  You may apply ice to the injection site, 20 minutes on, 20 minutes off, as needed. Do not apply ice directly to skin.    Common Side Effects:  Headaches- take your usual medications as directed by your physician.  Increase your fluid intake.  Caffeinated beverages may be helpful.  Lie flat in bed until your headache resolves.  Restlessness or inability to sleep- you may have trouble sleeping for the next few days.  Ask your referring physician if you need any medication for sleep.  Facial flushing or redness- should subside within a few days.  Increased pain- a temporary increase in pain a day or two following your procedure is not unusual.  Take your pain medication as prescribed by your referring physician.  Leg cramps  Please contact our office at [phone] for the following symptoms: Fever greater than 100 degrees. Headaches unresolved with medication after 2-3 days. Increased swelling, pain, or redness at injection site.  YOU MAY RESUME YOUR XARELTO 24 HOURS POST PROCEDURE.  Thank you for visiting [facility] today.

## 2023-07-28 NOTE — Discharge Instructions (Signed)

## 2023-07-29 ENCOUNTER — Ambulatory Visit
Admission: RE | Admit: 2023-07-29 | Discharge: 2023-07-29 | Disposition: A | Discharge status: Home / Self Care | Payer: Medicare PPO | Source: Ambulatory Visit | Attending: Sports Medicine | Admitting: Sports Medicine

## 2023-07-29 DIAGNOSIS — M4727 Other spondylosis with radiculopathy, lumbosacral region: Secondary | ICD-10-CM | POA: Diagnosis not present

## 2023-07-29 MED ORDER — METHYLPREDNISOLONE ACETATE 40 MG/ML INJ SUSP (RADIOLOG
80.0000 mg | Freq: Once | INTRAMUSCULAR | Status: AC
  Administered 2023-07-29: 80 mg via EPIDURAL

## 2023-07-29 MED ORDER — IOPAMIDOL (ISOVUE-M 200) INJECTION 41%
1.0000 mL | Freq: Once | INTRAMUSCULAR | Status: AC
  Administered 2023-07-29: 1 mL via EPIDURAL

## 2023-09-07 ENCOUNTER — Ambulatory Visit: Payer: Medicare PPO | Admitting: Physician Assistant

## 2023-09-07 ENCOUNTER — Encounter: Payer: Self-pay | Admitting: Physician Assistant

## 2023-09-07 VITALS — BP 110/70 | HR 65 | Temp 97.5°F | Ht 68.0 in | Wt 168.0 lb

## 2023-09-07 DIAGNOSIS — M545 Low back pain, unspecified: Secondary | ICD-10-CM

## 2023-09-07 DIAGNOSIS — R208 Other disturbances of skin sensation: Secondary | ICD-10-CM

## 2023-09-07 MED ORDER — PREDNISONE 50 MG PO TABS: ORAL_TABLET | ORAL | 0 refills | Status: DC

## 2023-09-07 MED ORDER — VALACYCLOVIR HCL 1 G PO TABS: 1000.0000 mg | ORAL_TABLET | Freq: Three times a day (TID) | ORAL | 0 refills | Status: DC

## 2023-09-07 NOTE — Progress Notes (Signed)
Subjective:    Patient ID: [name], male    DOB: January 07, 1946, 77 y.o.   MRN: [id number]  Chief Complaint  Patient presents with   Abdominal Pain    Pt c/o discomfort right side of abdomen, started on Saturday. Taking Tylenol with relief. Pt not sure if shingles due to no rash.    HPI Patient is in today for right sided abdominal 'skin' pain starting three days ago. States this feels like when he had shingles previously, no rash is present. Pain is controlled with Tylenol and seems to be better today. Also has separate R lower back pain that has flared up recently. No other symptoms.  Past Medical History:  Diagnosis Date   Abscess    right posterior neck   Anticoagulation adequate 06/07/2019   Overview:  SSS, paroxAFib/Flutter, CHADSvasc= 3 age, h/o cva; chronic OAC Xarelto   Atrial fibrillation and flutter (HCC) 01/01/2015   Noted 2016. Xarelto    BPH associated with nocturia 08/14/2014   Finasteride, flomax.  0-3x nocturia. PSA to 14-->eval urology [facility]. PSA trended back down to about 2.6 per records in 2015 then placed on finasteride- had significant voiding issues when spiked to 14- improved on meds    Bradycardia    CAD (coronary artery disease) 05/26/2016   Coronary CTA_ 50-75% D1 off CT report    Cardiac pacemaker in situ 12/28/2009   SA node dysfunction as cause    Chest pressure 12/01/2015   Overview:  atypical CP, [facility] nuclear perfusion imaging w/ no inarct, no ischemia, EF-40% NICM   Chronic systolic heart failure (HCC) 12/01/2015   Overview:  [facility] nuclear perfusion imaging study, no inarct, no ischemia, EF-40% NICM   CVA (cerebral vascular accident) (HCC)    Double vision 07/04/2018   Elevated LFTs    Erectile dysfunction 01/30/2015   cialis prn    External hemorrhoids    Fitting or adjustment of cardiac pacemaker 06/07/2019   Overview:  Biotronik [place] DR [id number], RA and RV leads [id number]:  [id number] and [id number]   Former smoker 01/30/2015   Former smoker. 20 pack years  quit 92. Patient declined AAA screen.     GERD (gastroesophageal reflux disease)    Gout 11/11/2009   Qualifier: Diagnosis of  By: Oswald Hillock     History of nuclear stress test 06/07/2019   Overview:  [facility], no inarct, no ischemia, EF-40% NICM   HTN (hypertension)    Hyperglycemia 06/10/2017   110s CBG 2018   Hyperlipidemia 07/12/2007   Crestor 20mg  daily with some myalgias; fish oil. Trig 200-500 despite this. LDL ok.        Insomnia 01/30/2015   ambien 5mg  prn. May refill.     Ischemic optic neuropathy    Left chest pressure 03/19/2015   Other and unspecified hyperlipidemia    Sinoatrial node dysfunction (HCC)    Spinal stenosis    Trigger finger, right middle finger 03/01/2018    Past Surgical History:  Procedure Laterality Date   ABSCESS DRAINAGE     righ tposterior neck   CARDIAC CATHETERIZATION  1996   CHOLECYSTECTOMY  02/07/2003   COLONOSCOPY  06/23/2004   EYE SURGERY     left eye   left ingunial hernia  06/08/2011   PACEMAKER INSERTION     right hip replacement     2012 Dr. Despina Hick    Family History  Problem Relation Age of Onset   Lung cancer Mother        mets  to brain   Heart attack Mother 4   Hypertension Mother    Brain cancer Mother    Heart attack Father        mid 75s, smoker.    Diabetes Father    Cancer Brother    Diabetes Paternal Grandfather    Colon cancer Neg Hx    Esophageal cancer Neg Hx    Stomach cancer Neg Hx    Rectal cancer Neg Hx     Social History   Tobacco Use   Smoking status: Former    Current packs/day: 0.00    Average packs/day: 1 pack/day for 20.0 years (20.0 ttl pk-yrs)    Types: Cigarettes    Start date: 11/04/1971    Quit date: 11/04/1991    Years since quitting: 31.8   Smokeless tobacco: Never  Vaping Use   Vaping status: Never Used  Substance Use Topics   Alcohol use: Yes    Alcohol/week: 0.0 standard drinks of alcohol    Comment: occ   Drug use: No     No Known Allergies  Review of Systems NEGATIVE  UNLESS OTHERWISE INDICATED IN HPI      Objective:     BP 110/70 (BP Location: Left Arm, Patient Position: Sitting, Cuff Size: Normal)   Pulse 65   Temp (!) 97.5 F (36.4 C) (Temporal)   Ht 5\' 8"  (1.727 m)   Wt 168 lb (76.2 kg)   SpO2 97%   BMI 25.54 kg/m   Wt Readings from Last 3 Encounters:  09/07/23 168 lb (76.2 kg)  05/14/23 169 lb 12.8 oz (77 kg)  05/12/23 168 lb (76.2 kg)    BP Readings from Last 3 Encounters:  09/07/23 110/70  07/29/23 126/67  05/14/23 108/60     Physical Exam Vitals and nursing note reviewed.  Constitutional:      Appearance: He is well-developed.  Cardiovascular:     Rate and Rhythm: Normal rate and regular rhythm.  Pulmonary:     Effort: Pulmonary effort is normal.     Breath sounds: Normal breath sounds.  Abdominal:     General: Bowel sounds are normal. There is no distension. There are no signs of injury.     Palpations: Abdomen is soft. There is no hepatomegaly or mass.     Tenderness: There is no abdominal tenderness.     Hernia: No hernia is present.  Musculoskeletal:       Arms:  Skin:    General: Skin is dry.     Findings: No erythema or rash.  Neurological:     General: No focal deficit present.     Mental Status: He is alert.  Psychiatric:        Mood and Affect: Mood normal.        Behavior: Behavior is cooperative.        Assessment & Plan:  Pain of skin  Acute right-sided low back pain without sciatica  Other orders -     predniSONE; Take one tablet po qAM x 5 days with food.  Dispense: 5 tablet; Refill: 0 -     valACYclovir HCl; Take 1 tablet (1,000 mg total) by mouth 3 (three) times daily. For 7 days for shingles.  Dispense: 21 tablet; Refill: 0  No obvious signs of shingles on exam today, could possibly be the start of it per his history. Will give prednisone as directed to help with pain and inflammation, which will also help with acute on chronic back pain. Start  valtrex as directed in the event this is  shingles developing. Pt to monitor signs and symptoms, recheck prn.   Return if symptoms worsen or fail to improve.    Kazoua Gossen M Gilberta Peeters, PA-C

## 2023-09-14 ENCOUNTER — Other Ambulatory Visit: Payer: Self-pay | Admitting: Family Medicine

## 2023-09-16 ENCOUNTER — Encounter: Payer: Self-pay | Admitting: Sports Medicine

## 2023-09-17 ENCOUNTER — Other Ambulatory Visit: Payer: Self-pay | Admitting: Sports Medicine

## 2023-09-17 DIAGNOSIS — G8929 Other chronic pain: Secondary | ICD-10-CM

## 2023-09-17 DIAGNOSIS — M47816 Spondylosis without myelopathy or radiculopathy, lumbar region: Secondary | ICD-10-CM

## 2023-09-17 DIAGNOSIS — M51362 Other intervertebral disc degeneration, lumbar region with discogenic back pain and lower extremity pain: Secondary | ICD-10-CM

## 2023-09-17 NOTE — Progress Notes (Unsigned)
New referral for epidural was placed

## 2023-09-17 NOTE — Telephone Encounter (Signed)
Epidural referral placed

## 2023-09-20 ENCOUNTER — Telehealth: Payer: Self-pay

## 2023-09-20 ENCOUNTER — Ambulatory Visit: Payer: Medicare PPO | Admitting: Physician Assistant

## 2023-09-20 ENCOUNTER — Encounter: Payer: Self-pay | Admitting: Physician Assistant

## 2023-09-20 VITALS — BP 118/70 | HR 74 | Ht 68.0 in | Wt 171.0 lb

## 2023-09-20 DIAGNOSIS — I480 Paroxysmal atrial fibrillation: Secondary | ICD-10-CM | POA: Diagnosis not present

## 2023-09-20 DIAGNOSIS — Z860101 Personal history of adenomatous and serrated colon polyps: Secondary | ICD-10-CM

## 2023-09-20 DIAGNOSIS — K760 Fatty (change of) liver, not elsewhere classified: Secondary | ICD-10-CM

## 2023-09-20 DIAGNOSIS — K219 Gastro-esophageal reflux disease without esophagitis: Secondary | ICD-10-CM

## 2023-09-20 MED ORDER — SUTAB 1479-225-188 MG PO TABS: 24.0000 | ORAL_TABLET | Freq: Once | ORAL | 0 refills | Status: AC

## 2023-09-20 NOTE — Progress Notes (Signed)
09/20/2023 [name] [id number] [birth year]  Referring provider: Shelva Majestic, MD Primary GI doctor: Dr. Lavon Paganini  ASSESSMENT AND PLAN:   History of adenomatous polyp of colon 04/23/2020 colonoscopy Dr. Lavon Paganini for screening purposes adequate bowel prep 2 mm polyp descending mild diverticulosis, 4 polyps 5 to 12 mm sigmoid transverse and ascending colon nonbleeding internal hemorrhoids recall 3 years Schedule colon at [facility] We have discussed the risks of bleeding, infection, perforation, medication reactions, and remote risk of death associated with colonoscopy. All questions were answered and the patient acknowledges these risk and wishes to proceed.  Paroxysmal atrial fibrillation (HCC) Hold Xarelto for 2 days before procedure  will instruct when and how to resume after procedure.  Patient understands that there is a low but real risk of cardiovascular event such as heart attack, stroke, or embolism /  thrombosis, or ischemia while off Xarelto.  The patient consents to proceed.  Will communicate by phone or EMR with patient's prescribing provider to confirm that holding Xarelto is reasonable in this case.   Fatty liver - need LFTs and CBC monitored every 6 months, revaluation every 2-3 years.  - Consider elastography --Continue to work on risk factor modification including diet exercise and control of risk factors including blood sugars.  Gastroesophageal reflux disease without esophagitis Lifestyle changes discussed, avoid NSAIDS, ETOH   Patient Care Team: Shelva Majestic, MD as PCP - General (Family Medicine) Marinus Maw, MD as PCP - Cardiology (Cardiology) Glendale Chard, DO as Consulting Physician (Neurology) Marinus Maw, MD as Consulting Physician (Cardiology) Pandora Leiter (Inactive) as Consulting Physician (Cardiology) Gospe, Ellene Route, MD as Consulting Physician (Ophthalmology)  HISTORY OF PRESENT ILLNESS: 77 y.o. male  referred by Shelva Majestic, MD presents for consultation of colonoscopy while on blood thinner. He has past medical history significant for nonrheumatic aortic valve stenosis, hyperlipidemia, history of CVA, nonischemic cardiomyopathy, coronary artery disease, sinus node dysfunction status post pacemaker, GERD A-fib/a flutter on Xarelto and others listed below.  04/23/2020 colonoscopy Dr. Lavon Paganini for screening purposes adequate bowel prep 2 mm polyp descending mild diverticulosis, 4 polyps 5 to 12 mm sigmoid transverse and ascending colon nonbleeding internal hemorrhoids recall 3 years  Patient denies  family history of colon cancer or other gastrointestinal malignancies. Patient has a BM every day or every other day.  Patient denies change in bowel habits, constipation, diarrhea, hematochezia.  Denies changes in appetite, unintentional weight loss.  Patient does not complain of GERD unless he eats something that causes a flare, well controlled with prilosec as needed.  Patient denies dysphagia, nausea, vomiting, melena.   CBC on 03/26/2023  WBC 9.7 HGB 14.9 MCV 90 Platelets 261  He denies NSAID use.  He reports ETOH use, weekends and social. He denies tobacco use.  He denies drug use.    He  reports that he quit smoking about 31 years ago. His smoking use included cigarettes. He started smoking about 51 years ago. He has a 20 pack-year smoking history. He has never used smokeless tobacco. He reports current alcohol use. He reports that he does not use drugs.  RELEVANT LABS AND IMAGING:  Results          CBC    Component Value Date/Time   WBC 9.7 03/26/2023 1002   WBC 7.7 03/08/2023 1005   RBC 5.04 03/26/2023 1002   RBC 5.02 03/08/2023 1005   HGB 14.9 03/26/2023 1002   HCT 45.5 03/26/2023 1002   PLT  261 03/26/2023 1002   MCV 90 03/26/2023 1002   MCH 29.6 03/26/2023 1002   MCH 29.7 08/01/2020 1025   MCHC 32.7 03/26/2023 1002   MCHC 33.3 03/08/2023 1005   RDW 14.4 03/26/2023  1002   LYMPHSABS 2.9 03/08/2023 1005   MONOABS 0.7 03/08/2023 1005   EOSABS 0.1 03/08/2023 1005   BASOSABS 0.0 03/08/2023 1005   Recent Labs    11/17/22 0935 03/08/23 1005 03/26/23 1002  HGB 14.9 14.9 14.9    CMP     Component Value Date/Time   NA 142 03/26/2023 1002   K 4.2 03/26/2023 1002   CL 105 03/26/2023 1002   CO2 20 03/26/2023 1002   GLUCOSE 89 03/26/2023 1002   GLUCOSE 119 (H) 03/08/2023 1005   GLUCOSE 101 (H) 11/03/2006 1051   BUN 20 03/26/2023 1002   CREATININE 1.09 03/26/2023 1002   CREATININE 1.29 (H) 08/01/2020 1025   CALCIUM 9.7 03/26/2023 1002   PROT 6.8 03/08/2023 1005   ALBUMIN 4.5 03/08/2023 1005   AST 15 03/08/2023 1005   ALT 10 03/08/2023 1005   ALKPHOS 65 03/08/2023 1005   BILITOT 0.6 03/08/2023 1005   GFRNONAA >60 05/26/2011 1530   GFRAA >60 05/26/2011 1530      Latest Ref Rng & Units 03/08/2023   10:05 AM 11/17/2022    9:35 AM 03/09/2022   10:14 AM  Hepatic Function  Total Protein 6.0 - 8.3 g/dL 6.8  7.0  6.9   Albumin 3.5 - 5.2 g/dL 4.5  4.5  4.5   AST 0 - 37 U/L 15  18  15    ALT 0 - 53 U/L 10  17  12    Alk Phosphatase 39 - 117 U/L 65  75  74   Total Bilirubin 0.2 - 1.2 mg/dL 0.6  0.8  0.6       Current Medications:   Current Outpatient Medications (Endocrine & Metabolic):    predniSONE (DELTASONE) 50 MG tablet, Take one tablet po qAM x 5 days with food.  Current Outpatient Medications (Cardiovascular):    benazepril (LOTENSIN) 5 MG tablet, Take 1 tablet (5 mg total) by mouth daily.   gemfibrozil (LOPID) 600 MG tablet, TAKE 1 TABLET BY MOUTH TWICE DAILY BEFORE  A  MEAL   icosapent Ethyl (VASCEPA) 1 g capsule, Take 1 g by mouth 2 (two) times daily.   metoprolol succinate (TOPROL-XL) 25 MG 24 hr tablet, Take 1 tablet (25 mg total) by mouth daily.   metoprolol tartrate (LOPRESSOR) 25 MG tablet, Take 1 tablet (25 mg total) by mouth as needed. Take 1 tablet (25 mg total) as needed for heart rate greater than 120   rosuvastatin (CRESTOR)  40 MG tablet, Take 1 tablet by mouth once daily   tadalafil (CIALIS) 5 MG tablet, TAKE ONE TABLET BY MOUTH DAILY AS NEEDED FOR ERECTILE DYSFUNCTION   Current Outpatient Medications (Analgesics):    indomethacin (INDOCIN) 25 MG capsule, TAKE 1 CAPSULE BY MOUTH TWICE DAILY WITH A MEAL AS NEEDED FOR PAIN. 3  DAYS  MAXIMUM  Current Outpatient Medications (Hematological):    XARELTO 20 MG TABS tablet, TAKE 1 TABLET BY MOUTH ONCE DAILY WITH SUPPER  Current Outpatient Medications (Other):    cyclobenzaprine (FLEXERIL) 5 MG tablet, Take 1 tablet (5 mg total) by mouth 3 (three) times daily as needed for muscle spasms.   Multiple Vitamin (MULTIVITAMIN) tablet, Take 1 tablet by mouth daily.   omeprazole (PRILOSEC) 20 MG capsule, Take 1 capsule by mouth once daily  tamsulosin (FLOMAX) 0.4 MG CAPS capsule, Take 1 capsule by mouth once daily   valACYclovir (VALTREX) 1000 MG tablet, Take 1 tablet (1,000 mg total) by mouth 3 (three) times daily. For 7 days for shingles.   zolpidem (AMBIEN) 5 MG tablet, Take 1 tablet (5 mg total) by mouth at bedtime.  Medical History:  Past Medical History:  Diagnosis Date   Abscess    right posterior neck   Anticoagulation adequate 06/07/2019   Overview:  SSS, paroxAFib/Flutter, CHADSvasc= 3 age, h/o cva; chronic OAC Xarelto   Atrial fibrillation and flutter (HCC) 01/01/2015   Noted 2016. Xarelto    BPH associated with nocturia 08/14/2014   Finasteride, flomax.  0-3x nocturia. PSA to 14-->eval urology [facility]. PSA trended back down to about 2.6 per records in 2015 then placed on finasteride- had significant voiding issues when spiked to 14- improved on meds    Bradycardia    CAD (coronary artery disease) 05/26/2016   Coronary CTA_ 50-75% D1 off CT report    Cardiac pacemaker in situ 12/28/2009   SA node dysfunction as cause    Chest pressure 12/01/2015   Overview:  atypical CP, [facility] nuclear perfusion imaging w/ no inarct, no ischemia, EF-40% NICM   Chronic systolic heart  failure (HCC) 04/02/980   Overview:  [facility] nuclear perfusion imaging study, no inarct, no ischemia, EF-40% NICM   CVA (cerebral vascular accident) (HCC)    Double vision 07/04/2018   Elevated LFTs    Erectile dysfunction 01/30/2015   cialis prn    External hemorrhoids    Fitting or adjustment of cardiac pacemaker 06/07/2019   Overview:  Biotronik [place] DR [id number], RA and RV leads [id number]:  [id number] and [id number]   Former smoker 01/30/2015   Former smoker. 20 pack years quit 92. Patient declined AAA screen.     GERD (gastroesophageal reflux disease)    Gout 11/11/2009   Qualifier: Diagnosis of  By: Oswald Hillock     History of nuclear stress test 06/07/2019   Overview:  [facility], no inarct, no ischemia, EF-40% NICM   HTN (hypertension)    Hyperglycemia 06/10/2017   110s CBG 2018   Hyperlipidemia 07/12/2007   Crestor 20mg  daily with some myalgias; fish oil. Trig 200-500 despite this. LDL ok.        Insomnia 01/30/2015   ambien 5mg  prn. May refill.     Ischemic optic neuropathy    Left chest pressure 03/19/2015   Other and unspecified hyperlipidemia    Sinoatrial node dysfunction (HCC)    Spinal stenosis    Trigger finger, right middle finger 03/01/2018   Allergies: No Known Allergies   Surgical History:  He  has a past surgical history that includes Cardiac catheterization (1996); Cholecystectomy (02/07/2003); Colonoscopy (06/23/2004); Pacemaker insertion; Eye surgery; Abscess drainage; left ingunial hernia (06/08/2011); and right hip replacement. Family History:  His family history includes Brain cancer in his mother; Cancer in his brother; Diabetes in his father and paternal grandfather; Heart attack in his father; Heart attack (age of onset: 61) in his mother; Hypertension in his mother; Lung cancer in his mother.  REVIEW OF SYSTEMS  : All other systems reviewed and negative except where noted in the History of Present Illness.  PHYSICAL EXAM: BP 118/70 (BP Location: Left Arm, Patient  Position: Sitting, Cuff Size: Normal)   Pulse 74   Ht 5\' 8"  (1.727 m)   Wt 171 lb (77.6 kg)   BMI 26.00 kg/m  General Appearance: Well nourished, in no apparent  distress. Head:   Normocephalic and atraumatic. Eyes:  sclerae anicteric,conjunctive pink  Respiratory: Respiratory effort normal, BS equal bilaterally without rales, rhonchi, wheezing. Cardio: RRR with no MRGs. Peripheral pulses intact.  Abdomen: Soft,  Obese ,active bowel sounds. No tenderness . Without guarding and Without rebound. No masses. Rectal: Not evaluated Musculoskeletal: Full ROM, Normal gait. Without edema. Skin:  Dry and intact without significant lesions or rashes Neuro: Alert and  oriented x4;  No focal deficits. Psych:  Cooperative. Normal mood and affect.    Doree Albee, PA-C 1:49 PM

## 2023-09-20 NOTE — Telephone Encounter (Signed)
Patient with diagnosis of afib on Xarelto for anticoagulation.    Procedure: colonoscopy Date of procedure: 11/02/23  CHA2DS2-VASc Score = 7  This indicates a 11.2% annual risk of stroke. The patient's score is based upon: CHF History: 1 HTN History: 1 Diabetes History: 0 Stroke History: 2 Vascular Disease History: 1 Age Score: 2 Gender Score: 0   Stroke in 1996 post-cath  CrCl 61mL/min Platelet count 261K  Ok for patient to hold Xarelto for 2 days prior to procedure as requested.    **This guidance is not considered finalized until pre-operative APP has relayed final recommendations.**

## 2023-09-20 NOTE — Telephone Encounter (Signed)
Name: [name]  DOB: 1946-08-26  MRN: [id number]  Primary Cardiologist: [name], MD   Preoperative team, please contact this patient and set up a phone call appointment for further preoperative risk assessment. Please obtain consent and complete medication review. Thank you for your help.  I confirm that guidance regarding antiplatelet and oral anticoagulation therapy has been completed and, if necessary, noted below.  Ok for patient to hold Xarelto for 2 days prior to procedure as requested.   I also confirmed the patient resides in the state of [place]. As per [facility] telemedicine laws, the patient must reside in the state in which the provider is licensed.   Ronney Asters, NP 09/20/2023, 3:20 PM [facility]

## 2023-09-20 NOTE — Patient Instructions (Addendum)
hemorrhoidal skin tag is benign, can use witch hazel.   You have been scheduled for a colonoscopy. Please follow written instructions given to you at your visit today.   Please pick up your prep supplies at the pharmacy within the next 1-3 days.  If you use inhalers (even only as needed), please bring them with you on the day of your procedure.  DO NOT TAKE 7 DAYS PRIOR TO TEST- Trulicity (dulaglutide) Ozempic, Wegovy (semaglutide) Mounjaro (tirzepatide) Bydureon Bcise (exanatide extended release)  DO NOT TAKE 1 DAY PRIOR TO YOUR TEST Rybelsus (semaglutide) Adlyxin (lixisenatide) Victoza (liraglutide) Byetta (exanatide) ___________________________________________________________________________ _______________________________________________________  If your blood pressure at your visit was 140/90 or greater, please contact your primary care physician to follow up on this.  _______________________________________________________  If you are age 62 or older, your body mass index should be between 23-30. Your Body mass index is 26 kg/m. If this is out of the aforementioned range listed, please consider follow up with your Primary Care Provider.  If you are age 86 or younger, your body mass index should be between 19-25. Your Body mass index is 26 kg/m. If this is out of the aformentioned range listed, please consider follow up with your Primary Care Provider.   ________________________________________________________  The [place] [facility] providers would like to encourage you to use [facility] to communicate with providers for non-urgent requests or questions.  Due to long hold times on the telephone, sending your provider a message by [facility] may be a faster and more efficient way to get a response.  Please allow 48 business hours for a response.  Please remember that this is for non-urgent requests.  _______________________________________________________

## 2023-09-20 NOTE — Telephone Encounter (Signed)
Pt has appt 10/06/23 with Dr. Ladona Ridgel. Will d/w pre op APP if ok to defer to MD appt as procedure is not until 11/02/23

## 2023-09-20 NOTE — Telephone Encounter (Signed)
[facility] Pre-operative Risk Assessment     Request for surgical clearance:     Endoscopy Procedure  What type of surgery is being performed?     Colonoscopy  When is this surgery scheduled?     11/02/2023  What type of clearance is required ?   Pharmacy  Are there any medications that need to be held prior to surgery and how long? Xarelto - 2 days  Practice name and name of physician performing surgery?      [facility]  What is your office phone and fax number?      Phone- [phone]  Fax- [phone]  Anesthesia type (None, local, MAC, general) ?       MAC

## 2023-09-21 NOTE — Telephone Encounter (Signed)
Will update all parties involved the pt has appt with Dr. Ladona Ridgel. Per pre op APP ok to defer clearance to appt with Dr. Ladona Ridgel.

## 2023-09-22 NOTE — Telephone Encounter (Signed)
Ok to do preop 11/24.

## 2023-09-22 NOTE — Telephone Encounter (Signed)
Thank you Dr. Ladona Ridgel for your time for Mr. Beales for pre op clearance.

## 2023-09-22 NOTE — Telephone Encounter (Signed)
Thank you, Zella Ball please inform patient okay to hold Xarelto for 2 days prior to the procedure per cardiology.

## 2023-09-28 NOTE — Discharge Instructions (Signed)
Post Procedure Spinal Discharge Instruction Sheet ? ?You may resume a regular diet and any medications that you routinely take (including pain medications) unless otherwise noted by MD. ? ?No driving day of procedure. ? ?Light activity throughout the rest of the day.  Do not do any strenuous work, exercise, bending or lifting.  The day following the procedure, you can resume normal physical activity but you should refrain from exercising or physical therapy for at least three days thereafter. ? ?You may apply ice to the injection site, 20 minutes on, 20 minutes off, as needed. Do not apply ice directly to skin.  ? ? ?Common Side Effects: ? ?Headaches- take your usual medications as directed by your physician.  Increase your fluid intake.  Caffeinated beverages may be helpful.  Lie flat in bed until your headache resolves. ? ?Restlessness or inability to sleep- you may have trouble sleeping for the next few days.  Ask your referring physician if you need any medication for sleep. ? ?Facial flushing or redness- should subside within a few days. ? ?Increased pain- a temporary increase in pain a day or two following your procedure is not unusual.  Take your pain medication as prescribed by your referring physician. ? ?Leg cramps ? ?Please contact our office at [phone] for the following symptoms: ?Fever greater than 100 degrees. ?Headaches unresolved with medication after 2-3 days. ?Increased swelling, pain, or redness at injection site. ? ? ?Thank you for visiting [facility] today.   ? ?YOU MAY RESUME YOUR XARELTO IN 24 HOURS.  ?

## 2023-09-29 ENCOUNTER — Ambulatory Visit
Admission: RE | Admit: 2023-09-29 | Discharge: 2023-09-29 | Disposition: A | Discharge status: Home / Self Care | Payer: Medicare PPO | Source: Ambulatory Visit | Attending: Sports Medicine | Admitting: Sports Medicine

## 2023-09-29 DIAGNOSIS — M51362 Other intervertebral disc degeneration, lumbar region with discogenic back pain and lower extremity pain: Secondary | ICD-10-CM

## 2023-09-29 DIAGNOSIS — G8929 Other chronic pain: Secondary | ICD-10-CM

## 2023-09-29 DIAGNOSIS — M4726 Other spondylosis with radiculopathy, lumbar region: Secondary | ICD-10-CM | POA: Diagnosis not present

## 2023-09-29 DIAGNOSIS — M47816 Spondylosis without myelopathy or radiculopathy, lumbar region: Secondary | ICD-10-CM

## 2023-09-29 DIAGNOSIS — M48061 Spinal stenosis, lumbar region without neurogenic claudication: Secondary | ICD-10-CM | POA: Diagnosis not present

## 2023-09-29 MED ORDER — IOPAMIDOL (ISOVUE-M 200) INJECTION 41%
1.0000 mL | Freq: Once | INTRAMUSCULAR | Status: AC
  Administered 2023-09-29: 1 mL via EPIDURAL

## 2023-09-29 MED ORDER — METHYLPREDNISOLONE ACETATE 40 MG/ML INJ SUSP (RADIOLOG
80.0000 mg | Freq: Once | INTRAMUSCULAR | Status: AC
  Administered 2023-09-29: 80 mg via EPIDURAL

## 2023-09-30 NOTE — Telephone Encounter (Signed)
Patient knew to hold Xarelto for 2 days prior to procedure.

## 2023-10-06 ENCOUNTER — Encounter: Payer: Self-pay | Admitting: Internal Medicine

## 2023-10-06 ENCOUNTER — Ambulatory Visit: Payer: Medicare PPO | Attending: Internal Medicine | Admitting: Internal Medicine

## 2023-10-06 VITALS — BP 140/78 | HR 67 | Ht 68.0 in | Wt 168.0 lb

## 2023-10-06 DIAGNOSIS — I480 Paroxysmal atrial fibrillation: Secondary | ICD-10-CM

## 2023-10-06 NOTE — Progress Notes (Signed)
HPI [name] returns today for followup. He is a pleasant 77 yo man with sinus node dysfunction, s/p PPM insertion. He has non-obstructive CAD. He has dyslipidemia and is on statin therapy. In the interim, he notes he feels well and has gone back to swimming. He denies palpitations. He notes that his HR gets into the 120 range at times for no clear cut reason.  He has occaisional palpitations. He also notes that at times his heart rate will go from 140 down to 65/min.  No Known Allergies   Current Outpatient Medications  Medication Sig Dispense Refill   benazepril (LOTENSIN) 5 MG tablet Take 1 tablet (5 mg total) by mouth daily. 90 tablet 3   cyclobenzaprine (FLEXERIL) 5 MG tablet Take 1 tablet (5 mg total) by mouth 3 (three) times daily as needed for muscle spasms. 30 tablet 2   gemfibrozil (LOPID) 600 MG tablet TAKE 1 TABLET BY MOUTH TWICE DAILY BEFORE  A  MEAL 180 tablet 3   icosapent Ethyl (VASCEPA) 1 g capsule Take 1 g by mouth 2 (two) times daily.     indomethacin (INDOCIN) 25 MG capsule TAKE 1 CAPSULE BY MOUTH TWICE DAILY WITH A MEAL AS NEEDED FOR PAIN. 3  DAYS  MAXIMUM 30 capsule 0   metoprolol succinate (TOPROL-XL) 25 MG 24 hr tablet Take 1 tablet (25 mg total) by mouth daily. 90 tablet 3   metoprolol tartrate (LOPRESSOR) 25 MG tablet Take 1 tablet (25 mg total) by mouth as needed. Take 1 tablet (25 mg total) as needed for heart rate greater than 120 45 tablet 3   Multiple Vitamin (MULTIVITAMIN) tablet Take 1 tablet by mouth daily.     omeprazole (PRILOSEC) 20 MG capsule Take 1 capsule by mouth once daily 90 capsule 0   predniSONE (DELTASONE) 50 MG tablet Take one tablet po qAM x 5 days with food. 5 tablet 0   rosuvastatin (CRESTOR) 40 MG tablet Take 1 tablet by mouth once daily 90 tablet 0   tadalafil (CIALIS) 5 MG tablet TAKE ONE TABLET BY MOUTH DAILY AS NEEDED FOR ERECTILE DYSFUNCTION 90 tablet 3   tamsulosin (FLOMAX) 0.4 MG CAPS capsule Take 1 capsule by mouth once  daily 90 capsule 0   valACYclovir (VALTREX) 1000 MG tablet Take 1 tablet (1,000 mg total) by mouth 3 (three) times daily. For 7 days for shingles. 21 tablet 0   XARELTO 20 MG TABS tablet TAKE 1 TABLET BY MOUTH ONCE DAILY WITH SUPPER 90 tablet 2   zolpidem (AMBIEN) 5 MG tablet Take 1 tablet (5 mg total) by mouth at bedtime. 90 tablet 1   No current facility-administered medications for this visit.     Past Medical History:  Diagnosis Date   Abscess    right posterior neck   Anticoagulation adequate 06/07/2019   Overview:  SSS, paroxAFib/Flutter, CHADSvasc= 3 age, h/o cva; chronic OAC Xarelto   Atrial fibrillation and flutter (HCC) 01/01/2015   Noted 2016. Xarelto    BPH associated with nocturia 08/14/2014   Finasteride, flomax.  0-3x nocturia. PSA to 14-->eval urology [facility]. PSA trended back down to about 2.6 per records in 2015 then placed on finasteride- had significant voiding issues when spiked to 14- improved on meds    Bradycardia    CAD (coronary artery disease) 05/26/2016   Coronary CTA_ 50-75% D1 off CT report    Cardiac pacemaker in situ 12/28/2009   SA node dysfunction as cause    Chest pressure  12/01/2015   Overview:  atypical CP, [facility] nuclear perfusion imaging w/ no inarct, no ischemia, EF-40% NICM   Chronic systolic heart failure (HCC) 12/01/2015   Overview:  [facility] nuclear perfusion imaging study, no inarct, no ischemia, EF-40% NICM   CVA (cerebral vascular accident) (HCC)    Double vision 07/04/2018   Elevated LFTs    Erectile dysfunction 01/30/2015   cialis prn    External hemorrhoids    Fitting or adjustment of cardiac pacemaker 06/07/2019   Overview:  Biotronik [place] DR [id number], RA and RV leads [id number]:  [id number] and [id number]   Former smoker 01/30/2015   Former smoker. 20 pack years quit 92. Patient declined AAA screen.     GERD (gastroesophageal reflux disease)    Gout 11/11/2009   Qualifier: Diagnosis of  By: Oswald Hillock     History of nuclear stress test 06/07/2019    Overview:  [facility], no inarct, no ischemia, EF-40% NICM   HTN (hypertension)    Hyperglycemia 06/10/2017   110s CBG 2018   Hyperlipidemia 07/12/2007   Crestor 20mg  daily with some myalgias; fish oil. Trig 200-500 despite this. LDL ok.        Insomnia 01/30/2015   ambien 5mg  prn. May refill.     Ischemic optic neuropathy    Left chest pressure 03/19/2015   Other and unspecified hyperlipidemia    Sinoatrial node dysfunction (HCC)    Spinal stenosis    Trigger finger, right middle finger 03/01/2018    ROS:   All systems reviewed and negative except as noted in the HPI.   Past Surgical History:  Procedure Laterality Date   ABSCESS DRAINAGE     righ tposterior neck   CARDIAC CATHETERIZATION  1996   CHOLECYSTECTOMY  02/07/2003   COLONOSCOPY  06/23/2004   EYE SURGERY     left eye   left ingunial hernia  06/08/2011   PACEMAKER INSERTION     right hip replacement     2012 Dr. Despina Hick     Family History  Problem Relation Age of Onset   Lung cancer Mother        mets to brain   Heart attack Mother 56   Hypertension Mother    Brain cancer Mother    Heart attack Father        mid 11s, smoker.    Diabetes Father    Cancer Brother    Diabetes Paternal Grandfather    Colon cancer Neg Hx    Esophageal cancer Neg Hx    Stomach cancer Neg Hx    Rectal cancer Neg Hx      Social History   Socioeconomic History   Marital status: Married    Spouse name: Not on file   Number of children: 1   Years of education: 20   Highest education level: Not on file  Occupational History   Not on file  Tobacco Use   Smoking status: Former    Current packs/day: 0.00    Average packs/day: 1 pack/day for 20.0 years (20.0 ttl pk-yrs)    Types: Cigarettes    Start date: 11/04/1971    Quit date: 11/04/1991    Years since quitting: 31.9   Smokeless tobacco: Never  Vaping Use   Vaping status: Never Used  Substance and Sexual Activity   Alcohol use: Yes    Alcohol/week: 0.0 standard drinks of  alcohol    Comment: occ   Drug use: No   Sexual activity: Not on file  Other  Topics Concern   Not on file  Social History Narrative   Married. 1 child and 1 step child. 2 grandchildren.       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      Hobbies: swimming (states not fun), travel      Lives in a single story home   Social Determinants of Health   Financial Resource Strain: Low Risk  (02/22/2023)   Overall Financial Resource Strain (CARDIA)    Difficulty of Paying Living Expenses: Not hard at all  Food Insecurity: No Food Insecurity (02/22/2023)   Hunger Vital Sign    Worried About Running Out of Food in the Last Year: Never true    Ran Out of Food in the Last Year: Never true  Transportation Needs: No Transportation Needs (02/22/2023)   PRAPARE - Administrator, Civil Service (Medical): No    Lack of Transportation (Non-Medical): No  Physical Activity: Sufficiently Active (02/22/2023)   Exercise Vital Sign    Days of Exercise per Week: 4 days    Minutes of Exercise per Session: 60 min  Stress: No Stress Concern Present (02/22/2023)   [employer] of Occupational Health - Occupational Stress Questionnaire    Feeling of Stress : Not at all  Social Connections: Moderately Integrated (02/22/2023)   Social Connection and Isolation Panel [NHANES]    Frequency of Communication with Friends and Family: More than three times a week    Frequency of Social Gatherings with Friends and Family: More than three times a week    Attends Religious Services: Never    Database administrator or Organizations: Yes    Attends Banker Meetings: 1 to 4 times per year    Marital Status: Married  Catering manager Violence: Not At Risk (02/22/2023)   Humiliation, Afraid, Rape, and Kick questionnaire    Fear of Current or Ex-Partner: No    Emotionally Abused: No    Physically Abused: No    Sexually Abused: No     BP (!) 140/78   Pulse 67   Ht 5\' 8"  (1.727 m)   Wt 168 lb (76.2 kg)    SpO2 98%   BMI 25.54 kg/m   Physical Exam:  Well appearing NAD HEENT: Unremarkable Neck:  No JVD, no thyromegally Lymphatics:  No adenopathy Back:  No CVA tenderness Lungs:  Clear with no wheezes HEART:  Regular rate rhythm, no murmurs, no rubs, no clicks Abd:  soft, positive bowel sounds, no organomegally, no rebound, no guarding Ext:  2 plus pulses, no edema, no cyanosis, no clubbing Skin:  No rashes no nodules Neuro:  CN II through XII intact, motor grossly intact  EKG - AV pacing  DEVICE  Normal device function.  See PaceArt for details.   Assess/Plan: Sinus node dysfunction - he is s/p PPM insertion and about 9 months to ERI. We reprogrammed his device to try to improve his rate response and prevent the drops in his HR. Could be PVC's. I had him walk in the halls with me and his HR increased appropriately into the 90's. We turned CLS off and he has standard rate response. Non-obstructive CAD - he denies anginal symptom We will follow. Dyslipidemia - continue stain therapy. Afib/flutter - he will continue xarelto. His rates are controlled   Dorathy Daft

## 2023-10-06 NOTE — Patient Instructions (Addendum)
Medication Instructions:  Your physician recommends that you continue on your current medications as directed. Please refer to the Current Medication list given to you today.  *If you need a refill on your cardiac medications before your next appointment, please call your pharmacy*  Lab Work: None ordered.  If you have labs (blood work) drawn today and your tests are completely normal, you will receive your results only by: MyChart Message (if you have MyChart) OR A paper copy in the mail If you have any lab test that is abnormal or we need to change your treatment, we will call you to review the results.  Testing/Procedures: None ordered.  Follow-Up: At [name], you and your health needs are our priority.  As part of our continuing mission to provide you with exceptional heart care, we have created designated Provider Care Teams.  These Care Teams include your primary Cardiologist (physician) and Advanced Practice Providers (APPs -  Physician Assistants and Nurse Practitioners) who all work together to provide you with the care you need, when you need it.   Your next appointment:   6 months  The format for your next appointment:   In Person  Provider:   [name], MD{or one of the following Advanced Practice Providers on your designated Care Team:   [name], [name] "[name]" [name], [name], NP  Remote monitoring is used to monitor your Pacemaker/ ICD from home. This monitoring reduces the number of office visits required to check your device to one time per year. It allows [place] to keep an eye on the functioning of your device to ensure it is working properly.   Important Information About Sugar

## 2023-10-08 ENCOUNTER — Ambulatory Visit: Payer: Medicare PPO | Admitting: Family Medicine

## 2023-10-08 ENCOUNTER — Encounter: Payer: Self-pay | Admitting: Family Medicine

## 2023-10-08 VITALS — BP 128/84 | HR 60 | Temp 97.9°F | Ht 68.0 in | Wt 166.2 lb

## 2023-10-08 DIAGNOSIS — E785 Hyperlipidemia, unspecified: Secondary | ICD-10-CM | POA: Diagnosis not present

## 2023-10-08 DIAGNOSIS — I2510 Atherosclerotic heart disease of native coronary artery without angina pectoris: Secondary | ICD-10-CM | POA: Diagnosis not present

## 2023-10-08 DIAGNOSIS — I10 Essential (primary) hypertension: Secondary | ICD-10-CM | POA: Diagnosis not present

## 2023-10-08 DIAGNOSIS — R739 Hyperglycemia, unspecified: Secondary | ICD-10-CM | POA: Diagnosis not present

## 2023-10-08 DIAGNOSIS — I5022 Chronic systolic (congestive) heart failure: Secondary | ICD-10-CM

## 2023-10-08 DIAGNOSIS — Z131 Encounter for screening for diabetes mellitus: Secondary | ICD-10-CM

## 2023-10-08 LAB — COMPREHENSIVE METABOLIC PANEL
ALT: 15 U/L (ref 0–53)
AST: 15 U/L (ref 0–37)
Albumin: 4.6 g/dL (ref 3.5–5.2)
Alkaline Phosphatase: 76 U/L (ref 39–117)
BUN: 27 mg/dL — ABNORMAL HIGH (ref 6–23)
CO2: 28 meq/L (ref 19–32)
Calcium: 9.6 mg/dL (ref 8.4–10.5)
Chloride: 102 meq/L (ref 96–112)
Creatinine, Ser: 1.32 mg/dL (ref 0.40–1.50)
GFR: 52.22 mL/min — ABNORMAL LOW (ref >60.00)
Glucose, Bld: 109 mg/dL — ABNORMAL HIGH (ref 70–99)
Potassium: 3.7 meq/L (ref 3.5–5.1)
Sodium: 138 meq/L (ref 135–145)
Total Bilirubin: 0.9 mg/dL (ref 0.2–1.2)
Total Protein: 7.4 g/dL (ref 6.0–8.3)

## 2023-10-08 LAB — CBC WITH DIFFERENTIAL/PLATELET
Basophils Absolute: 0 10*3/uL (ref 0.0–0.1)
Basophils Relative: 0.4 % (ref 0.0–3.0)
Eosinophils Absolute: 0.4 10*3/uL (ref 0.0–0.7)
Eosinophils Relative: 4.6 % (ref 0.0–5.0)
HCT: 44 % (ref 39.0–52.0)
Hemoglobin: 14.5 g/dL (ref 13.0–17.0)
Lymphocytes Relative: 26.2 % (ref 12.0–46.0)
Lymphs Abs: 2.4 10*3/uL (ref 0.7–4.0)
MCHC: 33 g/dL (ref 30.0–36.0)
MCV: 91.8 fL (ref 78.0–100.0)
Monocytes Absolute: 0.8 10*3/uL (ref 0.1–1.0)
Monocytes Relative: 8.6 % (ref 3.0–12.0)
Neutro Abs: 5.6 10*3/uL (ref 1.4–7.7)
Neutrophils Relative %: 60.2 % (ref 43.0–77.0)
Platelets: 317 10*3/uL (ref 150.0–400.0)
RBC: 4.79 Mil/uL (ref 4.22–5.81)
RDW: 14.9 % (ref 11.5–15.5)
WBC: 9.3 10*3/uL (ref 4.0–10.5)

## 2023-10-08 LAB — HEMOGLOBIN A1C: Hgb A1c MFr Bld: 6.4 % (ref 4.6–6.5)

## 2023-10-08 MED ORDER — TRAMADOL HCL 50 MG PO TABS
50.0000 mg | ORAL_TABLET | Freq: Four times a day (QID) | ORAL | 0 refills | Status: DC | PRN
Start: 2023-10-08 — End: 2024-02-22

## 2023-10-08 NOTE — Patient Instructions (Addendum)
Please stop by lab before you go If you have mychart- we will send your results within 3 business days of [place] receiving them.  If you do not have mychart- we will call you about results within 5 business days of [place] receiving them.  *please also note that you will see labs on mychart as soon as they post. I will later go in and write notes on them- will say "notes from Dr. Durene Cal"   No changes today unless labs lead [place] to make changes   Recommended follow up: Return in about 6 months (around 04/06/2024) for physical or sooner if needed.Schedule b4 you leave.

## 2023-10-08 NOTE — Progress Notes (Signed)
Phone [phone] In person visit   Subjective:   [name] is a 77 y.o. year old very pleasant male patient who presents for/with See problem oriented charting Chief Complaint  Patient presents with   Hypertension    Colonoscopy is scheduled for Dec.   Hyperlipidemia   Insomnia   Past Medical History-  Patient Active Problem List   Diagnosis Date Noted   Paroxysmal atrial fibrillation (HCC) 07/27/2019    Priority: High   S/P cardiac cath 06/07/2019    Priority: High   CAD (coronary artery disease) 05/26/2016    Priority: High   Nonischemic cardiomyopathy (HCC) 04/23/2016    Priority: High   Chronic systolic heart failure (HCC) 12/01/2015    Priority: High   Cerebral infarction (HCC) 01/30/2015    Priority: High   Atrial fibrillation and flutter (HCC) 01/01/2015    Priority: High   Ventricular tachycardia, non-sustained (HCC) 01/25/2013    Priority: High   Cardiac pacemaker in situ 12/28/2009    Priority: High   Aortic atherosclerosis (HCC) 08/01/2020    Priority: Medium    Fatty liver 08/01/2020    Priority: Medium    Double vision 07/04/2018    Priority: Medium    Hyperglycemia 06/10/2017    Priority: Medium    Insomnia 01/30/2015    Priority: Medium    BPH associated with nocturia 08/14/2014    Priority: Medium    Gout 11/11/2009    Priority: Medium    Hyperlipidemia 07/12/2007    Priority: Medium    NEUROPATHY, ISCHEMIC OPTIC 06/02/2007    Priority: Medium    Essential hypertension 06/02/2007    Priority: Medium    Anticoagulation adequate 06/07/2019    Priority: Low   Convergence insufficiency 06/07/2019    Priority: Low   Fitting or adjustment of cardiac pacemaker 06/07/2019    Priority: Low   Pacemaker-dependent due to native cardiac rhythm insufficient to support life 06/07/2019    Priority: Low   Palpitations 06/07/2019    Priority: Low   History of nuclear stress test 06/07/2019    Priority: Low   Non-occlusive coronary artery  disease 06/07/2019    Priority: Low   Trigger finger, right middle finger 03/01/2018    Priority: Low   Solitary pulmonary nodule 05/26/2016    Priority: Low   Chest pressure 12/01/2015    Priority: Low   Left chest pressure 03/19/2015    Priority: Low   Erectile dysfunction 01/30/2015    Priority: Low   Former smoker 01/30/2015    Priority: Low   Allergic rhinitis 10/11/2014    Priority: Low   Sinoatrial node dysfunction (HCC)     Priority: Low   SPINAL STENOSIS, LUMBAR 08/22/2010    Priority: Low   GERD 03/17/2010    Priority: Low    Medications- reviewed and updated Current Outpatient Medications  Medication Sig Dispense Refill   benazepril (LOTENSIN) 5 MG tablet Take 1 tablet (5 mg total) by mouth daily. 90 tablet 3   cyclobenzaprine (FLEXERIL) 5 MG tablet Take 1 tablet (5 mg total) by mouth 3 (three) times daily as needed for muscle spasms. 30 tablet 2   gemfibrozil (LOPID) 600 MG tablet TAKE 1 TABLET BY MOUTH TWICE DAILY BEFORE  A  MEAL 180 tablet 3   icosapent Ethyl (VASCEPA) 1 g capsule Take 1 g by mouth 2 (two) times daily.     indomethacin (INDOCIN) 25 MG capsule TAKE 1 CAPSULE BY MOUTH TWICE DAILY WITH A MEAL AS NEEDED  FOR PAIN. 3  DAYS  MAXIMUM 30 capsule 0   metoprolol succinate (TOPROL-XL) 25 MG 24 hr tablet Take 1 tablet (25 mg total) by mouth daily. 90 tablet 3   metoprolol tartrate (LOPRESSOR) 25 MG tablet Take 1 tablet (25 mg total) by mouth as needed. Take 1 tablet (25 mg total) as needed for heart rate greater than 120 45 tablet 3   Multiple Vitamin (MULTIVITAMIN) tablet Take 1 tablet by mouth daily.     omeprazole (PRILOSEC) 20 MG capsule Take 1 capsule by mouth once daily 90 capsule 0   rosuvastatin (CRESTOR) 40 MG tablet Take 1 tablet by mouth once daily 90 tablet 0   tadalafil (CIALIS) 5 MG tablet TAKE ONE TABLET BY MOUTH DAILY AS NEEDED FOR ERECTILE DYSFUNCTION 90 tablet 3   tamsulosin (FLOMAX) 0.4 MG CAPS capsule Take 1 capsule by mouth once daily 90  capsule 0   traMADol (ULTRAM) 50 MG tablet Take 1 tablet (50 mg total) by mouth every 6 (six) hours as needed for moderate pain (pain score 4-6) or severe pain (pain score 7-10). 20 tablet 0   valACYclovir (VALTREX) 1000 MG tablet Take 1 tablet (1,000 mg total) by mouth 3 (three) times daily. For 7 days for shingles. 21 tablet 0   XARELTO 20 MG TABS tablet TAKE 1 TABLET BY MOUTH ONCE DAILY WITH SUPPER 90 tablet 2   zolpidem (AMBIEN) 5 MG tablet Take 1 tablet (5 mg total) by mouth at bedtime. 90 tablet 1   No current facility-administered medications for this visit.     Objective:  BP 128/84   Pulse 60   Temp 97.9 F (36.6 C)   Ht 5\' 8"  (1.727 m)   Wt 166 lb 3.2 oz (75.4 kg)   SpO2 96%   BMI 25.27 kg/m  Gen: NAD, resting comfortably CV: RRR no murmurs rubs or gallops Lungs: CTAB no crackles, wheeze, rhonchi Ext: no edema Skin: warm, dry     Assessment and Plan   # Health maintenance-colonoscopy scheduled for December  # Back pain-seen 09/07/2023 here and started on prednisone and ultimately had epidural set up through Dr. Jean Rosenthal on 09/29/23- so far has not seen much relief though but only a week out- hoping another week or two will help more. Feels older when dealing with this.   #CV issues 1.  Coronary artery disease-compliant with Xarelto 20 mg-not on aspirin as a result of Xarelto. No chest pain (mild discomfort if heart rate goes up) or shortness of breath. Asymptomatic continue current medications including statin 2.  Hyperlipidemia-compliant with rosuvastatin 40 mg with LDL goal 70 or less.  Also takes gemfibrozil and vascepa 1g twice daily - also sees [facility] Lab Results  Component Value Date   CHOL 131 03/08/2023   HDL 41.10 03/08/2023   LDLCALC 53 03/08/2023   LDLDIRECT 70.0 11/16/2019   TRIG 187.0 (H) 03/08/2023   CHOLHDL 3 03/08/2023  3.  Nonischemic cardiomyopathy/systolic heart failure -sees [facility].  Compliant with benazepril 5 mg metoprolol 25 mg extended release.   Has not required Lasix  4. sinus node dysfunction with pacemaker in place- working with Dr. Ladona Ridgel for some heart rate variability issues recently 5.  Atrial fibrillation and flutter-on chronic Xarelto and metoprolol 25 extended release with additional 25 mg instant release if needed 6.  Aortic atherosclerosis-on statin and Xarelto A/P: all cardiac issues are stable as noted above- continue current medications    #Insomnia S: Medication: Ambien 5 mg.  Denies falls/abnormal dreams/sleepwalking/trouble driving  next day  A/P: using this 2 days a week- knows to avoid it within 6 hours of tramadol trial     #BPH S: medication(s) :  Flomax 0.4 mg - prior:  finasteride 5 mg but affected libido- no worsening off of it A/P: stable- continue current medicines   -wants to check PSA next visit   #Arthritis-minimizes his use due to xarelto  S: Patient on indomethacin 25 mg twice daily for 3 days maximum per week with meal - more recently one every 2-3 days just to tolerate the back pain A/P: ongoing back pain issues - he is aware of gastroenterology bleeding risks but indomethacin makes pain tolerable- discussed tramadol    # Hyperglycemia/insulin resistance/prediabetes-peak A1c 5.9 S:  Medication: none Lab Results  Component Value Date   HGBA1C 6.0 11/17/2022   HGBA1C 6.1 03/09/2022   HGBA1C 5.6 09/16/2021  A/P: hopefully stable- update a1c today. Continue without meds for now    #hypertension S: medication: Metoprolol 25 mg extended release, benazepril 5 mg BP Readings from Last 3 Encounters:  10/08/23 128/84  10/06/23 (!) 140/78  09/29/23 (!) 150/72  A/P: stable- continue current medicines    Recommended follow up: No follow-ups on file. Future Appointments  Date Time Provider Department Center  11/02/2023  1:30 PM Napoleon Form, MD [facility]  02/28/2024  3:30 PM [facility] ANNUAL WELLNESS VISIT 1 [facility]    Lab/Order associations:   ICD-10-CM   1. Coronary artery  disease involving native coronary artery of native heart without angina pectoris  I25.10     2. Chronic systolic heart failure (HCC)  D66.44     3. Essential hypertension  I10 Comprehensive metabolic panel    CBC with Differential/Platelet    4. Hyperglycemia  R73.9 HgB A1c    5. Hyperlipidemia, unspecified hyperlipidemia type  E78.5 Comprehensive metabolic panel    CBC with Differential/Platelet    6. Screening for diabetes mellitus  Z13.1 HgB A1c      Meds ordered this encounter  Medications   traMADol (ULTRAM) 50 MG tablet    Sig: Take 1 tablet (50 mg total) by mouth every 6 (six) hours as needed for moderate pain (pain score 4-6) or severe pain (pain score 7-10).    Dispense:  20 tablet    Refill:  0    Return precautions advised.  Tana Conch, MD

## 2023-10-13 ENCOUNTER — Ambulatory Visit: Payer: Medicare PPO | Admitting: Sports Medicine

## 2023-10-13 VITALS — BP 132/78 | Ht 68.0 in | Wt 170.0 lb

## 2023-10-13 DIAGNOSIS — M5441 Lumbago with sciatica, right side: Secondary | ICD-10-CM

## 2023-10-13 DIAGNOSIS — G8929 Other chronic pain: Secondary | ICD-10-CM | POA: Diagnosis not present

## 2023-10-13 DIAGNOSIS — M5442 Lumbago with sciatica, left side: Secondary | ICD-10-CM | POA: Diagnosis not present

## 2023-10-13 DIAGNOSIS — M51362 Other intervertebral disc degeneration, lumbar region with discogenic back pain and lower extremity pain: Secondary | ICD-10-CM | POA: Diagnosis not present

## 2023-10-13 MED ORDER — METHYLPREDNISOLONE 4 MG PO TBPK: ORAL_TABLET | ORAL | 0 refills | Status: DC

## 2023-10-13 NOTE — Patient Instructions (Addendum)
Prednisone dos pak Tylenol [phone] mg 2-3 times a day for pain relief  Epidural bilat L5-S1 Can use tramadol as needed for breakthrough pain  Follow up 2 weeks after epidural

## 2023-10-13 NOTE — Progress Notes (Signed)
Aleen Sells [facility] [address] [place] [address] Phone: [phone]   Assessment and Plan:     1. Chronic bilateral low back pain with bilateral sciatica 2. Degeneration of intervertebral disc of lumbar region with discogenic back pain and lower extremity pain -Chronic with exacerbation, subsequent sports medicine visit - Patient's back pain has changed since previous office visit.  Patient was having back pain that significantly improved with epidural CSI to L3-L4, however since previous epidural on 09/29/2023, patient has had a new pain radiating in an L5-S1 pattern bilaterally down posterior hips, posterior legs, posterior calf.  CT from 02/04/2023 showed multilevel degenerative changes most severe at L3-L4, but also included degenerative changes at L4-L5 and L5-S1.  Recommend bilateral epidural CSI at L5-S1 based on HPI and physical exam - Patient is leaving town and will not be able to get epidural for at least 10 days.  In the meantime we will use prednisone Dosepak to decrease symptoms.  Prednisone Dosepak prescribed.  Discussed that this could elevate heart rate with past medical history of atrial fibrillation - I do not recommend NSAID use due to past medical history of atrial fibrillation on chronic anticoagulation with Xarelto.   Pertinent previous records reviewed include lumbar CT 02/04/2023, epidural CSI note 09/29/2023  Follow Up: 2 weeks after epidural CSI to review benefit   Subjective:   I, Jerene Canny, am serving as a Neurosurgeon for Doctor Richardean Sale   Chief Complaint: low back pain    HPI:    01/28/2023 Patient is a 77 year old male complaining of low back pain. Patient states chronic intermittent pain 2-3 months ago he started having new pain that started in his thigh up to his butt and down to his knees , pain when standing , bending over, pain when coughing and sneezing, pain is a pressure feeling , he was taking  indomethacin , has been taking tylenol does not seems to help , no numbness or tingling ,    05/12/2023 Patient states that he is not well, only had 2 weeks of relief from 1st epidural , 2nd epidural pain has come back and it was all movements   10/13/2023 Patient states that his pain has morphed into sciatica now an electrical shooting pain from is butt to to his legs.     Relevant Historical Information: On chronic anticoagulation with Xarelto, hypertension, paroxysmal atrial fibrillation, CAD, CHF  Additional pertinent review of systems negative.   Current Outpatient Medications:    benazepril (LOTENSIN) 5 MG tablet, Take 1 tablet (5 mg total) by mouth daily., Disp: 90 tablet, Rfl: 3   cyclobenzaprine (FLEXERIL) 5 MG tablet, Take 1 tablet (5 mg total) by mouth 3 (three) times daily as needed for muscle spasms., Disp: 30 tablet, Rfl: 2   gemfibrozil (LOPID) 600 MG tablet, TAKE 1 TABLET BY MOUTH TWICE DAILY BEFORE  A  MEAL, Disp: 180 tablet, Rfl: 3   icosapent Ethyl (VASCEPA) 1 g capsule, Take 1 g by mouth 2 (two) times daily., Disp: , Rfl:    indomethacin (INDOCIN) 25 MG capsule, TAKE 1 CAPSULE BY MOUTH TWICE DAILY WITH A MEAL AS NEEDED FOR PAIN. 3  DAYS  MAXIMUM, Disp: 30 capsule, Rfl: 0   methylPREDNISolone (MEDROL DOSEPAK) 4 MG TBPK tablet, Take 6 tablets on day 1.  Take 5 tablets on day 2.  Take 4 tablets on day 3.  Take 3 tablets on day 4.  Take 2 tablets on  day 5.  Take 1 tablet on day 6., Disp: 21 tablet, Rfl: 0   metoprolol succinate (TOPROL-XL) 25 MG 24 hr tablet, Take 1 tablet (25 mg total) by mouth daily., Disp: 90 tablet, Rfl: 3   metoprolol tartrate (LOPRESSOR) 25 MG tablet, Take 1 tablet (25 mg total) by mouth as needed. Take 1 tablet (25 mg total) as needed for heart rate greater than 120, Disp: 45 tablet, Rfl: 3   Multiple Vitamin (MULTIVITAMIN) tablet, Take 1 tablet by mouth daily., Disp: , Rfl:    omeprazole (PRILOSEC) 20 MG capsule, Take 1 capsule by mouth once daily,  Disp: 90 capsule, Rfl: 0   rosuvastatin (CRESTOR) 40 MG tablet, Take 1 tablet by mouth once daily, Disp: 90 tablet, Rfl: 0   tadalafil (CIALIS) 5 MG tablet, TAKE ONE TABLET BY MOUTH DAILY AS NEEDED FOR ERECTILE DYSFUNCTION, Disp: 90 tablet, Rfl: 3   tamsulosin (FLOMAX) 0.4 MG CAPS capsule, Take 1 capsule by mouth once daily, Disp: 90 capsule, Rfl: 0   traMADol (ULTRAM) 50 MG tablet, Take 1 tablet (50 mg total) by mouth every 6 (six) hours as needed for moderate pain (pain score 4-6) or severe pain (pain score 7-10)., Disp: 20 tablet, Rfl: 0   valACYclovir (VALTREX) 1000 MG tablet, Take 1 tablet (1,000 mg total) by mouth 3 (three) times daily. For 7 days for shingles., Disp: 21 tablet, Rfl: 0   XARELTO 20 MG TABS tablet, TAKE 1 TABLET BY MOUTH ONCE DAILY WITH SUPPER, Disp: 90 tablet, Rfl: 2   zolpidem (AMBIEN) 5 MG tablet, Take 1 tablet (5 mg total) by mouth at bedtime., Disp: 90 tablet, Rfl: 1   Objective:     Vitals:   10/13/23 1354  BP: 132/78  Weight: 170 lb (77.1 kg)  Height: 5\' 8"  (1.727 m)      Body mass index is 25.85 kg/m.    Physical Exam:    Gen: Appears well, nad, nontoxic and pleasant Psych: Alert and oriented, appropriate mood and affect Neuro: sensation intact, strength is 5/5 in upper and lower extremities, muscle tone wnl Skin: no susupicious lesions or rashes  Back - Normal skin, Spine with normal alignment and no deformity.    tenderness to lumbar vertebral process palpation.   Bilateral lumbar paraspinous muscles are   tender and without spasm  TTP gluteal musculature Straight leg raise positive bilaterally     Electronically signed by:  Aleen Sells [facility] 2:32 PM 10/13/23

## 2023-10-18 ENCOUNTER — Telehealth: Payer: Self-pay

## 2023-10-18 NOTE — Telephone Encounter (Signed)
Pharmacy please advise on holding Xarelto prior to lumbar ESI scheduled for TBD. Thank you.

## 2023-10-18 NOTE — Telephone Encounter (Signed)
No interference, should not need Pacemaker device clearance unless would be using cautery.  Typically don't need device clearance for this procedure.

## 2023-10-18 NOTE — Telephone Encounter (Signed)
Good Morning Dr. Ladona Ridgel  We have received a surgical clearance request for [name] for lumbar ESI. They were seen recently in clinic on 10/06/23. Can you please comment on surgical clearance. Please forward you guidance and recommendations to P [facility] PREOP   Thanks,

## 2023-10-18 NOTE — Telephone Encounter (Signed)
Patient with diagnosis of Afib on Xarelto for anticoagulation.    Procedure: LUMBAR EPI  Date of procedure: TBD   CHA2DS2-VASc Score = 7   This indicates a 11.2% annual risk of stroke. The patient's score is based upon: CHF History: 1 HTN History: 1 Diabetes History: 0 Stroke History: 2 Vascular Disease History: 1 Age Score: 2 Gender Score: 0     CrCl 52 mL/min Platelet count 317 K (10/08/2023)  Previously held for 3 days for Epidural without bridge (02/05/2023) Per office protocol, patient can hold Xarelto for 3 days prior to procedure.     **This guidance is not considered finalized until pre-operative APP has relayed final recommendations.**

## 2023-10-18 NOTE — Telephone Encounter (Signed)
...     Pre-operative Risk Assessment    Patient Name: [name]  DOB: [birth year] MRN: [id number]      Request for Surgical Clearance    Procedure:   LUMBAR EPI  Date of Surgery:  Clearance TBD                                 Surgeon:  Loreli Slot Surgeon's Group or Practice Name:  [facility] Phone number:  [phone] Fax number:  [phone]   Type of Clearance Requested:   - Medical  - Pharmacy:  Hold Rivaroxaban (Xarelto)     Type of Anesthesia:  Not Indicated   Additional requests/questions:   LAST O/V 10/06/23  Signed, Renee Ramus   10/18/2023, 1:06 PM

## 2023-10-21 NOTE — Telephone Encounter (Signed)
   Patient Name: [name]  DOB: 16-Sep-1946 MRN: [id number]  Primary Cardiologist: [name], MD Procedure: Lumbar EPI with [facility] imaging   Chart reviewed as part of pre-operative protocol coverage. Given past medical history and time since last visit, based on ACC/AHA guidelines, [name] is at acceptable risk for the planned procedure without further cardiovascular testing. Per Dr. Ladona Ridgel, " he may proceed with the procedure, he swims vigorously.  He can proceed."  Per office protocol and pharmD, patient can hold Xarelto for 3 days prior to procedure.  Please resume when safe to do so from bleeding standpoint.  I will route this recommendation to the requesting party via Epic fax function and remove from pre-op pool.  Please call with questions.  Rip Harbour, NP 10/21/2023, 11:07 AM

## 2023-10-22 ENCOUNTER — Other Ambulatory Visit: Payer: Self-pay | Admitting: Sports Medicine

## 2023-10-22 ENCOUNTER — Encounter: Payer: Self-pay | Admitting: Sports Medicine

## 2023-10-22 MED ORDER — GABAPENTIN 100 MG PO CAPS: 100.0000 mg | ORAL_CAPSULE | Freq: Two times a day (BID) | ORAL | 0 refills | Status: DC

## 2023-10-26 ENCOUNTER — Other Ambulatory Visit: Payer: Self-pay

## 2023-10-26 ENCOUNTER — Encounter: Payer: Self-pay | Admitting: Family Medicine

## 2023-10-26 MED ORDER — INDOMETHACIN 25 MG PO CAPS: 25.0000 mg | ORAL_CAPSULE | Freq: Two times a day (BID) | ORAL | 3 refills | Status: AC

## 2023-10-31 ENCOUNTER — Encounter: Payer: Self-pay | Admitting: Sports Medicine

## 2023-11-01 DIAGNOSIS — R002 Palpitations: Secondary | ICD-10-CM | POA: Diagnosis not present

## 2023-11-01 DIAGNOSIS — I350 Nonrheumatic aortic (valve) stenosis: Secondary | ICD-10-CM | POA: Diagnosis not present

## 2023-11-02 ENCOUNTER — Encounter: Payer: Medicare PPO | Admitting: Gastroenterology

## 2023-11-02 ENCOUNTER — Other Ambulatory Visit: Payer: Self-pay | Admitting: Sports Medicine

## 2023-11-02 DIAGNOSIS — G8929 Other chronic pain: Secondary | ICD-10-CM

## 2023-11-02 DIAGNOSIS — M51362 Other intervertebral disc degeneration, lumbar region with discogenic back pain and lower extremity pain: Secondary | ICD-10-CM

## 2023-11-02 DIAGNOSIS — M47816 Spondylosis without myelopathy or radiculopathy, lumbar region: Secondary | ICD-10-CM

## 2023-11-02 MED ORDER — GABAPENTIN 100 MG PO CAPS: 200.0000 mg | ORAL_CAPSULE | Freq: Two times a day (BID) | ORAL | 0 refills | Status: DC

## 2023-11-02 MED ORDER — CYCLOBENZAPRINE HCL 5 MG PO TABS: 5.0000 mg | ORAL_TABLET | Freq: Three times a day (TID) | ORAL | 0 refills | Status: DC | PRN

## 2023-11-02 NOTE — Discharge Instructions (Signed)
 Post Procedure Spinal Discharge Instruction Sheet  You may resume a regular diet and any medications that you routinely take (including pain medications) unless otherwise noted by MD.  No driving day of procedure.  Light activity throughout the rest of the day.  Do not do any strenuous work, exercise, bending or lifting.  The day following the procedure, you can resume normal physical activity but you should refrain from exercising or physical therapy for at least three days thereafter.  You may apply ice to the injection site, 20 minutes on, 20 minutes off, as needed. Do not apply ice directly to skin.    Common Side Effects:  Headaches- take your usual medications as directed by your physician.  Increase your fluid intake.  Caffeinated beverages may be helpful.  Lie flat in bed until your headache resolves.  Restlessness or inability to sleep- you may have trouble sleeping for the next few days.  Ask your referring physician if you need any medication for sleep.  Facial flushing or redness- should subside within a few days.  Increased pain- a temporary increase in pain a day or two following your procedure is not unusual.  Take your pain medication as prescribed by your referring physician.  Leg cramps  Please contact our office at [phone] for the following symptoms: Fever greater than 100 degrees. Headaches unresolved with medication after 2-3 days. Increased swelling, pain, or redness at injection site.   Thank you for visiting [facility] today.   YOU MAY RESUME YOUR XARELTO IN 48 HOURS.

## 2023-11-02 NOTE — Progress Notes (Signed)
Patient has elected to proceed with epidural steroid injection.  We allow for refills of Flexeril and Neurontin to be used as needed for pain relief and neurologic symptoms.  Refills placed today.

## 2023-11-03 ENCOUNTER — Ambulatory Visit
Admission: RE | Admit: 2023-11-03 | Discharge: 2023-11-03 | Disposition: A | Discharge status: Home / Self Care | Payer: Medicare PPO | Source: Ambulatory Visit | Attending: Sports Medicine | Admitting: Sports Medicine

## 2023-11-03 ENCOUNTER — Encounter: Payer: Self-pay | Admitting: Sports Medicine

## 2023-11-03 DIAGNOSIS — M48061 Spinal stenosis, lumbar region without neurogenic claudication: Secondary | ICD-10-CM | POA: Diagnosis not present

## 2023-11-03 DIAGNOSIS — G8929 Other chronic pain: Secondary | ICD-10-CM

## 2023-11-03 DIAGNOSIS — M4727 Other spondylosis with radiculopathy, lumbosacral region: Secondary | ICD-10-CM | POA: Diagnosis not present

## 2023-11-03 DIAGNOSIS — M51362 Other intervertebral disc degeneration, lumbar region with discogenic back pain and lower extremity pain: Secondary | ICD-10-CM

## 2023-11-03 MED ORDER — METHYLPREDNISOLONE ACETATE 40 MG/ML INJ SUSP (RADIOLOG
80.0000 mg | Freq: Once | INTRAMUSCULAR | Status: AC
  Administered 2023-11-03: 80 mg via EPIDURAL

## 2023-11-03 MED ORDER — IOPAMIDOL (ISOVUE-M 200) INJECTION 41%
1.0000 mL | Freq: Once | INTRAMUSCULAR | Status: AC
  Administered 2023-11-03: 1 mL via EPIDURAL

## 2023-12-06 NOTE — Progress Notes (Signed)
 [name] [name] [address] [place] [address] Phone: [phone]   Assessment and Plan:     1. Chronic bilateral low back pain with bilateral sciatica (Primary) 2. Degeneration of intervertebral disc of lumbar region with discogenic back pain and lower extremity pain 3. Lumbar facet arthropathy -Chronic with exacerbation, subsequent sports medicine visit - Continued low back pain with bilateral radicular symptoms.  Patient's CT lumbar spine from 02/04/2023 showed multilevel degenerative changes most severe at L3-L4, but included degenerative changes at L4-L5 and L5-S1.  Patient has had 5 epidural procedures within the past 1 year with most recent on 11/03/23 that did not provide any relief - Based on patient's failure to improve with conservative therapy including medical management and injections, I recommend establishing care with neurosurgery.  Patient will be out of state until March 2025.  We will place referral today and patient can establish care when he is able - Continue Flexeril  5 to 10 mg 3 times daily as needed for muscle spasms.  Refill provided - Increase to gabapentin  400 mg twice daily.  Refill provided - Continue HEP for low back   Pertinent previous records reviewed include epidural procedure note 11/03/23  Follow Up: As needed   Subjective:   I, [name], am serving as a neurosurgeon for [name]   Chief Complaint: low back pain    HPI:    01/28/2023 Patient is a 78 year old male complaining of low back pain. Patient states chronic intermittent pain 2-3 months ago he started having new pain that started in his thigh up to his butt and down to his knees , pain when standing , bending over, pain when coughing and sneezing, pain is a pressure feeling , he was taking indomethacin  , has been taking tylenol  does not seems to help , no numbness or tingling ,    05/12/2023 Patient states that he is not  well, only had 2 weeks of relief from 1st epidural , 2nd epidural pain has come back and it was all movements    10/13/2023 Patient states that his pain has morphed into sciatica now an electrical shooting pain from is butt to to his legs.    12/07/2023 Patient states he was doing poorly up until Sunday he felt better. This morning he woke up and is in a lot of pain from his waist to his buttock   Relevant Historical Information: On chronic anticoagulation with Xarelto , hypertension, paroxysmal atrial fibrillation, CAD, CHF  Additional pertinent review of systems negative.   Current Outpatient Medications:    benazepril  (LOTENSIN ) 5 MG tablet, Take 1 tablet (5 mg total) by mouth daily., Disp: 90 tablet, Rfl: 3   gemfibrozil  (LOPID ) 600 MG tablet, TAKE 1 TABLET BY MOUTH TWICE DAILY BEFORE  A  MEAL, Disp: 180 tablet, Rfl: 3   icosapent Ethyl (VASCEPA) 1 g capsule, Take 1 g by mouth 2 (two) times daily., Disp: , Rfl:    indomethacin  (INDOCIN ) 25 MG capsule, Take 1 capsule (25 mg total) by mouth 2 (two) times daily with a meal., Disp: 60 capsule, Rfl: 3   methylPREDNISolone  (MEDROL  DOSEPAK) 4 MG TBPK tablet, Take 6 tablets on day 1.  Take 5 tablets on day 2.  Take 4 tablets on day 3.  Take 3 tablets on day 4.  Take 2 tablets on day 5.  Take 1 tablet on day 6., Disp: 21 tablet, Rfl: 0   metoprolol  succinate (TOPROL -XL) 25  MG 24 hr tablet, Take 1 tablet (25 mg total) by mouth daily., Disp: 90 tablet, Rfl: 3   metoprolol  tartrate (LOPRESSOR ) 25 MG tablet, Take 1 tablet (25 mg total) by mouth as needed. Take 1 tablet (25 mg total) as needed for heart rate greater than 120, Disp: 45 tablet, Rfl: 3   Multiple Vitamin (MULTIVITAMIN) tablet, Take 1 tablet by mouth daily., Disp: , Rfl:    omeprazole  (PRILOSEC) 20 MG capsule, Take 1 capsule by mouth once daily, Disp: 90 capsule, Rfl: 0   rosuvastatin  (CRESTOR ) 40 MG tablet, Take 1 tablet by mouth once daily, Disp: 90 tablet, Rfl: 0   tadalafil  (CIALIS ) 5 MG  tablet, TAKE ONE TABLET BY MOUTH DAILY AS NEEDED FOR ERECTILE DYSFUNCTION, Disp: 90 tablet, Rfl: 3   tamsulosin  (FLOMAX ) 0.4 MG CAPS capsule, Take 1 capsule by mouth once daily, Disp: 90 capsule, Rfl: 0   traMADol  (ULTRAM ) 50 MG tablet, Take 1 tablet (50 mg total) by mouth every 6 (six) hours as needed for moderate pain (pain score 4-6) or severe pain (pain score 7-10)., Disp: 20 tablet, Rfl: 0   valACYclovir  (VALTREX ) 1000 MG tablet, Take 1 tablet (1,000 mg total) by mouth 3 (three) times daily. For 7 days for shingles., Disp: 21 tablet, Rfl: 0   XARELTO  20 MG TABS tablet, TAKE 1 TABLET BY MOUTH ONCE DAILY WITH SUPPER, Disp: 90 tablet, Rfl: 2   zolpidem  (AMBIEN ) 5 MG tablet, Take 1 tablet (5 mg total) by mouth at bedtime., Disp: 90 tablet, Rfl: 1   cyclobenzaprine  (FLEXERIL ) 5 MG tablet, Take 1 tablet (5 mg total) by mouth 3 (three) times daily as needed for muscle spasms., Disp: 90 tablet, Rfl: 0   gabapentin  (NEURONTIN ) 400 MG capsule, Take 1 capsule (400 mg total) by mouth 2 (two) times daily., Disp: 180 capsule, Rfl: 0   Objective:     Vitals:   12/07/23 1349  BP: 136/84  Pulse: 67  SpO2: 98%  Weight: 171 lb (77.6 kg)  Height: 5' 8 (1.727 m)      Body mass index is 26 kg/m.    Physical Exam:    Gen: Appears well, nad, nontoxic and pleasant Psych: Alert and oriented, appropriate mood and affect Neuro: Numbness and tingling radiating from low back to bilateral lateral hip.  Otherwise sensation intact, strength is 5/5 in upper and lower extremities, muscle tone wnl Skin: no susupicious lesions or rashes   Back - Normal skin, Spine with normal alignment and no deformity.    tenderness to lumbar vertebral process palpation.   Bilateral lumbar paraspinous muscles are   tender and without spasm  TTP gluteal musculature Straight leg raise positive bilaterally    Electronically signed by:  [name] [name] 2:48 PM 12/07/23

## 2023-12-07 ENCOUNTER — Ambulatory Visit: Payer: Medicare PPO | Admitting: Sports Medicine

## 2023-12-07 VITALS — BP 136/84 | HR 67 | Ht 68.0 in | Wt 171.0 lb

## 2023-12-07 DIAGNOSIS — M5442 Lumbago with sciatica, left side: Secondary | ICD-10-CM

## 2023-12-07 DIAGNOSIS — M5441 Lumbago with sciatica, right side: Secondary | ICD-10-CM

## 2023-12-07 DIAGNOSIS — G8929 Other chronic pain: Secondary | ICD-10-CM

## 2023-12-07 DIAGNOSIS — M47816 Spondylosis without myelopathy or radiculopathy, lumbar region: Secondary | ICD-10-CM | POA: Diagnosis not present

## 2023-12-07 DIAGNOSIS — M51362 Other intervertebral disc degeneration, lumbar region with discogenic back pain and lower extremity pain: Secondary | ICD-10-CM | POA: Diagnosis not present

## 2023-12-07 MED ORDER — CYCLOBENZAPRINE HCL 5 MG PO TABS: 5.0000 mg | ORAL_TABLET | Freq: Three times a day (TID) | ORAL | 0 refills | Status: DC | PRN

## 2023-12-07 MED ORDER — GABAPENTIN 400 MG PO CAPS: 400.0000 mg | ORAL_CAPSULE | Freq: Two times a day (BID) | ORAL | 0 refills | Status: DC

## 2023-12-07 NOTE — Patient Instructions (Addendum)
 Flexeril 5-10 mg 3x a day as needed   gabapentin 400 mg 2x a day  Neurosurgery referral  As needed follow up

## 2024-01-06 ENCOUNTER — Encounter: Payer: Self-pay | Admitting: Gastroenterology

## 2024-01-24 ENCOUNTER — Telehealth: Payer: Self-pay | Admitting: Sports Medicine

## 2024-01-24 ENCOUNTER — Other Ambulatory Visit: Payer: Self-pay | Admitting: Sports Medicine

## 2024-01-24 DIAGNOSIS — M47816 Spondylosis without myelopathy or radiculopathy, lumbar region: Secondary | ICD-10-CM

## 2024-01-24 DIAGNOSIS — G8929 Other chronic pain: Secondary | ICD-10-CM

## 2024-01-24 DIAGNOSIS — M51362 Other intervertebral disc degeneration, lumbar region with discogenic back pain and lower extremity pain: Secondary | ICD-10-CM

## 2024-01-24 MED ORDER — CYCLOBENZAPRINE HCL 5 MG PO TABS: 5.0000 mg | ORAL_TABLET | Freq: Three times a day (TID) | ORAL | 0 refills | Status: DC | PRN

## 2024-01-24 NOTE — Telephone Encounter (Addendum)
 Patient called about Cyclobenzaprine 5mg  medication- Stated he is on vacation and has one day left of medication so he needs a refill. The Pharmacy he is near is phone number [phone]) [facility] [address] [place] [address].   [phone] is the patients cell number provided over the phone to call while in [place] and that he called from today.

## 2024-01-31 ENCOUNTER — Other Ambulatory Visit: Payer: Self-pay | Admitting: Family Medicine

## 2024-02-01 DIAGNOSIS — L578 Other skin changes due to chronic exposure to nonionizing radiation: Secondary | ICD-10-CM | POA: Diagnosis not present

## 2024-02-01 DIAGNOSIS — Z5181 Encounter for therapeutic drug level monitoring: Secondary | ICD-10-CM | POA: Diagnosis not present

## 2024-02-01 DIAGNOSIS — B354 Tinea corporis: Secondary | ICD-10-CM | POA: Diagnosis not present

## 2024-02-01 DIAGNOSIS — D225 Melanocytic nevi of trunk: Secondary | ICD-10-CM | POA: Diagnosis not present

## 2024-02-01 DIAGNOSIS — Z85828 Personal history of other malignant neoplasm of skin: Secondary | ICD-10-CM | POA: Diagnosis not present

## 2024-02-01 DIAGNOSIS — L570 Actinic keratosis: Secondary | ICD-10-CM | POA: Diagnosis not present

## 2024-02-01 DIAGNOSIS — L821 Other seborrheic keratosis: Secondary | ICD-10-CM | POA: Diagnosis not present

## 2024-02-01 DIAGNOSIS — L111 Transient acantholytic dermatosis [Grover]: Secondary | ICD-10-CM | POA: Diagnosis not present

## 2024-02-02 ENCOUNTER — Ambulatory Visit: Payer: Medicare PPO | Admitting: Physician Assistant

## 2024-02-04 ENCOUNTER — Ambulatory Visit: Payer: Medicare PPO

## 2024-02-04 VITALS — Ht 68.0 in | Wt 175.0 lb

## 2024-02-04 DIAGNOSIS — Z860101 Personal history of adenomatous and serrated colon polyps: Secondary | ICD-10-CM

## 2024-02-04 NOTE — Progress Notes (Signed)
 No egg or soy allergy known to patient  No issues known to pt with past sedation with any surgeries or procedures Patient denies ever being told they had issues or difficulty with intubation  No FH of Malignant Hyperthermia Pt is not on diet pills Pt is not on  home 02  Pt is on  blood thinners - Xarelto Pt denies issues with constipation  Pt has hx A fib or A flutter Have any cardiac testing pending--no  LOA: independent  Prep: sutab  Patient's chart reviewed by Cathlyn Parsons CNRA prior to previsit and patient appropriate for the [facility].  Previsit completed and red dot placed by patient's name on their procedure day (on provider's schedule).     PV completed with patient. Prep instructions sent via mychart and home address.

## 2024-02-07 ENCOUNTER — Other Ambulatory Visit: Payer: Self-pay | Admitting: Family Medicine

## 2024-02-11 ENCOUNTER — Encounter: Payer: Self-pay | Admitting: Family Medicine

## 2024-02-11 ENCOUNTER — Encounter: Payer: Self-pay | Admitting: Sports Medicine

## 2024-02-14 ENCOUNTER — Encounter: Payer: Self-pay | Admitting: Gastroenterology

## 2024-02-15 ENCOUNTER — Encounter: Payer: Self-pay | Admitting: Family Medicine

## 2024-02-15 ENCOUNTER — Ambulatory Visit: Admitting: Family Medicine

## 2024-02-15 VITALS — BP 108/74 | HR 70 | Temp 97.2°F | Ht 68.0 in | Wt 175.8 lb

## 2024-02-15 DIAGNOSIS — I4891 Unspecified atrial fibrillation: Secondary | ICD-10-CM | POA: Diagnosis not present

## 2024-02-15 DIAGNOSIS — R197 Diarrhea, unspecified: Secondary | ICD-10-CM | POA: Diagnosis not present

## 2024-02-15 DIAGNOSIS — I4892 Unspecified atrial flutter: Secondary | ICD-10-CM | POA: Diagnosis not present

## 2024-02-15 DIAGNOSIS — I10 Essential (primary) hypertension: Secondary | ICD-10-CM

## 2024-02-15 LAB — CBC WITH DIFFERENTIAL/PLATELET
Basophils Absolute: 0.1 10*3/uL (ref 0.0–0.1)
Basophils Relative: 0.8 % (ref 0.0–3.0)
Eosinophils Absolute: 0.6 10*3/uL (ref 0.0–0.7)
Eosinophils Relative: 7.9 % — ABNORMAL HIGH (ref 0.0–5.0)
HCT: 42.7 % (ref 39.0–52.0)
Hemoglobin: 14.1 g/dL (ref 13.0–17.0)
Lymphocytes Relative: 28.9 % (ref 12.0–46.0)
Lymphs Abs: 2.2 10*3/uL (ref 0.7–4.0)
MCHC: 33.1 g/dL (ref 30.0–36.0)
MCV: 90.3 fl (ref 78.0–100.0)
Monocytes Absolute: 0.8 10*3/uL (ref 0.1–1.0)
Monocytes Relative: 10.4 % (ref 3.0–12.0)
Neutro Abs: 3.9 10*3/uL (ref 1.4–7.7)
Neutrophils Relative %: 52 % (ref 43.0–77.0)
Platelets: 255 10*3/uL (ref 150.0–400.0)
RBC: 4.73 Mil/uL (ref 4.22–5.81)
RDW: 13.6 % (ref 11.5–15.5)
WBC: 7.5 10*3/uL (ref 4.0–10.5)

## 2024-02-15 LAB — COMPREHENSIVE METABOLIC PANEL
ALT: 14 U/L (ref 0–53)
AST: 19 U/L (ref 0–37)
Albumin: 4.5 g/dL (ref 3.5–5.2)
Alkaline Phosphatase: 76 U/L (ref 39–117)
BUN: 15 mg/dL (ref 6–23)
CO2: 24 meq/L (ref 19–32)
Calcium: 9.3 mg/dL (ref 8.4–10.5)
Chloride: 107 meq/L (ref 96–112)
Creatinine, Ser: 1.26 mg/dL (ref 0.40–1.50)
GFR: 55.08 mL/min — ABNORMAL LOW (ref >60.00)
Glucose, Bld: 132 mg/dL — ABNORMAL HIGH (ref 70–99)
Potassium: 4.2 meq/L (ref 3.5–5.1)
Sodium: 140 meq/L (ref 135–145)
Total Bilirubin: 0.6 mg/dL (ref 0.2–1.2)
Total Protein: 7.3 g/dL (ref 6.0–8.3)

## 2024-02-15 NOTE — Patient Instructions (Addendum)
 Stool collection kit- pick up from lab- only submit diarrhea  Hold fluconazole for now  Please stop by lab before you go If you have mychart- we will send your results within 3 business days of [place] receiving them.  If you do not have mychart- we will call you about results within 5 business days of [place] receiving them.  *please also note that you will see labs on mychart as soon as they post. I will later go in and write notes on them- will say "notes from Dr. Durene Cal"   Recommended follow up: Return in about 4 months (around 06/16/2024) for physical or sooner if needed.Schedule b4 you leave.

## 2024-02-15 NOTE — Progress Notes (Signed)
 Phone [phone] In person visit   Subjective:   [name] is a 78 y.o. year old very pleasant male patient who presents for/with See problem oriented charting Chief Complaint  Patient presents with   Gas    Pt c/o gas and diarrhea x7-10 days, denies n/v. Colonoscopy scheduled for 03/24.    Past Medical History-  Patient Active Problem List   Diagnosis Date Noted   Paroxysmal atrial fibrillation (HCC) 07/27/2019    Priority: High   S/P cardiac cath 06/07/2019    Priority: High   CAD (coronary artery disease) 05/26/2016    Priority: High   Nonischemic cardiomyopathy (HCC) 04/23/2016    Priority: High   Chronic systolic heart failure (HCC) 12/01/2015    Priority: High   Cerebral infarction (HCC) 01/30/2015    Priority: High   Atrial fibrillation and flutter (HCC) 01/01/2015    Priority: High   Ventricular tachycardia, non-sustained (HCC) 01/25/2013    Priority: High   Cardiac pacemaker in situ 12/28/2009    Priority: High   Aortic atherosclerosis (HCC) 08/01/2020    Priority: Medium    Fatty liver 08/01/2020    Priority: Medium    Double vision 07/04/2018    Priority: Medium    Hyperglycemia 06/10/2017    Priority: Medium    Insomnia 01/30/2015    Priority: Medium    BPH associated with nocturia 08/14/2014    Priority: Medium    Gout 11/11/2009    Priority: Medium    Hyperlipidemia 07/12/2007    Priority: Medium    NEUROPATHY, ISCHEMIC OPTIC 06/02/2007    Priority: Medium    Essential hypertension 06/02/2007    Priority: Medium    Anticoagulation adequate 06/07/2019    Priority: Low   Convergence insufficiency 06/07/2019    Priority: Low   Fitting or adjustment of cardiac pacemaker 06/07/2019    Priority: Low   Pacemaker-dependent due to native cardiac rhythm insufficient to support life 06/07/2019    Priority: Low   Palpitations 06/07/2019    Priority: Low   History of nuclear stress test 06/07/2019    Priority: Low   Non-occlusive coronary  artery disease 06/07/2019    Priority: Low   Trigger finger, right middle finger 03/01/2018    Priority: Low   Solitary pulmonary nodule 05/26/2016    Priority: Low   Chest pressure 12/01/2015    Priority: Low   Left chest pressure 03/19/2015    Priority: Low   Erectile dysfunction 01/30/2015    Priority: Low   Former smoker 01/30/2015    Priority: Low   Allergic rhinitis 10/11/2014    Priority: Low   Sinoatrial node dysfunction (HCC)     Priority: Low   SPINAL STENOSIS, LUMBAR 08/22/2010    Priority: Low   GERD 03/17/2010    Priority: Low    Medications- reviewed and updated Current Outpatient Medications  Medication Sig Dispense Refill   benazepril (LOTENSIN) 5 MG tablet Take 1 tablet (5 mg total) by mouth daily. 90 tablet 3   fluconazole (DIFLUCAN) 150 MG tablet Take 150 mg by mouth once a week.     gabapentin (NEURONTIN) 400 MG capsule Take 1 capsule (400 mg total) by mouth 2 (two) times daily. 180 capsule 0   gemfibrozil (LOPID) 600 MG tablet TAKE 1 TABLET BY MOUTH TWICE DAILY BEFORE  A  MEAL 180 tablet 3   icosapent Ethyl (VASCEPA) 1 g capsule Take 1 g by mouth 2 (two) times daily.     indomethacin (INDOCIN)  25 MG capsule Take 1 capsule (25 mg total) by mouth 2 (two) times daily with a meal. 60 capsule 3   metoprolol succinate (TOPROL-XL) 25 MG 24 hr tablet Take 1 tablet (25 mg total) by mouth daily. 90 tablet 3   metoprolol tartrate (LOPRESSOR) 25 MG tablet Take 1 tablet (25 mg total) by mouth as needed. Take 1 tablet (25 mg total) as needed for heart rate greater than 120 45 tablet 3   Multiple Vitamin (MULTIVITAMIN) tablet Take 1 tablet by mouth daily.     omeprazole (PRILOSEC) 20 MG capsule Take 1 capsule by mouth once daily 90 capsule 0   rosuvastatin (CRESTOR) 20 MG tablet Take 20 mg by mouth daily.     tadalafil (CIALIS) 5 MG tablet TAKE 1 TABLET BY MOUTH DAILY AS NEEDED FOR ERECTILE DYSFUNCTION 90 tablet 3   tamsulosin (FLOMAX) 0.4 MG CAPS capsule Take 1 capsule  by mouth once daily 90 capsule 0   triamcinolone cream (KENALOG) 0.1 % Apply 1 Application topically 2 (two) times daily.     valACYclovir (VALTREX) 1000 MG tablet Take 1 tablet (1,000 mg total) by mouth 3 (three) times daily. For 7 days for shingles. 21 tablet 0   XARELTO 20 MG TABS tablet TAKE 1 TABLET BY MOUTH ONCE DAILY WITH SUPPER 90 tablet 2   zolpidem (AMBIEN) 5 MG tablet Take 1 tablet (5 mg total) by mouth at bedtime. 90 tablet 1   cyclobenzaprine (FLEXERIL) 5 MG tablet Take 1 tablet (5 mg total) by mouth 3 (three) times daily as needed for muscle spasms. (Patient not taking: Reported on 02/15/2024) 90 tablet 0   traMADol (ULTRAM) 50 MG tablet Take 1 tablet (50 mg total) by mouth every 6 (six) hours as needed for moderate pain (pain score 4-6) or severe pain (pain score 7-10). (Patient not taking: Reported on 02/15/2024) 20 tablet 0   No current facility-administered medications for this visit.     Objective:  BP 108/74   Temp (!) 97.2 F (36.2 C)   Ht 5\' 8"  (1.727 m)   Wt 175 lb 12.8 oz (79.7 kg)   BMI 26.73 kg/m  Gen: NAD, resting comfortably CV: Regular rate on exam Lungs: CTAB no crackles, wheeze, rhonchi Abdomen: soft/nontender/nondistended/normal bowel sounds. No rebound or guarding.  Ext: no edema Skin: warm, dry    Assessment and Plan    # diarrhea flatulence and belching S: Patient reports 7 to 10 days of symptoms.  No nausea or vomiting.  He has a colonoscopy scheduled on February 21, 2024. Prolonged flatulence- had incontinence several times with that. Had been in [place] prior totthat but had bene over a month since being there.  -even yesterday still had 3 episodes of diarrhea- imodium helped.  - intermittently having solid stools after using imodium.  - no dark stool and no blood in the stool- he has checked-no fever or chills reported  - started after getting back from [place] for month of february -Denies recent new medications or food trials - other than  fluconazole through dermatology- but was already having loose stools -Gemfibrozil on for years  A/P: Patient with 7 to 10 days of diarrhea/flatulence/belching.  I am concerned for gastroenteritis as initial trigger when I wonder if it is being persistent because he has started fluconazole on a daily basis and reports he thinks he is only supposed to be on it once a week.  I asked him to stop this immediately - With persistence of symptoms we will  also check CBC and CMP to evaluate for electrolyte abnormalities or anemia (particular with NSAID use despite Xarelto-we had an extensive discussion about potential risk first benefit ratio-on the benefits side prior to using indomethacin his pain was so bad he had to walk with a cane and is able to walk without a cane at this point) - Also check stool studies including GI pathogen panel and C. difficile though no recent antibiotic use -She is scheduled for colonoscopy March 24 and this can be investigated more thoroughly at that time if needed-she will certainly need to let them know if persistent   #hypertension S: medication: Metoprolol 25 mg extended release, benazepril 5 mg  BP Readings from Last 3 Encounters:  02/15/24 108/74  12/07/23 136/84  11/03/23 (!) 183/68  A/P: Blood pressure well-controlled-could be running lower with the diarrhea-for now continue current medications-no lightheadedness reported    # Atrial fibrillation S: Rate controlled with metoprolol 25 mg extended release Anticoagulated with Xarelto 20 mg A/P: appropriately anticoagulated and rate controlled- continue current medicine -Once again discussed benefit versus risk relationship with NSAIDs and Xarelto 20 mg-in particular we discussed the GI bleeding risk and what to look out for for this and the fact that if this occurs it would require [place] to stop the indomethacin most certainly - He wants to hold off on neurosurgical consult for now but had been mentioned by Dr. Jean Rosenthal of  sports medicine   Recommended follow up: Return in about 4 months (around 06/16/2024) for physical or sooner if needed.Schedule b4 you leave. Future Appointments  Date Time Provider Department Center  02/21/2024 10:00 AM Napoleon Form, MD [facility]  02/28/2024  3:40 PM [facility] ANNUAL WELLNESS VISIT 1 [facility]    Lab/Order associations:   ICD-10-CM   1. Diarrhea of presumed infectious origin  R19.7 C. difficile GDH and Toxin A/B    Gastrointestinal Pathogen Pnl RT, PCR    Comprehensive metabolic panel    CBC with Differential/Platelet    2. Essential hypertension  I10     3. Atrial fibrillation and flutter (HCC)  I48.91    I48.92       No orders of the defined types were placed in this encounter.   Return precautions advised.  Tana Conch, MD

## 2024-02-21 ENCOUNTER — Ambulatory Visit (AMBULATORY_SURGERY_CENTER): Payer: Medicare PPO | Admitting: Gastroenterology

## 2024-02-21 ENCOUNTER — Encounter: Payer: Self-pay | Admitting: Gastroenterology

## 2024-02-21 VITALS — BP 124/56 | HR 60 | Temp 97.5°F | Resp 12 | Ht 68.0 in | Wt 175.0 lb

## 2024-02-21 DIAGNOSIS — I10 Essential (primary) hypertension: Secondary | ICD-10-CM | POA: Diagnosis not present

## 2024-02-21 DIAGNOSIS — D125 Benign neoplasm of sigmoid colon: Secondary | ICD-10-CM | POA: Diagnosis not present

## 2024-02-21 DIAGNOSIS — Z860101 Personal history of adenomatous and serrated colon polyps: Secondary | ICD-10-CM

## 2024-02-21 DIAGNOSIS — K635 Polyp of colon: Secondary | ICD-10-CM

## 2024-02-21 DIAGNOSIS — I2510 Atherosclerotic heart disease of native coronary artery without angina pectoris: Secondary | ICD-10-CM | POA: Diagnosis not present

## 2024-02-21 DIAGNOSIS — I5022 Chronic systolic (congestive) heart failure: Secondary | ICD-10-CM | POA: Diagnosis not present

## 2024-02-21 DIAGNOSIS — K5730 Diverticulosis of large intestine without perforation or abscess without bleeding: Secondary | ICD-10-CM

## 2024-02-21 DIAGNOSIS — K648 Other hemorrhoids: Secondary | ICD-10-CM | POA: Diagnosis not present

## 2024-02-21 DIAGNOSIS — Z1211 Encounter for screening for malignant neoplasm of colon: Secondary | ICD-10-CM

## 2024-02-21 DIAGNOSIS — K644 Residual hemorrhoidal skin tags: Secondary | ICD-10-CM

## 2024-02-21 MED ORDER — SODIUM CHLORIDE 0.9 % IV SOLN: 500.0000 mL | Freq: Once | INTRAVENOUS | Status: DC

## 2024-02-21 NOTE — Patient Instructions (Addendum)
 Resume previous diet.                           - Continue present medications.                           - Await pathology results.                           - No repeat colonoscopy due to age.                           - Resume Xarelto (rivaroxaban) at prior dose today.                            Refer to managing physician for further adjustment                            of therapy.  Handout on polyps and diverticulosis given.     YOU HAD AN ENDOSCOPIC PROCEDURE TODAY AT THE  ENDOSCOPY CENTER:   Refer to the procedure report that was given to you for any specific questions about what was found during the examination.  If the procedure report does not answer your questions, please call your gastroenterologist to clarify.  If you requested that your care partner not be given the details of your procedure findings, then the procedure report has been included in a sealed envelope for you to review at your convenience later.  YOU SHOULD EXPECT: Some feelings of bloating in the abdomen. Passage of more gas than usual.  Walking can help get rid of the air that was put into your GI tract during the procedure and reduce the bloating. If you had a lower endoscopy (such as a colonoscopy or flexible sigmoidoscopy) you may notice spotting of blood in your stool or on the toilet paper. If you underwent a bowel prep for your procedure, you may not have a normal bowel movement for a few days.  Please Note:  You might notice some irritation and congestion in your nose or some drainage.  This is from the oxygen used during your procedure.  There is no need for concern and it should clear up in a day or so.  SYMPTOMS TO REPORT IMMEDIATELY:  Following lower endoscopy (colonoscopy or flexible sigmoidoscopy):  Excessive amounts of blood in the stool  Significant tenderness or worsening of abdominal pains  Swelling of the abdomen that is new, acute  Fever of 100F or higher  For urgent or emergent  issues, a gastroenterologist can be reached at any hour by calling [phone]. Do not use MyChart messaging for urgent concerns.    DIET:  We do recommend a small meal at first, but then you may proceed to your regular diet.  Drink plenty of fluids but you should avoid alcoholic beverages for 24 hours.  ACTIVITY:  You should plan to take it easy for the rest of today and you should NOT DRIVE or use heavy machinery until tomorrow (because of the sedation medicines used during the test).    FOLLOW UP: Our staff will call the number listed on your records the next business day following your procedure.  We will call around 7:15- 8:00 am to check on you and address  any questions or concerns that you may have regarding the information given to you following your procedure. If we do not reach you, we will leave a message.     If any biopsies were taken you will be contacted by phone or by letter within the next 1-3 weeks.  Please call us at [phone] if you have not heard about the biopsies in 3 weeks.    SIGNATURES/CONFIDENTIALITY: You and/or your care partner have signed paperwork which will be entered into your electronic medical record.  These signatures attest to the fact that that the information above on your After Visit Summary has been reviewed and is understood.  Full responsibility of the confidentiality of this discharge information lies with you and/or your care-partner.

## 2024-02-21 NOTE — Progress Notes (Signed)
 Pt's states no medical or surgical changes since previsit or office visit.

## 2024-02-21 NOTE — Progress Notes (Signed)
 [facility] History and Physical   Primary Care Physician:  Shelva Majestic, MD   Reason for Procedure:  History of adenomatous colon polyps  Plan:    Surveillance colonoscopy with possible interventions as needed     HPI: [name] is a very pleasant 78 y.o. male here for surveillance colonoscopy. Denies any nausea, vomiting, abdominal pain, melena or bright red blood per rectum  The risks and benefits as well as alternatives of endoscopic procedure(s) have been discussed and reviewed. All questions answered. The patient agrees to proceed.    Past Medical History:  Diagnosis Date   Abscess    right posterior neck   Anticoagulation adequate 06/07/2019   Overview:  SSS, paroxAFib/Flutter, CHADSvasc= 3 age, h/o cva; chronic OAC Xarelto   Atrial fibrillation and flutter (HCC) 01/01/2015   Noted 2016. Xarelto    BPH associated with nocturia 08/14/2014   Finasteride, flomax.  0-3x nocturia. PSA to 14-->eval urology [facility]. PSA trended back down to about 2.6 per records in 2015 then placed on finasteride- had significant voiding issues when spiked to 14- improved on meds    Bradycardia    CAD (coronary artery disease) 05/26/2016   Coronary CTA_ 50-75% D1 off CT report    Cardiac pacemaker in situ 12/28/2009   SA node dysfunction as cause    Chest pressure 12/01/2015   Overview:  atypical CP, [facility] nuclear perfusion imaging w/ no inarct, no ischemia, EF-40% NICM   Chronic systolic heart failure (HCC) 12/01/2015   Overview:  [facility] nuclear perfusion imaging study, no inarct, no ischemia, EF-40% NICM   CVA (cerebral vascular accident) (HCC)    Double vision 07/04/2018   Elevated LFTs    Erectile dysfunction 01/30/2015   cialis prn    External hemorrhoids    Fitting or adjustment of cardiac pacemaker 06/07/2019   Overview:  [place] DR [id number], RA and RV leads [id number]:  [id number] and [id number]   Former smoker 01/30/2015   Former smoker. 20 pack years quit 92. Patient  declined AAA screen.     GERD (gastroesophageal reflux disease)    Gout 11/11/2009   Qualifier: Diagnosis of  By: Oswald Hillock     History of nuclear stress test 06/07/2019   Overview:  [facility], no inarct, no ischemia, EF-40% NICM   HTN (hypertension)    Hyperglycemia 06/10/2017   110s CBG 2018   Hyperlipidemia 07/12/2007   Crestor 20mg  daily with some myalgias; fish oil. Trig 200-500 despite this. LDL ok.        Insomnia 01/30/2015   ambien 5mg  prn. May refill.     Ischemic optic neuropathy    Left chest pressure 03/19/2015   Other and unspecified hyperlipidemia    Sinoatrial node dysfunction (HCC)    Spinal stenosis    Trigger finger, right middle finger 03/01/2018    Past Surgical History:  Procedure Laterality Date   ABSCESS DRAINAGE     righ tposterior neck   CARDIAC CATHETERIZATION  1996   CHOLECYSTECTOMY  02/07/2003   COLONOSCOPY  06/23/2004   EYE SURGERY     left eye   left ingunial hernia  06/08/2011   PACEMAKER INSERTION     right hip replacement     2012 Dr. Despina Hick    Prior to Admission medications   Medication Sig Start Date End Date Taking? Authorizing Provider  benazepril (LOTENSIN) 5 MG tablet Take 1 tablet (5 mg total) by mouth daily. 03/03/23  Yes Shelva Majestic, MD  cyclobenzaprine [facility])  5 MG tablet Take 1 tablet (5 mg total) by mouth 3 (three) times daily as needed for muscle spasms. 01/24/24  Yes Richardean Sale, DO  fluconazole (DIFLUCAN) 150 MG tablet Take 150 mg by mouth once a week. 02/01/24 03/02/24 Yes [provider]  gabapentin (NEURONTIN) 400 MG capsule Take 1 capsule (400 mg total) by mouth 2 (two) times daily. 12/07/23  Yes Richardean Sale, DO  gemfibrozil (LOPID) 600 MG tablet TAKE 1 TABLET BY MOUTH TWICE DAILY BEFORE  A  MEAL 09/12/20  Yes Marinus Maw, MD  icosapent Ethyl (VASCEPA) 1 g capsule Take 1 g by mouth 2 (two) times daily.   Yes [provider]  metoprolol succinate (TOPROL-XL) 25 MG 24 hr tablet Take 1 tablet  (25 mg total) by mouth daily. 08/01/20  Yes Shelva Majestic, MD  Multiple Vitamin (MULTIVITAMIN) tablet Take 1 tablet by mouth daily.   Yes [provider]  omeprazole (PRILOSEC) 20 MG capsule Take 1 capsule by mouth once daily 01/31/24  Yes Shelva Majestic, MD  rosuvastatin (CRESTOR) 20 MG tablet Take 20 mg by mouth daily. 09/29/23  Yes [provider]  tamsulosin (FLOMAX) 0.4 MG CAPS capsule Take 1 capsule by mouth once daily 12/14/22  Yes Shelva Majestic, MD  indomethacin (INDOCIN) 25 MG capsule Take 1 capsule (25 mg total) by mouth 2 (two) times daily with a meal. 10/26/23   Shelva Majestic, MD  metoprolol tartrate (LOPRESSOR) 25 MG tablet Take 1 tablet (25 mg total) by mouth as needed. Take 1 tablet (25 mg total) as needed for heart rate greater than 120 05/14/23   Marinus Maw, MD  tadalafil (CIALIS) 5 MG tablet TAKE 1 TABLET BY MOUTH DAILY AS NEEDED FOR ERECTILE DYSFUNCTION 02/08/24   Shelva Majestic, MD  traMADol (ULTRAM) 50 MG tablet Take 1 tablet (50 mg total) by mouth every 6 (six) hours as needed for moderate pain (pain score 4-6) or severe pain (pain score 7-10). Patient not taking: Reported on 02/21/2024 10/08/23   Shelva Majestic, MD  triamcinolone cream (KENALOG) 0.1 % Apply 1 Application topically 2 (two) times daily. 02/01/24   [provider]  valACYclovir (VALTREX) 1000 MG tablet Take 1 tablet (1,000 mg total) by mouth 3 (three) times daily. For 7 days for shingles. 09/07/23   Allwardt, Crist Infante, PA-C  XARELTO 20 MG TABS tablet TAKE 1 TABLET BY MOUTH ONCE DAILY WITH SUPPER 12/04/19   Marinus Maw, MD  zolpidem (AMBIEN) 5 MG tablet Take 1 tablet (5 mg total) by mouth at bedtime. 03/08/23   Shelva Majestic, MD    Current Outpatient Medications  Medication Sig Dispense Refill   benazepril (LOTENSIN) 5 MG tablet Take 1 tablet (5 mg total) by mouth daily. 90 tablet 3   cyclobenzaprine (FLEXERIL) 5 MG tablet Take 1 tablet (5 mg total) by mouth 3 (three)  times daily as needed for muscle spasms. 90 tablet 0   fluconazole (DIFLUCAN) 150 MG tablet Take 150 mg by mouth once a week.     gabapentin (NEURONTIN) 400 MG capsule Take 1 capsule (400 mg total) by mouth 2 (two) times daily. 180 capsule 0   gemfibrozil (LOPID) 600 MG tablet TAKE 1 TABLET BY MOUTH TWICE DAILY BEFORE  A  MEAL 180 tablet 3   icosapent Ethyl (VASCEPA) 1 g capsule Take 1 g by mouth 2 (two) times daily.     metoprolol succinate (TOPROL-XL) 25 MG 24 hr tablet Take 1 tablet (  25 mg total) by mouth daily. 90 tablet 3   Multiple Vitamin (MULTIVITAMIN) tablet Take 1 tablet by mouth daily.     omeprazole (PRILOSEC) 20 MG capsule Take 1 capsule by mouth once daily 90 capsule 0   rosuvastatin (CRESTOR) 20 MG tablet Take 20 mg by mouth daily.     tamsulosin (FLOMAX) 0.4 MG CAPS capsule Take 1 capsule by mouth once daily 90 capsule 0   indomethacin (INDOCIN) 25 MG capsule Take 1 capsule (25 mg total) by mouth 2 (two) times daily with a meal. 60 capsule 3   metoprolol tartrate (LOPRESSOR) 25 MG tablet Take 1 tablet (25 mg total) by mouth as needed. Take 1 tablet (25 mg total) as needed for heart rate greater than 120 45 tablet 3   tadalafil (CIALIS) 5 MG tablet TAKE 1 TABLET BY MOUTH DAILY AS NEEDED FOR ERECTILE DYSFUNCTION 90 tablet 3   traMADol (ULTRAM) 50 MG tablet Take 1 tablet (50 mg total) by mouth every 6 (six) hours as needed for moderate pain (pain score 4-6) or severe pain (pain score 7-10). (Patient not taking: Reported on 02/21/2024) 20 tablet 0   triamcinolone cream (KENALOG) 0.1 % Apply 1 Application topically 2 (two) times daily.     valACYclovir (VALTREX) 1000 MG tablet Take 1 tablet (1,000 mg total) by mouth 3 (three) times daily. For 7 days for shingles. 21 tablet 0   XARELTO 20 MG TABS tablet TAKE 1 TABLET BY MOUTH ONCE DAILY WITH SUPPER 90 tablet 2   zolpidem (AMBIEN) 5 MG tablet Take 1 tablet (5 mg total) by mouth at bedtime. 90 tablet 1   Current Facility-Administered  Medications  Medication Dose Route Frequency Provider Last Rate Last Admin   0.9 %  sodium chloride infusion  500 mL Intravenous Once Napoleon Form, MD        Allergies as of 02/21/2024   (No Known Allergies)    Family History  Problem Relation Age of Onset   Lung cancer Mother        mets to brain   Heart attack Mother 35   Hypertension Mother    Brain cancer Mother    Heart attack Father        mid 60s, smoker.    Diabetes Father    Cancer Brother    Diabetes Paternal Grandfather    Colon cancer Neg Hx    Esophageal cancer Neg Hx    Stomach cancer Neg Hx    Rectal cancer Neg Hx     Social History   Socioeconomic History   Marital status: Married    Spouse name: Not on file   Number of children: 1   Years of education: 20   Highest education level: Not on file  Occupational History   Not on file  Tobacco Use   Smoking status: Former    Current packs/day: 0.00    Average packs/day: 1 pack/day for 20.0 years (20.0 ttl pk-yrs)    Types: Cigarettes    Start date: 11/04/1971    Quit date: 11/04/1991    Years since quitting: 32.3   Smokeless tobacco: Never  Vaping Use   Vaping status: Never Used  Substance and Sexual Activity   Alcohol use: Yes    Alcohol/week: 0.0 standard drinks of alcohol    Comment: occ   Drug use: No   Sexual activity: Not on file  Other Topics Concern   Not on file  Social History Narrative   Married. 1 child  and 1 step child. 2 grandchildren.       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      Hobbies: swimming (states not fun), travel      Lives in a single story home   Social Drivers of Health   Financial Resource Strain: Low Risk  (02/22/2023)   Overall Financial Resource Strain (CARDIA)    Difficulty of Paying Living Expenses: Not hard at all  Food Insecurity: No Food Insecurity (02/22/2023)   Hunger Vital Sign    Worried About Running Out of Food in the Last Year: Never true    Ran Out of Food in the Last Year: Never true   Transportation Needs: No Transportation Needs (02/22/2023)   PRAPARE - Administrator, Civil Service (Medical): No    Lack of Transportation (Non-Medical): No  Physical Activity: Sufficiently Active (02/22/2023)   Exercise Vital Sign    Days of Exercise per Week: 4 days    Minutes of Exercise per Session: 60 min  Stress: No Stress Concern Present (02/22/2023)   [employer] of Occupational Health - Occupational Stress Questionnaire    Feeling of Stress : Not at all  Social Connections: Moderately Integrated (02/22/2023)   Social Connection and Isolation Panel [NHANES]    Frequency of Communication with Friends and Family: More than three times a week    Frequency of Social Gatherings with Friends and Family: More than three times a week    Attends Religious Services: Never    Database administrator or Organizations: Yes    Attends Banker Meetings: 1 to 4 times per year    Marital Status: Married  Catering manager Violence: Not At Risk (02/22/2023)   Humiliation, Afraid, Rape, and Kick questionnaire    Fear of Current or Ex-Partner: No    Emotionally Abused: No    Physically Abused: No    Sexually Abused: No    Review of Systems:  All other review of systems negative except as mentioned in the HPI.  Physical Exam: Vital signs in last 24 hours: BP (!) 157/79   Pulse 60   Temp (!) 97.5 F (36.4 C)   Ht 5\' 8"  (1.727 m)   Wt 175 lb (79.4 kg)   SpO2 95%   BMI 26.61 kg/m  General:   Alert, NAD Lungs:  Clear .   Heart:  Regular rate and rhythm Abdomen:  Soft, nontender and nondistended. Neuro/Psych:  Alert and cooperative. Normal mood and affect. A and O x 3  Reviewed labs, radiology imaging, old records and pertinent past GI work up  Patient is appropriate for planned procedure(s) and anesthesia in an ambulatory setting   K. Scherry Ran , MD [phone]

## 2024-02-21 NOTE — Progress Notes (Signed)
 To pacu, VSS. Report to [email or web address]

## 2024-02-21 NOTE — Op Note (Addendum)
 [facility] Patient Name: [name] Procedure Date: 02/21/2024 10:52 AM MRN: [id number] Endoscopist: Napoleon Form , MD, [id number] Age: 78 Referring MD:  Date of Birth: 1946/03/25 Gender: Male Account #: [id number] Procedure:                Colonoscopy Indications:              High risk colon cancer surveillance: Personal                            history of adenoma (10 mm or greater in size), High                            risk colon cancer surveillance: Personal history of                            multiple (3 or more) adenomas Medicines:                Monitored Anesthesia Care Procedure:                Pre-Anesthesia Assessment:                           - Prior to the procedure, a History and Physical                            was performed, and patient medications and                            allergies were reviewed. The patient's tolerance of                            previous anesthesia was also reviewed. The risks                            and benefits of the procedure and the sedation                            options and risks were discussed with the patient.                            All questions were answered, and informed consent                            was obtained. Prior Anticoagulants: The patient has                            taken no anticoagulant or antiplatelet agents. ASA                            Grade Assessment: II - A patient with mild systemic                            disease. After reviewing the risks and benefits,  the patient was deemed in satisfactory condition to                            undergo the procedure.                           After obtaining informed consent, the colonoscope                            was passed under direct vision. Throughout the                            procedure, the patient's blood pressure, pulse, and                            oxygen saturations  were monitored continuously. The                            Olympus Scope [id number] was introduced through the                            anus and advanced to the the cecum, identified by                            appendiceal orifice and ileocecal valve. The                            colonoscopy was performed without difficulty. The                            patient tolerated the procedure well. The quality                            of the bowel preparation was good. The ileocecal                            valve, appendiceal orifice, and rectum were                            photographed. Scope In: 10:56:00 AM Scope Out: 11:10:27 AM Scope Withdrawal Time: 0 hours 10 minutes 42 seconds  Total Procedure Duration: 0 hours 14 minutes 27 seconds  Findings:                 The perianal and digital rectal examinations were                            normal.                           A 4 mm polyp was found in the sigmoid colon. The                            polyp was sessile. The polyp was removed with a  cold snare. Resection and retrieval were complete.                           Scattered small-mouthed diverticula were found in                            the sigmoid colon and descending colon.                           Non-bleeding external and internal hemorrhoids were                            found during retroflexion. The hemorrhoids were                            medium-sized. Complications:            No immediate complications. Estimated Blood Loss:     Estimated blood loss was minimal. Impression:               - One 4 mm polyp in the sigmoid colon, removed with                            a cold snare. Resected and retrieved.                           - Diverticulosis in the sigmoid colon and in the                            descending colon.                           - Non-bleeding external and internal hemorrhoids. Recommendation:           -  Patient has a contact number available for                            emergencies. The signs and symptoms of potential                            delayed complications were discussed with the                            patient. Return to normal activities tomorrow.                            Written discharge instructions were provided to the                            patient.                           - Resume previous diet.                           - Continue present medications.                           -  Await pathology results.                           - No repeat colonoscopy due to age.                           - Resume Xarelto (rivaroxaban) at prior dose today.                            Refer to managing physician for further adjustment                            of therapy. Napoleon Form, MD 02/21/2024 11:25:32 AM This report has been signed electronically.

## 2024-02-22 ENCOUNTER — Telehealth: Payer: Self-pay

## 2024-02-22 ENCOUNTER — Ambulatory Visit (INDEPENDENT_AMBULATORY_CARE_PROVIDER_SITE_OTHER)

## 2024-02-22 VITALS — Ht 68.0 in | Wt 175.0 lb

## 2024-02-22 DIAGNOSIS — Z0000 Encounter for general adult medical examination without abnormal findings: Secondary | ICD-10-CM | POA: Diagnosis not present

## 2024-02-22 NOTE — Telephone Encounter (Signed)
 No answer, unable to leave a message, B.Kytzia Gienger RN.

## 2024-02-22 NOTE — Patient Instructions (Signed)
 [name] , Thank you for taking time to come for your Medicare Wellness Visit. I appreciate your ongoing commitment to your health goals. Please review the following plan we discussed and let me know if I can assist you in the future.   Referrals/Orders/Follow-Ups/Clinician Recommendations: Aim for 30 minutes of exercise or brisk walking, 6-8 glasses of water, and 5 servings of fruits and vegetables each day.   This is a list of the screening recommended for you and due dates:  Health Maintenance  Topic Date Due   Medicare Annual Wellness Visit  02/22/2024   COVID-19 Vaccine (5 - Moderna risk 2024-25 season) 03/02/2024*   Zoster (Shingles) Vaccine (1 of 2) 05/17/2024*   Colon Cancer Screening  02/21/2027   Pneumonia Vaccine  Completed   Flu Shot  Completed   Hepatitis C Screening  Completed   HPV Vaccine  Aged Out   DTaP/Tdap/Td vaccine  Discontinued  *Topic was postponed. The date shown is not the original due date.    Advanced directives: (Copy Requested) Please bring a copy of your health care power of attorney and living will to the office to be added to your chart at your convenience. You can mail to [facility] [address] [place], [place] [address] or email to [email or web address]  Next Medicare Annual Wellness Visit scheduled for next year: Yes

## 2024-02-22 NOTE — Progress Notes (Signed)
 Subjective:   [name] is a 78 y.o. who presents for a Medicare Wellness preventive visit.  Visit Complete: Virtual I connected with  [name] on 02/22/24 by a audio enabled telemedicine application and verified that I am speaking with the correct person using two identifiers.  Patient Location: Home  Provider Location: Office/Clinic  I discussed the limitations of evaluation and management by telemedicine. The patient expressed understanding and agreed to proceed.  Vital Signs: Because this visit was a virtual/telehealth visit, some criteria may be missing or patient reported. Any vitals not documented were not able to be obtained and vitals that have been documented are patient reported.  VideoDeclined- This patient declined Librarian, academic. Therefore the visit was completed with audio only.  Persons Participating in Visit: Patient.  AWV Questionnaire: Yes: Patient Medicare AWV questionnaire was completed by the patient on 02/21/24; I have confirmed that all information answered by patient is correct and no changes since this date.  Cardiac Risk Factors include: advanced age (>44men, >88 women);dyslipidemia;male gender;hypertension     Objective:    Today's Vitals   02/22/24 1354  Weight: 175 lb (79.4 kg)  Height: 5\' 8"  (1.727 m)   Body mass index is 26.61 kg/m.     02/22/2024    1:58 PM 02/22/2023    2:39 PM 02/19/2022    3:46 PM 02/15/2020    1:37 PM 06/07/2019    7:35 AM 07/06/2017    2:36 PM  Advanced Directives  Does Patient Have a Medical Advance Directive? Yes Yes Yes Yes No Yes  Type of Estate agent of [place];Living will Healthcare Power of [place];Living will Healthcare Power of Attorney Living will;Healthcare Power of Attorney    Does patient want to make changes to medical advance directive?    No - Patient declined    Copy of Healthcare Power of Attorney in Chart? No - copy requested No -  copy requested No - copy requested No - copy requested      Current Medications (verified) Outpatient Encounter Medications as of 02/22/2024  Medication Sig   benazepril (LOTENSIN) 5 MG tablet Take 1 tablet (5 mg total) by mouth daily.   cyclobenzaprine (FLEXERIL) 5 MG tablet Take 1 tablet (5 mg total) by mouth 3 (three) times daily as needed for muscle spasms.   fluconazole (DIFLUCAN) 150 MG tablet Take 150 mg by mouth once a week.   gabapentin (NEURONTIN) 400 MG capsule Take 1 capsule (400 mg total) by mouth 2 (two) times daily.   gemfibrozil (LOPID) 600 MG tablet TAKE 1 TABLET BY MOUTH TWICE DAILY BEFORE  A  MEAL   icosapent Ethyl (VASCEPA) 1 g capsule Take 1 g by mouth 2 (two) times daily.   indomethacin (INDOCIN) 25 MG capsule Take 1 capsule (25 mg total) by mouth 2 (two) times daily with a meal.   metoprolol succinate (TOPROL-XL) 25 MG 24 hr tablet Take 1 tablet (25 mg total) by mouth daily.   metoprolol tartrate (LOPRESSOR) 25 MG tablet Take 1 tablet (25 mg total) by mouth as needed. Take 1 tablet (25 mg total) as needed for heart rate greater than 120   Multiple Vitamin (MULTIVITAMIN) tablet Take 1 tablet by mouth daily.   omeprazole (PRILOSEC) 20 MG capsule Take 1 capsule by mouth once daily   rosuvastatin (CRESTOR) 20 MG tablet Take 20 mg by mouth daily.   tadalafil (CIALIS) 5 MG tablet TAKE 1 TABLET BY MOUTH DAILY AS NEEDED FOR  ERECTILE DYSFUNCTION   tamsulosin (FLOMAX) 0.4 MG CAPS capsule Take 1 capsule by mouth once daily   XARELTO 20 MG TABS tablet TAKE 1 TABLET BY MOUTH ONCE DAILY WITH SUPPER   zolpidem (AMBIEN) 5 MG tablet Take 1 tablet (5 mg total) by mouth at bedtime.   [DISCONTINUED] traMADol (ULTRAM) 50 MG tablet Take 1 tablet (50 mg total) by mouth every 6 (six) hours as needed for moderate pain (pain score 4-6) or severe pain (pain score 7-10). (Patient not taking: Reported on 02/21/2024)   [DISCONTINUED] triamcinolone cream (KENALOG) 0.1 % Apply 1 Application topically 2  (two) times daily.   [DISCONTINUED] valACYclovir (VALTREX) 1000 MG tablet Take 1 tablet (1,000 mg total) by mouth 3 (three) times daily. For 7 days for shingles.   No facility-administered encounter medications on file as of 02/22/2024.    Allergies (verified) Patient has no known allergies.   History: Past Medical History:  Diagnosis Date   Abscess    right posterior neck   Anticoagulation adequate 06/07/2019   Overview:  SSS, paroxAFib/Flutter, CHADSvasc= 3 age, h/o cva; chronic OAC Xarelto   Atrial fibrillation and flutter (HCC) 01/01/2015   Noted 2016. Xarelto    BPH associated with nocturia 08/14/2014   Finasteride, flomax.  0-3x nocturia. PSA to 14-->eval urology [facility]. PSA trended back down to about 2.6 per records in 2015 then placed on finasteride- had significant voiding issues when spiked to 14- improved on meds    Bradycardia    CAD (coronary artery disease) 05/26/2016   Coronary CTA_ 50-75% D1 off CT report    Cardiac pacemaker in situ 12/28/2009   SA node dysfunction as cause    Chest pressure 12/01/2015   Overview:  atypical CP, [facility] nuclear perfusion imaging w/ no inarct, no ischemia, EF-40% NICM   Chronic systolic heart failure (HCC) 12/01/2015   Overview:  [facility] nuclear perfusion imaging study, no inarct, no ischemia, EF-40% NICM   CVA (cerebral vascular accident) (HCC)    Double vision 07/04/2018   Elevated LFTs    Erectile dysfunction 01/30/2015   cialis prn    External hemorrhoids    Fitting or adjustment of cardiac pacemaker 06/07/2019   Overview:  Biotronik [place] DR [id number], RA and RV leads [id number]:  [id number] and [id number]   Former smoker 01/30/2015   Former smoker. 20 pack years quit 92. Patient declined AAA screen.     GERD (gastroesophageal reflux disease)    Gout 11/11/2009   Qualifier: Diagnosis of  By: Oswald Hillock     History of nuclear stress test 06/07/2019   Overview:  [facility], no inarct, no ischemia, EF-40% NICM   HTN (hypertension)    Hyperglycemia 06/10/2017    110s CBG 2018   Hyperlipidemia 07/12/2007   Crestor 20mg  daily with some myalgias; fish oil. Trig 200-500 despite this. LDL ok.        Insomnia 01/30/2015   ambien 5mg  prn. May refill.     Ischemic optic neuropathy    Left chest pressure 03/19/2015   Other and unspecified hyperlipidemia    Sinoatrial node dysfunction (HCC)    Spinal stenosis    Trigger finger, right middle finger 03/01/2018   Past Surgical History:  Procedure Laterality Date   ABSCESS DRAINAGE     righ tposterior neck   CARDIAC CATHETERIZATION  1996   CHOLECYSTECTOMY  02/07/2003   COLONOSCOPY  06/23/2004   EYE SURGERY     left eye   left ingunial hernia  06/08/2011   PACEMAKER INSERTION  right hip replacement     2012 Dr. Despina Hick   Family History  Problem Relation Age of Onset   Lung cancer Mother        mets to brain   Heart attack Mother 62   Hypertension Mother    Brain cancer Mother    Heart attack Father        mid 65s, smoker.    Diabetes Father    Cancer Brother    Diabetes Paternal Grandfather    Colon cancer Neg Hx    Esophageal cancer Neg Hx    Stomach cancer Neg Hx    Rectal cancer Neg Hx    Social History   Socioeconomic History   Marital status: Married    Spouse name: Not on file   Number of children: 1   Years of education: 20   Highest education level: Professional school degree (e.g., MD, DDS, DVM, JD)  Occupational History   Not on file  Tobacco Use   Smoking status: Former    Current packs/day: 0.00    Average packs/day: 1 pack/day for 20.0 years (20.0 ttl pk-yrs)    Types: Cigarettes    Start date: 11/04/1971    Quit date: 11/04/1991    Years since quitting: 32.3   Smokeless tobacco: Never  Vaping Use   Vaping status: Never Used  Substance and Sexual Activity   Alcohol use: Yes    Alcohol/week: 0.0 standard drinks of alcohol    Comment: occ   Drug use: No   Sexual activity: Not on file  Other Topics Concern   Not on file  Social History Narrative   Married. 1  child and 1 step child. 2 grandchildren.       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      Hobbies: swimming (states not fun), travel      Lives in a single story home   Social Drivers of Health   Financial Resource Strain: Low Risk  (02/21/2024)   Overall Financial Resource Strain (CARDIA)    Difficulty of Paying Living Expenses: Not hard at all  Food Insecurity: No Food Insecurity (02/21/2024)   Hunger Vital Sign    Worried About Running Out of Food in the Last Year: Never true    Ran Out of Food in the Last Year: Never true  Transportation Needs: No Transportation Needs (02/21/2024)   PRAPARE - Administrator, Civil Service (Medical): No    Lack of Transportation (Non-Medical): No  Physical Activity: Sufficiently Active (02/21/2024)   Exercise Vital Sign    Days of Exercise per Week: 3 days    Minutes of Exercise per Session: 60 min  Stress: No Stress Concern Present (02/21/2024)   [employer] of Occupational Health - Occupational Stress Questionnaire    Feeling of Stress : Not at all  Social Connections: Unknown (02/21/2024)   Social Connection and Isolation Panel [NHANES]    Frequency of Communication with Friends and Family: More than three times a week    Frequency of Social Gatherings with Friends and Family: Once a week    Attends Religious Services: Patient declined    Database administrator or Organizations: Yes    Attends Engineer, structural: More than 4 times per year    Marital Status: Married    Tobacco Counseling Counseling given: Not Answered    Clinical Intake:  Pre-visit preparation completed: Yes  Pain : 0-10 Pain Type: Chronic pain Pain Location: Leg Pain Orientation: Right Pain  Descriptors / Indicators: Aching Pain Onset: More than a month ago Pain Frequency: Intermittent     BMI - recorded: 26.61 Nutritional Status: BMI 25 -29 Overweight Nutritional Risks: None Diabetes: No  Lab Results  Component Value Date   HGBA1C 6.4  10/08/2023   HGBA1C 6.0 11/17/2022   HGBA1C 6.1 03/09/2022     How often do you need to have someone help you when you read instructions, pamphlets, or other written materials from your doctor or pharmacy?: 1 - Never  Interpreter Needed?: No  Information entered by :: Lanier Ensign, LPN   Activities of Daily Living     02/21/2024    8:12 PM  In your present state of health, do you have any difficulty performing the following activities:  Hearing? 0  Vision? 0  Difficulty concentrating or making decisions? 0  Walking or climbing stairs? 0  Dressing or bathing? 0  Doing errands, shopping? 0  Preparing Food and eating ? N  Using the Toilet? N  In the past six months, have you accidently leaked urine? N  Do you have problems with loss of bowel control? N  Managing your Medications? N  Managing your Finances? N  Housekeeping or managing your Housekeeping? N    Patient Care Team: Shelva Majestic, MD as PCP - General (Family Medicine) Marinus Maw, MD as PCP - Cardiology (Cardiology) Glendale Chard, DO as Consulting Physician (Neurology) Marinus Maw, MD as Consulting Physician (Cardiology) Pandora Leiter (Inactive) as Consulting Physician (Cardiology) Levie Heritage III, MD as Consulting Physician (Ophthalmology)  Indicate any recent Medical Services you may have received from other than [facility] providers in the past year (date may be approximate).     Assessment:   This is a routine wellness examination for [place].  Hearing/Vision screen Hearing Screening - Comments:: Pt denies any hearing issues  Vision Screening - Comments:: Pt [facility] exams    Goals Addressed             This Visit's Progress    Patient Stated       Maintain health and activity        Depression Screen     02/22/2024    1:58 PM 10/08/2023   10:00 AM 03/08/2023    9:02 AM 02/22/2023    2:38 PM 02/19/2022    3:45 PM 09/16/2021    11:47 AM 08/01/2020    2:26 PM  PHQ 2/9 Scores  PHQ - 2 Score 0 0 0 0 0 0 0  PHQ- 9 Score   1    0    Fall Risk     02/21/2024    8:12 PM 10/08/2023   10:00 AM 03/08/2023    9:02 AM 02/22/2023    2:40 PM 02/19/2022    3:47 PM  Fall Risk   Falls in the past year? 0 0 0 0 0  Number falls in past yr: 0 0 0 0 0  Injury with Fall? 0 0 0 0 0  Risk for fall due to : No Fall Risks No Fall Risks No Fall Risks Impaired vision Impaired vision  Follow up Falls prevention discussed Falls evaluation completed Falls evaluation completed Falls prevention discussed Falls prevention discussed    MEDICARE RISK AT HOME:  Medicare Risk at Home Any stairs in or around the home?: (Patient-Rptd) No If so, are there any without handrails?: (Patient-Rptd) No Home free of loose throw rugs in walkways,  pet beds, electrical cords, etc?: (Patient-Rptd) Yes Adequate lighting in your home to reduce risk of falls?: (Patient-Rptd) Yes Life alert?: (Patient-Rptd) No Use of a cane, walker or w/c?: (Patient-Rptd) No Grab bars in the bathroom?: (Patient-Rptd) No Shower chair or bench in shower?: (Patient-Rptd) No Elevated toilet seat or a handicapped toilet?: (Patient-Rptd) No  TIMED UP AND GO:  Was the test performed?  No  Cognitive Function: 6CIT completed    07/06/2017    2:45 PM  MMSE - Mini Mental State Exam  Not completed: --        02/22/2024    2:00 PM 02/22/2023    2:41 PM 02/19/2022    3:49 PM 02/15/2020    1:38 PM  6CIT Screen  What Year? 0 points 0 points 0 points 0 points  What month? 0 points 0 points 0 points 0 points  What time? 0 points 0 points 0 points 0 points  Count back from 20 0 points 0 points 0 points 0 points  Months in reverse 0 points 0 points 0 points 0 points  Repeat phrase 0 points 2 points 0 points 0 points  Total Score 0 points 2 points 0 points 0 points    Immunizations Immunization History  Administered Date(s) Administered   Fluad Quad(high Dose 65+) 08/01/2021,  08/17/2022   Hepatitis A, Adult 08/08/2015, 05/26/2016   Influenza Split 11/09/2011, 09/20/2012, 09/12/2018   Influenza Whole 11/14/2007   Influenza, High Dose Seasonal PF 08/08/2015, 09/02/2017, 08/31/2019, 09/18/2020, 08/01/2023   Influenza,inj,Quad PF,6+ Mos 09/14/2013, 12/27/2014, 09/12/2018   Influenza-Unspecified 08/22/2019   Moderna Covid-19 Fall Seasonal Vaccine 11yrs & older 08/17/2022   Moderna Sars-Covid-2 Vaccination 12/26/2019, 02/01/2020   Pfizer(Comirnaty)Fall Seasonal Vaccine 12 years and older 08/01/2023   Pneumococcal Conjugate-13 01/30/2015   Pneumococcal Polysaccharide-23 11/09/2011   Td 10/04/2008    Screening Tests Health Maintenance  Topic Date Due   COVID-19 Vaccine (5 - Moderna risk 2024-25 season) 03/02/2024 (Originally 01/29/2024)   Zoster Vaccines- Shingrix (1 of 2) 05/17/2024 (Originally 10/18/1965)   Medicare Annual Wellness (AWV)  02/21/2025   Colonoscopy  02/21/2027   Pneumonia Vaccine 37+ Years old  Completed   INFLUENZA VACCINE  Completed   Hepatitis C Screening  Completed   HPV VACCINES  Aged Out   DTaP/Tdap/Td  Discontinued    Health Maintenance  There are no preventive care reminders to display for this patient.  Health Maintenance Items Addressed: See Nurse Notes  Additional Screening:  Vision Screening: Recommended annual ophthalmology exams for early detection of glaucoma and other disorders of the eye.  Dental Screening: Recommended annual dental exams for proper oral hygiene  Community Resource Referral / Chronic Care Management: CRR required this visit?  No   CCM required this visit?  No     Plan:     I have personally reviewed and noted the following in the patient's chart:   Medical and social history Use of alcohol, tobacco or illicit drugs  Current medications and supplements including opioid prescriptions. Patient is not currently taking opioid prescriptions. Functional ability and status Nutritional  status Physical activity Advanced directives List of other physicians Hospitalizations, surgeries, and ER visits in previous 12 months Vitals Screenings to include cognitive, depression, and falls Referrals and appointments  In addition, I have reviewed and discussed with patient certain preventive protocols, quality metrics, and best practice recommendations. A written personalized care plan for preventive services as well as general preventive health recommendations were provided to patient.     Verdon Cummins  Zanylah Hardie, LPN   1/61/0960   After Visit Summary: (MyChart) Due to this being a telephonic visit, the after visit summary with patients personalized plan was offered to patient via MyChart   Notes: Nothing significant to report at this time.

## 2024-02-23 LAB — SURGICAL PATHOLOGY

## 2024-02-28 ENCOUNTER — Other Ambulatory Visit: Payer: Self-pay | Admitting: Sports Medicine

## 2024-02-28 DIAGNOSIS — M47816 Spondylosis without myelopathy or radiculopathy, lumbar region: Secondary | ICD-10-CM

## 2024-02-28 DIAGNOSIS — G8929 Other chronic pain: Secondary | ICD-10-CM

## 2024-02-28 DIAGNOSIS — M51362 Other intervertebral disc degeneration, lumbar region with discogenic back pain and lower extremity pain: Secondary | ICD-10-CM

## 2024-03-01 DIAGNOSIS — Z79899 Other long term (current) drug therapy: Secondary | ICD-10-CM | POA: Diagnosis not present

## 2024-03-17 ENCOUNTER — Other Ambulatory Visit: Payer: Self-pay | Admitting: Family Medicine

## 2024-03-21 DIAGNOSIS — R3912 Poor urinary stream: Secondary | ICD-10-CM | POA: Diagnosis not present

## 2024-03-21 DIAGNOSIS — R972 Elevated prostate specific antigen [PSA]: Secondary | ICD-10-CM | POA: Diagnosis not present

## 2024-03-21 DIAGNOSIS — N401 Enlarged prostate with lower urinary tract symptoms: Secondary | ICD-10-CM | POA: Diagnosis not present

## 2024-03-21 DIAGNOSIS — N5201 Erectile dysfunction due to arterial insufficiency: Secondary | ICD-10-CM | POA: Diagnosis not present

## 2024-04-06 NOTE — Progress Notes (Signed)
 Cardiology Office Note Date:  04/06/2024  Patient ID:  [name], [name] [birth year], MRN [id number] PCP:  Almira Jaeger, MD  Cardiologist: D. Vemulapalli Electrophysiologist: Dr. Carolynne Citron    Chief Complaint:   palpitations, elevated HRs  History of Present Illness: [name] is a 78 y.o. male with history of HTN, HLD, AFib, stroke (post cath), SND w/PPM, CAD, , VHD (bicuspid AV), AFib/flutter, Chronic CHF (systolic (recovered EF), NICM  He saw Dr. Jene Minors 06/29/22, continues to swim, though less with more vacationing, report that he gets occasional episodes where he feels like his heart rate drops when he swims and he is not able to exert himself as much until it comes back to normal a few minutes later. He is following with electrophysiology at [facility] and has been having interrogations of his pacemaker.  Discussed CT scan report with perhaps exception of the 75% D1. Size of D1 not clear, and given prior CVA with LHC high threshold for intervention: plan to pursue aggressive risk factor management. Prior cMRI showed an infarct in the diag distribution but no significant peri-infarct ischemia or ischemia in the RCA or LCX territories. He has mild CP while walking, but no CP with more rigorous exercise, which argues against ischemic etiology of his sx. Lipid panel much improved since starting the icosopent ethyl.  No changes were made.   He saw Dr. Carolynne Citron 11/17/22, doing well, swimming, noted PVCs, good exercise tolerance and chronotropic response.  NO changes were made.  Needs clearance for epidural RCRI score is 3 (for some degree of CAD and prior stroke), 11% risk  I saw him 02/12/23 In the last couple weeks an observation of elevated HRs unusually 90's-110's at rest, not sustained, perhaps associated with a bit of jittery feeling. Doesn't feel like he is sleeping well. He has his back pain, actually better the last couple days, but continues to swim No CP No  SOB No near syncope or syncope. Does say that when at peak exercise in the pool he does tend to hit a wall with a sudden fall in HR, has to stop, rest on the lane divider. He says Dr. Carolynne Citron has felt that was his PVCs. His EKGs #1 comes in appears an ATach/ectopic atrial rhythm/V paced rhythm 96bpm #2 is AP/Vsensed 60bpm, diffuse T changes, Initially wondered if he was reaching a block rate based on his c/o with swimming but his HR histograms did not suggest that His watch data shows him getting to the 140's  Discussed increasing Toprol , he wanted to hold off Felt he was reasonable cardiac candidate for procedure planned Recommended a 3 mo visit  Phone calls with pt observation of his resting HR 90, much different then the 60's he is accompanied to seeing, fluttering  I saw him 03/26/23 He reports the same exertional limitations, swims regularly and still getting to that wall that he has to stop but not every time he swims. He will when walking with his wife sometimes have to turn back early because he feels "OFF"   Doesn't seem to be walking at a very brisk pace, mentions his wife has to stop fairly frequently He is much more aware in the last few months and especially of late that his resting heart rate is 90's-100. This he is quite uncomfortable with, seems mostly by his watch observations, not particularly symptomatic But it the change for sure is worrisome to him. NO CP No palpitations No near syncope or syncope  He does have retrograde conduction and is believed that his AS/VP EKG from last visit was in fact PMT despite the slower rate His retrograde conduction is 324-363, with faster V pacing retrograde slows/seems to wenckebach Today he is dependent at 40 VP 82%  AP 48% Given already his VP% and at least today is dependent With baseline programming his 2:1 block rate is 109 and histograms do reflect abrupt drops in HRs c/w this and his symptoms In d/w industry And in d/w  patient He comes today mostly 2/2 new faster resting rates We opted to  Fix AV delay at 180 and push out PVARP to 370 This should cover retrograde and keeps his block rate unchanged   Pt reached out after this feeling poorly, worse with programming changes  He saw Suzann 03/31/23, patient pending an internmational trip, discussed placing his setting back to where they were so he could travel comfortably Retrograde conduction re-measured at ~310.  Adjusted PVARP to . This allowed 2:1 block rate to be raised to 121bpm.  Settings in-line with March 2024 device settings  Saw Dr. Janee Mech 06/28/23, discussed palpitations, reported good exertional capacity, swimming, discussed trying short acting BB for his palpitations   He has seen Dr.Taylor a couple times since then Last 10/06/23, both visits report he was doing well, swimming, with observations of rates towards 120 with occasional palpitations This visit also notes rates 140s > suddenly to 60's reprogrammed his device to try to improve his rate response and prevent the drops in his HR. Could be PVC's. I had him walk in the halls with me and his HR increased appropriately into the 90's. We turned CLS off and he has standard rate response.   TODAY  He feels well mostly Adjustments that Dr. Carolynne Citron made did help him Less fluctuation in HRs He has noticed that his peak HR with swimming was typicaly 140's but is now in the 130's, wonders about that and why, with that observation he does think perhaps he is more winded with his swimming.  Discussed that we could try reducing his metoprolol  to try and given him more stamina, or adjust pacer further but the palpitations bother him more and no change to the Toprol  planned Adjustments to programming in the past have been problematic as well and I advised no further adjustments be made > he agrees.  No near syncope or syncope He has had 2 traumatic injuries (cut on his arm and leg), minor  traumas but did bleed and says it took 2 days to get it to stop bleeding completely  No bleeding or signs of bleeding otherwise  No CP, no SOB ar rest or ADLs    Device information Biotronik dual chamber PPM implanted 09/15/2002, gen change 01/09/2011 (Has MDT leads)   Past Medical History:  Diagnosis Date   Abscess    right posterior neck   Anticoagulation adequate 06/07/2019   Overview:  SSS, paroxAFib/Flutter, CHADSvasc= 3 age, h/o cva; chronic OAC Xarelto    Atrial fibrillation and flutter (HCC) 01/01/2015   Noted 2016. Xarelto     BPH associated with nocturia 08/14/2014   Finasteride , flomax .  0-3x nocturia. PSA to 14-->eval urology [facility]. PSA trended back down to about 2.6 per records in 2015 then placed on finasteride - had significant voiding issues when spiked to 14- improved on meds    Bradycardia    CAD (coronary artery disease) 05/26/2016   Coronary CTA_ 50-75% D1 off CT report    Cardiac pacemaker in situ 12/28/2009  SA node dysfunction as cause    Chest pressure 12/01/2015   Overview:  atypical CP, [facility] nuclear perfusion imaging w/ no inarct, no ischemia, EF-40% NICM   Chronic systolic heart failure (HCC) 12/01/2015   Overview:  [facility] nuclear perfusion imaging study, no inarct, no ischemia, EF-40% NICM   CVA (cerebral vascular accident) (HCC)    Double vision 07/04/2018   Elevated LFTs    Erectile dysfunction 01/30/2015   cialis  prn    External hemorrhoids    Fitting or adjustment of cardiac pacemaker 06/07/2019   Overview:  Biotronik [place] DR [id number], RA and RV leads [id number]:  [id number] and [id number]   Former smoker 01/30/2015   Former smoker. 20 pack years quit 92. Patient declined AAA screen.     GERD (gastroesophageal reflux disease)    Gout 11/11/2009   Qualifier: Diagnosis of  By: Alexander Anes     History of nuclear stress test 06/07/2019   Overview:  [facility], no inarct, no ischemia, EF-40% NICM   HTN (hypertension)    Hyperglycemia 06/10/2017   110s CBG 2018    Hyperlipidemia 07/12/2007   Crestor  20mg  daily with some myalgias; fish oil. Trig 200-500 despite this. LDL ok.        Insomnia 01/30/2015   ambien  5mg  prn. May refill.     Ischemic optic neuropathy    Left chest pressure 03/19/2015   Other and unspecified hyperlipidemia    Sinoatrial node dysfunction (HCC)    Spinal stenosis    Trigger finger, right middle finger 03/01/2018    Past Surgical History:  Procedure Laterality Date   ABSCESS DRAINAGE     righ tposterior neck   CARDIAC CATHETERIZATION  1996   CHOLECYSTECTOMY  02/07/2003   COLONOSCOPY  06/23/2004   EYE SURGERY     left eye   left ingunial hernia  06/08/2011   PACEMAKER INSERTION     right hip replacement     2012 Dr. Rossie Coon    Current Outpatient Medications  Medication Sig Dispense Refill   benazepril  (LOTENSIN ) 5 MG tablet Take 1 tablet by mouth once daily 90 tablet 0   cyclobenzaprine  (FLEXERIL ) 5 MG tablet Take 1 tablet by mouth three times daily as needed for muscle spasm 90 tablet 0   gabapentin  (NEURONTIN ) 400 MG capsule Take 1 capsule by mouth twice daily 180 capsule 0   gemfibrozil  (LOPID ) 600 MG tablet TAKE 1 TABLET BY MOUTH TWICE DAILY BEFORE  A  MEAL 180 tablet 3   icosapent Ethyl (VASCEPA) 1 g capsule Take 1 g by mouth 2 (two) times daily.     indomethacin  (INDOCIN ) 25 MG capsule Take 1 capsule (25 mg total) by mouth 2 (two) times daily with a meal. 60 capsule 3   metoprolol  succinate (TOPROL -XL) 25 MG 24 hr tablet Take 1 tablet (25 mg total) by mouth daily. 90 tablet 3   metoprolol  tartrate (LOPRESSOR ) 25 MG tablet Take 1 tablet (25 mg total) by mouth as needed. Take 1 tablet (25 mg total) as needed for heart rate greater than 120 45 tablet 3   Multiple Vitamin (MULTIVITAMIN) tablet Take 1 tablet by mouth daily.     omeprazole  (PRILOSEC) 20 MG capsule Take 1 capsule by mouth once daily 90 capsule 0   rosuvastatin  (CRESTOR ) 20 MG tablet Take 20 mg by mouth daily.     tadalafil  (CIALIS ) 5 MG tablet TAKE 1 TABLET  BY MOUTH DAILY AS NEEDED FOR ERECTILE DYSFUNCTION 90 tablet 3   tamsulosin  (FLOMAX ) 0.4 MG  CAPS capsule Take 1 capsule by mouth once daily 90 capsule 0   XARELTO  20 MG TABS tablet TAKE 1 TABLET BY MOUTH ONCE DAILY WITH SUPPER 90 tablet 2   zolpidem  (AMBIEN ) 5 MG tablet Take 1 tablet (5 mg total) by mouth at bedtime. 90 tablet 1   No current facility-administered medications for this visit.    Allergies:   Patient has no known allergies.   Social History:  The patient  reports that he quit smoking about 32 years ago. His smoking use included cigarettes. He started smoking about 52 years ago. He has a 20 pack-year smoking history. He has never used smokeless tobacco. He reports current alcohol use. He reports that he does not use drugs.   Family History:  The patient's family history includes Brain cancer in his mother; Cancer in his brother; Diabetes in his father and paternal grandfather; Heart attack in his father; Heart attack (age of onset: 34) in his mother; Hypertension in his mother; Lung cancer in his mother.  ROS:  Please see the history of present illness.    All other systems are reviewed and otherwise negative.   PHYSICAL EXAM:  VS:  There were no vitals taken for this visit. BMI: There is no height or weight on file to calculate BMI. Well nourished, well developed, in no acute distress HEENT: normocephalic, atraumatic Neck: no JVD, carotid bruits or masses Cardiac:  RRR; no significant murmurs, no rubs, or gallops Lungs: CTA b/l, no wheezing, rhonchi or rales Abd: soft, nontender MS: no deformity or atrophy Ext: no edema Skin: warm and dry, no rash Neuro:  No gross deficits appreciated Psych: euthymic mood, full affect  PPM site is stable, no tethering or discomfort   EKG:  not done today  Device interrogation done today and reviewed by myself:  Battery and lead measurements are good ~ 5 mo to ERI 3 very fleeting PAF episodes No HVR episodes    09/04/21: cardiac  MRI Comparison with prior stress cMRI (07/13/2016).  1. The left ventricle is normal in cavity size. There is mild basal sigmoid hypertrophy (max: 1.3 cm). The  basal anteroseptum is focally hypokinetic. The LVEF is preserved at 60% (prior 50%).  2. The right ventricle is normal in cavity size, wall thickness, and systolic function. A pacemaker lead is  seen traversing the tricuspid valve and terminating in the apical septum.  4. Both atria are normal in size. A pacemaker lead is seen in the right atrium, terminating in the right  atrial appendage.  4. The aortic valve is bicommissural in morphology.  There is a raphe between the non and right coronary  cusps. The leaflets are thickened.  There is mild aortic stenosis.  Peak aortic velocity measures 2.4 m/s  (prior 1.7 m/s) with peak/mean gradients of 23/10 mm Hg (prior mean gradient 6 mm Hg). Aortic valve area  measures 1.4 cm2 by planimetry.  There is mild aortic regurgitation.  5. There is mild thickening of the mitral valve leaflets. There is no significant mitral stenosis and mild  mitral regurgitation.  6. There is at least mild tricuspid regurgitation. Complete evaluation is limited by metal artifact.  7. There is mild pulmonic regurgitation.  8. Delayed enhancement MRI is abnormal.  There is hyperenhancement in the basal anteroseptum consistent  with a subendocardial myocardial infarction in the distribution of the LAD (~3% total LV myocardial mass).  The majority of the LAD perfusion territory is viable.  All other segments are fully viable.  9. There are no intracardiac thrombi.   There has been no significant change compared with prior cardiac MRI.   Chest MRA:   1. The aortic root is normal in size. The ascending aorta, arch, and descending aorta are normal in  diameter. There is no dissection seen. 2. The aortic arch is left sided. There is normal branching of the arch vessels.  3. The main and proximal branch pulmonary  arteries are normal in size.  4. Normal systemic venous connections.  5.  There is no evidence of an intracardiac thrombus.  6. The pericardium is normal in thickness.  There is no pericardial effusion.    Bi-orthogonal luminal aortic dimensions are listed below:   Sinus of Valsalva (largest dimension, cross-sectional dimension): 3.4 x 2.7 cm  Sinotubular junction: 2.8 x 2.3 cm  Mid-ascending aorta: 3.4 x 3.3 cm  Proximal aortic arch (aorta at the origin of the innominate artery):3.2 x 3.2 cm  Mid-aortic arch (between left common carotid and subclavian arteries): 2.8 x 2.7 cm  Proximal descending thoracic aorta (begins at the isthmus, approximately 2 cm distal to the left subclavian  artery): 2.4 x 2.3 cm  Mid-descending aorta: 2.4 x 2.3 cm  Aorta at diaphragm: 2.3 x 2.2 cm     04/29/21: EST Blood pressure demonstrated a normal response to exercise. There was no ST segment deviation noted during stress.   04/13/2018: TTE INTERPRETATION  NORMAL LV SYSTOLIC FUNCTION WITH MILD DIASTOLIC DYSFUNCTION  MILD, BICUSPID AORTIC VALVE STENOSIS  NORMAL RV SIZE AND FUNCTION  MILDLY ELEVATED PULMONARY PRESSURES  COMPARED WITH ECHO FROM 09/2016, AORTIC STENOSIS IS NOW MILD  RECOMMEND REPEAT ECHO IN 2-3 YEARS FOR AORTIC VALVE SURVEILLANCE.    02/03/2016: stress myoview  Nuclear stress EF: 40%. ST segment depression was noted during stress in the V5, V6, III and aVF leadsST deviation beginning in recovery. This is an intermediate risk study. The left ventricular ejection fraction is moderately decreased (30-44%).   08/19/15: TTE Study Conclusions  - Left ventricle: The cavity size was normal. There was moderate    concentric hypertrophy. Systolic function was normal. The    estimated ejection fraction was in the range of 60% to 65%. Wall    motion was normal; there were no regional wall motion    abnormalities. The study was not technically sufficient to allow    evaluation of LV diastolic  dysfunction due to atrial flutter.  - Aortic valve: Trileaflet; severely thickened, severely calcified    leaflets. Valve mobility was restricted. There was mild    regurgitation.  - Aortic root: The aortic root was normal in size.  - Mitral valve: Structurally normal valve.  - Left atrium: The atrium was normal in size.  - Right ventricle: Systolic function was normal.  - Right atrium: The atrium was normal in size.  - Tricuspid valve: There was trivial regurgitation.  - Pulmonary arteries: Systolic pressure was within the normal    range.  - Inferior vena cava: The vessel was normal in size. The    respirophasic diameter changes were in the normal range (= 50%),    consistent with normal central venous pressure.  - Pericardium, extracardiac: There was no pericardial effusion.    Recent Labs: 02/15/2024: ALT 14; BUN 15; Creatinine, Ser 1.26; Hemoglobin 14.1; Platelets 255.0; Potassium 4.2; Sodium 140  No results found for requested labs within last 365 days.   CrCl cannot be calculated (Patient's most recent lab result is older than the maximum 21 days allowed.).  Wt Readings from Last 3 Encounters:  02/22/24 175 lb (79.4 kg)  02/21/24 175 lb (79.4 kg)  02/15/24 175 lb 12.8 oz (79.7 kg)     Other studies reviewed: Additional studies/records reviewed today include: summarized above  ASSESSMENT AND PLAN:  PPM intact function no programming changes made   CAD No symptoms C/w his attending cardiologist  VHD, bicuspid AV Mild AS by last echo 2019 No significant murmur on exam No symptoms  Deferred to his attending cardiologist at [facility] (discussed with the patient today)  HTN Looks ok  5. Paroxysmal Afib CHA2DS2Vasc is 6, on Xarelto , appropriately dosed Labs are UTD <1 % burden  7. PVCs None noted today  8. Secondary hypercoagulable state     Disposition: back in 5 mo, he asks to see Dr. Carolynne Citron, will be close or at [facility] then, sooner if needed.   Current  medicines are reviewed at length with the patient today.  The patient did not have any concerns regarding medicines.  [place], PA-C 04/06/2024 6:09 PM     [facility] [address] [place] [address] [phone] (office)  [phone] (fax)

## 2024-04-07 ENCOUNTER — Encounter: Payer: Self-pay | Admitting: Physician Assistant

## 2024-04-07 ENCOUNTER — Ambulatory Visit: Attending: Physician Assistant | Admitting: Physician Assistant

## 2024-04-07 VITALS — BP 124/76 | HR 61 | Ht 68.0 in | Wt 176.0 lb

## 2024-04-07 DIAGNOSIS — Q2381 Bicuspid aortic valve: Secondary | ICD-10-CM

## 2024-04-07 DIAGNOSIS — D6869 Other thrombophilia: Secondary | ICD-10-CM | POA: Diagnosis not present

## 2024-04-07 DIAGNOSIS — Z950 Presence of cardiac pacemaker: Secondary | ICD-10-CM | POA: Diagnosis not present

## 2024-04-07 DIAGNOSIS — I480 Paroxysmal atrial fibrillation: Secondary | ICD-10-CM | POA: Diagnosis not present

## 2024-04-07 DIAGNOSIS — I2510 Atherosclerotic heart disease of native coronary artery without angina pectoris: Secondary | ICD-10-CM

## 2024-04-07 LAB — CUP PACEART INCLINIC DEVICE CHECK
Date Time Interrogation Session: 20250509164510
Implantable Lead Connection Status: 753985
Implantable Lead Connection Status: 753985
Implantable Lead Implant Date: 20031017
Implantable Lead Implant Date: 20031017
Implantable Lead Location: 753859
Implantable Lead Location: 753860
Implantable Lead Model: 5076
Implantable Lead Model: 5076
Implantable Pulse Generator Implant Date: 20120210
Lead Channel Pacing Threshold Amplitude: 0.8 V
Lead Channel Pacing Threshold Amplitude: 1.3 V
Lead Channel Pacing Threshold Pulse Width: 0.4 ms
Lead Channel Pacing Threshold Pulse Width: 0.4 ms
Lead Channel Sensing Intrinsic Amplitude: 16.6 mV
Pulse Gen Serial Number: 66063261

## 2024-04-07 NOTE — Patient Instructions (Signed)
 Medication Instructions:   Your physician recommends that you continue on your current medications as directed. Please refer to the Current Medication list given to you today.   *If you need a refill on your cardiac medications before your next appointment, please call your pharmacy*  Lab Work: NONE ORDERED  TODAY      If you have labs (blood work) drawn today and your tests are completely normal, you will receive your results only by: MyChart Message (if you have MyChart) OR A paper copy in the mail If you have any lab test that is abnormal or we need to change your treatment, we will call you to review the results.    Testing/Procedures: NONE ORDERED  TODAY     Follow-Up: At [facility], you and your health needs are our priority.  As part of our continuing mission to provide you with exceptional heart care, our providers are all part of one team.  This team includes your primary Cardiologist (physician) and Advanced Practice Providers or APPs (Physician Assistants and Nurse Practitioners) who all work together to provide you with the care you need, when you need it.   Your next appointment:    4 -5 month(s)  (END OF JULY EARLY SEPTEMBER) ( CONTACT  CASSIE HALL/ ANGELINE HAMMER FOR EP SCHEDULING ISSUES )    Provider:    Manya Sells, MD ONLY!!    We recommend signing up for the patient portal called "MyChart".  Sign up information is provided on this After Visit Summary.  MyChart is used to connect with patients for Virtual Visits (Telemedicine).  Patients are able to view lab/test results, encounter notes, upcoming appointments, etc.  Non-urgent messages can be sent to your provider as well.   To learn more about what you can do with MyChart, go to [email or web address].

## 2024-04-11 ENCOUNTER — Other Ambulatory Visit: Payer: Self-pay | Admitting: Sports Medicine

## 2024-04-11 ENCOUNTER — Ambulatory Visit: Payer: Self-pay | Admitting: Gastroenterology

## 2024-04-11 DIAGNOSIS — G8929 Other chronic pain: Secondary | ICD-10-CM

## 2024-04-11 DIAGNOSIS — M47816 Spondylosis without myelopathy or radiculopathy, lumbar region: Secondary | ICD-10-CM

## 2024-04-11 DIAGNOSIS — M51362 Other intervertebral disc degeneration, lumbar region with discogenic back pain and lower extremity pain: Secondary | ICD-10-CM

## 2024-04-14 ENCOUNTER — Other Ambulatory Visit: Payer: Self-pay | Admitting: Sports Medicine

## 2024-04-14 ENCOUNTER — Telehealth: Payer: Self-pay | Admitting: Sports Medicine

## 2024-04-14 DIAGNOSIS — M47816 Spondylosis without myelopathy or radiculopathy, lumbar region: Secondary | ICD-10-CM

## 2024-04-14 DIAGNOSIS — M51362 Other intervertebral disc degeneration, lumbar region with discogenic back pain and lower extremity pain: Secondary | ICD-10-CM

## 2024-04-14 DIAGNOSIS — G8929 Other chronic pain: Secondary | ICD-10-CM

## 2024-04-14 MED ORDER — CYCLOBENZAPRINE HCL 5 MG PO TABS: 5.0000 mg | ORAL_TABLET | ORAL | 0 refills | Status: DC | PRN

## 2024-04-14 NOTE — Telephone Encounter (Signed)
 Patient called and says he has about 20 cyclobenzaprine  left but he is about to leave for a month. He left a message with [facility] but he has to leave at noon today to go to for [place] to catch a flight. He is asking for a refill of 90 pills of cyclobenzaprine . He states that his pharmacy is [facility] on [address]. Please advise.

## 2024-04-14 NOTE — Telephone Encounter (Signed)
 Refill sent in

## 2024-05-17 NOTE — Progress Notes (Signed)
 Ben  [facility] [address] [place] [address] Phone: [phone]   Assessment and Plan:     There are no diagnoses linked to this encounter.  ***   Pertinent previous records reviewed include ***    Follow Up: ***     Subjective:   I, Catheryn Slifer, am serving as a Neurosurgeon for Doctor Ulysees Gander   Chief Complaint: low back pain    HPI:    01/28/2023 Patient is a 78 year old male complaining of low back pain. Patient states chronic intermittent pain 2-3 months ago he started having new pain that started in his thigh up to his butt and down to his knees , pain when standing , bending over, pain when coughing and sneezing, pain is a pressure feeling , he was taking indomethacin  , has been taking tylenol  does not seems to help , no numbness or tingling ,    05/12/2023 Patient states that he is not well, only had 2 weeks of relief from 1st epidural , 2nd epidural pain has come back and it was all movements    10/13/2023 Patient states that his pain has morphed into sciatica now an electrical shooting pain from is butt to to his legs.    12/07/2023 Patient states he was doing poorly up until Sunday he felt better. This morning he woke up and is in a lot of pain from his waist to his buttock   05/18/2024 Patient states   Relevant Historical Information: On chronic anticoagulation with Xarelto , hypertension, paroxysmal atrial fibrillation, CAD, CHF    Additional pertinent review of systems negative.   Current Outpatient Medications:    benazepril  (LOTENSIN ) 5 MG tablet, Take 1 tablet by mouth once daily, Disp: 90 tablet, Rfl: 0   cyclobenzaprine  (FLEXERIL ) 5 MG tablet, Take 1 tablet (5 mg total) by mouth as needed. for muscle spams, Disp: 90 tablet, Rfl: 0   gabapentin  (NEURONTIN ) 400 MG capsule, Take 1 capsule by mouth twice daily, Disp: 180 capsule, Rfl: 0   gemfibrozil  (LOPID ) 600 MG tablet, TAKE 1 TABLET BY MOUTH  TWICE DAILY BEFORE  A  MEAL, Disp: 180 tablet, Rfl: 3   icosapent Ethyl (VASCEPA) 1 g capsule, Take 1 g by mouth 2 (two) times daily., Disp: , Rfl:    indomethacin  (INDOCIN ) 25 MG capsule, Take 1 capsule (25 mg total) by mouth 2 (two) times daily with a meal., Disp: 60 capsule, Rfl: 3   metoprolol  succinate (TOPROL -XL) 25 MG 24 hr tablet, Take 1 tablet (25 mg total) by mouth daily., Disp: 90 tablet, Rfl: 3   metoprolol  tartrate (LOPRESSOR ) 25 MG tablet, Take 1 tablet (25 mg total) by mouth as needed. Take 1 tablet (25 mg total) as needed for heart rate greater than 120, Disp: 45 tablet, Rfl: 3   Multiple Vitamin (MULTIVITAMIN) tablet, Take 1 tablet by mouth daily., Disp: , Rfl:    omeprazole  (PRILOSEC) 20 MG capsule, Take 1 capsule by mouth once daily, Disp: 90 capsule, Rfl: 0   rosuvastatin  (CRESTOR ) 20 MG tablet, Take 20 mg by mouth daily., Disp: , Rfl:    tadalafil  (CIALIS ) 5 MG tablet, TAKE 1 TABLET BY MOUTH DAILY AS NEEDED FOR ERECTILE DYSFUNCTION, Disp: 90 tablet, Rfl: 3   tamsulosin  (FLOMAX ) 0.4 MG CAPS capsule, Take 1 capsule by mouth once daily, Disp: 90 capsule, Rfl: 0   XARELTO  20 MG TABS tablet, TAKE 1 TABLET BY MOUTH ONCE DAILY WITH SUPPER, Disp: 90 tablet,  Rfl: 2   zolpidem  (AMBIEN ) 5 MG tablet, Take 1 tablet (5 mg total) by mouth at bedtime., Disp: 90 tablet, Rfl: 1   Objective:     There were no vitals filed for this visit.    There is no height or weight on file to calculate BMI.    Physical Exam:    ***   Electronically signed by:  Marshall Skeeter [facility] 7:34 AM 05/17/24

## 2024-05-18 ENCOUNTER — Ambulatory Visit: Admitting: Sports Medicine

## 2024-05-18 VITALS — BP 140/70 | HR 60 | Ht 68.0 in | Wt 176.4 lb

## 2024-05-18 DIAGNOSIS — M65342 Trigger finger, left ring finger: Secondary | ICD-10-CM | POA: Diagnosis not present

## 2024-05-18 DIAGNOSIS — M51362 Other intervertebral disc degeneration, lumbar region with discogenic back pain and lower extremity pain: Secondary | ICD-10-CM | POA: Diagnosis not present

## 2024-05-18 DIAGNOSIS — G8929 Other chronic pain: Secondary | ICD-10-CM

## 2024-05-18 DIAGNOSIS — M65332 Trigger finger, left middle finger: Secondary | ICD-10-CM | POA: Diagnosis not present

## 2024-05-18 NOTE — Patient Instructions (Addendum)
 Injected left hand today. Referral to physical therapy at [facility]. Follow up as needed.

## 2024-05-22 DIAGNOSIS — H52223 Regular astigmatism, bilateral: Secondary | ICD-10-CM | POA: Diagnosis not present

## 2024-05-22 DIAGNOSIS — Z961 Presence of intraocular lens: Secondary | ICD-10-CM | POA: Diagnosis not present

## 2024-05-22 DIAGNOSIS — H47013 Ischemic optic neuropathy, bilateral: Secondary | ICD-10-CM | POA: Diagnosis not present

## 2024-05-22 DIAGNOSIS — H524 Presbyopia: Secondary | ICD-10-CM | POA: Diagnosis not present

## 2024-05-22 DIAGNOSIS — H35373 Puckering of macula, bilateral: Secondary | ICD-10-CM | POA: Diagnosis not present

## 2024-05-22 DIAGNOSIS — H5203 Hypermetropia, bilateral: Secondary | ICD-10-CM | POA: Diagnosis not present

## 2024-05-25 ENCOUNTER — Encounter: Payer: Self-pay | Admitting: Sports Medicine

## 2024-05-26 ENCOUNTER — Ambulatory Visit: Attending: Sports Medicine | Admitting: Rehabilitative and Restorative Service Providers"

## 2024-05-26 ENCOUNTER — Encounter: Payer: Self-pay | Admitting: Rehabilitative and Restorative Service Providers"

## 2024-05-26 ENCOUNTER — Other Ambulatory Visit: Payer: Self-pay

## 2024-05-26 DIAGNOSIS — M5459 Other low back pain: Secondary | ICD-10-CM | POA: Diagnosis not present

## 2024-05-26 DIAGNOSIS — M5441 Lumbago with sciatica, right side: Secondary | ICD-10-CM | POA: Diagnosis not present

## 2024-05-26 DIAGNOSIS — R2689 Other abnormalities of gait and mobility: Secondary | ICD-10-CM | POA: Diagnosis not present

## 2024-05-26 DIAGNOSIS — R262 Difficulty in walking, not elsewhere classified: Secondary | ICD-10-CM | POA: Diagnosis not present

## 2024-05-26 DIAGNOSIS — M51362 Other intervertebral disc degeneration, lumbar region with discogenic back pain and lower extremity pain: Secondary | ICD-10-CM | POA: Insufficient documentation

## 2024-05-26 DIAGNOSIS — M6281 Muscle weakness (generalized): Secondary | ICD-10-CM | POA: Insufficient documentation

## 2024-05-26 DIAGNOSIS — G8929 Other chronic pain: Secondary | ICD-10-CM | POA: Insufficient documentation

## 2024-05-26 DIAGNOSIS — M5442 Lumbago with sciatica, left side: Secondary | ICD-10-CM | POA: Diagnosis not present

## 2024-05-26 NOTE — Therapy (Signed)
 OUTPATIENT PHYSICAL THERAPY THORACOLUMBAR EVALUATION   Patient Name: [name] MRN: [id number] DOB:[birth year], 78 y.o., male Today's Date: 05/26/2024  END OF SESSION:  PT End of Session - 05/26/24 0811     Visit Number 1    Date for PT Re-Evaluation 07/21/24    Authorization Type [employer]    PT Start Time 0800    PT Stop Time 0840    PT Time Calculation (min) 40 min    Activity Tolerance Patient tolerated treatment well    Behavior During Therapy [facility] for tasks assessed/performed          Past Medical History:  Diagnosis Date   Abscess    right posterior neck   Anticoagulation adequate 06/07/2019   Overview:  SSS, paroxAFib/Flutter, CHADSvasc= 3 age, h/o cva; chronic OAC Xarelto    Atrial fibrillation and flutter (HCC) 01/01/2015   Noted 2016. Xarelto     BPH associated with nocturia 08/14/2014   Finasteride , flomax .  0-3x nocturia. PSA to 14-->eval urology [facility]. PSA trended back down to about 2.6 per records in 2015 then placed on finasteride - had significant voiding issues when spiked to 14- improved on meds    Bradycardia    CAD (coronary artery disease) 05/26/2016   Coronary CTA_ 50-75% D1 off CT report    Cardiac pacemaker in situ 12/28/2009   SA node dysfunction as cause    Chest pressure 12/01/2015   Overview:  atypical CP, [facility] nuclear perfusion imaging w/ no inarct, no ischemia, EF-40% NICM   Chronic systolic heart failure (HCC) 12/01/2015   Overview:  [facility] nuclear perfusion imaging study, no inarct, no ischemia, EF-40% NICM   CVA (cerebral vascular accident) (HCC)    Double vision 07/04/2018   Elevated LFTs    Erectile dysfunction 01/30/2015   cialis  prn    External hemorrhoids    Fitting or adjustment of cardiac pacemaker 06/07/2019   Overview:  Biotronik [place] DR [id number], RA and RV leads [id number]:  [id number] and [id number]   Former smoker 01/30/2015   Former smoker. 20 pack years quit 92. Patient declined AAA screen.     GERD (gastroesophageal reflux disease)     Gout 11/11/2009   Qualifier: Diagnosis of  By: Gardenia Pao     History of nuclear stress test 06/07/2019   Overview:  [facility], no inarct, no ischemia, EF-40% NICM   HTN (hypertension)    Hyperglycemia 06/10/2017   110s CBG 2018   Hyperlipidemia 07/12/2007   Crestor  20mg  daily with some myalgias; fish oil. Trig 200-500 despite this. LDL ok.        Insomnia 01/30/2015   ambien  5mg  prn. May refill.     Ischemic optic neuropathy    Left chest pressure 03/19/2015   Other and unspecified hyperlipidemia    Sinoatrial node dysfunction (HCC)    Spinal stenosis    Trigger finger, right middle finger 03/01/2018   Past Surgical History:  Procedure Laterality Date   ABSCESS DRAINAGE     righ tposterior neck   CARDIAC CATHETERIZATION  1996   CHOLECYSTECTOMY  02/07/2003   COLONOSCOPY  06/23/2004   EYE SURGERY     left eye   left ingunial hernia  06/08/2011   PACEMAKER INSERTION     right hip replacement     2012 Dr. Hiram   Patient Active Problem List   Diagnosis Date Noted   Aortic atherosclerosis (HCC) 08/01/2020   Fatty liver 08/01/2020   Paroxysmal atrial fibrillation (HCC) 07/27/2019   Anticoagulation adequate 06/07/2019  Convergence insufficiency 06/07/2019   Fitting or adjustment of cardiac pacemaker 06/07/2019   Pacemaker-dependent due to native cardiac rhythm insufficient to support life 06/07/2019   Palpitations 06/07/2019   History of nuclear stress test 06/07/2019   S/P cardiac cath 06/07/2019   Non-occlusive coronary artery disease 06/07/2019   Double vision 07/04/2018   Trigger finger, right middle finger 03/01/2018   Hyperglycemia 06/10/2017   Solitary pulmonary nodule 05/26/2016   CAD (coronary artery disease) 05/26/2016   Nonischemic cardiomyopathy (HCC) 04/23/2016   Chronic systolic heart failure (HCC) 12/01/2015   Chest pressure 12/01/2015   Left chest pressure 03/19/2015   Insomnia 01/30/2015   Erectile dysfunction 01/30/2015   Cerebral infarction (HCC)  01/30/2015   Former smoker 01/30/2015   Atrial fibrillation and flutter (HCC) 01/01/2015   Allergic rhinitis 10/11/2014   BPH associated with nocturia 08/14/2014   Ventricular tachycardia, non-sustained (HCC) 01/25/2013   Sinoatrial node dysfunction (HCC)    SPINAL STENOSIS, LUMBAR 08/22/2010   GERD 03/17/2010   Cardiac pacemaker in situ 12/28/2009   Gout 11/11/2009   Hyperlipidemia 07/12/2007   NEUROPATHY, ISCHEMIC OPTIC 06/02/2007   Essential hypertension 06/02/2007    PCP: [name]., MD  REFERRING PROVIDER: [name], DO  REFERRING DIAG: M54.42,M54.41,G89.29 (ICD-10-CM) - Chronic bilateral low back pain with bilateral sciatica M51.362 (ICD-10-CM) - Degeneration of intervertebral disc of lumbar region with discogenic back pain and lower extremity pain  Rationale for Evaluation and Treatment: Rehabilitation  THERAPY DIAG:  Other low back pain  Other abnormalities of gait and mobility  Difficulty in walking, not elsewhere classified  Muscle weakness (generalized)  ONSET DATE: February 2024 first visit to Dr Leonce  SUBJECTIVE:                                                                                                                                                                                           SUBJECTIVE STATEMENT: Patient states that he has been having back pain for or over a year.  States that he has had numerous injections by Dr Leonce.  States that he enjoys traveling, with a recent trip to [place].  Patient does report that his pain is feeling somewhat better now, but he is wanting to come to PT now to strengthen his muscles and hopefully decrease his need to take medications.  Patient had sciatica in the past, but that is no longer occurring.  Patient loves to travel and has many upcoming trips planned (next trip in a couple of weeks 7/14 on a [place] cruise).  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  PERTINENT  HISTORY:  [name] (next  upcoming trip 7/14 for a [place] cruise) OA, trigger fingers, right hip arthroplasty, pacemaker  PAIN:  Are you having pain? Yes: NPRS scale: 1-2/10 Pain location: low back Pain description: discomfort Aggravating factors: worst first thing in the morning or after lying on his couch, swimming Relieving factors: medication, movement  PRECAUTIONS: ICD/Pacemaker  RED FLAGS: None   WEIGHT BEARING RESTRICTIONS: No  FALLS:  Has patient fallen in last 6 months? Yes. Number of falls 1 fall.  Tripped over a chair while traveling at [place]  LIVING ENVIRONMENT: Lives with: lives with their spouse Lives in: House/apartment Stairs: one story Has following equipment at home: None  OCCUPATION: Retired  PLOF: Independent and Leisure: traveling (many upcoming trips planned), swimming  PATIENT GOALS: To feel better and not have to be reliant on pain medication as much and improve balance and flexibility.  NEXT MD VISIT: Follow up with Dr Leonce as needed  OBJECTIVE:  Note: Objective measures were completed at Evaluation unless otherwise noted.  DIAGNOSTIC FINDINGS:  Lumbar CT Scan on 02/04/2023: IMPRESSION: Multilevel degenerative changes which are most severe at the L3-4 level with significant spinal stenosis. No acute traumatic abnormalities identified.  PATIENT SURVEYS:  Modified Oswestry:  MODIFIED OSWESTRY DISABILITY SCALE   Date: 05/26/2024 Score  Pain intensity 2 =  Pain medication provides me with complete relief from pain.  2. Personal care (washing, dressing, etc.) 0 =  I can take care of myself normally without causing increased pain.  3. Lifting 2 = Pain prevents me from lifting heavy weights off the floor, activities (eg. sports, dancing). but I can manage if the weights are conveniently positioned (3) Pain prevents me from going out very often. (eg, on a table).  4. Walking 0 = Pain does not prevent me from walking any distance  5.  Sitting 0 =  I can sit in any chair as long as I like.  6. Standing 1 =  I can stand as long as I want but, it increases my pain.  7. Sleeping 1 = I can sleep well only by using pain medication.  8. Social Life 0 = My social life is normal and does not increase my pain.  9. Traveling 2 =  My pain restricts my travel over 2 hours.  10. Employment/ Homemaking 1 = My normal homemaking/job activities increase my pain, but I can still perform all that is required of me  Total 9 / 50 = 18.0 %   Interpretation of scores: Score Category Description  0-20% Minimal Disability The patient can cope with most living activities. Usually no treatment is indicated apart from advice on lifting, sitting and exercise  21-40% Moderate Disability The patient experiences more pain and difficulty with sitting, lifting and standing. Travel and social life are more difficult and they may be disabled from work. Personal care, sexual activity and sleeping are not grossly affected, and the patient can usually be managed by conservative means  41-60% Severe Disability Pain remains the main problem in this group, but activities of daily living are affected. These patients require a detailed investigation  61-80% Crippled Back pain impinges on all aspects of the patient's life. Positive intervention is required  81-100% Bed-bound  These patients are either bed-bound or exaggerating their symptoms  Bluford FORBES Zoe DELENA Karon DELENA, et al. Surgery versus conservative management of stable thoracolumbar fracture: the PRESTO feasibility RCT. [place] ([place]): [employer]; 2021 Nov. [facility] Technology Assessment, No. 25.62.) Appendix 3, Oswestry Disability Index category descriptors.  Available from: FindJewel[employer].cz  Minimally Clinically Important Difference (MCID) = 12.8%  COGNITION: Overall cognitive status: Within functional limits for tasks assessed     SENSATION: Patient states that he  has a neuropathy problem in his feet, but it has improved since starting Gabapentin   MUSCLE LENGTH: Hamstrings: increased tightness bilaterally Piriformis:  tightness bilat  POSTURE: rounded shoulders and forward head   LUMBAR ROM:   Eval:  Decreased by approx 50%  LOWER EXTREMITY ROM:     WFL  LOWER EXTREMITY MMT:    Eval:   LE grossly WNL, but grossly 4+ to 5-/5 in right hip Core strength grossly 4-/5 throughout  LUMBAR SPECIAL TESTS:  Slump test: Negative  FUNCTIONAL TESTS:  5 times sit to stand: 11.87 sec Single Leg Stance:  right- 1.53 sec, left- 1.40 sec  GAIT: Distance walked: >500 ft Assistive device utilized: None Level of assistance: Complete Independence Comments: States that he has more difficulty with balance on unlevel surfaces (like grass) than firm surfaces.  TREATMENT DATE:  05/26/2024 Seated hamstring stretch x20 sec bilat Seated piriformis stretch x20 sec bilat Reviewed HEP and role of PT                                                                                                                      PATIENT EDUCATION:  Education details: Issued HEP Person educated: Patient Education method: Explanation, Demonstration, and Handouts Education comprehension: verbalized understanding and returned demonstration  HOME EXERCISE PROGRAM: Access Code: [facility] URL: [email or web address] Date: 05/26/2024 Prepared by: Jarrell Keyonte [place]  Exercises - Seated Transversus Abdominis Bracing  - 1 x daily - 7 x weekly - 2 sets - 10 reps - Seated Hamstring Stretch  - 1 x daily - 7 x weekly - 2 reps - 20 sec hold - Seated Piriformis Stretch  - 1 x daily - 7 x weekly - 2 reps - 20 sec hold - Single Leg Stance with Support  - 1 x daily - 7 x weekly - 2 reps - 20 sec hold  ASSESSMENT:  CLINICAL IMPRESSION: Patient is a 78 y.o. male who was seen today for physical therapy evaluation and treatment for low back pain and lumbar DDD.  Patient  states that with various medication and injections, his back pain is now more of a discomfort than pain and at a manageable level, so now, he is wanting to participate in PT to be able to strengthen his muscles and improve his flexibility and balance to allow him to maintain his decreased pain.  Patient travels frequently and states that he has the most instability with walking on unlevel surfaces, like grassy surfaces.  He presents with muscle weakness, difficulty walking, decreased balance, decreased flexibility, muscle spasms.  Patient would benefit from skilled PT to progress towards goal related activities to allow him to improve his functional impairments.  OBJECTIVE IMPAIRMENTS: decreased balance, decreased strength, increased muscle spasms, impaired flexibility, postural dysfunction, and pain.   ACTIVITY LIMITATIONS: lifting, standing, and stairs  PARTICIPATION LIMITATIONS:  driving, community activity, and yard work  PERSONAL FACTORS: Time since onset of injury/illness/exacerbation and 1-2 comorbidities: OA, right hip arthroplasty are also affecting patient's functional outcome.   REHAB POTENTIAL: Good  CLINICAL DECISION MAKING: Stable/uncomplicated  EVALUATION COMPLEXITY: Low   GOALS: Goals reviewed with patient? Yes  SHORT TERM GOALS: Target date: 06/16/2024  Patient will be independent with initial HEP. Baseline: Goal status: INITIAL  2.  Patient will demonstrate and verbalize knowledge of proper lifting techniques to allow him to lift suitcases for travel. Baseline:  Goal status: INITIAL   LONG TERM GOALS: Target date: 07/21/2024  Patient will be independent with advanced HEP to allow for self progression after discharge. Baseline:  Goal status: INITIAL  2.  Patient will improve modified Oswestry to not greater than 5% to demonstrate improved functional mobility. Baseline: 18% Goal status: INITIAL  3.  Patient will increase right hip strength to [facility] to allow him to  navigate stairs with reciprocal pattern without difficulty/discomfort. Baseline:  Goal status: INITIAL  4.  Patient will increase bilateral single leg stance to greater than 10 seconds to decrease patients fall risk. Baseline: right- 1.53 sec, left- 1.40 sec Goal status: INITIAL  5.  Patient will report ability to ambulate over various unlevel surfaces for desired length (at least greater than 30 minutes) without a loss of balance to allow patient to travel and sight-see. Baseline:  Goal status: INITIAL   PLAN:  PT FREQUENCY: 1-2x/week  PT DURATION: 8 weeks  PLANNED INTERVENTIONS: [id number]- PT Re-evaluation, 97750- Physical Performance Testing, 97110-Therapeutic exercises, 97530- Therapeutic activity, [id number]- Neuromuscular re-education, 97535- Self Care, [id number]- Manual therapy, [id number]- Gait training, [phone]- Canalith repositioning, [id number]- Aquatic Therapy, [phone]- Electrical stimulation (unattended), [phone]- Electrical stimulation (manual), [id number]- Ultrasound, [id number]- Traction (mechanical), [id number]- Ionotophoresis 4mg /ml Dexamethasone , 79439 (1-2 muscles), [id number] (3+ muscles)- Dry Needling, Patient/Family education, Balance training, Stair training, Taping, Joint mobilization, Joint manipulation, Spinal manipulation, Spinal mobilization, Cryotherapy, and Moist heat.  PLAN FOR NEXT SESSION: Assess and progress HEP as indicated, strengthening, flexibility, manual/dry needling as indicated    Jarrell Laming, PT, DPT 05/26/24, 8:13 AM  [facility] [address] [place], [place] [address] Phone # [phone] Fax [phone]

## 2024-05-29 ENCOUNTER — Ambulatory Visit: Payer: Self-pay | Admitting: Rehabilitative and Restorative Service Providers"

## 2024-05-29 ENCOUNTER — Encounter: Payer: Self-pay | Admitting: Rehabilitative and Restorative Service Providers"

## 2024-05-29 DIAGNOSIS — M5441 Lumbago with sciatica, right side: Secondary | ICD-10-CM | POA: Diagnosis not present

## 2024-05-29 DIAGNOSIS — M51362 Other intervertebral disc degeneration, lumbar region with discogenic back pain and lower extremity pain: Secondary | ICD-10-CM | POA: Diagnosis not present

## 2024-05-29 DIAGNOSIS — G8929 Other chronic pain: Secondary | ICD-10-CM | POA: Diagnosis not present

## 2024-05-29 DIAGNOSIS — M6281 Muscle weakness (generalized): Secondary | ICD-10-CM | POA: Diagnosis not present

## 2024-05-29 DIAGNOSIS — M5459 Other low back pain: Secondary | ICD-10-CM | POA: Diagnosis not present

## 2024-05-29 DIAGNOSIS — R2689 Other abnormalities of gait and mobility: Secondary | ICD-10-CM

## 2024-05-29 DIAGNOSIS — R262 Difficulty in walking, not elsewhere classified: Secondary | ICD-10-CM | POA: Diagnosis not present

## 2024-05-29 DIAGNOSIS — M5442 Lumbago with sciatica, left side: Secondary | ICD-10-CM | POA: Diagnosis not present

## 2024-05-29 NOTE — Therapy (Addendum)
 OUTPATIENT PHYSICAL THERAPY TREATMENT NOTE   Patient Name: [name] MRN: [id number] DOB:[birth year], 78 y.o., male Today's Date: 05/29/2024  END OF SESSION:  PT End of Session - 05/29/24 0851     Visit Number 2    Date for PT Re-Evaluation 07/21/24    Authorization Type [employer] 05/26/2024-07/21/2024-auth#[id number]    Authorization - Visit Number 2    Authorization - Number of Visits 16    Progress Note Due on Visit 10    PT Start Time 0849    PT Stop Time 0930    PT Time Calculation (min) 41 min    Activity Tolerance Patient tolerated treatment well    Behavior During Therapy [facility] for tasks assessed/performed          Past Medical History:  Diagnosis Date   Abscess    right posterior neck   Anticoagulation adequate 06/07/2019   Overview:  SSS, paroxAFib/Flutter, CHADSvasc= 3 age, h/o cva; chronic OAC Xarelto    Atrial fibrillation and flutter (HCC) 01/01/2015   Noted 2016. Xarelto     BPH associated with nocturia 08/14/2014   Finasteride , flomax .  0-3x nocturia. PSA to 14-->eval urology [facility] PSA trended back down to about 2.6 per records in 2015 then placed on finasteride - had significant voiding issues when spiked to 14- improved on meds    Bradycardia    CAD (coronary artery disease) 05/26/2016   Coronary CTA_ 50-75% D1 off CT report    Cardiac pacemaker in situ 12/28/2009   SA node dysfunction as cause    Chest pressure 12/01/2015   Overview:  atypical CP, [facility] nuclear perfusion imaging w/ no inarct, no ischemia, EF-40% NICM   Chronic systolic heart failure (HCC) 12/01/2015   Overview:  [facility] nuclear perfusion imaging study, no inarct, no ischemia, EF-40% NICM   CVA (cerebral vascular accident) (HCC)    Double vision 07/04/2018   Elevated LFTs    Erectile dysfunction 01/30/2015   cialis  prn    External hemorrhoids    Fitting or adjustment of cardiac pacemaker 06/07/2019   Overview:  Biotronik Roland DR [id number], RA and RV leads [id number]:  [id number] and [id number]   Former  smoker 01/30/2015   Former smoker. 20 pack years quit 92. Patient declined AAA screen.     GERD (gastroesophageal reflux disease)    Gout 11/11/2009   Qualifier: Diagnosis of  By: Gardenia Pao     History of nuclear stress test 06/07/2019   Overview:  [facility], no inarct, no ischemia, EF-40% NICM   HTN (hypertension)    Hyperglycemia 06/10/2017   110s CBG 2018   Hyperlipidemia 07/12/2007   Crestor  20mg  daily with some myalgias; fish oil. Trig 200-500 despite this. LDL ok.        Insomnia 01/30/2015   ambien  5mg  prn. May refill.     Ischemic optic neuropathy    Left chest pressure 03/19/2015   Other and unspecified hyperlipidemia    Sinoatrial node dysfunction (HCC)    Spinal stenosis    Trigger finger, right middle finger 03/01/2018   Past Surgical History:  Procedure Laterality Date   ABSCESS DRAINAGE     righ tposterior neck   CARDIAC CATHETERIZATION  1996   CHOLECYSTECTOMY  02/07/2003   COLONOSCOPY  06/23/2004   EYE SURGERY     left eye   left ingunial hernia  06/08/2011   PACEMAKER INSERTION     right hip replacement     2012 Dr. Hiram   Patient Active Problem List   Diagnosis  Date Noted   Aortic atherosclerosis (HCC) 08/01/2020   Fatty liver 08/01/2020   Paroxysmal atrial fibrillation (HCC) 07/27/2019   Anticoagulation adequate 06/07/2019   Convergence insufficiency 06/07/2019   Fitting or adjustment of cardiac pacemaker 06/07/2019   Pacemaker-dependent due to native cardiac rhythm insufficient to support life 06/07/2019   Palpitations 06/07/2019   History of nuclear stress test 06/07/2019   S/P cardiac cath 06/07/2019   Non-occlusive coronary artery disease 06/07/2019   Double vision 07/04/2018   Trigger finger, right middle finger 03/01/2018   Hyperglycemia 06/10/2017   Solitary pulmonary nodule 05/26/2016   CAD (coronary artery disease) 05/26/2016   Nonischemic cardiomyopathy (HCC) 04/23/2016   Chronic systolic heart failure (HCC) 12/01/2015   Chest pressure  12/01/2015   Left chest pressure 03/19/2015   Insomnia 01/30/2015   Erectile dysfunction 01/30/2015   Cerebral infarction (HCC) 01/30/2015   Former smoker 01/30/2015   Atrial fibrillation and flutter (HCC) 01/01/2015   Allergic rhinitis 10/11/2014   BPH associated with nocturia 08/14/2014   Ventricular tachycardia, non-sustained (HCC) 01/25/2013   Sinoatrial node dysfunction (HCC)    SPINAL STENOSIS, LUMBAR 08/22/2010   GERD 03/17/2010   Cardiac pacemaker in situ 12/28/2009   Gout 11/11/2009   Hyperlipidemia 07/12/2007   NEUROPATHY, ISCHEMIC OPTIC 06/02/2007   Essential hypertension 06/02/2007    PCP: [name]., MD  REFERRING PROVIDER: [name], DO  REFERRING DIAG: M54.42,M54.41,G89.29 (ICD-10-CM) - Chronic bilateral low back pain with bilateral sciatica M51.362 (ICD-10-CM) - Degeneration of intervertebral disc of lumbar region with discogenic back pain and lower extremity pain  Rationale for Evaluation and Treatment: Rehabilitation  THERAPY DIAG:  Other low back pain  Other abnormalities of gait and mobility  Difficulty in walking, not elsewhere classified  Muscle weakness (generalized)  ONSET DATE: February 2024 first visit to Dr Leonce  SUBJECTIVE:                                                                                                                                                                                           SUBJECTIVE STATEMENT: Patient states that he has been doing his exercises.  PERTINENT HISTORY:  [name] (next upcoming trip 7/14 for a [place] cruise) OA, trigger fingers, right hip arthroplasty, pacemaker  PAIN:  Are you having pain? Yes: NPRS scale: 0-1/10 Pain location: low back Pain description: discomfort Aggravating factors: worst first thing in the morning or after lying on his couch, swimming Relieving factors: medication, movement  PRECAUTIONS: ICD/Pacemaker  RED FLAGS: None   WEIGHT  BEARING RESTRICTIONS: No  FALLS:  Has patient fallen in last 6 months? Yes. Number of falls 1 fall.  Tripped over a chair while traveling at [place]  LIVING ENVIRONMENT: Lives with: lives with their spouse Lives in: House/apartment Stairs: one story Has following equipment at home: None  OCCUPATION: Retired  PLOF: Independent and Leisure: traveling (many upcoming trips planned), swimming  PATIENT GOALS: To feel better and not have to be reliant on pain medication as much and improve balance and flexibility.  NEXT MD VISIT: Follow up with Dr Leonce as needed  OBJECTIVE:  Note: Objective measures were completed at Evaluation unless otherwise noted.  DIAGNOSTIC FINDINGS:  Lumbar CT Scan on 02/04/2023: IMPRESSION: Multilevel degenerative changes which are most severe at the L3-4 level with significant spinal stenosis. No acute traumatic abnormalities identified.  PATIENT SURVEYS:  Modified Oswestry:  MODIFIED OSWESTRY DISABILITY SCALE   Date: 05/26/2024 Score  Pain intensity 2 =  Pain medication provides me with complete relief from pain.  2. Personal care (washing, dressing, etc.) 0 =  I can take care of myself normally without causing increased pain.  3. Lifting 2 = Pain prevents me from lifting heavy weights off the floor, activities (eg. sports, dancing). but I can manage if the weights are conveniently positioned (3) Pain prevents me from going out very often. (eg, on a table).  4. Walking 0 = Pain does not prevent me from walking any distance  5. Sitting 0 =  I can sit in any chair as long as I like.  6. Standing 1 =  I can stand as long as I want but, it increases my pain.  7. Sleeping 1 = I can sleep well only by using pain medication.  8. Social Life 0 = My social life is normal and does not increase my pain.  9. Traveling 2 =  My pain restricts my travel over 2 hours.  10. Employment/ Homemaking 1 = My normal homemaking/job activities increase my pain, but I can still  perform all that is required of me  Total 9 / 50 = 18.0 %   Interpretation of scores: Score Category Description  0-20% Minimal Disability The patient can cope with most living activities. Usually no treatment is indicated apart from advice on lifting, sitting and exercise  21-40% Moderate Disability The patient experiences more pain and difficulty with sitting, lifting and standing. Travel and social life are more difficult and they may be disabled from work. Personal care, sexual activity and sleeping are not grossly affected, and the patient can usually be managed by conservative means  41-60% Severe Disability Pain remains the main problem in this group, but activities of daily living are affected. These patients require a detailed investigation  61-80% Crippled Back pain impinges on all aspects of the patient's life. Positive intervention is required  81-100% Bed-bound  These patients are either bed-bound or exaggerating their symptoms  Bluford FORBES Zoe DELENA Karon DELENA, et al. Surgery versus conservative management of stable thoracolumbar fracture: the PRESTO feasibility RCT. [place]): [employer]; 2021 Nov. [facility] Psychiatric Technology Assessment, No. 25.62.) Appendix 3, Oswestry Disability Index category descriptors. Available from: FindJewelers.cz  Minimally Clinically Important Difference (MCID) = 12.8%  COGNITION: Overall cognitive status: Within functional limits for tasks assessed     SENSATION: Patient states that he has a neuropathy problem in his feet, but it has improved since starting Gabapentin   MUSCLE LENGTH: Hamstrings: increased tightness bilaterally Piriformis:  tightness bilat  POSTURE: rounded shoulders and forward head   LUMBAR ROM:   Eval:  Decreased by approx 50%  LOWER EXTREMITY ROM:  WFL  LOWER EXTREMITY MMT:    Eval:   LE grossly WNL, but grossly 4+ to 5-/5 in right hip Core strength grossly 4-/5  throughout  LUMBAR SPECIAL TESTS:  Slump test: Negative  FUNCTIONAL TESTS:  Eval: 5 times sit to stand: 11.87 sec Single Leg Stance:  right- 1.53 sec, left- 1.40 sec  GAIT: Distance walked: >500 ft Assistive device utilized: None Level of assistance: Complete Independence Comments: States that he has more difficulty with balance on unlevel surfaces (like grass) than firm surfaces.  TREATMENT DATE:  05/29/2024 Nustep level 3 x6 min with PT present to discuss status Seated hamstring stretch 2x20 sec bilat Seated piriformis stretch 2x20 sec bilat Seated hip adduction ball squeeze 2x10 Seated hip abduction with yellow loop 2x10 Seated blue pball rollout x10 Tandem walking at barre with UE support as needed down and back x2 laps Standing rocker board for DF/PF x2 min Standing balance on rocker board in lateral position with finger touch x1 min   05/26/2024 Seated hamstring stretch x20 sec bilat Seated piriformis stretch x20 sec bilat Reviewed HEP and role of PT                                                                                                                      PATIENT EDUCATION:  Education details: Issued HEP Person educated: Patient Education method: Explanation, Demonstration, and Handouts Education comprehension: verbalized understanding and returned demonstration  HOME EXERCISE PROGRAM: Access Code: [facility] URL: [email or web address] Date: 05/26/2024 Prepared by: Jarrell Jaileen Janelle  Exercises - Seated Transversus Abdominis Bracing  - 1 x daily - 7 x weekly - 2 sets - 10 reps - Seated Hamstring Stretch  - 1 x daily - 7 x weekly - 2 reps - 20 sec hold - Seated Piriformis Stretch  - 1 x daily - 7 x weekly - 2 reps - 20 sec hold - Single Leg Stance with Support  - 1 x daily - 7 x weekly - 2 reps - 20 sec hold  ASSESSMENT:  CLINICAL IMPRESSION: Mr Maberry presents to skilled PT for initial visit following evaluation.  He states that he has  been doing his exercises.  Patient only requires minimal cuing throughout for improved technique and pacing during exercises.  Patient able to progress with increased flexibility and core strengthening.  Patient continues to require skilled PT to progress towards goal related activities.  OBJECTIVE IMPAIRMENTS: decreased balance, decreased strength, increased muscle spasms, impaired flexibility, postural dysfunction, and pain.   ACTIVITY LIMITATIONS: lifting, standing, and stairs  PARTICIPATION LIMITATIONS: driving, community activity, and yard work  PERSONAL FACTORS: Time since onset of injury/illness/exacerbation and 1-2 comorbidities: OA, right hip arthroplasty are also affecting patient's functional outcome.   REHAB POTENTIAL: Good  CLINICAL DECISION MAKING: Stable/uncomplicated  EVALUATION COMPLEXITY: Low   GOALS: Goals reviewed with patient? Yes  SHORT TERM GOALS: Target date: 06/16/2024  Patient will be independent with initial HEP. Baseline: Goal status: Ongoing  2.  Patient will demonstrate and verbalize knowledge of  proper lifting techniques to allow him to lift suitcases for travel. Baseline:  Goal status: INITIAL   LONG TERM GOALS: Target date: 07/21/2024  Patient will be independent with advanced HEP to allow for self progression after discharge. Baseline:  Goal status: INITIAL  2.  Patient will improve modified Oswestry to not greater than 5% to demonstrate improved functional mobility. Baseline: 18% Goal status: INITIAL  3.  Patient will increase right hip strength to [facility] to allow him to navigate stairs with reciprocal pattern without difficulty/discomfort. Baseline:  Goal status: INITIAL  4.  Patient will increase bilateral single leg stance to greater than 10 seconds to decrease patients fall risk. Baseline: right- 1.53 sec, left- 1.40 sec Goal status: INITIAL  5.  Patient will report ability to ambulate over various unlevel surfaces for desired length  (at least greater than 30 minutes) without a loss of balance to allow patient to travel and sight-see. Baseline:  Goal status: INITIAL   PLAN:  PT FREQUENCY: 1-2x/week  PT DURATION: 8 weeks  PLANNED INTERVENTIONS: [id number]- PT Re-evaluation, 97750- Physical Performance Testing, 97110-Therapeutic exercises, 97530- Therapeutic activity, [id number]- Neuromuscular re-education, 97535- Self Care, 02859- Manual therapy, [id number]- Gait training, [phone]- Canalith repositioning, [id number]- Aquatic Therapy, [phone]- Electrical stimulation (unattended), [phone]- Electrical stimulation (manual), [id number]- Ultrasound, [id number]- Traction (mechanical), [id number]- Ionotophoresis 4mg /ml Dexamethasone , 79439 (1-2 muscles), 20561 (3+ muscles)- Dry Needling, Patient/Family education, Balance training, Stair training, Taping, Joint mobilization, Joint manipulation, Spinal manipulation, Spinal mobilization, Cryotherapy, and Moist heat.  PLAN FOR NEXT SESSION: Assess and progress HEP as indicated, strengthening, flexibility, manual/dry needling as indicated    Jarrell Laming, PT, DPT 05/29/24, 9:34 AM  [facility] [address], [address] [place], [place] [address] Phone # [phone] Fax [phone]

## 2024-05-31 ENCOUNTER — Ambulatory Visit: Attending: Sports Medicine | Admitting: Rehabilitative and Restorative Service Providers"

## 2024-05-31 ENCOUNTER — Encounter: Payer: Self-pay | Admitting: Rehabilitative and Restorative Service Providers"

## 2024-05-31 DIAGNOSIS — M5459 Other low back pain: Secondary | ICD-10-CM | POA: Insufficient documentation

## 2024-05-31 DIAGNOSIS — R262 Difficulty in walking, not elsewhere classified: Secondary | ICD-10-CM | POA: Insufficient documentation

## 2024-05-31 DIAGNOSIS — M6281 Muscle weakness (generalized): Secondary | ICD-10-CM | POA: Insufficient documentation

## 2024-05-31 DIAGNOSIS — R2689 Other abnormalities of gait and mobility: Secondary | ICD-10-CM | POA: Diagnosis not present

## 2024-05-31 NOTE — Progress Notes (Unsigned)
 [name] [address] [place] [address] Phone: [phone]   Assessment and Plan:     There are no diagnoses linked to this encounter.  ***   Pertinent previous records reviewed include ***    Follow Up: ***     Subjective:   I, [name], am serving as a Neurosurgeon for Doctor Morene Mace  Chief Complaint: low back pain    HPI:    01/28/2023 Patient is a 78 year old male complaining of low back pain. Patient states chronic intermittent pain 2-3 months ago he started having new pain that started in his thigh up to his butt and down to his knees , pain when standing , bending over, pain when coughing and sneezing, pain is a pressure feeling , he was taking indomethacin  , has been taking tylenol  does not seems to help , no numbness or tingling ,    05/12/2023 Patient states that he is not well, only had 2 weeks of relief from 1st epidural , 2nd epidural pain has come back and it was all movements    10/13/2023 Patient states that his pain has morphed into sciatica now an electrical shooting pain from is butt to to his legs.    12/07/2023 Patient states he was doing poorly up until Sunday he felt better. This morning he woke up and is in a lot of pain from his waist to his buttock    05/18/2024 Patient states can't close his hand or grip things. Third and forth finger. As the day goes on the fingers seem to getting loose.   06/01/2024 Patient states   Relevant Historical Information: On chronic anticoagulation with Xarelto , hypertension, paroxysmal atrial fibrillation, CAD, CHF Additional pertinent review of systems negative.   Current Outpatient Medications:    benazepril  (LOTENSIN ) 5 MG tablet, Take 1 tablet by mouth once daily, Disp: 90 tablet, Rfl: 0   cyclobenzaprine  (FLEXERIL ) 5 MG tablet, Take 1 tablet (5 mg total) by mouth as needed. for muscle spams, Disp: 90 tablet, Rfl: 0   gabapentin  (NEURONTIN ) 400  MG capsule, Take 1 capsule by mouth twice daily, Disp: 180 capsule, Rfl: 0   gemfibrozil  (LOPID ) 600 MG tablet, TAKE 1 TABLET BY MOUTH TWICE DAILY BEFORE  A  MEAL, Disp: 180 tablet, Rfl: 3   icosapent Ethyl (VASCEPA) 1 g capsule, Take 1 g by mouth 2 (two) times daily., Disp: , Rfl:    indomethacin  (INDOCIN ) 25 MG capsule, Take 1 capsule (25 mg total) by mouth 2 (two) times daily with a meal., Disp: 60 capsule, Rfl: 3   metoprolol  succinate (TOPROL -XL) 25 MG 24 hr tablet, Take 1 tablet (25 mg total) by mouth daily., Disp: 90 tablet, Rfl: 3   metoprolol  tartrate (LOPRESSOR ) 25 MG tablet, Take 1 tablet (25 mg total) by mouth as needed. Take 1 tablet (25 mg total) as needed for heart rate greater than 120, Disp: 45 tablet, Rfl: 3   Multiple Vitamin (MULTIVITAMIN) tablet, Take 1 tablet by mouth daily., Disp: , Rfl:    omeprazole  (PRILOSEC) 20 MG capsule, Take 1 capsule by mouth once daily, Disp: 90 capsule, Rfl: 0   rosuvastatin  (CRESTOR ) 20 MG tablet, Take 20 mg by mouth daily., Disp: , Rfl:    tadalafil  (CIALIS ) 5 MG tablet, TAKE 1 TABLET BY MOUTH DAILY AS NEEDED FOR ERECTILE DYSFUNCTION, Disp: 90 tablet, Rfl: 3   tamsulosin  (FLOMAX ) 0.4 MG CAPS capsule, Take 1 capsule by mouth once daily,  Disp: 90 capsule, Rfl: 0   XARELTO  20 MG TABS tablet, TAKE 1 TABLET BY MOUTH ONCE DAILY WITH SUPPER, Disp: 90 tablet, Rfl: 2   zolpidem  (AMBIEN ) 5 MG tablet, Take 1 tablet (5 mg total) by mouth at bedtime., Disp: 90 tablet, Rfl: 1   Objective:     There were no vitals filed for this visit.    There is no height or weight on file to calculate BMI.    Physical Exam:    ***   Electronically signed by:  Odis Mace D.CLEMENTEEN AMYE [facility] 2:44 PM 05/31/24

## 2024-05-31 NOTE — Therapy (Signed)
 OUTPATIENT PHYSICAL THERAPY TREATMENT NOTE   Patient Name: [name] MRN: [id number] DOB:[birth year], 78 y.o., male Today's Date: 05/31/2024  END OF SESSION:  PT End of Session - 05/31/24 0740     Visit Number 3    Date for PT Re-Evaluation 07/21/24    Authorization Type [employer] 05/26/2024-07/21/2024-auth#[id number]    Authorization - Visit Number 3    Authorization - Number of Visits 16    Progress Note Due on Visit 10    PT Start Time 0731    PT Stop Time 0810    PT Time Calculation (min) 39 min    Activity Tolerance Patient tolerated treatment well    Behavior During Therapy [facility] for tasks assessed/performed          Past Medical History:  Diagnosis Date   Abscess    right posterior neck   Anticoagulation adequate 06/07/2019   Overview:  SSS, paroxAFib/Flutter, CHADSvasc= 3 age, h/o cva; chronic OAC Xarelto    Atrial fibrillation and flutter (HCC) 01/01/2015   Noted 2016. Xarelto     BPH associated with nocturia 08/14/2014   Finasteride , flomax .  0-3x nocturia. PSA to 14-->eval urology [facility] PSA trended back down to about 2.6 per records in 2015 then placed on finasteride - had significant voiding issues when spiked to 14- improved on meds    Bradycardia    CAD (coronary artery disease) 05/26/2016   Coronary CTA_ 50-75% D1 off CT report    Cardiac pacemaker in situ 12/28/2009   SA node dysfunction as cause    Chest pressure 12/01/2015   Overview:  atypical CP, [facility] nuclear perfusion imaging w/ no inarct, no ischemia, EF-40% NICM   Chronic systolic heart failure (HCC) 12/01/2015   Overview:  [facility] nuclear perfusion imaging study, no inarct, no ischemia, EF-40% NICM   CVA (cerebral vascular accident) (HCC)    Double vision 07/04/2018   Elevated LFTs    Erectile dysfunction 01/30/2015   cialis  prn    External hemorrhoids    Fitting or adjustment of cardiac pacemaker 06/07/2019   Overview:  [employer] [place] DR [id number], RA and RV leads [id number]:  [id number] and [id number]   Former  smoker 01/30/2015   Former smoker. 20 pack years quit 92. Patient declined AAA screen.     GERD (gastroesophageal reflux disease)    Gout 11/11/2009   Qualifier: Diagnosis of  By: Gardenia Pao     History of nuclear stress test 06/07/2019   Overview:  [facility], no inarct, no ischemia, EF-40% NICM   HTN (hypertension)    Hyperglycemia 06/10/2017   110s CBG 2018   Hyperlipidemia 07/12/2007   Crestor  20mg  daily with some myalgias; fish oil. Trig 200-500 despite this. LDL ok.        Insomnia 01/30/2015   ambien  5mg  prn. May refill.     Ischemic optic neuropathy    Left chest pressure 03/19/2015   Other and unspecified hyperlipidemia    Sinoatrial node dysfunction (HCC)    Spinal stenosis    Trigger finger, right middle finger 03/01/2018   Past Surgical History:  Procedure Laterality Date   ABSCESS DRAINAGE     righ tposterior neck   CARDIAC CATHETERIZATION  1996   CHOLECYSTECTOMY  02/07/2003   COLONOSCOPY  06/23/2004   EYE SURGERY     left eye   left ingunial hernia  06/08/2011   PACEMAKER INSERTION     right hip replacement     2012 Dr. Hiram   Patient Active Problem List   Diagnosis  Date Noted   Aortic atherosclerosis (HCC) 08/01/2020   Fatty liver 08/01/2020   Paroxysmal atrial fibrillation (HCC) 07/27/2019   Anticoagulation adequate 06/07/2019   Convergence insufficiency 06/07/2019   Fitting or adjustment of cardiac pacemaker 06/07/2019   Pacemaker-dependent due to native cardiac rhythm insufficient to support life 06/07/2019   Palpitations 06/07/2019   History of nuclear stress test 06/07/2019   S/P cardiac cath 06/07/2019   Non-occlusive coronary artery disease 06/07/2019   Double vision 07/04/2018   Trigger finger, right middle finger 03/01/2018   Hyperglycemia 06/10/2017   Solitary pulmonary nodule 05/26/2016   CAD (coronary artery disease) 05/26/2016   Nonischemic cardiomyopathy (HCC) 04/23/2016   Chronic systolic heart failure (HCC) 12/01/2015   Chest pressure  12/01/2015   Left chest pressure 03/19/2015   Insomnia 01/30/2015   Erectile dysfunction 01/30/2015   Cerebral infarction (HCC) 01/30/2015   Former smoker 01/30/2015   Atrial fibrillation and flutter (HCC) 01/01/2015   Allergic rhinitis 10/11/2014   BPH associated with nocturia 08/14/2014   Ventricular tachycardia, non-sustained (HCC) 01/25/2013   Sinoatrial node dysfunction (HCC)    SPINAL STENOSIS, LUMBAR 08/22/2010   GERD 03/17/2010   Cardiac pacemaker in situ 12/28/2009   Gout 11/11/2009   Hyperlipidemia 07/12/2007   NEUROPATHY, ISCHEMIC OPTIC 06/02/2007   Essential hypertension 06/02/2007    PCP: [name]., MD  REFERRING PROVIDER: [name], DO  REFERRING DIAG: M54.42,M54.41,G89.29 (ICD-10-CM) - Chronic bilateral low back pain with bilateral sciatica M51.362 (ICD-10-CM) - Degeneration of intervertebral disc of lumbar region with discogenic back pain and lower extremity pain  Rationale for Evaluation and Treatment: Rehabilitation  THERAPY DIAG:  Other low back pain  Other abnormalities of gait and mobility  Difficulty in walking, not elsewhere classified  Muscle weakness (generalized)  ONSET DATE: February 2024 first visit to Dr Leonce  SUBJECTIVE:                                                                                                                                                                                           SUBJECTIVE STATEMENT: Patient states no new complaints.  States that he did some of his exercises while waiting for the accountant yesterday.  States pain still 0-1/10.  PERTINENT HISTORY:  [name] (next upcoming trip 7/14 for a [place] cruise) OA, trigger fingers, right hip arthroplasty, pacemaker  PAIN:  Are you having pain? Yes: NPRS scale: 0-1/10 Pain location: low back Pain description: discomfort Aggravating factors: worst first thing in the morning or after lying on his couch, swimming Relieving  factors: medication, movement  PRECAUTIONS: ICD/Pacemaker  RED FLAGS: None   WEIGHT BEARING RESTRICTIONS: No  FALLS:  Has patient fallen in last 6 months? Yes. Number of falls 1 fall.  Tripped over a chair while traveling at [place]  LIVING ENVIRONMENT: Lives with: lives with their spouse Lives in: House/apartment Stairs: one story Has following equipment at home: None  OCCUPATION: Retired  PLOF: Independent and Leisure: traveling (many upcoming trips planned), swimming  PATIENT GOALS: To feel better and not have to be reliant on pain medication as much and improve balance and flexibility.  NEXT MD VISIT: Follow up with Dr Leonce as needed  OBJECTIVE:  Note: Objective measures were completed at Evaluation unless otherwise noted.  DIAGNOSTIC FINDINGS:  Lumbar CT Scan on 02/04/2023: IMPRESSION: Multilevel degenerative changes which are most severe at the L3-4 level with significant spinal stenosis. No acute traumatic abnormalities identified.  PATIENT SURVEYS:  Modified Oswestry:  MODIFIED OSWESTRY DISABILITY SCALE   Date: 05/26/2024 Score  Pain intensity 2 =  Pain medication provides me with complete relief from pain.  2. Personal care (washing, dressing, etc.) 0 =  I can take care of myself normally without causing increased pain.  3. Lifting 2 = Pain prevents me from lifting heavy weights off the floor, activities (eg. sports, dancing). but I can manage if the weights are conveniently positioned (3) Pain prevents me from going out very often. (eg, on a table).  4. Walking 0 = Pain does not prevent me from walking any distance  5. Sitting 0 =  I can sit in any chair as long as I like.  6. Standing 1 =  I can stand as long as I want but, it increases my pain.  7. Sleeping 1 = I can sleep well only by using pain medication.  8. Social Life 0 = My social life is normal and does not increase my pain.  9. Traveling 2 =  My pain restricts my travel over 2 hours.  10.  Employment/ Homemaking 1 = My normal homemaking/job activities increase my pain, but I can still perform all that is required of me  Total 9 / 50 = 18.0 %   Interpretation of scores: Score Category Description  0-20% Minimal Disability The patient can cope with most living activities. Usually no treatment is indicated apart from advice on lifting, sitting and exercise  21-40% Moderate Disability The patient experiences more pain and difficulty with sitting, lifting and standing. Travel and social life are more difficult and they may be disabled from work. Personal care, sexual activity and sleeping are not grossly affected, and the patient can usually be managed by conservative means  41-60% Severe Disability Pain remains the main problem in this group, but activities of daily living are affected. These patients require a detailed investigation  61-80% Crippled Back pain impinges on all aspects of the patient's life. Positive intervention is required  81-100% Bed-bound  These patients are either bed-bound or exaggerating their symptoms  Bluford FORBES Zoe DELENA Karon DELENA, et al. Surgery versus conservative management of stable thoracolumbar fracture: the PRESTO feasibility RCT. [place] ([place]): [employer]; 2021 Nov. [facility] Technology Assessment, No. 25.62.) Appendix 3, Oswestry Disability Index category descriptors. Available from: FindJewelers.cz  Minimally Clinically Important Difference (MCID) = 12.8%  COGNITION: Overall cognitive status: Within functional limits for tasks assessed     SENSATION: Patient states that he has a neuropathy problem in his feet, but it has improved since starting Gabapentin   MUSCLE LENGTH: Hamstrings: increased tightness bilaterally Piriformis:  tightness bilat  POSTURE: rounded shoulders and forward head  LUMBAR ROM:   Eval:  Decreased by approx 50%  LOWER EXTREMITY ROM:     WFL  LOWER EXTREMITY MMT:     Eval:   LE grossly WNL, but grossly 4+ to 5-/5 in right hip Core strength grossly 4-/5 throughout  LUMBAR SPECIAL TESTS:  Slump test: Negative  FUNCTIONAL TESTS:  Eval: 5 times sit to stand: 11.87 sec Single Leg Stance:  right- 1.53 sec, left- 1.40 sec  GAIT: Distance walked: >500 ft Assistive device utilized: None Level of assistance: Complete Independence Comments: States that he has more difficulty with balance on unlevel surfaces (like grass) than firm surfaces.  TREATMENT DATE:  05/31/2024: Nustep level 5 x5 min with PT present to discuss status Seated hamstring stretch 2x20 sec bilat Seated piriformis stretch x20 sec bilat Sit to/from stand holding 5# kettlebell:  x10 with chest press, x10 with overhead press Seated modified Russian twist with 5# kettle bell 2x10 Seated blue pball rollout x10 Standing shoulder horizontal abduction with green tband 2x10 Standing shoulder ER with green tband 2x10 Standing shoulder rows with green tband 2x10 Seated lateral overhead reach with blue peanut ball x10 bilat Standing single leg clock tap with UE support x10 bilat Standing L counter stretch 2x20 sec Tandem gait at barre down and back x3 laps   05/29/2024 Nustep level 3 x6 min with PT present to discuss status Seated hamstring stretch 2x20 sec bilat Seated piriformis stretch 2x20 sec bilat Seated hip adduction ball squeeze 2x10 Seated hip abduction with yellow loop 2x10 Seated blue pball rollout x10 Tandem walking at barre with UE support as needed down and back x2 laps Standing rocker board for DF/PF x2 min Standing balance on rocker board in lateral position with finger touch x1 min   05/26/2024 Seated hamstring stretch x20 sec bilat Seated piriformis stretch x20 sec bilat Reviewed HEP and role of PT                                                                                                                      PATIENT EDUCATION:  Education details: Issued  HEP Person educated: Patient Education method: Explanation, Demonstration, and Handouts Education comprehension: verbalized understanding and returned demonstration  HOME EXERCISE PROGRAM: Access Code: [facility] URL: [email or web address] Date: 05/31/2024 Prepared by: Jarrell Tijuana Scheidegger  Exercises - Seated Transversus Abdominis Bracing  - 1 x daily - 7 x weekly - 2 sets - 10 reps - Seated Hamstring Stretch  - 1 x daily - 7 x weekly - 2 reps - 20 sec hold - Seated Piriformis Stretch  - 1 x daily - 7 x weekly - 2 reps - 20 sec hold - Single Leg Stance with Support  - 1 x daily - 7 x weekly - 2 reps - 20 sec hold - Shoulder External Rotation and Scapular Retraction with Resistance  - 1 x daily - 7 x weekly - 2 sets - 10 reps - Standing Shoulder Horizontal Abduction with Resistance  - 1 x daily - 7 x  weekly - 2 sets - 10 reps - Standing Shoulder Row with Anchored Resistance  - 1 x daily - 7 x weekly - 2 sets - 10 reps - Shoulder extension with resistance - Neutral  - 1 x daily - 7 x weekly - 2 sets - 10 reps - Single Leg Balance with Clock Reach  - 1 x daily - 7 x weekly - 10 reps - Standing 'L' Stretch at Counter  - 1 x daily - 7 x weekly - 2 reps - 20 sec hold  ASSESSMENT:  CLINICAL IMPRESSION: Mr Rezendes presents to skilled PT stating that he is trying to incorporate the exercises into more parts of his day.  Patient is eager to progress and has active participation and engagement throughout session.  Patient able to progress with increase with general strengthening and add in exercises with theraband today.  Patient requires minimal cuing throughout for core muscle engagement.  Patient provided with updated HEP handouts and given green theraband for home use.  Patient continues to require skilled PT to progress towards goal related activities.   OBJECTIVE IMPAIRMENTS: decreased balance, decreased strength, increased muscle spasms, impaired flexibility, postural dysfunction, and  pain.   ACTIVITY LIMITATIONS: lifting, standing, and stairs  PARTICIPATION LIMITATIONS: driving, community activity, and yard work  PERSONAL FACTORS: Time since onset of injury/illness/exacerbation and 1-2 comorbidities: OA, right hip arthroplasty are also affecting patient's functional outcome.   REHAB POTENTIAL: Good  CLINICAL DECISION MAKING: Stable/uncomplicated  EVALUATION COMPLEXITY: Low   GOALS: Goals reviewed with patient? Yes  SHORT TERM GOALS: Target date: 06/16/2024  Patient will be independent with initial HEP. Baseline: Goal status: Met on 05/31/24  2.  Patient will demonstrate and verbalize knowledge of proper lifting techniques to allow him to lift suitcases for travel. Baseline:  Goal status: Ongoing   LONG TERM GOALS: Target date: 07/21/2024  Patient will be independent with advanced HEP to allow for self progression after discharge. Baseline:  Goal status: INITIAL  2.  Patient will improve modified Oswestry to not greater than 5% to demonstrate improved functional mobility. Baseline: 18% Goal status: INITIAL  3.  Patient will increase right hip strength to [facility] to allow him to navigate stairs with reciprocal pattern without difficulty/discomfort. Baseline:  Goal status: INITIAL  4.  Patient will increase bilateral single leg stance to greater than 10 seconds to decrease patients fall risk. Baseline: right- 1.53 sec, left- 1.40 sec Goal status: INITIAL  5.  Patient will report ability to ambulate over various unlevel surfaces for desired length (at least greater than 30 minutes) without a loss of balance to allow patient to travel and sight-see. Baseline:  Goal status: INITIAL   PLAN:  PT FREQUENCY: 1-2x/week  PT DURATION: 8 weeks  PLANNED INTERVENTIONS: 97164- PT Re-evaluation, 97750- Physical Performance Testing, 97110-Therapeutic exercises, 97530- Therapeutic activity, [id number]- Neuromuscular re-education, 97535- Self Care, [id number]- Manual therapy,  [id number]- Gait training, [phone]- Canalith repositioning, [id number]- Aquatic Therapy, [phone]- Electrical stimulation (unattended), [id number]- Electrical stimulation (manual), [id number]- Ultrasound, [id number]- Traction (mechanical), [id number]- Ionotophoresis 4mg /ml Dexamethasone , [id number] (1-2 muscles), [id number] (3+ muscles)- Dry Needling, Patient/Family education, Balance training, Stair training, Taping, Joint mobilization, Joint manipulation, Spinal manipulation, Spinal mobilization, Cryotherapy, and Moist heat.  PLAN FOR NEXT SESSION: Assess and progress HEP as indicated, strengthening, flexibility, manual/dry needling as indicated    Jarrell Laming, PT, DPT 05/31/24, 8:29 AM  [facility] [address] [place], [place] [address] Phone # [phone] Fax [phone]

## 2024-06-01 ENCOUNTER — Ambulatory Visit: Admitting: Sports Medicine

## 2024-06-01 VITALS — BP 112/60 | HR 81 | Ht 68.0 in | Wt 170.8 lb

## 2024-06-01 DIAGNOSIS — M65342 Trigger finger, left ring finger: Secondary | ICD-10-CM

## 2024-06-01 DIAGNOSIS — M65332 Trigger finger, left middle finger: Secondary | ICD-10-CM | POA: Diagnosis not present

## 2024-06-01 NOTE — Patient Instructions (Addendum)
 Injected hand today.  Follow up as needed

## 2024-06-05 DIAGNOSIS — R0602 Shortness of breath: Secondary | ICD-10-CM | POA: Diagnosis not present

## 2024-06-05 DIAGNOSIS — E782 Mixed hyperlipidemia: Secondary | ICD-10-CM | POA: Diagnosis not present

## 2024-06-05 DIAGNOSIS — I495 Sick sinus syndrome: Secondary | ICD-10-CM | POA: Diagnosis not present

## 2024-06-05 DIAGNOSIS — Z8679 Personal history of other diseases of the circulatory system: Secondary | ICD-10-CM | POA: Diagnosis not present

## 2024-06-05 DIAGNOSIS — I25118 Atherosclerotic heart disease of native coronary artery with other forms of angina pectoris: Secondary | ICD-10-CM | POA: Diagnosis not present

## 2024-06-06 ENCOUNTER — Other Ambulatory Visit: Payer: Self-pay | Admitting: Family Medicine

## 2024-06-06 ENCOUNTER — Other Ambulatory Visit: Payer: Self-pay | Admitting: Sports Medicine

## 2024-06-06 DIAGNOSIS — M47816 Spondylosis without myelopathy or radiculopathy, lumbar region: Secondary | ICD-10-CM

## 2024-06-06 DIAGNOSIS — G8929 Other chronic pain: Secondary | ICD-10-CM

## 2024-06-06 DIAGNOSIS — M51362 Other intervertebral disc degeneration, lumbar region with discogenic back pain and lower extremity pain: Secondary | ICD-10-CM

## 2024-06-06 NOTE — Therapy (Signed)
 OUTPATIENT PHYSICAL THERAPY TREATMENT NOTE   Patient Name: [name] MRN: [id number] DOB:[birth year], 78 y.o., male Today's Date: 06/07/2024  END OF SESSION:  PT End of Session - 06/07/24 1057     Visit Number 4    Date for PT Re-Evaluation 07/21/24    Authorization Type [employer] 05/26/2024-07/21/2024-auth#[id number]    Authorization - Visit Number 4    Authorization - Number of Visits 16    Progress Note Due on Visit 10    PT Start Time 1015    PT Stop Time 1057    PT Time Calculation (min) 42 min           Past Medical History:  Diagnosis Date   Abscess    right posterior neck   Anticoagulation adequate 06/07/2019   Overview:  SSS, paroxAFib/Flutter, CHADSvasc= 3 age, h/o cva; chronic OAC Xarelto    Atrial fibrillation and flutter (HCC) 01/01/2015   Noted 2016. Xarelto     BPH associated with nocturia 08/14/2014   Finasteride , flomax .  0-3x nocturia. PSA to 14-->eval urology [facility]. PSA trended back down to about 2.6 per records in 2015 then placed on finasteride - had significant voiding issues when spiked to 14- improved on meds    Bradycardia    CAD (coronary artery disease) 05/26/2016   Coronary CTA_ 50-75% D1 off CT report    Cardiac pacemaker in situ 12/28/2009   SA node dysfunction as cause    Chest pressure 12/01/2015   Overview:  atypical CP, [facility] nuclear perfusion imaging w/ no inarct, no ischemia, EF-40% NICM   Chronic systolic heart failure (HCC) 12/01/2015   Overview:  [facility] nuclear perfusion imaging study, no inarct, no ischemia, EF-40% NICM   CVA (cerebral vascular accident) (HCC)    Double vision 07/04/2018   Elevated LFTs    Erectile dysfunction 01/30/2015   cialis  prn    External hemorrhoids    Fitting or adjustment of cardiac pacemaker 06/07/2019   Overview:  [place] DR [id number], RA and RV leads [id number]:  [id number] and [id number]   Former smoker 01/30/2015   Former smoker. 20 pack years quit 92. Patient declined AAA screen.     GERD (gastroesophageal  reflux disease)    Gout 11/11/2009   Qualifier: Diagnosis of  By: Gardenia Pao     History of nuclear stress test 06/07/2019   Overview:  [facility], no inarct, no ischemia, EF-40% NICM   HTN (hypertension)    Hyperglycemia 06/10/2017   110s CBG 2018   Hyperlipidemia 07/12/2007   Crestor  20mg  daily with some myalgias; fish oil. Trig 200-500 despite this. LDL ok.        Insomnia 01/30/2015   ambien  5mg  prn. May refill.     Ischemic optic neuropathy    Left chest pressure 03/19/2015   Other and unspecified hyperlipidemia    Sinoatrial node dysfunction (HCC)    Spinal stenosis    Trigger finger, right middle finger 03/01/2018   Past Surgical History:  Procedure Laterality Date   ABSCESS DRAINAGE     righ tposterior neck   CARDIAC CATHETERIZATION  1996   CHOLECYSTECTOMY  02/07/2003   COLONOSCOPY  06/23/2004   EYE SURGERY     left eye   left ingunial hernia  06/08/2011   PACEMAKER INSERTION     right hip replacement     2012 Dr. Hiram   Patient Active Problem List   Diagnosis Date Noted   Aortic atherosclerosis (HCC) 08/01/2020   Fatty liver 08/01/2020   Paroxysmal atrial fibrillation (  HCC) 07/27/2019   Anticoagulation adequate 06/07/2019   Convergence insufficiency 06/07/2019   Fitting or adjustment of cardiac pacemaker 06/07/2019   Pacemaker-dependent due to native cardiac rhythm insufficient to support life 06/07/2019   Palpitations 06/07/2019   History of nuclear stress test 06/07/2019   S/P cardiac cath 06/07/2019   Non-occlusive coronary artery disease 06/07/2019   Double vision 07/04/2018   Trigger finger, right middle finger 03/01/2018   Hyperglycemia 06/10/2017   Solitary pulmonary nodule 05/26/2016   CAD (coronary artery disease) 05/26/2016   Nonischemic cardiomyopathy (HCC) 04/23/2016   Chronic systolic heart failure (HCC) 12/01/2015   Chest pressure 12/01/2015   Left chest pressure 03/19/2015   Insomnia 01/30/2015   Erectile dysfunction 01/30/2015   Cerebral  infarction (HCC) 01/30/2015   Former smoker 01/30/2015   Atrial fibrillation and flutter (HCC) 01/01/2015   Allergic rhinitis 10/11/2014   BPH associated with nocturia 08/14/2014   Ventricular tachycardia, non-sustained (HCC) 01/25/2013   Sinoatrial node dysfunction (HCC)    SPINAL STENOSIS, LUMBAR 08/22/2010   GERD 03/17/2010   Cardiac pacemaker in situ 12/28/2009   Gout 11/11/2009   Hyperlipidemia 07/12/2007   NEUROPATHY, ISCHEMIC OPTIC 06/02/2007   Essential hypertension 06/02/2007    PCP: Nathan Garnette KIDD., MD  REFERRING PROVIDER: [name], DO  REFERRING DIAG: M54.42,M54.41,G89.29 (ICD-10-CM) - Chronic bilateral low back pain with bilateral sciatica M51.362 (ICD-10-CM) - Degeneration of intervertebral disc of lumbar region with discogenic back pain and lower extremity pain  Rationale for Evaluation and Treatment: Rehabilitation  THERAPY DIAG:  Other low back pain  Other abnormalities of gait and mobility  Difficulty in walking, not elsewhere classified  Muscle weakness (generalized)  ONSET DATE: February 2024 first visit to Dr Nathan  SUBJECTIVE:                                                                                                                                                                                           SUBJECTIVE STATEMENT: I think it's either the couch (lying), the swimming, or bed that bother it. They create a sway back   PERTINENT HISTORY:  [name] frequently (next upcoming trip 7/14 for a [employer] cruise) OA, trigger fingers, right hip arthroplasty, pacemaker  PAIN:  Are you having pain? Yes: NPRS scale: 0-1/10 Pain location: low back Pain description: discomfort Aggravating factors: worst first thing in the morning or after lying on his couch, swimming Relieving factors: medication, movement  PRECAUTIONS: ICD/Pacemaker  RED FLAGS: None   WEIGHT BEARING RESTRICTIONS: No  FALLS:  Has patient fallen in last 6 months?  Yes. Number of falls 1 fall.  Tripped over a chair while traveling at  [place]  LIVING ENVIRONMENT: Lives with: lives with their spouse Lives in: House/apartment Stairs: one story Has following equipment at home: None  OCCUPATION: Retired  PLOF: Independent and Leisure: traveling (many upcoming trips planned), swimming  PATIENT GOALS: To feel better and not have to be reliant on pain medication as much and improve balance and flexibility.  NEXT MD VISIT: Follow up with Dr Nathan as needed  OBJECTIVE:  Note: Objective measures were completed at Evaluation unless otherwise noted.  DIAGNOSTIC FINDINGS:  Lumbar CT Scan on 02/04/2023: IMPRESSION: Multilevel degenerative changes which are most severe at the L3-4 level with significant spinal stenosis. No acute traumatic abnormalities identified.  PATIENT SURVEYS:  Modified Oswestry:  MODIFIED OSWESTRY DISABILITY SCALE   Date: 05/26/2024 Score  Pain intensity 2 =  Pain medication provides me with complete relief from pain.  2. Personal care (washing, dressing, etc.) 0 =  I can take care of myself normally without causing increased pain.  3. Lifting 2 = Pain prevents me from lifting heavy weights off the floor, activities (eg. sports, dancing). but I can manage if the weights are conveniently positioned (3) Pain prevents me from going out very often. (eg, on a table).  4. Walking 0 = Pain does not prevent me from walking any distance  5. Sitting 0 =  I can sit in any chair as long as I like.  6. Standing 1 =  I can stand as long as I want but, it increases my pain.  7. Sleeping 1 = I can sleep well only by using pain medication.  8. Social Life 0 = My social life is normal and does not increase my pain.  9. Traveling 2 =  My pain restricts my travel over 2 hours.  10. Employment/ Homemaking 1 = My normal homemaking/job activities increase my pain, but I can still perform all that is required of me  Total 9 / 50 = 18.0 %   Interpretation  of scores: Score Category Description  0-20% Minimal Disability The patient can cope with most living activities. Usually no treatment is indicated apart from advice on lifting, sitting and exercise  21-40% Moderate Disability The patient experiences more pain and difficulty with sitting, lifting and standing. Travel and social life are more difficult and they may be disabled from work. Personal care, sexual activity and sleeping are not grossly affected, and the patient can usually be managed by conservative means  41-60% Severe Disability Pain remains the main problem in this group, but activities of daily living are affected. These patients require a detailed investigation  61-80% Crippled Back pain impinges on all aspects of the patient's life. Positive intervention is required  81-100% Bed-bound  These patients are either bed-bound or exaggerating their symptoms  Bluford FORBES Zoe DELENA Karon DELENA, et al. Surgery versus conservative management of stable thoracolumbar fracture: the PRESTO feasibility RCT. [place] ([place]): [employer]; 2021 Nov. [facility] Technology Assessment, No. 25.62.) Appendix 3, Oswestry Disability Index category descriptors. Available from: FindJewelers.cz  Minimally Clinically Important Difference (MCID) = 12.8%  COGNITION: Overall cognitive status: Within functional limits for tasks assessed     SENSATION: Patient states that he has a neuropathy problem in his feet, but it has improved since starting Gabapentin   MUSCLE LENGTH: Hamstrings: increased tightness bilaterally Piriformis:  tightness bilat  POSTURE: rounded shoulders and forward head   LUMBAR ROM:   Eval:  Decreased by approx 50%  LOWER EXTREMITY ROM:     WFL  LOWER EXTREMITY MMT:  Eval:   LE grossly WNL, but grossly 4+ to 5-/5 in right hip Core strength grossly 4-/5 throughout  LUMBAR SPECIAL TESTS:  Slump test: Negative  FUNCTIONAL TESTS:   Eval: 5 times sit to stand: 11.87 sec Single Leg Stance:  right- 1.53 sec, left- 1.40 sec  GAIT: Distance walked: >500 ft Assistive device utilized: None Level of assistance: Complete Independence Comments: States that he has more difficulty with balance on unlevel surfaces (like grass) than firm surfaces.  TREATMENT DATE:  06/07/2024: Nustep level 5 x5 min with PT present to discuss status Seated hamstring stretch 2x20 sec bilat Seated piriformis stretch 2x20 sec bilat Sit to/from stand holding 5# kettlebell:  x 10 with chest press, x 10 with overhead press Seated modified Russian twist with 5# kettle bell 2x10 Seated blue pball rollout x10 and bil x 10 ea Seated with good upright posture shoulder horizontal abduction with green tband 1x10, blue 1x10  Standing shoulder ER with green tband 2x10 Standing shoulder rows with green tband 2x10 try Matrix next visit Standing single leg clock tap with UE support x10 bilat Standing B counter stretch 2x10 sec ea  Tandem gait at barre down and back x3 laps Tandem stance multiple reps Stair taps 2x10  Hip hikes x 10 B  05/31/2024: Nustep level 5 x5 min with PT present to discuss status Seated hamstring stretch 2x20 sec bilat Seated piriformis stretch x20 sec bilat Sit to/from stand holding 5# kettlebell:  x10 with chest press, x10 with overhead press Seated modified Russian twist with 5# kettle bell 2x10 Seated blue pball rollout x10 Standing shoulder horizontal abduction with green tband 2x10 Standing shoulder ER with green tband 2x10 Standing shoulder rows with green tband 2x10 Seated lateral overhead reach with blue peanut ball x10 bilat Standing single leg clock tap with UE support x10 bilat Standing L counter stretch 2x20 sec Tandem gait at barre down and back x3 laps    PATIENT EDUCATION:  Education details: Issued HEP Person educated: Patient Education method: Programmer, multimedia, Facilities manager, and Handouts Education  comprehension: verbalized understanding and returned demonstration  HOME EXERCISE PROGRAM: Access Code: [facility] URL: [email or web address] Date: 05/31/2024 Prepared by: Jarrell Menke  Exercises - Seated Transversus Abdominis Bracing  - 1 x daily - 7 x weekly - 2 sets - 10 reps - Seated Hamstring Stretch  - 1 x daily - 7 x weekly - 2 reps - 20 sec hold - Seated Piriformis Stretch  - 1 x daily - 7 x weekly - 2 reps - 20 sec hold - Single Leg Stance with Support  - 1 x daily - 7 x weekly - 2 reps - 20 sec hold - Shoulder External Rotation and Scapular Retraction with Resistance  - 1 x daily - 7 x weekly - 2 sets - 10 reps - Standing Shoulder Horizontal Abduction with Resistance  - 1 x daily - 7 x weekly - 2 sets - 10 reps - Standing Shoulder Row with Anchored Resistance  - 1 x daily - 7 x weekly - 2 sets - 10 reps - Shoulder extension with resistance - Neutral  - 1 x daily - 7 x weekly - 2 sets - 10 reps - Single Leg Balance with Clock Reach  - 1 x daily - 7 x weekly - 10 reps - Standing 'L' Stretch at Counter  - 1 x daily - 7 x weekly - 2 reps - 20 sec hold  ASSESSMENT:  CLINICAL IMPRESSION: Patient able to progress resistance and balance activities  today.  Advised to step back balacne to where he can do the exercise, but is challenged such as semi tandem vs. Tandem stance. He demonstrates poor sitting posture and can correct with cues. Friday is his last visit until August. Progress HEP for vacation next visit.   OBJECTIVE IMPAIRMENTS: decreased balance, decreased strength, increased muscle spasms, impaired flexibility, postural dysfunction, and pain.   ACTIVITY LIMITATIONS: lifting, standing, and stairs  PARTICIPATION LIMITATIONS: driving, community activity, and yard work  PERSONAL FACTORS: Time since onset of injury/illness/exacerbation and 1-2 comorbidities: OA, right hip arthroplasty are also affecting patient's functional outcome.   REHAB POTENTIAL:  Good  CLINICAL DECISION MAKING: Stable/uncomplicated  EVALUATION COMPLEXITY: Low   GOALS: Goals reviewed with patient? Yes  SHORT TERM GOALS: Target date: 06/16/2024  Patient will be independent with initial HEP. Baseline: Goal status: Met on 05/31/24  2.  Patient will demonstrate and verbalize knowledge of proper lifting techniques to allow him to lift suitcases for travel. Baseline:  Goal status: Ongoing   LONG TERM GOALS: Target date: 07/21/2024  Patient will be independent with advanced HEP to allow for self progression after discharge. Baseline:  Goal status: INITIAL  2.  Patient will improve modified Oswestry to not greater than 5% to demonstrate improved functional mobility. Baseline: 18% Goal status: INITIAL  3.  Patient will increase right hip strength to [facility] to allow him to navigate stairs with reciprocal pattern without difficulty/discomfort. Baseline:  Goal status: INITIAL  4.  Patient will increase bilateral single leg stance to greater than 10 seconds to decrease patients fall risk. Baseline: right- 1.53 sec, left- 1.40 sec Goal status: INITIAL  5.  Patient will report ability to ambulate over various unlevel surfaces for desired length (at least greater than 30 minutes) without a loss of balance to allow patient to travel and sight-see. Baseline:  Goal status: INITIAL   PLAN:  PT FREQUENCY: 1-2x/week  PT DURATION: 8 weeks  PLANNED INTERVENTIONS: 97164- PT Re-evaluation, 97750- Physical Performance Testing, 97110-Therapeutic exercises, 97530- Therapeutic activity, [id number]- Neuromuscular re-education, 97535- Self Care, 02859- Manual therapy, [id number]- Gait training, [phone]- Canalith repositioning, [id number]- Aquatic Therapy, [phone]- Electrical stimulation (unattended), [phone]- Electrical stimulation (manual), [id number]- Ultrasound, [id number]- Traction (mechanical), [id number]- Ionotophoresis 4mg /ml Dexamethasone , 79439 (1-2 muscles), 20561 (3+ muscles)- Dry Needling, Patient/Family  education, Balance training, Stair training, Taping, Joint mobilization, Joint manipulation, Spinal manipulation, Spinal mobilization, Cryotherapy, and Moist heat.  PLAN FOR NEXT SESSION: Assess and progress HEP as indicated, strengthening, flexibility, manual/dry needling as indicated    Mliss Cummins, PT  06/07/24, 11:03 AM  [facility] [address], [address] [place], [place] [address] Phone # [phone] Fax [phone]

## 2024-06-07 ENCOUNTER — Ambulatory Visit: Admitting: Physical Therapy

## 2024-06-07 ENCOUNTER — Ambulatory Visit: Payer: Self-pay | Admitting: Rehabilitative and Restorative Service Providers"

## 2024-06-07 ENCOUNTER — Encounter: Payer: Self-pay | Admitting: Physical Therapy

## 2024-06-07 DIAGNOSIS — R2689 Other abnormalities of gait and mobility: Secondary | ICD-10-CM

## 2024-06-07 DIAGNOSIS — M6281 Muscle weakness (generalized): Secondary | ICD-10-CM | POA: Diagnosis not present

## 2024-06-07 DIAGNOSIS — R262 Difficulty in walking, not elsewhere classified: Secondary | ICD-10-CM | POA: Diagnosis not present

## 2024-06-07 DIAGNOSIS — M5459 Other low back pain: Secondary | ICD-10-CM | POA: Diagnosis not present

## 2024-06-08 NOTE — Therapy (Signed)
 OUTPATIENT PHYSICAL THERAPY TREATMENT NOTE   Patient Name: [name] MRN: [id number] DOB:1946/08/27, 78 y.o., male Today's Date: 06/09/2024  END OF SESSION:  PT End of Session - 06/09/24 0933     Visit Number 5    Date for PT Re-Evaluation 07/21/24    Authorization Type [employer] 05/26/2024-07/21/2024-auth#[id number]    Authorization - Visit Number 5    Authorization - Number of Visits 16    Progress Note Due on Visit 10    PT Start Time 0933    PT Stop Time 1016    PT Time Calculation (min) 43 min    Activity Tolerance Patient tolerated treatment well    Behavior During Therapy [facility] for tasks assessed/performed            Past Medical History:  Diagnosis Date   Abscess    right posterior neck   Anticoagulation adequate 06/07/2019   Overview:  SSS, paroxAFib/Flutter, CHADSvasc= 3 age, h/o cva; chronic OAC Xarelto    Atrial fibrillation and flutter (HCC) 01/01/2015   Noted 2016. Xarelto     BPH associated with nocturia 08/14/2014   Finasteride , flomax .  0-3x nocturia. PSA to 14-->eval urology [facility]. PSA trended back down to about 2.6 per records in 2015 then placed on finasteride - had significant voiding issues when spiked to 14- improved on meds    Bradycardia    CAD (coronary artery disease) 05/26/2016   Coronary CTA_ 50-75% D1 off CT report    Cardiac pacemaker in situ 12/28/2009   SA node dysfunction as cause    Chest pressure 12/01/2015   Overview:  atypical CP, [facility] nuclear perfusion imaging w/ no inarct, no ischemia, EF-40% NICM   Chronic systolic heart failure (HCC) 12/01/2015   Overview:  [facility] nuclear perfusion imaging study, no inarct, no ischemia, EF-40% NICM   CVA (cerebral vascular accident) (HCC)    Double vision 07/04/2018   Elevated LFTs    Erectile dysfunction 01/30/2015   cialis  prn    External hemorrhoids    Fitting or adjustment of cardiac pacemaker 06/07/2019   Overview:  Biotronik [place] DR [id number], RA and RV leads [id number]:  [id number] and [id number]    Former smoker 01/30/2015   Former smoker. 20 pack years quit 92. Patient declined AAA screen.     GERD (gastroesophageal reflux disease)    Gout 11/11/2009   Qualifier: Diagnosis of  By: Gardenia Pao     History of nuclear stress test 06/07/2019   Overview:  [facility], no inarct, no ischemia, EF-40% NICM   HTN (hypertension)    Hyperglycemia 06/10/2017   110s CBG 2018   Hyperlipidemia 07/12/2007   Crestor  20mg  daily with some myalgias; fish oil. Trig 200-500 despite this. LDL ok.        Insomnia 01/30/2015   ambien  5mg  prn. May refill.     Ischemic optic neuropathy    Left chest pressure 03/19/2015   Other and unspecified hyperlipidemia    Sinoatrial node dysfunction (HCC)    Spinal stenosis    Trigger finger, right middle finger 03/01/2018   Past Surgical History:  Procedure Laterality Date   ABSCESS DRAINAGE     righ tposterior neck   CARDIAC CATHETERIZATION  1996   CHOLECYSTECTOMY  02/07/2003   COLONOSCOPY  06/23/2004   EYE SURGERY     left eye   left ingunial hernia  06/08/2011   PACEMAKER INSERTION     right hip replacement     2012 Dr. Hiram   Patient Active Problem List  Diagnosis Date Noted   Aortic atherosclerosis (HCC) 08/01/2020   Fatty liver 08/01/2020   Paroxysmal atrial fibrillation (HCC) 07/27/2019   Anticoagulation adequate 06/07/2019   Convergence insufficiency 06/07/2019   Fitting or adjustment of cardiac pacemaker 06/07/2019   Pacemaker-dependent due to native cardiac rhythm insufficient to support life 06/07/2019   Palpitations 06/07/2019   History of nuclear stress test 06/07/2019   S/P cardiac cath 06/07/2019   Non-occlusive coronary artery disease 06/07/2019   Double vision 07/04/2018   Trigger finger, right middle finger 03/01/2018   Hyperglycemia 06/10/2017   Solitary pulmonary nodule 05/26/2016   CAD (coronary artery disease) 05/26/2016   Nonischemic cardiomyopathy (HCC) 04/23/2016   Chronic systolic heart failure (HCC) 12/01/2015   Chest  pressure 12/01/2015   Left chest pressure 03/19/2015   Insomnia 01/30/2015   Erectile dysfunction 01/30/2015   Cerebral infarction (HCC) 01/30/2015   Former smoker 01/30/2015   Atrial fibrillation and flutter (HCC) 01/01/2015   Allergic rhinitis 10/11/2014   BPH associated with nocturia 08/14/2014   Ventricular tachycardia, non-sustained (HCC) 01/25/2013   Sinoatrial node dysfunction (HCC)    SPINAL STENOSIS, LUMBAR 08/22/2010   GERD 03/17/2010   Cardiac pacemaker in situ 12/28/2009   Gout 11/11/2009   Hyperlipidemia 07/12/2007   NEUROPATHY, ISCHEMIC OPTIC 06/02/2007   Essential hypertension 06/02/2007    PCP: [name]., MD  REFERRING PROVIDER: [name], DO  REFERRING DIAG: M54.42,M54.41,G89.29 (ICD-10-CM) - Chronic bilateral low back pain with bilateral sciatica M51.362 (ICD-10-CM) - Degeneration of intervertebral disc of lumbar region with discogenic back pain and lower extremity pain  Rationale for Evaluation and Treatment: Rehabilitation  THERAPY DIAG:  Other low back pain  Other abnormalities of gait and mobility  Difficulty in walking, not elsewhere classified  Muscle weakness (generalized)  ONSET DATE: February 2024 first visit to Dr Leonce  SUBJECTIVE:                                                                                                                                                                                           SUBJECTIVE STATEMENT: I feel good.  No pain I stretched a little bit this morning.   PERTINENT HISTORY:  [name] frequently (next upcoming trip 7/14 for a [place] cruise) OA, trigger fingers, right hip arthroplasty, pacemaker  PAIN:  Are you having pain? Yes: NPRS scale: 0-1/10 Pain location: low back Pain description: discomfort Aggravating factors: worst first thing in the morning or after lying on his couch, swimming Relieving factors: medication, movement  PRECAUTIONS: ICD/Pacemaker  RED  FLAGS: None   WEIGHT BEARING RESTRICTIONS: No  FALLS:  Has patient fallen in last 6 months? Yes.  Number of falls 1 fall.  Tripped over a chair while traveling at [place]  LIVING ENVIRONMENT: Lives with: lives with their spouse Lives in: House/apartment Stairs: one story Has following equipment at home: None  OCCUPATION: Retired  PLOF: Independent and Leisure: traveling (many upcoming trips planned), swimming  PATIENT GOALS: To feel better and not have to be reliant on pain medication as much and improve balance and flexibility.  NEXT MD VISIT: Follow up with Dr Leonce as needed  OBJECTIVE:  Note: Objective measures were completed at Evaluation unless otherwise noted.  DIAGNOSTIC FINDINGS:  Lumbar CT Scan on 02/04/2023: IMPRESSION: Multilevel degenerative changes which are most severe at the L3-4 level with significant spinal stenosis. No acute traumatic abnormalities identified.  PATIENT SURVEYS:  Modified Oswestry:  MODIFIED OSWESTRY DISABILITY SCALE   Date: 05/26/2024 Score  Pain intensity 2 =  Pain medication provides me with complete relief from pain.  2. Personal care (washing, dressing, etc.) 0 =  I can take care of myself normally without causing increased pain.  3. Lifting 2 = Pain prevents me from lifting heavy weights off the floor, activities (eg. sports, dancing). but I can manage if the weights are conveniently positioned (3) Pain prevents me from going out very often. (eg, on a table).  4. Walking 0 = Pain does not prevent me from walking any distance  5. Sitting 0 =  I can sit in any chair as long as I like.  6. Standing 1 =  I can stand as long as I want but, it increases my pain.  7. Sleeping 1 = I can sleep well only by using pain medication.  8. Social Life 0 = My social life is normal and does not increase my pain.  9. Traveling 2 =  My pain restricts my travel over 2 hours.  10. Employment/ Homemaking 1 = My normal homemaking/job activities increase my  pain, but I can still perform all that is required of me  Total 9 / 50 = 18.0 %   Interpretation of scores: Score Category Description  0-20% Minimal Disability The patient can cope with most living activities. Usually no treatment is indicated apart from advice on lifting, sitting and exercise  21-40% Moderate Disability The patient experiences more pain and difficulty with sitting, lifting and standing. Travel and social life are more difficult and they may be disabled from work. Personal care, sexual activity and sleeping are not grossly affected, and the patient can usually be managed by conservative means  41-60% Severe Disability Pain remains the main problem in this group, but activities of daily living are affected. These patients require a detailed investigation  61-80% Crippled Back pain impinges on all aspects of the patient's life. Positive intervention is required  81-100% Bed-bound  These patients are either bed-bound or exaggerating their symptoms  Bluford FORBES Zoe DELENA Karon DELENA, et al. Surgery versus conservative management of stable thoracolumbar fracture: the PRESTO feasibility RCT. [place] ([place]): [employer]; 2021 Nov. [facility] Technology Assessment, No. 25.62.) Appendix 3, Oswestry Disability Index category descriptors. Available from: FindJewel[employer].cz  Minimally Clinically Important Difference (MCID) = 12.8%  COGNITION: Overall cognitive status: Within functional limits for tasks assessed     SENSATION: Patient states that he has a neuropathy problem in his feet, but it has improved since starting Gabapentin   MUSCLE LENGTH: Hamstrings: increased tightness bilaterally Piriformis:  tightness bilat  POSTURE: rounded shoulders and forward head   LUMBAR ROM:   Eval:  Decreased by approx 50%  LOWER EXTREMITY ROM:     WFL  LOWER EXTREMITY MMT:    Eval:   LE grossly WNL, but grossly 4+ to 5-/5 in right hip Core strength  grossly 4-/5 throughout  LUMBAR SPECIAL TESTS:  Slump test: Negative  FUNCTIONAL TESTS:  Eval: 5 times sit to stand: 11.87 sec Single Leg Stance:  right- 1.53 sec, left- 1.40 sec  GAIT: Distance walked: >500 ft Assistive device utilized: None Level of assistance: Complete Independence Comments: States that he has more difficulty with balance on unlevel surfaces (like grass) than firm surfaces.  TREATMENT DATE:  06/09/2024: Nustep level 5 x5 min with PT present to discuss status Seated hamstring stretch 2x20 sec bilat Seated piriformis stretch 2x20 sec bilat Sit to/from stand holding 5# kettlebell:  x 10 with chest press, x 10 with overhead press Seated modified Russian twist with 5# kettle bell 2x10 Standing shoulder rows 10# and 15# 1x10 ea Standing B shouder ext green x 20 in split stance Resisted walking 10# 4 way (CGA with left direction) Standing single leg clock tap with UE support x10 bilat Hurdle step overs 2x10 B challenging on R Standing B counter stretch 2x10 sec ea  Stair taps 1x10     06/07/2024: Nustep level 5 x5 min with PT present to discuss status Seated hamstring stretch 2x20 sec bilat Seated piriformis stretch 2x20 sec bilat Sit to/from stand holding 5# kettlebell:  x 10 with chest press, x 10 with overhead press Seated modified Russian twist with 5# kettle bell 2x10 Seated blue pball rollout x10 and bil x 10 ea Seated with good upright posture shoulder horizontal abduction with green tband 1x10, blue 1x10  Standing shoulder ER with green tband 2x10 Standing shoulder rows with green tband 2x10 try Matrix next Standing single leg clock tap with UE support x10 bilat Standing B counter stretch 2x10 sec ea  Tandem gait at barre down and back x3 laps Tandem stance multiple reps Stair taps 2x10  Hip hikes x 10 B  05/31/2024: Nustep level 5 x5 min with PT present to discuss status Seated hamstring stretch 2x20 sec bilat Seated piriformis stretch x20 sec  bilat Sit to/from stand holding 5# kettlebell:  x10 with chest press, x10 with overhead press Seated modified Russian twist with 5# kettle bell 2x10 Seated blue pball rollout x10 Standing shoulder horizontal abduction with green tband 2x10 Standing shoulder ER with green tband 2x10 Standing shoulder rows with green tband 2x10 Seated lateral overhead reach with blue peanut ball x10 bilat Standing single leg clock tap with UE support x10 bilat Standing L counter stretch 2x20 sec Tandem gait at barre down and back x3 laps    PATIENT EDUCATION:  Education details: Issued HEP Person educated: Patient Education method: Programmer, multimedia, Facilities manager, and Handouts Education comprehension: verbalized understanding and returned demonstration  HOME EXERCISE PROGRAM: Access Code: [facility] URL: [email or web address] Date: 06/09/2024 Prepared by: Mliss  Exercises - Seated Transversus Abdominis Bracing  - 1 x daily - 7 x weekly - 2 sets - 10 reps - Seated Hamstring Stretch  - 1 x daily - 7 x weekly - 2 reps - 20 sec hold - Seated Piriformis Stretch  - 1 x daily - 7 x weekly - 2 reps - 20 sec hold - Single Leg Stance with Support  - 1 x daily - 7 x weekly - 2 reps - 20 sec hold - Shoulder External Rotation and Scapular Retraction with Resistance  - 1 x daily - 7 x weekly - 2 sets -  10 reps - Standing Shoulder Horizontal Abduction with Resistance  - 1 x daily - 7 x weekly - 2 sets - 10 reps - Standing Shoulder Row with Anchored Resistance  - 1 x daily - 7 x weekly - 2 sets - 10 reps - Shoulder extension with resistance - Neutral  - 1 x daily - 7 x weekly - 2 sets - 10 reps - Single Leg Balance with Clock Reach  - 1 x daily - 7 x weekly - 10 reps - Standing 'L' Stretch at Counter  - 1 x daily - 7 x weekly - 2 reps - 20 sec hold - Resisted Sit-to-Stand With Dumbbell at Chest  - 1 x daily - 3 x weekly - 2 sets - 10 reps - Side Step Overs with Cones and Unilateral Counter Support  - 1 x  daily - 3 x weekly - 2 sets - 10 reps  ASSESSMENT:  CLINICAL IMPRESSION: Patient is most challenged with SLS activities on the R LE. He is challenged by lateral step overs as well as narrow BOS activities. Resisted walking was beneficial in all planes, but especially challenging going to the left. HEP updated. Patient on hold until he returns from vacation in August.    OBJECTIVE IMPAIRMENTS: decreased balance, decreased strength, increased muscle spasms, impaired flexibility, postural dysfunction, and pain.   ACTIVITY LIMITATIONS: lifting, standing, and stairs  PARTICIPATION LIMITATIONS: driving, community activity, and yard work  PERSONAL FACTORS: Time since onset of injury/illness/exacerbation and 1-2 comorbidities: OA, right hip arthroplasty are also affecting patient's functional outcome.   REHAB POTENTIAL: Good  CLINICAL DECISION MAKING: Stable/uncomplicated  EVALUATION COMPLEXITY: Low   GOALS: Goals reviewed with patient? Yes  SHORT TERM GOALS: Target date: 06/16/2024  Patient will be independent with initial HEP. Baseline: Goal status: Met on 05/31/24  2.  Patient will demonstrate and verbalize knowledge of proper lifting techniques to allow him to lift suitcases for travel. Baseline:  Goal status: Ongoing   LONG TERM GOALS: Target date: 07/21/2024  Patient will be independent with advanced HEP to allow for self progression after discharge. Baseline:  Goal status: INITIAL  2.  Patient will improve modified Oswestry to not greater than 5% to demonstrate improved functional mobility. Baseline: 18% Goal status: INITIAL  3.  Patient will increase right hip strength to [facility] to allow him to navigate stairs with reciprocal pattern without difficulty/discomfort. Baseline:  Goal status: INITIAL  4.  Patient will increase bilateral single leg stance to greater than 10 seconds to decrease patients fall risk. Baseline: right- 1.53 sec, left- 1.40 sec Goal status:  INITIAL  5.  Patient will report ability to ambulate over various unlevel surfaces for desired length (at least greater than 30 minutes) without a loss of balance to allow patient to travel and sight-see. Baseline:  Goal status: INITIAL   PLAN:  PT FREQUENCY: 1-2x/week  PT DURATION: 8 weeks  PLANNED INTERVENTIONS: 97164- PT Re-evaluation, 97750- Physical Performance Testing, 97110-Therapeutic exercises, 97530- Therapeutic activity, [id number]- Neuromuscular re-education, 97535- Self Care, 02859- Manual therapy, [phone]- Gait training, [phone]- Canalith repositioning, [id number]- Aquatic Therapy, [phone]- Electrical stimulation (unattended), [phone]- Electrical stimulation (manual), [id number]- Ultrasound, [id number]- Traction (mechanical), [id number]- Ionotophoresis 4mg /ml Dexamethasone , 79439 (1-2 muscles), [id number] (3+ muscles)- Dry Needling, Patient/Family education, Balance training, Stair training, Taping, Joint mobilization, Joint manipulation, Spinal manipulation, Spinal mobilization, Cryotherapy, and Moist heat.  PLAN FOR NEXT SESSION: Assess and progress HEP as indicated, strengthening, flexibility, manual/dry needling as indicated    Mliss Cummins, PT  06/09/24, 10:18  AM  [facility] [address], [address] [place], [place] [address] Phone # [phone] Fax [phone]

## 2024-06-09 ENCOUNTER — Ambulatory Visit: Payer: Self-pay | Admitting: Physical Therapy

## 2024-06-09 ENCOUNTER — Encounter: Payer: Self-pay | Admitting: Physical Therapy

## 2024-06-09 DIAGNOSIS — M5459 Other low back pain: Secondary | ICD-10-CM | POA: Diagnosis not present

## 2024-06-09 DIAGNOSIS — M6281 Muscle weakness (generalized): Secondary | ICD-10-CM

## 2024-06-09 DIAGNOSIS — R2689 Other abnormalities of gait and mobility: Secondary | ICD-10-CM

## 2024-06-09 DIAGNOSIS — R262 Difficulty in walking, not elsewhere classified: Secondary | ICD-10-CM

## 2024-06-27 ENCOUNTER — Encounter: Payer: Self-pay | Admitting: Family Medicine

## 2024-06-29 ENCOUNTER — Ambulatory Visit: Admitting: Physician Assistant

## 2024-06-29 ENCOUNTER — Ambulatory Visit: Payer: Self-pay | Admitting: Physician Assistant

## 2024-06-29 VITALS — BP 120/78 | HR 82 | Temp 98.2°F | Ht 68.0 in | Wt 170.8 lb

## 2024-06-29 DIAGNOSIS — L989 Disorder of the skin and subcutaneous tissue, unspecified: Secondary | ICD-10-CM

## 2024-06-29 DIAGNOSIS — R300 Dysuria: Secondary | ICD-10-CM | POA: Diagnosis not present

## 2024-06-29 LAB — POCT URINALYSIS DIPSTICK
Bilirubin, UA: NEGATIVE
Blood, UA: NEGATIVE
Glucose, UA: NEGATIVE
Ketones, UA: NEGATIVE
Leukocytes, UA: NEGATIVE
Nitrite, UA: NEGATIVE
Protein, UA: NEGATIVE
Spec Grav, UA: 1.015 (ref 1.010–1.025)
Urobilinogen, UA: 0.2 U/dL
pH, UA: 6 (ref 5.0–8.0)

## 2024-06-29 NOTE — Progress Notes (Signed)
 Patient ID: [name], male    DOB: [birth year], 78 y.o.   MRN: [id number]   Assessment & Plan:  Skin lesion  Dysuria -     POCT urinalysis dipstick     Assessment & Plan   Skin lesion (scab) - R medial ankle - appears benign, reassured patient, likely secondary to delay in healing. Monitor at this time. Recheck at his next visit. Call if changes such as redness or pain to area. Pedal pulses normal.   Urinary concerns - POC UA normal. Benign abdomen. Limit caffeine. Keep hydrated. Monitor symptoms, recheck prn. This was a secondary concern to patient and thought he'd mention it while here.    F/up with PCP     Subjective:    Chief Complaint  Patient presents with   Foot Issue    Patient seen today for scab on right ankle that hasn't healed x2 months; burning in abdomen while urinating x2 weeks; urine is not dark or cloudy, pale yellow, no odor;     HPI Discussed the use of AI scribe software for clinical note transcription with the patient, who gave verbal consent to proceed.  History of Present Illness  78 yo male presenting with: Scab R medial ankle x 2 months, not bothering him but seems to be lasting longer than other wounds he's ever had. No pain or itching. Initially started larger, more red, seemed to having bleeding at that time. Doesn't recall hitting it on anything.  No hx skin cancers that he can recall.  Takes Xarelto  for A-fib.  Also complains of a burning feeling when he has to urinate. Mostly in the lower abdomen. No fever or chills. Maybe more frequency than normal, but not really sure. Color seems about the same. No odor to urine.     Past Medical History:  Diagnosis Date   Abscess    right posterior neck   Anticoagulation adequate 06/07/2019   Overview:  SSS, paroxAFib/Flutter, CHADSvasc= 3 age, h/o cva; chronic OAC Xarelto    Atrial fibrillation and flutter (HCC) 01/01/2015   Noted 2016. Xarelto     BPH associated with nocturia  08/14/2014   Finasteride , flomax .  0-3x nocturia. PSA to 14-->eval urology [facility]. PSA trended back down to about 2.6 per records in 2015 then placed on finasteride - had significant voiding issues when spiked to 14- improved on meds    Bradycardia    CAD (coronary artery disease) 05/26/2016   Coronary CTA_ 50-75% D1 off CT report    Cardiac pacemaker in situ 12/28/2009   SA node dysfunction as cause    Chest pressure 12/01/2015   Overview:  atypical CP, MCH nuclear perfusion imaging w/ no inarct, no ischemia, EF-40% NICM   Chronic systolic heart failure (HCC) 12/01/2015   Overview:  [facility] nuclear perfusion imaging study, no inarct, no ischemia, EF-40% NICM   CVA (cerebral vascular accident) (HCC)    Double vision 07/04/2018   Elevated LFTs    Erectile dysfunction 01/30/2015   cialis  prn    External hemorrhoids    Fitting or adjustment of cardiac pacemaker 06/07/2019   Overview:  Biotronik [place] DR [id number], RA and RV leads [id number]:  [id number] and [id number]   Former smoker 01/30/2015   Former smoker. 20 pack years quit 92. Patient declined AAA screen.     GERD (gastroesophageal reflux disease)    Gout 11/11/2009   Qualifier: Diagnosis of  By: Gardenia Pao     History of nuclear stress test 06/07/2019   Overview:  MCH, no inarct, no ischemia, EF-40% NICM   HTN (hypertension)    Hyperglycemia 06/10/2017   110s CBG 2018   Hyperlipidemia 07/12/2007   Crestor  20mg  daily with some myalgias; fish oil. Trig 200-500 despite this. LDL ok.        Insomnia 01/30/2015   ambien  5mg  prn. May refill.     Ischemic optic neuropathy    Left chest pressure 03/19/2015   Other and unspecified hyperlipidemia    Sinoatrial node dysfunction (HCC)    Spinal stenosis    Trigger finger, right middle finger 03/01/2018    Past Surgical History:  Procedure Laterality Date   ABSCESS DRAINAGE     righ tposterior neck   CARDIAC CATHETERIZATION  1996   CHOLECYSTECTOMY  02/07/2003   COLONOSCOPY  06/23/2004   EYE SURGERY      left eye   left ingunial hernia  06/08/2011   PACEMAKER INSERTION     right hip replacement     2012 Dr. Hiram    Family History  Problem Relation Age of Onset   Lung cancer Mother        mets to brain   Heart attack Mother 60   Hypertension Mother    Brain cancer Mother    Heart attack Father        mid 2s, smoker.    Diabetes Father    Cancer Brother    Diabetes Paternal Grandfather    Colon cancer Neg Hx    Esophageal cancer Neg Hx    Stomach cancer Neg Hx    Rectal cancer Neg Hx     Social History   Tobacco Use   Smoking status: Former    Current packs/day: 0.00    Average packs/day: 1 pack/day for 20.0 years (20.0 ttl pk-yrs)    Types: Cigarettes    Start date: 11/04/1971    Quit date: 11/04/1991    Years since quitting: 32.6   Smokeless tobacco: Never  Vaping Use   Vaping status: Never Used  Substance Use Topics   Alcohol use: Yes    Alcohol/week: 0.0 standard drinks of alcohol    Comment: occ   Drug use: No     No Known Allergies  Review of Systems NEGATIVE UNLESS OTHERWISE INDICATED IN HPI      Objective:     BP 120/78 (BP Location: Left Arm, Patient Position: Sitting, Cuff Size: Normal)   Pulse 82   Temp 98.2 F (36.8 C) (Temporal)   Ht 5' 8 (1.727 m)   Wt 170 lb 12.8 oz (77.5 kg)   SpO2 92%   BMI 25.97 kg/m   Wt Readings from Last 3 Encounters:  06/29/24 170 lb 12.8 oz (77.5 kg)  06/01/24 170 lb 12.8 oz (77.5 kg)  05/18/24 176 lb 6.4 oz (80 kg)    BP Readings from Last 3 Encounters:  06/29/24 120/78  06/01/24 112/60  05/18/24 (!) 140/70     Physical Exam Vitals and nursing note reviewed.  Constitutional:      Appearance: Normal appearance.  Eyes:     Extraocular Movements: Extraocular movements intact.     Conjunctiva/sclera: Conjunctivae normal.     Pupils: Pupils are equal, round, and reactive to light.  Cardiovascular:     Rate and Rhythm: Normal rate.     Pulses: Normal pulses.  Pulmonary:     Effort: Pulmonary  effort is normal.     Breath sounds: Normal breath sounds.  Abdominal:     General: Abdomen is  flat. Bowel sounds are normal.     Palpations: Abdomen is soft. There is no mass.     Tenderness: There is no abdominal tenderness. There is no right CVA tenderness, left CVA tenderness or guarding.  Skin:    Findings: Lesion (R medial ankle small scab without surrounding edema or erythema - see photo) present.  Neurological:     Mental Status: He is alert.             Arella Blinder M Natasja Niday, PA-C

## 2024-06-30 NOTE — Therapy (Signed)
 OUTPATIENT PHYSICAL THERAPY TREATMENT NOTE   Patient Name: [name] MRN: [id number] DOB:05/10/1946, 78 y.o., male Today's Date: 07/03/2024  END OF SESSION:  PT End of Session - 07/03/24 1235     Visit Number 6    Date for PT Re-Evaluation 07/21/24    Authorization Type [employer] 05/26/2024-07/21/2024-auth#[id number]    Authorization - Visit Number 6    Authorization - Number of Visits 16    Progress Note Due on Visit 10    PT Start Time 1233    PT Stop Time 1316    PT Time Calculation (min) 43 min    Activity Tolerance Patient tolerated treatment well    Behavior During Therapy [facility] for tasks assessed/performed             Past Medical History:  Diagnosis Date   Abscess    right posterior neck   Anticoagulation adequate 06/07/2019   Overview:  SSS, paroxAFib/Flutter, CHADSvasc= 3 age, h/o cva; chronic OAC Xarelto    Atrial fibrillation and flutter (HCC) 01/01/2015   Noted 2016. Xarelto     BPH associated with nocturia 08/14/2014   Finasteride , flomax .  0-3x nocturia. PSA to 14-->eval urology [facility]. PSA trended back down to about 2.6 per records in 2015 then placed on finasteride - had significant voiding issues when spiked to 14- improved on meds    Bradycardia    CAD (coronary artery disease) 05/26/2016   Coronary CTA_ 50-75% D1 off CT report    Cardiac pacemaker in situ 12/28/2009   SA node dysfunction as cause    Chest pressure 12/01/2015   Overview:  atypical CP, [facility] nuclear perfusion imaging w/ no inarct, no ischemia, EF-40% NICM   Chronic systolic heart failure (HCC) 12/01/2015   Overview:  [facility] nuclear perfusion imaging study, no inarct, no ischemia, EF-40% NICM   CVA (cerebral vascular accident) (HCC)    Double vision 07/04/2018   Elevated LFTs    Erectile dysfunction 01/30/2015   cialis  prn    External hemorrhoids    Fitting or adjustment of cardiac pacemaker 06/07/2019   Overview:  [place] DR [id number], RA and RV leads [id number]:  [id number] and [id number]    Former smoker 01/30/2015   Former smoker. 20 pack years quit 92. Patient declined AAA screen.     GERD (gastroesophageal reflux disease)    Gout 11/11/2009   Qualifier: Diagnosis of  By: Gardenia Pao     History of nuclear stress test 06/07/2019   Overview:  [facility], no inarct, no ischemia, EF-40% NICM   HTN (hypertension)    Hyperglycemia 06/10/2017   110s CBG 2018   Hyperlipidemia 07/12/2007   Crestor  20mg  daily with some myalgias; fish oil. Trig 200-500 despite this. LDL ok.        Insomnia 01/30/2015   ambien  5mg  prn. May refill.     Ischemic optic neuropathy    Left chest pressure 03/19/2015   Other and unspecified hyperlipidemia    Sinoatrial node dysfunction (HCC)    Spinal stenosis    Trigger finger, right middle finger 03/01/2018   Past Surgical History:  Procedure Laterality Date   ABSCESS DRAINAGE     righ tposterior neck   CARDIAC CATHETERIZATION  1996   CHOLECYSTECTOMY  02/07/2003   COLONOSCOPY  06/23/2004   EYE SURGERY     left eye   left ingunial hernia  06/08/2011   PACEMAKER INSERTION     right hip replacement     2012 Dr. Hiram   Patient Active Problem List  Diagnosis Date Noted   Aortic atherosclerosis (HCC) 08/01/2020   Fatty liver 08/01/2020   Paroxysmal atrial fibrillation (HCC) 07/27/2019   Anticoagulation adequate 06/07/2019   Convergence insufficiency 06/07/2019   Fitting or adjustment of cardiac pacemaker 06/07/2019   Pacemaker-dependent due to native cardiac rhythm insufficient to support life 06/07/2019   Palpitations 06/07/2019   History of nuclear stress test 06/07/2019   S/P cardiac cath 06/07/2019   Non-occlusive coronary artery disease 06/07/2019   Double vision 07/04/2018   Trigger finger, right middle finger 03/01/2018   Hyperglycemia 06/10/2017   Solitary pulmonary nodule 05/26/2016   CAD (coronary artery disease) 05/26/2016   Nonischemic cardiomyopathy (HCC) 04/23/2016   Chronic systolic heart failure (HCC) 12/01/2015   Chest  pressure 12/01/2015   Left chest pressure 03/19/2015   Insomnia 01/30/2015   Erectile dysfunction 01/30/2015   Cerebral infarction (HCC) 01/30/2015   Former smoker 01/30/2015   Atrial fibrillation and flutter (HCC) 01/01/2015   Allergic rhinitis 10/11/2014   BPH associated with nocturia 08/14/2014   Ventricular tachycardia, non-sustained (HCC) 01/25/2013   Sinoatrial node dysfunction (HCC)    SPINAL STENOSIS, LUMBAR 08/22/2010   GERD 03/17/2010   Cardiac pacemaker in situ 12/28/2009   Gout 11/11/2009   Hyperlipidemia 07/12/2007   NEUROPATHY, ISCHEMIC OPTIC 06/02/2007   Essential hypertension 06/02/2007    PCP: [name]., MD  REFERRING PROVIDER: [name], DO  REFERRING DIAG: M54.42,M54.41,G89.29 (ICD-10-CM) - Chronic bilateral low back pain with bilateral sciatica M51.362 (ICD-10-CM) - Degeneration of intervertebral disc of lumbar region with discogenic back pain and lower extremity pain  Rationale for Evaluation and Treatment: Rehabilitation  THERAPY DIAG:  Other low back pain  Other abnormalities of gait and mobility  Difficulty in walking, not elsewhere classified  Muscle weakness (generalized)  ONSET DATE: February 2024 first visit to Dr Leonce  SUBJECTIVE:                                                                                                                                                                                           SUBJECTIVE STATEMENT: We need to take it easy today. I have a cold.  PERTINENT HISTORY:  [name] (next upcoming trip 7/14 for a [employer] cruise) OA, trigger fingers, right hip arthroplasty, pacemaker  PAIN:  Are you having pain? Yes: NPRS scale: 0-1/10 Pain location: low back Pain description: discomfort Aggravating factors: worst first thing in the morning or after lying on his couch, swimming Relieving factors: medication, movement  PRECAUTIONS: ICD/Pacemaker  RED FLAGS: None   WEIGHT  BEARING RESTRICTIONS: No  FALLS:  Has patient fallen in last 6 months? Yes. Number of falls  1 fall.  Tripped over a chair while traveling at [place]  LIVING ENVIRONMENT: Lives with: lives with their spouse Lives in: House/apartment Stairs: one story Has following equipment at home: None  OCCUPATION: Retired  PLOF: Independent and Leisure: traveling (many upcoming trips planned), swimming  PATIENT GOALS: To feel better and not have to be reliant on pain medication as much and improve balance and flexibility.  NEXT MD VISIT: Follow up with Dr Leonce as needed  OBJECTIVE:  Note: Objective measures were completed at Evaluation unless otherwise noted.  DIAGNOSTIC FINDINGS:  Lumbar CT Scan on 02/04/2023: IMPRESSION: Multilevel degenerative changes which are most severe at the L3-4 level with significant spinal stenosis. No acute traumatic abnormalities identified.  PATIENT SURVEYS:  Modified Oswestry:  MODIFIED OSWESTRY DISABILITY SCALE   Date: 05/26/2024 Score  Pain intensity 2 =  Pain medication provides me with complete relief from pain.  2. Personal care (washing, dressing, etc.) 0 =  I can take care of myself normally without causing increased pain.  3. Lifting 2 = Pain prevents me from lifting heavy weights off the floor, activities (eg. sports, dancing). but I can manage if the weights are conveniently positioned (3) Pain prevents me from going out very often. (eg, on a table).  4. Walking 0 = Pain does not prevent me from walking any distance  5. Sitting 0 =  I can sit in any chair as long as I like.  6. Standing 1 =  I can stand as long as I want but, it increases my pain.  7. Sleeping 1 = I can sleep well only by using pain medication.  8. Social Life 0 = My social life is normal and does not increase my pain.  9. Traveling 2 =  My pain restricts my travel over 2 hours.  10. Employment/ Homemaking 1 = My normal homemaking/job activities increase my pain, but I can still  perform all that is required of me  Total 9 / 50 = 18.0 %   Interpretation of scores: Score Category Description  0-20% Minimal Disability The patient can cope with most living activities. Usually no treatment is indicated apart from advice on lifting, sitting and exercise  21-40% Moderate Disability The patient experiences more pain and difficulty with sitting, lifting and standing. Travel and social life are more difficult and they may be disabled from work. Personal care, sexual activity and sleeping are not grossly affected, and the patient can usually be managed by conservative means  41-60% Severe Disability Pain remains the main problem in this group, but activities of daily living are affected. These patients require a detailed investigation  61-80% Crippled Back pain impinges on all aspects of the patient's life. Positive intervention is required  81-100% Bed-bound  These patients are either bed-bound or exaggerating their symptoms  Bluford FORBES Zoe DELENA Karon DELENA, et al. Surgery versus conservative management of stable thoracolumbar fracture: the PRESTO feasibility RCT. [place]): [employer]; 2021 Nov. [facility] Technology Assessment, No. 25.62.) Appendix 3, Oswestry Disability Index category descriptors. Available from: FindJewelers.cz  Minimally Clinically Important Difference (MCID) = 12.8%  COGNITION: Overall cognitive status: Within functional limits for tasks assessed     SENSATION: Patient states that he has a neuropathy problem in his feet, but it has improved since starting Gabapentin   MUSCLE LENGTH: Hamstrings: increased tightness bilaterally Piriformis:  tightness bilat  POSTURE: rounded shoulders and forward head   LUMBAR ROM:   Eval:  Decreased by approx 50%  LOWER EXTREMITY  ROM:     WFL  LOWER EXTREMITY MMT:    Eval:   LE grossly WNL, but grossly 4+ to 5-/5 in right hip Core strength grossly 4-/5  throughout  LUMBAR SPECIAL TESTS:  Slump test: Negative  FUNCTIONAL TESTS:  Eval: 5 times sit to stand: 11.87 sec Single Leg Stance:  right- 1.53 sec, left- 1.40 sec  GAIT: Distance walked: >500 ft Assistive device utilized: None Level of assistance: Complete Independence Comments: States that he has more difficulty with balance on unlevel surfaces (like grass) than firm surfaces.  TREATMENT DATE:  07/03/2024: Nustep level 5 x5 min with PT present to discuss status Seated hamstring stretch 3x20 sec bilat Seated piriformis stretch 2x20 sec bilat Sit to/from stand holding 5# kettlebell:  x 10  Gait: intermittent LOB, increased BOS Tandem walking - challenging Tandem stance  - multiple reps  Split stance on 8 inch step balance SLS Side stepping yellow loop at barre Monster walking with yellow loop at barre Seated clam yellow x 20 (increase resistance)  06/09/2024: Nustep level 5 x5 min with PT present to discuss status Seated hamstring stretch 2x20 sec bilat Seated piriformis stretch 2x20 sec bilat Sit to/from stand holding 5# kettlebell:  x 10 with chest press, x 10 with overhead press Seated modified Russian twist with 5# kettle bell 2x10 Standing shoulder rows 10# and 15# 1x10 ea Standing B shouder ext green x 20 in split stance Resisted walking 10# 4 way (CGA with left direction) Standing single leg clock tap with UE support x10 bilat Hurdle step overs 2x10 B challenging on R Standing B counter stretch 2x10 sec ea  Stair taps 1x10     06/07/2024: Nustep level 5 x5 min with PT present to discuss status Seated hamstring stretch 2x20 sec bilat Seated piriformis stretch 2x20 sec bilat Sit to/from stand holding 5# kettlebell:  x 10 with chest press, x 10 with overhead press Seated modified Russian twist with 5# kettle bell 2x10 Seated blue pball rollout x10 and bil x 10 ea Seated with good upright posture shoulder horizontal abduction with green tband 1x10, blue 1x10   Standing shoulder ER with green tband 2x10 Standing shoulder rows with green tband 2x10 try Matrix next Standing single leg clock tap with UE support x10 bilat Standing B counter stretch 2x10 sec ea  Tandem gait at barre down and back x3 laps Tandem stance multiple reps Stair taps 2x10  Hip hikes x 10 B  PATIENT EDUCATION:  Education details: Issued HEP Person educated: Patient Education method: Explanation, Facilities manager, and Handouts Education comprehension: verbalized understanding and returned demonstration  HOME EXERCISE PROGRAM: Access Code: [facility] URL: [email or web address] Date: 06/09/2024 Prepared by: Mliss  Exercises - Seated Transversus Abdominis Bracing  - 1 x daily - 7 x weekly - 2 sets - 10 reps - Seated Hamstring Stretch  - 1 x daily - 7 x weekly - 2 reps - 20 sec hold - Seated Piriformis Stretch  - 1 x daily - 7 x weekly - 2 reps - 20 sec hold - Single Leg Stance with Support  - 1 x daily - 7 x weekly - 2 reps - 20 sec hold - Shoulder External Rotation and Scapular Retraction with Resistance  - 1 x daily - 7 x weekly - 2 sets - 10 reps - Standing Shoulder Horizontal Abduction with Resistance  - 1 x daily - 7 x weekly - 2 sets - 10 reps - Standing Shoulder Row with Anchored Resistance  - 1 x  daily - 7 x weekly - 2 sets - 10 reps - Shoulder extension with resistance - Neutral  - 1 x daily - 7 x weekly - 2 sets - 10 reps - Single Leg Balance with Clock Reach  - 1 x daily - 7 x weekly - 10 reps - Standing 'L' Stretch at Counter  - 1 x daily - 7 x weekly - 2 reps - 20 sec hold - Resisted Sit-to-Stand With Dumbbell at Chest  - 1 x daily - 3 x weekly - 2 sets - 10 reps - Side Step Overs with Cones and Unilateral Counter Support  - 1 x daily - 3 x weekly - 2 sets - 10 reps  ASSESSMENT:  CLINICAL IMPRESSION: Patient returns today from vacation where he reports he was non-compliant with HEP. He reports he has a cold as well and wants to take it easy.  Patient continues to demonstrate unsteady gait pattern, and difficulty with narrow BOS and SLS activities. He reports he had increased back pain after sit to stands with [facility] press and chest press last visit so we held these. Focused on glute med strength today. [place] going [place] two [place]. Then another cruise after 1.5 weeks. Plan to consolidate HEP to sidestepping, monster walk and SLS in hopes that he will be more compliant with his exercises when out of town. He was able to stand on his L leg for 3 sec today, but R is still about 1 sec.    OBJECTIVE IMPAIRMENTS: decreased balance, decreased strength, increased muscle spasms, impaired flexibility, postural dysfunction, and pain.   ACTIVITY LIMITATIONS: lifting, standing, and stairs  PARTICIPATION LIMITATIONS: driving, community activity, and yard work  PERSONAL FACTORS: Time since onset of injury/illness/exacerbation and 1-2 comorbidities: OA, right hip arthroplasty are also affecting patient's functional outcome.   REHAB POTENTIAL: Good  CLINICAL DECISION MAKING: Stable/uncomplicated  EVALUATION COMPLEXITY: Low   GOALS: Goals reviewed with patient? Yes  SHORT TERM GOALS: Target date: 06/16/2024  Patient will be independent with initial HEP. Baseline: Goal status: Met on 05/31/24  2.  Patient will demonstrate and verbalize knowledge of proper lifting techniques to allow him to lift suitcases for travel. Baseline:  Goal status: Ongoing   LONG TERM GOALS: Target date: 07/21/2024  Patient will be independent with advanced HEP to allow for self progression after discharge. Baseline:  Goal status: INITIAL  2.  Patient will improve modified Oswestry to not greater than 5% to demonstrate improved functional mobility. Baseline: 18% Goal status: INITIAL  3.  Patient will increase right hip strength to [facility] to allow him to navigate stairs with reciprocal pattern without difficulty/discomfort. Baseline:  Goal status: INITIAL  4.   Patient will increase bilateral single leg stance to greater than 10 seconds to decrease patients fall risk. Baseline: right- 1.53 sec, left- 1.40 sec Goal status: IN PROGRESS 07/03/24  5.  Patient will report ability to ambulate over various unlevel surfaces for desired length (at least greater than 30 minutes) without a loss of balance to allow patient to travel and sight-see. Baseline:  Goal status: INITIAL   PLAN:  PT FREQUENCY: 1-2x/week  PT DURATION: 8 weeks  PLANNED INTERVENTIONS: [id number]- PT Re-evaluation, 97750- Physical Performance Testing, 97110-Therapeutic exercises, 97530- Therapeutic activity, [id number]- Neuromuscular re-education, 97535- Self Care, [id number]- Manual therapy, [id number]- Gait training, [phone]- Canalith repositioning, [id number]- Aquatic Therapy, [id number]- Electrical stimulation (unattended), [id number]- Electrical stimulation (manual), [id number]- Ultrasound, [id number]- Traction (mechanical), [id number]- Ionotophoresis 4mg /ml Dexamethasone , 79439 (1-2 muscles), [id number] (3+ muscles)-  Dry Needling, Patient/Family education, Balance training, Stair training, Taping, Joint mobilization, Joint manipulation, Spinal manipulation, Spinal mobilization, Cryotherapy, and Moist heat.  PLAN FOR NEXT SESSION: ODI, check stairs, farmer carry and lifting for suitcase, continue with side step, monster walk and balance   Mliss Cummins, PT  07/03/24, 1:29 PM  [facility] [address] [place], [place] [address] Phone # [phone] Fax [phone]

## 2024-07-03 ENCOUNTER — Ambulatory Visit: Attending: Sports Medicine | Admitting: Physical Therapy

## 2024-07-03 ENCOUNTER — Encounter: Payer: Self-pay | Admitting: Physical Therapy

## 2024-07-03 DIAGNOSIS — R252 Cramp and spasm: Secondary | ICD-10-CM | POA: Diagnosis not present

## 2024-07-03 DIAGNOSIS — R262 Difficulty in walking, not elsewhere classified: Secondary | ICD-10-CM | POA: Diagnosis not present

## 2024-07-03 DIAGNOSIS — R2689 Other abnormalities of gait and mobility: Secondary | ICD-10-CM | POA: Insufficient documentation

## 2024-07-03 DIAGNOSIS — M5459 Other low back pain: Secondary | ICD-10-CM | POA: Diagnosis not present

## 2024-07-03 DIAGNOSIS — M6281 Muscle weakness (generalized): Secondary | ICD-10-CM | POA: Diagnosis not present

## 2024-07-03 DIAGNOSIS — R293 Abnormal posture: Secondary | ICD-10-CM | POA: Diagnosis not present

## 2024-07-04 NOTE — Therapy (Signed)
 OUTPATIENT PHYSICAL THERAPY TREATMENT NOTE   Patient Name: [name] MRN: [id number] DOB:01/05/1946, 78 y.o., male Today's Date: 07/05/2024  END OF SESSION:  PT End of Session - 07/05/24 1234     Visit Number 7    Authorization Type [employer] 05/26/2024-07/21/2024-auth#[id number]    Authorization - Visit Number 7    Authorization - Number of Visits 16    Progress Note Due on Visit 10    PT Start Time 1234    PT Stop Time 1319    PT Time Calculation (min) 45 min    Activity Tolerance Patient tolerated treatment well    Behavior During Therapy [facility] for tasks assessed/performed              Past Medical History:  Diagnosis Date   Abscess    right posterior neck   Anticoagulation adequate 06/07/2019   Overview:  SSS, paroxAFib/Flutter, CHADSvasc= 3 age, h/o cva; chronic OAC Xarelto    Atrial fibrillation and flutter (HCC) 01/01/2015   Noted 2016. Xarelto     BPH associated with nocturia 08/14/2014   Finasteride , flomax .  0-3x nocturia. PSA to 14-->eval urology [facility]. PSA trended back down to about 2.6 per records in 2015 then placed on finasteride - had significant voiding issues when spiked to 14- improved on meds    Bradycardia    CAD (coronary artery disease) 05/26/2016   Coronary CTA_ 50-75% D1 off CT report    Cardiac pacemaker in situ 12/28/2009   SA node dysfunction as cause    Chest pressure 12/01/2015   Overview:  atypical CP, [facility] nuclear perfusion imaging w/ no inarct, no ischemia, EF-40% NICM   Chronic systolic heart failure (HCC) 12/01/2015   Overview:  [facility] nuclear perfusion imaging study, no inarct, no ischemia, EF-40% NICM   CVA (cerebral vascular accident) (HCC)    Double vision 07/04/2018   Elevated LFTs    Erectile dysfunction 01/30/2015   cialis  prn    External hemorrhoids    Fitting or adjustment of cardiac pacemaker 06/07/2019   Overview:  [place] DR [id number], RA and RV leads [id number]:  [id number] and [id number]   Former smoker 01/30/2015   Former  smoker. 20 pack years quit 92. Patient declined AAA screen.     GERD (gastroesophageal reflux disease)    Gout 11/11/2009   Qualifier: Diagnosis of  By: Gardenia Pao     History of nuclear stress test 06/07/2019   Overview:  [facility], no inarct, no ischemia, EF-40% NICM   HTN (hypertension)    Hyperglycemia 06/10/2017   110s CBG 2018   Hyperlipidemia 07/12/2007   Crestor  20mg  daily with some myalgias; fish oil. Trig 200-500 despite this. LDL ok.        Insomnia 01/30/2015   ambien  5mg  prn. May refill.     Ischemic optic neuropathy    Left chest pressure 03/19/2015   Other and unspecified hyperlipidemia    Sinoatrial node dysfunction (HCC)    Spinal stenosis    Trigger finger, right middle finger 03/01/2018   Past Surgical History:  Procedure Laterality Date   ABSCESS DRAINAGE     righ tposterior neck   CARDIAC CATHETERIZATION  1996   CHOLECYSTECTOMY  02/07/2003   COLONOSCOPY  06/23/2004   EYE SURGERY     left eye   left ingunial hernia  06/08/2011   PACEMAKER INSERTION     right hip replacement     2012 Dr. Hiram   Patient Active Problem List   Diagnosis Date Noted  Aortic atherosclerosis (HCC) 08/01/2020   Fatty liver 08/01/2020   Paroxysmal atrial fibrillation (HCC) 07/27/2019   Anticoagulation adequate 06/07/2019   Convergence insufficiency 06/07/2019   Fitting or adjustment of cardiac pacemaker 06/07/2019   Pacemaker-dependent due to native cardiac rhythm insufficient to support life 06/07/2019   Palpitations 06/07/2019   History of nuclear stress test 06/07/2019   S/P cardiac cath 06/07/2019   Non-occlusive coronary artery disease 06/07/2019   Double vision 07/04/2018   Trigger finger, right middle finger 03/01/2018   Hyperglycemia 06/10/2017   Solitary pulmonary nodule 05/26/2016   CAD (coronary artery disease) 05/26/2016   Nonischemic cardiomyopathy (HCC) 04/23/2016   Chronic systolic heart failure (HCC) 12/01/2015   Chest pressure 12/01/2015   Left chest  pressure 03/19/2015   Insomnia 01/30/2015   Erectile dysfunction 01/30/2015   Cerebral infarction (HCC) 01/30/2015   Former smoker 01/30/2015   Atrial fibrillation and flutter (HCC) 01/01/2015   Allergic rhinitis 10/11/2014   BPH associated with nocturia 08/14/2014   Ventricular tachycardia, non-sustained (HCC) 01/25/2013   Sinoatrial node dysfunction (HCC)    SPINAL STENOSIS, LUMBAR 08/22/2010   GERD 03/17/2010   Cardiac pacemaker in situ 12/28/2009   Gout 11/11/2009   Hyperlipidemia 07/12/2007   NEUROPATHY, ISCHEMIC OPTIC 06/02/2007   Essential hypertension 06/02/2007    PCP: Nathan Garnette KIDD., MD  REFERRING PROVIDER: [name], DO  REFERRING DIAG: M54.42,M54.41,G89.29 (ICD-10-CM) - Chronic bilateral low back pain with bilateral sciatica M51.362 (ICD-10-CM) - Degeneration of intervertebral disc of lumbar region with discogenic back pain and lower extremity pain  Rationale for Evaluation and Treatment: Rehabilitation  THERAPY DIAG:  Other low back pain  Other abnormalities of gait and mobility  Difficulty in walking, not elsewhere classified  Muscle weakness (generalized)  ONSET DATE: February 2024 first visit to Dr Nathan  SUBJECTIVE:                                                                                                                                                                                           SUBJECTIVE STATEMENT: I'm doing pretty good.  PERTINENT HISTORY:  [name] frequently (next upcoming trip 7/14 for a [place] cruise) OA, trigger fingers, right hip arthroplasty, pacemaker  PAIN:  Are you having pain? Yes: NPRS scale: 0-1/10 Pain location: low back Pain description: discomfort Aggravating factors: worst first thing in the morning or after lying on his couch, swimming Relieving factors: medication, movement  PRECAUTIONS: ICD/Pacemaker  RED FLAGS: None   WEIGHT BEARING RESTRICTIONS: No  FALLS:  Has patient fallen in  last 6 months? Yes. Number of falls 1 fall.  Tripped over a chair while traveling at [place]  LIVING ENVIRONMENT: Lives with: lives with their spouse Lives in: House/apartment Stairs: one story Has following equipment at home: None  OCCUPATION: Retired  PLOF: Independent and Leisure: traveling (many upcoming trips planned), swimming  PATIENT GOALS: To feel better and not have to be reliant on pain medication as much and improve balance and flexibility.  NEXT MD VISIT: Follow up with Dr Nathan as needed  OBJECTIVE:  Note: Objective measures were completed at Evaluation unless otherwise noted.  DIAGNOSTIC FINDINGS:  Lumbar CT Scan on 02/04/2023: IMPRESSION: Multilevel degenerative changes which are most severe at the L3-4 level with significant spinal stenosis. No acute traumatic abnormalities identified.  PATIENT SURVEYS:  Modified Oswestry:  MODIFIED OSWESTRY DISABILITY SCALE   Date: 05/26/2024 Score  Pain intensity 2 =  Pain medication provides me with complete relief from pain.  2. Personal care (washing, dressing, etc.) 0 =  I can take care of myself normally without causing increased pain.  3. Lifting 2 = Pain prevents me from lifting heavy weights off the floor, activities (eg. sports, dancing). but I can manage if the weights are conveniently positioned (3) Pain prevents me from going out very often. (eg, on a table).  4. Walking 0 = Pain does not prevent me from walking any distance  5. Sitting 0 =  I can sit in any chair as long as I like.  6. Standing 1 =  I can stand as long as I want but, it increases my pain.  7. Sleeping 1 = I can sleep well only by using pain medication.  8. Social Life 0 = My social life is normal and does not increase my pain.  9. Traveling 2 =  My pain restricts my travel over 2 hours.  10. Employment/ Homemaking 1 = My normal homemaking/job activities increase my pain, but I can still perform all that is required of me  Total 9 / 50 = 18.0 %    Interpretation of scores: Score Category Description  0-20% Minimal Disability The patient can cope with most living activities. Usually no treatment is indicated apart from advice on lifting, sitting and exercise  21-40% Moderate Disability The patient experiences more pain and difficulty with sitting, lifting and standing. Travel and social life are more difficult and they may be disabled from work. Personal care, sexual activity and sleeping are not grossly affected, and the patient can usually be managed by conservative means  41-60% Severe Disability Pain remains the main problem in this group, but activities of daily living are affected. These patients require a detailed investigation  61-80% Crippled Back pain impinges on all aspects of the patient's life. Positive intervention is required  81-100% Bed-bound  These patients are either bed-bound or exaggerating their symptoms  Bluford FORBES Zoe DELENA Karon DELENA, et al. Surgery versus conservative management of stable thoracolumbar fracture: the PRESTO feasibility RCT. [place] ([place]): [employer]; 2021 Nov. [facility] Technology Assessment, No. 25.62.) Appendix 3, Oswestry Disability Index category descriptors. Available from: FindJewelers.cz  Minimally Clinically Important Difference (MCID) = 12.8%   8/6/2 ODI 4/50 = 8%  COGNITION: Overall cognitive status: Within functional limits for tasks assessed     SENSATION: Patient states that he has a neuropathy problem in his feet, but it has improved since starting Gabapentin   MUSCLE LENGTH: Hamstrings: increased tightness bilaterally Piriformis:  tightness bilat  POSTURE: rounded shoulders and forward head   LUMBAR ROM:   Eval:  Decreased by approx 50%  LOWER EXTREMITY ROM:  WFL  LOWER EXTREMITY MMT:    Eval:   LE grossly WNL, but grossly 4+ to 5-/5 in right hip Core strength grossly 4-/5 throughout  LUMBAR SPECIAL TESTS:  Slump  test: Negative  FUNCTIONAL TESTS:  Eval: 5 times sit to stand: 11.87 sec Single Leg Stance:  right- 1.53 sec, left- 1.40 sec  GAIT: Distance walked: >500 ft Assistive device utilized: None Level of assistance: Complete Independence Comments: States that he has more difficulty with balance on unlevel surfaces (like grass) than firm surfaces.  TREATMENT DATE:   07/05/2024: ODI, Assessed stairs Aura carry  with 20# KB x 2 laps ea hand length of hallway Side stepping yellow loop at [employer] walking with yellow loop at barre  3 way step yellow loop 2 x 5 B  Seated clam red x 20 not too challenging  S/L Red clam x 30 - not very challenging Sit to stand holding 5# kettlebell:  x 10  Squat to mat + 2 foam 2x10 Tandem walking - challenging Tandem stance  - multiple reps    07/03/2024: Nustep level 5 x5 min with PT present to discuss status Seated hamstring stretch 3x20 sec bilat Seated piriformis stretch 2x20 sec bilat Sit to/from stand holding 5# kettlebell:  x 10  Gait: intermittent LOB, increased BOS Tandem walking - challenging Tandem stance  - multiple reps  Split stance on 8 inch step balance SLS Side stepping yellow loop at barre Monster walking with yellow loop at barre Seated clam yellow x 20 (increase resistance)  06/09/2024: Nustep level 5 x5 min with PT present to discuss status Seated hamstring stretch 2x20 sec bilat Seated piriformis stretch 2x20 sec bilat Sit to/from stand holding 5# kettlebell:  x 10 with chest press, x 10 with overhead press Seated modified Russian twist with 5# kettle bell 2x10 Standing shoulder rows 10# and 15# 1x10 ea Standing B shouder ext green x 20 in split stance Resisted walking 10# 4 way (CGA with left direction) Standing single leg clock tap with UE support x10 bilat Hurdle step overs 2x10 B challenging on R Standing B counter stretch 2x10 sec ea  Stair taps 1x10     06/07/2024: Nustep level 5 x5 min with PT present to  discuss status Seated hamstring stretch 2x20 sec bilat Seated piriformis stretch 2x20 sec bilat Sit to/from stand holding 5# kettlebell:  x 10 with chest press, x 10 with overhead press Seated modified Russian twist with 5# kettle bell 2x10 Seated blue pball rollout x10 and bil x 10 ea Seated with good upright posture shoulder horizontal abduction with green tband 1x10, blue 1x10  Standing shoulder ER with green tband 2x10 Standing shoulder rows with green tband 2x10 try Matrix next Standing single leg clock tap with UE support x10 bilat Standing B counter stretch 2x10 sec ea  Tandem gait at barre down and back x3 laps Tandem stance multiple reps Stair taps 2x10  Hip hikes x 10 B  PATIENT EDUCATION:  Education details: Issued HEP Person educated: Patient Education method: Explanation, Facilities manager, and Handouts Education comprehension: verbalized understanding and returned demonstration  HOME EXERCISE PROGRAM: Access Code: [facility] URL: [email or web address] Date: 07/05/2024 Prepared by: Mliss  Exercises - Seated Transversus Abdominis Bracing  - 1 x daily - 7 x weekly - 2 sets - 10 reps - Seated Hamstring Stretch  - 1 x daily - 7 x weekly - 2 reps - 20 sec hold - Seated Piriformis Stretch  - 1 x daily - 7  x weekly - 2 reps - 20 sec hold - Single Leg Stance with Support  - 1 x daily - 7 x weekly - 2 reps - 20 sec hold - Shoulder External Rotation and Scapular Retraction with Resistance  - 1 x daily - 7 x weekly - 2 sets - 10 reps - Standing Shoulder Horizontal Abduction with Resistance  - 1 x daily - 7 x weekly - 2 sets - 10 reps - Standing Shoulder Row with Anchored Resistance  - 1 x daily - 7 x weekly - 2 sets - 10 reps - Shoulder extension with resistance - Neutral  - 1 x daily - 7 x weekly - 2 sets - 10 reps - Single Leg Balance with Clock Reach  - 1 x daily - 7 x weekly - 10 reps - Standing 'L' Stretch at Counter  - 1 x daily - 7 x weekly - 2 reps - 20 sec  hold - Resisted Sit-to-Stand With Dumbbell at Chest  - 1 x daily - 3 x weekly - 2 sets - 10 reps - Side Step Overs with Cones and Unilateral Counter Support  - 1 x daily - 3 x weekly - 2 sets - 10 reps - Standing 3-Way Leg Reach with Resistance at Ankles and Counter Support  - 1 x daily - 3 x weekly - 2 sets - 10 reps - Side Stepping with Resistance at Ankles  - 1 x daily - 3 x weekly - 2 sets - 10 reps - Forward Monster Walk with Resistance at Ankles and Counter Support  - 1 x daily - 3 x weekly - 2 sets - 10 reps - Squat with Chair Touch  - 1 x daily - 3 x weekly - 2 sets - 10 reps  ASSESSMENT:  CLINICAL IMPRESSION:  Patient continues to demonstrate unsteady gait pattern, and difficulty with narrow BOS and SLS activities. He has met his carry goal and is progressing functionally based on his improved ODI today.  Focused on glute med strength today. Sidestepping, monster walk, 3 way taps and functional squat added to HEP. Patient plans to focus mainly on these exercises.   OBJECTIVE IMPAIRMENTS: decreased balance, decreased strength, increased muscle spasms, impaired flexibility, postural dysfunction, and pain.   ACTIVITY LIMITATIONS: lifting, standing, and stairs  PARTICIPATION LIMITATIONS: driving, community activity, and yard work  PERSONAL FACTORS: Time since onset of injury/illness/exacerbation and 1-2 comorbidities: OA, right hip arthroplasty are also affecting patient's functional outcome.   REHAB POTENTIAL: Good  CLINICAL DECISION MAKING: Stable/uncomplicated  EVALUATION COMPLEXITY: Low   GOALS: Goals reviewed with patient? Yes  SHORT TERM GOALS: Target date: 06/16/2024  Patient will be independent with initial HEP. Baseline: Goal status: Met on 05/31/24  2.  Patient will demonstrate and verbalize knowledge of proper lifting techniques to allow him to lift suitcases for travel. Baseline:  Goal status: Ongoing   LONG TERM GOALS: Target date: 07/21/2024  Patient will be  independent with advanced HEP to allow for self progression after discharge. Baseline:  Goal status: INITIAL  2.  Patient will improve modified Oswestry to not greater than 5% to demonstrate improved functional mobility. Baseline: 18% Goal status: IN PROGRESS 07/05/24  8%   3.  Patient will increase right hip strength to [facility] to allow him to navigate stairs with reciprocal pattern without difficulty/discomfort. Baseline:  Goal status: MET 07/05/24  4.  Patient will increase bilateral single leg stance to greater than 10 seconds to decrease patients fall risk. Baseline: right-  1.53 sec, left- 1.40 sec Goal status: IN PROGRESS 07/03/24  5.  Patient will report ability to ambulate over various unlevel surfaces for desired length (at least greater than 30 minutes) without a loss of balance to allow patient to travel and sight-see. Baseline:  Goal status: INITIAL   PLAN:  PT FREQUENCY: 1-2x/week  PT DURATION: 8 weeks  PLANNED INTERVENTIONS: [id number]- PT Re-evaluation, 97750- Physical Performance Testing, 97110-Therapeutic exercises, 97530- Therapeutic activity, [id number]- Neuromuscular re-education, 97535- Self Care, [id number]- Manual therapy, [id number]- Gait training, [id number]- Canalith repositioning, [id number]- Aquatic Therapy, [phone]- Electrical stimulation (unattended), [phone]- Electrical stimulation (manual), [id number]- Ultrasound, [id number]- Traction (mechanical), [id number]- Ionotophoresis 4mg /ml Dexamethasone , 79439 (1-2 muscles), [id number] (3+ muscles)- Dry Needling, Patient/Family education, Balance training, Stair training, Taping, Joint mobilization, Joint manipulation, Spinal manipulation, Spinal mobilization, Cryotherapy, and Moist heat.  PLAN FOR NEXT SESSION: continue with side step, monster walk and balance   Mliss Cummins, PT  07/05/24, 1:46 PM  [facility] [address] [place], [place] [address] Phone # [phone] Fax [phone]

## 2024-07-05 ENCOUNTER — Ambulatory Visit: Admitting: Physical Therapy

## 2024-07-05 ENCOUNTER — Encounter: Payer: Self-pay | Admitting: Physical Therapy

## 2024-07-05 DIAGNOSIS — R293 Abnormal posture: Secondary | ICD-10-CM | POA: Diagnosis not present

## 2024-07-05 DIAGNOSIS — R2689 Other abnormalities of gait and mobility: Secondary | ICD-10-CM

## 2024-07-05 DIAGNOSIS — M6281 Muscle weakness (generalized): Secondary | ICD-10-CM

## 2024-07-05 DIAGNOSIS — R262 Difficulty in walking, not elsewhere classified: Secondary | ICD-10-CM | POA: Diagnosis not present

## 2024-07-05 DIAGNOSIS — M5459 Other low back pain: Secondary | ICD-10-CM

## 2024-07-05 DIAGNOSIS — R252 Cramp and spasm: Secondary | ICD-10-CM | POA: Diagnosis not present

## 2024-07-07 ENCOUNTER — Encounter: Payer: Self-pay | Admitting: Internal Medicine

## 2024-07-07 ENCOUNTER — Ambulatory Visit: Attending: Internal Medicine | Admitting: Internal Medicine

## 2024-07-07 VITALS — BP 116/70 | HR 60 | Ht 68.0 in | Wt 168.3 lb

## 2024-07-07 DIAGNOSIS — I480 Paroxysmal atrial fibrillation: Secondary | ICD-10-CM | POA: Diagnosis not present

## 2024-07-07 LAB — CUP PACEART INCLINIC DEVICE CHECK
Date Time Interrogation Session: 20250808104248
Implantable Lead Connection Status: 753985
Implantable Lead Connection Status: 753985
Implantable Lead Implant Date: 20031017
Implantable Lead Implant Date: 20031017
Implantable Lead Location: 753859
Implantable Lead Location: 753860
Implantable Lead Model: 5076
Implantable Lead Model: 5076
Implantable Pulse Generator Implant Date: 20120210
Pulse Gen Serial Number: 66063261

## 2024-07-07 NOTE — H&P (View-Only) (Signed)
 HPI [name] returns today for followup. He is a pleasant 78 yo man with sinus node dysfunction, s/p PPM insertion. He has non-obstructive CAD. He has dyslipidemia and is on statin therapy. In the interim, he notes he feels well and has gone back to swimming. He denies palpitations. He notes that his HR gets into the 120 range at times for no clear cut reason.  He has occaisional palpitations. He also notes that at times his heart rate will go from 140 down to 65/min. When I saw him last we turned off his rate response (CLS). On followup today he notes that the sudden HR drop has resolved. His HR will get into the 130's with exercise.  No Known Allergies   Current Outpatient Medications  Medication Sig Dispense Refill   benazepril  (LOTENSIN ) 5 MG tablet Take 1 tablet by mouth once daily 90 tablet 0   cyclobenzaprine  (FLEXERIL ) 5 MG tablet Take 1 tablet (5 mg total) by mouth as needed. for muscle spams 90 tablet 0   gabapentin  (NEURONTIN ) 400 MG capsule Take 1 capsule by mouth twice daily 180 capsule 0   gemfibrozil  (LOPID ) 600 MG tablet TAKE 1 TABLET BY MOUTH TWICE DAILY BEFORE  A  MEAL 180 tablet 3   icosapent Ethyl (VASCEPA) 1 g capsule Take 1 g by mouth 2 (two) times daily.     indomethacin  (INDOCIN ) 25 MG capsule Take 1 capsule (25 mg total) by mouth 2 (two) times daily with a meal. 60 capsule 3   metoprolol  succinate (TOPROL -XL) 25 MG 24 hr tablet Take 1 tablet (25 mg total) by mouth daily. 90 tablet 3   metoprolol  tartrate (LOPRESSOR ) 25 MG tablet Take 1 tablet (25 mg total) by mouth as needed. Take 1 tablet (25 mg total) as needed for heart rate greater than 120 45 tablet 3   Multiple Vitamin (MULTIVITAMIN) tablet Take 1 tablet by mouth daily.     omeprazole  (PRILOSEC) 20 MG capsule Take 1 capsule by mouth once daily 90 capsule 0   rosuvastatin  (CRESTOR ) 20 MG tablet Take 20 mg by mouth daily. (Patient taking differently: Take 40 mg by mouth daily.)     tadalafil  (CIALIS ) 5 MG  tablet TAKE 1 TABLET BY MOUTH DAILY AS NEEDED FOR ERECTILE DYSFUNCTION 90 tablet 3   tamsulosin  (FLOMAX ) 0.4 MG CAPS capsule Take 1 capsule by mouth once daily 90 capsule 0   XARELTO  20 MG TABS tablet TAKE 1 TABLET BY MOUTH ONCE DAILY WITH SUPPER 90 tablet 2   zolpidem  (AMBIEN ) 5 MG tablet Take 1 tablet (5 mg total) by mouth at bedtime. 90 tablet 1   No current facility-administered medications for this visit.     Past Medical History:  Diagnosis Date   Abscess    right posterior neck   Anticoagulation adequate 06/07/2019   Overview:  SSS, paroxAFib/Flutter, CHADSvasc= 3 age, h/o cva; chronic OAC Xarelto    Atrial fibrillation and flutter (HCC) 01/01/2015   Noted 2016. Xarelto     BPH associated with nocturia 08/14/2014   Finasteride , flomax .  0-3x nocturia. PSA to 14-->eval urology [facility]. PSA trended back down to about 2.6 per records in 2015 then placed on finasteride - had significant voiding issues when spiked to 14- improved on meds    Bradycardia    CAD (coronary artery disease) 05/26/2016   Coronary CTA_ 50-75% D1 off CT report    Cardiac pacemaker in situ 12/28/2009   SA node dysfunction as cause    Chest pressure  12/01/2015   Overview:  atypical CP, [facility] nuclear perfusion imaging w/ no inarct, no ischemia, EF-40% NICM   Chronic systolic heart failure (HCC) 12/01/2015   Overview:  [facility] nuclear perfusion imaging study, no inarct, no ischemia, EF-40% NICM   CVA (cerebral vascular accident) (HCC)    Double vision 07/04/2018   Elevated LFTs    Erectile dysfunction 01/30/2015   cialis  prn    External hemorrhoids    Fitting or adjustment of cardiac pacemaker 06/07/2019   Overview:  [employer] DR [id number], RA and RV leads [id number]:  [id number] and [id number]   Former smoker 01/30/2015   Former smoker. 20 pack years quit 92. Patient declined AAA screen.     GERD (gastroesophageal reflux disease)    Gout 11/11/2009   Qualifier: Diagnosis of  By: Gardenia Pao     History of nuclear stress test  06/07/2019   Overview:  [facility], no inarct, no ischemia, EF-40% NICM   HTN (hypertension)    Hyperglycemia 06/10/2017   110s CBG 2018   Hyperlipidemia 07/12/2007   Crestor  20mg  daily with some myalgias; fish oil. Trig 200-500 despite this. LDL ok.        Insomnia 01/30/2015   ambien  5mg  prn. May refill.     Ischemic optic neuropathy    Left chest pressure 03/19/2015   Other and unspecified hyperlipidemia    Sinoatrial node dysfunction (HCC)    Spinal stenosis    Trigger finger, right middle finger 03/01/2018    ROS:   All systems reviewed and negative except as noted in the HPI.   Past Surgical History:  Procedure Laterality Date   ABSCESS DRAINAGE     righ tposterior neck   CARDIAC CATHETERIZATION  1996   CHOLECYSTECTOMY  02/07/2003   COLONOSCOPY  06/23/2004   EYE SURGERY     left eye   left ingunial hernia  06/08/2011   PACEMAKER INSERTION     right hip replacement     2012 Dr. Hiram     Family History  Problem Relation Age of Onset   Lung cancer Mother        mets to brain   Heart attack Mother 18   Hypertension Mother    Brain cancer Mother    Heart attack Father        mid 72s, smoker.    Diabetes Father    Cancer Brother    Diabetes Paternal Grandfather    Colon cancer Neg Hx    Esophageal cancer Neg Hx    Stomach cancer Neg Hx    Rectal cancer Neg Hx      Social History   Socioeconomic History   Marital status: Married    Spouse name: Not on file   Number of children: 1   Years of education: 20   Highest education level: Professional school degree (e.g., MD, DDS, DVM, JD)  Occupational History   Not on file  Tobacco Use   Smoking status: Former    Current packs/day: 0.00    Average packs/day: 1 pack/day for 20.0 years (20.0 ttl pk-yrs)    Types: Cigarettes    Start date: 11/04/1971    Quit date: 11/04/1991    Years since quitting: 32.6   Smokeless tobacco: Never  Vaping Use   Vaping status: Never Used  Substance and Sexual Activity   Alcohol  use: Yes    Alcohol/week: 0.0 standard drinks of alcohol    Comment: occ   Drug use: No   Sexual activity:  Not on file  Other Topics Concern   Not on file  Social History Narrative   Married. 1 child and 1 step child. 2 grandchildren.       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      Hobbies: swimming (states not fun), travel      Lives in a single story home   Social Drivers of Health   Financial Resource Strain: Low Risk  (02/21/2024)   Overall Financial Resource Strain (CARDIA)    Difficulty of Paying Living Expenses: Not hard at all  Food Insecurity: No Food Insecurity (02/21/2024)   Hunger Vital Sign    Worried About Running Out of Food in the Last Year: Never true    Ran Out of Food in the Last Year: Never true  Transportation Needs: No Transportation Needs (02/21/2024)   PRAPARE - Administrator, Civil Service (Medical): No    Lack of Transportation (Non-Medical): No  Physical Activity: Sufficiently Active (02/21/2024)   Exercise Vital Sign    Days of Exercise per Week: 3 days    Minutes of Exercise per Session: 60 min  Stress: No Stress Concern Present (02/21/2024)   [employer] of Occupational Health - Occupational Stress Questionnaire    Feeling of Stress : Not at all  Social Connections: Unknown (02/21/2024)   Social Connection and Isolation Panel    Frequency of Communication with Friends and Family: More than three times a week    Frequency of Social Gatherings with Friends and Family: Once a week    Attends Religious Services: Patient declined    Database administrator or Organizations: Yes    Attends Engineer, structural: More than 4 times per year    Marital Status: Married  Catering manager Violence: Not At Risk (02/22/2024)   Humiliation, Afraid, Rape, and Kick questionnaire    Fear of Current or Ex-Partner: No    Emotionally Abused: No    Physically Abused: No    Sexually Abused: No     BP 116/70   Pulse 60   Ht 5' 8 (1.727 m)   Wt  168 lb 4.8 oz (76.3 kg)   SpO2 95%   BMI 25.59 kg/m   Physical Exam:  Well appearing NAD HEENT: Unremarkable Neck:  No JVD, no thyromegally Lymphatics:  No adenopathy Back:  No CVA tenderness Lungs:  Clear HEART:  Regular rate rhythm, no murmurs, no rubs, no clicks Abd:  soft, positive bowel sounds, no organomegally, no rebound, no guarding Ext:  2 plus pulses, no edema, no cyanosis, no clubbing Skin:  No rashes no nodules Neuro:  CN II through XII intact, motor grossly intact  EKG - NSR with atrial pacing  DEVICE  Normal device function.  See PaceArt for details.   Assess/Plan: Sinus node dysfunction - he is asymptomatic s/p PPM insertion. His device has reached ERI and he will undergo gen change out in the next month or two. Non-obstructive CAD - he denies anginal symptoms.  Dyslipidemia  - his dose of crestor  was increased to 40 mg daily. Afib/flutter - none since his last visit. We will follow.  Danelle Isabell Bonafede,MD

## 2024-07-07 NOTE — Patient Instructions (Addendum)
 Medication Instructions:  Your physician recommends that you continue on your current medications as directed. Please refer to the Current Medication list given to you today.  *If you need a refill on your cardiac medications before your next appointment, please call your pharmacy*  Lab Work: 30 days prior to procedure   You may go to any [facility] Location for your lab work:  [facility] - [address] ([facility]) - [address] [phone] [address]  [place] - [address]   [place]  - [address]   [place] - [address] - [address] [employer]  - [address] ([facility]   If you have labs (blood work) drawn today and your tests are completely normal, you will receive your results only by: [employer] (if you have MyChart)  If you have any lab test that is abnormal or we need to change your treatment, we will call you or send a MyChart message to review the results.  Testing/Procedures:   Follow-Up: At [facility], you and your health needs are our priority.  As part of our continuing mission to provide you with exceptional heart care, we have created designated Provider Care Teams.  These Care Teams include your primary Cardiologist (physician) and Advanced Practice Providers (APPs -  Physician Assistants and Nurse Practitioners) who all work together to provide you with the care you need, when you need it.  Your next appointment:   To be scheduled

## 2024-07-07 NOTE — Progress Notes (Addendum)
 HPI [name] returns today for followup. He is a pleasant 78 yo man with sinus node dysfunction, s/p PPM insertion. He has non-obstructive CAD. He has dyslipidemia and is on statin therapy. In the interim, he notes he feels well and has gone back to swimming. He denies palpitations. He notes that his HR gets into the 120 range at times for no clear cut reason.  He has occaisional palpitations. He also notes that at times his heart rate will go from 140 down to 65/min. When I saw him last we turned off his rate response (CLS). On followup today he notes that the sudden HR drop has resolved. His HR will get into the 130's with exercise.  No Known Allergies   Current Outpatient Medications  Medication Sig Dispense Refill   benazepril  (LOTENSIN ) 5 MG tablet Take 1 tablet by mouth once daily 90 tablet 0   cyclobenzaprine  (FLEXERIL ) 5 MG tablet Take 1 tablet (5 mg total) by mouth as needed. for muscle spams 90 tablet 0   gabapentin  (NEURONTIN ) 400 MG capsule Take 1 capsule by mouth twice daily 180 capsule 0   gemfibrozil  (LOPID ) 600 MG tablet TAKE 1 TABLET BY MOUTH TWICE DAILY BEFORE  A  MEAL 180 tablet 3   icosapent Ethyl (VASCEPA) 1 g capsule Take 1 g by mouth 2 (two) times daily.     indomethacin  (INDOCIN ) 25 MG capsule Take 1 capsule (25 mg total) by mouth 2 (two) times daily with a meal. 60 capsule 3   metoprolol  succinate (TOPROL -XL) 25 MG 24 hr tablet Take 1 tablet (25 mg total) by mouth daily. 90 tablet 3   metoprolol  tartrate (LOPRESSOR ) 25 MG tablet Take 1 tablet (25 mg total) by mouth as needed. Take 1 tablet (25 mg total) as needed for heart rate greater than 120 45 tablet 3   Multiple Vitamin (MULTIVITAMIN) tablet Take 1 tablet by mouth daily.     omeprazole  (PRILOSEC) 20 MG capsule Take 1 capsule by mouth once daily 90 capsule 0   rosuvastatin  (CRESTOR ) 20 MG tablet Take 20 mg by mouth daily. (Patient taking differently: Take 40 mg by mouth daily.)     tadalafil  (CIALIS ) 5 MG  tablet TAKE 1 TABLET BY MOUTH DAILY AS NEEDED FOR ERECTILE DYSFUNCTION 90 tablet 3   tamsulosin  (FLOMAX ) 0.4 MG CAPS capsule Take 1 capsule by mouth once daily 90 capsule 0   XARELTO  20 MG TABS tablet TAKE 1 TABLET BY MOUTH ONCE DAILY WITH SUPPER 90 tablet 2   zolpidem  (AMBIEN ) 5 MG tablet Take 1 tablet (5 mg total) by mouth at bedtime. 90 tablet 1   No current facility-administered medications for this visit.     Past Medical History:  Diagnosis Date   Abscess    right posterior neck   Anticoagulation adequate 06/07/2019   Overview:  SSS, paroxAFib/Flutter, CHADSvasc= 3 age, h/o cva; chronic OAC Xarelto    Atrial fibrillation and flutter (HCC) 01/01/2015   Noted 2016. Xarelto     BPH associated with nocturia 08/14/2014   Finasteride , flomax .  0-3x nocturia. PSA to 14-->eval urology [facility]. PSA trended back down to about 2.6 per records in 2015 then placed on finasteride - had significant voiding issues when spiked to 14- improved on meds    Bradycardia    CAD (coronary artery disease) 05/26/2016   Coronary CTA_ 50-75% D1 off CT report    Cardiac pacemaker in situ 12/28/2009   SA node dysfunction as cause    Chest pressure  12/01/2015   Overview:  atypical CP, [facility] nuclear perfusion imaging w/ no inarct, no ischemia, EF-40% NICM   Chronic systolic heart failure (HCC) 12/01/2015   Overview:  [facility] nuclear perfusion imaging study, no inarct, no ischemia, EF-40% NICM   CVA (cerebral vascular accident) (HCC)    Double vision 07/04/2018   Elevated LFTs    Erectile dysfunction 01/30/2015   cialis  prn    External hemorrhoids    Fitting or adjustment of cardiac pacemaker 06/07/2019   Overview:  [employer] DR [id number], RA and RV leads [id number]:  [id number] and [id number]   Former smoker 01/30/2015   Former smoker. 20 pack years quit 92. Patient declined AAA screen.     GERD (gastroesophageal reflux disease)    Gout 11/11/2009   Qualifier: Diagnosis of  By: Gardenia Pao     History of nuclear stress test  06/07/2019   Overview:  [facility], no inarct, no ischemia, EF-40% NICM   HTN (hypertension)    Hyperglycemia 06/10/2017   110s CBG 2018   Hyperlipidemia 07/12/2007   Crestor  20mg  daily with some myalgias; fish oil. Trig 200-500 despite this. LDL ok.        Insomnia 01/30/2015   ambien  5mg  prn. May refill.     Ischemic optic neuropathy    Left chest pressure 03/19/2015   Other and unspecified hyperlipidemia    Sinoatrial node dysfunction (HCC)    Spinal stenosis    Trigger finger, right middle finger 03/01/2018    ROS:   All systems reviewed and negative except as noted in the HPI.   Past Surgical History:  Procedure Laterality Date   ABSCESS DRAINAGE     righ tposterior neck   CARDIAC CATHETERIZATION  1996   CHOLECYSTECTOMY  02/07/2003   COLONOSCOPY  06/23/2004   EYE SURGERY     left eye   left ingunial hernia  06/08/2011   PACEMAKER INSERTION     right hip replacement     2012 Dr. Hiram     Family History  Problem Relation Age of Onset   Lung cancer Mother        mets to brain   Heart attack Mother 18   Hypertension Mother    Brain cancer Mother    Heart attack Father        mid 72s, smoker.    Diabetes Father    Cancer Brother    Diabetes Paternal Grandfather    Colon cancer Neg Hx    Esophageal cancer Neg Hx    Stomach cancer Neg Hx    Rectal cancer Neg Hx      Social History   Socioeconomic History   Marital status: Married    Spouse name: Not on file   Number of children: 1   Years of education: 20   Highest education level: Professional school degree (e.g., MD, DDS, DVM, JD)  Occupational History   Not on file  Tobacco Use   Smoking status: Former    Current packs/day: 0.00    Average packs/day: 1 pack/day for 20.0 years (20.0 ttl pk-yrs)    Types: Cigarettes    Start date: 11/04/1971    Quit date: 11/04/1991    Years since quitting: 32.6   Smokeless tobacco: Never  Vaping Use   Vaping status: Never Used  Substance and Sexual Activity   Alcohol  use: Yes    Alcohol/week: 0.0 standard drinks of alcohol    Comment: occ   Drug use: No   Sexual activity:  Not on file  Other Topics Concern   Not on file  Social History Narrative   Married. 1 child and 1 step child. 2 grandchildren.       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      Hobbies: swimming (states not fun), travel      Lives in a single story home   Social Drivers of Health   Financial Resource Strain: Low Risk  (02/21/2024)   Overall Financial Resource Strain (CARDIA)    Difficulty of Paying Living Expenses: Not hard at all  Food Insecurity: No Food Insecurity (02/21/2024)   Hunger Vital Sign    Worried About Running Out of Food in the Last Year: Never true    Ran Out of Food in the Last Year: Never true  Transportation Needs: No Transportation Needs (02/21/2024)   PRAPARE - Administrator, Civil Service (Medical): No    Lack of Transportation (Non-Medical): No  Physical Activity: Sufficiently Active (02/21/2024)   Exercise Vital Sign    Days of Exercise per Week: 3 days    Minutes of Exercise per Session: 60 min  Stress: No Stress Concern Present (02/21/2024)   [employer] of Occupational Health - Occupational Stress Questionnaire    Feeling of Stress : Not at all  Social Connections: Unknown (02/21/2024)   Social Connection and Isolation Panel    Frequency of Communication with Friends and Family: More than three times a week    Frequency of Social Gatherings with Friends and Family: Once a week    Attends Religious Services: Patient declined    Database administrator or Organizations: Yes    Attends Engineer, structural: More than 4 times per year    Marital Status: Married  Catering manager Violence: Not At Risk (02/22/2024)   Humiliation, Afraid, Rape, and Kick questionnaire    Fear of Current or Ex-Partner: No    Emotionally Abused: No    Physically Abused: No    Sexually Abused: No     BP 116/70   Pulse 60   Ht 5' 8 (1.727 m)   Wt  168 lb 4.8 oz (76.3 kg)   SpO2 95%   BMI 25.59 kg/m   Physical Exam:  Well appearing NAD HEENT: Unremarkable Neck:  No JVD, no thyromegally Lymphatics:  No adenopathy Back:  No CVA tenderness Lungs:  Clear HEART:  Regular rate rhythm, no murmurs, no rubs, no clicks Abd:  soft, positive bowel sounds, no organomegally, no rebound, no guarding Ext:  2 plus pulses, no edema, no cyanosis, no clubbing Skin:  No rashes no nodules Neuro:  CN II through XII intact, motor grossly intact  EKG - NSR with atrial pacing  DEVICE  Normal device function.  See PaceArt for details.   Assess/Plan: Sinus node dysfunction - he is asymptomatic s/p PPM insertion. His device has reached ERI and he will undergo gen change out in the next month or two. Non-obstructive CAD - he denies anginal symptoms.  Dyslipidemia  - his dose of crestor  was increased to 40 mg daily. Afib/flutter - none since his last visit. We will follow.  Danelle Isabell Bonafede,MD

## 2024-07-09 NOTE — Therapy (Signed)
 OUTPATIENT PHYSICAL THERAPY TREATMENT NOTE   Patient Name: [name] MRN: [id number] DOB:Aug 19, 1946, 78 y.o., male Today's Date: 07/10/2024  END OF SESSION:  PT End of Session - 07/10/24 1313     Visit Number 8    Date for PT Re-Evaluation 07/21/24    Authorization Type [employer] 05/26/2024-07/21/2024-auth#[id number]    Authorization - Visit Number 8    Authorization - Number of Visits 16    Progress Note Due on Visit 10    PT Start Time 1232    PT Stop Time 1313    PT Time Calculation (min) 41 min    Activity Tolerance Patient tolerated treatment well    Behavior During Therapy [facility] for tasks assessed/performed               Past Medical History:  Diagnosis Date   Abscess    right posterior neck   Anticoagulation adequate 06/07/2019   Overview:  SSS, paroxAFib/Flutter, CHADSvasc= 3 age, h/o cva; chronic OAC Xarelto    Atrial fibrillation and flutter (HCC) 01/01/2015   Noted 2016. Xarelto     BPH associated with nocturia 08/14/2014   Finasteride , flomax .  0-3x nocturia. PSA to 14-->eval urology [facility]. PSA trended back down to about 2.6 per records in 2015 then placed on finasteride - had significant voiding issues when spiked to 14- improved on meds    Bradycardia    CAD (coronary artery disease) 05/26/2016   Coronary CTA_ 50-75% D1 off CT report    Cardiac pacemaker in situ 12/28/2009   SA node dysfunction as cause    Chest pressure 12/01/2015   Overview:  atypical CP, [facility] nuclear perfusion imaging w/ no inarct, no ischemia, EF-40% NICM   Chronic systolic heart failure (HCC) 12/01/2015   Overview:  [facility] nuclear perfusion imaging study, no inarct, no ischemia, EF-40% NICM   CVA (cerebral vascular accident) (HCC)    Double vision 07/04/2018   Elevated LFTs    Erectile dysfunction 01/30/2015   cialis  prn    External hemorrhoids    Fitting or adjustment of cardiac pacemaker 06/07/2019   Overview:  Biotronik Lockhart DR [id number], RA and RV leads [id number]:  [id number] and [id number]    Former smoker 01/30/2015   Former smoker. 20 pack years quit 92. Patient declined AAA screen.     GERD (gastroesophageal reflux disease)    Gout 11/11/2009   Qualifier: Diagnosis of  By: Gardenia Pao     History of nuclear stress test 06/07/2019   Overview:  [facility], no inarct, no ischemia, EF-40% NICM   HTN (hypertension)    Hyperglycemia 06/10/2017   110s CBG 2018   Hyperlipidemia 07/12/2007   Crestor  20mg  daily with some myalgias; fish oil. Trig 200-500 despite this. LDL ok.        Insomnia 01/30/2015   ambien  5mg  prn. May refill.     Ischemic optic neuropathy    Left chest pressure 03/19/2015   Other and unspecified hyperlipidemia    Sinoatrial node dysfunction (HCC)    Spinal stenosis    Trigger finger, right middle finger 03/01/2018   Past Surgical History:  Procedure Laterality Date   ABSCESS DRAINAGE     righ tposterior neck   CARDIAC CATHETERIZATION  1996   CHOLECYSTECTOMY  02/07/2003   COLONOSCOPY  06/23/2004   EYE SURGERY     left eye   left ingunial hernia  06/08/2011   PACEMAKER INSERTION     right hip replacement     2012 Dr. Hiram   Patient Active  Problem List   Diagnosis Date Noted   Aortic atherosclerosis (HCC) 08/01/2020   Fatty liver 08/01/2020   Paroxysmal atrial fibrillation (HCC) 07/27/2019   Anticoagulation adequate 06/07/2019   Convergence insufficiency 06/07/2019   Fitting or adjustment of cardiac pacemaker 06/07/2019   Pacemaker-dependent due to native cardiac rhythm insufficient to support life 06/07/2019   Palpitations 06/07/2019   History of nuclear stress test 06/07/2019   S/P cardiac cath 06/07/2019   Non-occlusive coronary artery disease 06/07/2019   Double vision 07/04/2018   Trigger finger, right middle finger 03/01/2018   Hyperglycemia 06/10/2017   Solitary pulmonary nodule 05/26/2016   CAD (coronary artery disease) 05/26/2016   Nonischemic cardiomyopathy (HCC) 04/23/2016   Chronic systolic heart failure (HCC) 12/01/2015   Chest  pressure 12/01/2015   Left chest pressure 03/19/2015   Insomnia 01/30/2015   Erectile dysfunction 01/30/2015   Cerebral infarction (HCC) 01/30/2015   Former smoker 01/30/2015   Atrial fibrillation and flutter (HCC) 01/01/2015   Allergic rhinitis 10/11/2014   BPH associated with nocturia 08/14/2014   Ventricular tachycardia, non-sustained (HCC) 01/25/2013   Sinoatrial node dysfunction (HCC)    SPINAL STENOSIS, LUMBAR 08/22/2010   GERD 03/17/2010   Cardiac pacemaker in situ 12/28/2009   Gout 11/11/2009   Hyperlipidemia 07/12/2007   NEUROPATHY, ISCHEMIC OPTIC 06/02/2007   Essential hypertension 06/02/2007    PCP: [name]., MD  REFERRING PROVIDER: [name], DO  REFERRING DIAG: M54.42,M54.41,G89.29 (ICD-10-CM) - Chronic bilateral low back pain with bilateral sciatica M51.362 (ICD-10-CM) - Degeneration of intervertebral disc of lumbar region with discogenic back pain and lower extremity pain  Rationale for Evaluation and Treatment: Rehabilitation  THERAPY DIAG:  Other low back pain  Other abnormalities of gait and mobility  Difficulty in walking, not elsewhere classified  Muscle weakness (generalized)  ONSET DATE: February 2024 first visit to Dr Leonce  SUBJECTIVE:                                                                                                                                                                                           SUBJECTIVE STATEMENT: Patient concerned about his HR being below 60 with his pacemaker. He saw the cardiologist Friday. He has been   PERTINENT HISTORY:  [name] (next upcoming trip 7/14 for a [place] cruise) OA, trigger fingers, right hip arthroplasty, pacemaker CVA 30 years ago affecting L side  PAIN:  Are you having pain? Yes: NPRS scale: 0-1/10 Pain location: low back Pain description: discomfort Aggravating factors: worst first thing in the morning or after lying on his couch,  swimming Relieving factors: medication, movement  PRECAUTIONS: ICD/Pacemaker  RED FLAGS: None  WEIGHT BEARING RESTRICTIONS: No  FALLS:  Has patient fallen in last 6 months? Yes. Number of falls 1 fall.  Tripped over a chair while traveling at [place]  LIVING ENVIRONMENT: Lives with: lives with their spouse Lives in: House/apartment Stairs: one story Has following equipment at home: None  OCCUPATION: Retired  PLOF: Independent and Leisure: traveling (many upcoming trips planned), swimming  PATIENT GOALS: To feel better and not have to be reliant on pain medication as much and improve balance and flexibility.  NEXT MD VISIT: Follow up with Dr Leonce as needed  OBJECTIVE:  Note: Objective measures were completed at Evaluation unless otherwise noted.  DIAGNOSTIC FINDINGS:  Lumbar CT Scan on 02/04/2023: IMPRESSION: Multilevel degenerative changes which are most severe at the L3-4 level with significant spinal stenosis. No acute traumatic abnormalities identified.  PATIENT SURVEYS:  Modified Oswestry:  MODIFIED OSWESTRY DISABILITY SCALE   Date: 05/26/2024 Score  Pain intensity 2 =  Pain medication provides me with complete relief from pain.  2. Personal care (washing, dressing, etc.) 0 =  I can take care of myself normally without causing increased pain.  3. Lifting 2 = Pain prevents me from lifting heavy weights off the floor, activities (eg. sports, dancing). but I can manage if the weights are conveniently positioned (3) Pain prevents me from going out very often. (eg, on a table).  4. Walking 0 = Pain does not prevent me from walking any distance  5. Sitting 0 =  I can sit in any chair as long as I like.  6. Standing 1 =  I can stand as long as I want but, it increases my pain.  7. Sleeping 1 = I can sleep well only by using pain medication.  8. Social Life 0 = My social life is normal and does not increase my pain.  9. Traveling 2 =  My pain restricts my travel over 2  hours.  10. Employment/ Homemaking 1 = My normal homemaking/job activities increase my pain, but I can still perform all that is required of me  Total 9 / 50 = 18.0 %   Interpretation of scores: Score Category Description  0-20% Minimal Disability The patient can cope with most living activities. Usually no treatment is indicated apart from advice on lifting, sitting and exercise  21-40% Moderate Disability The patient experiences more pain and difficulty with sitting, lifting and standing. Travel and social life are more difficult and they may be disabled from work. Personal care, sexual activity and sleeping are not grossly affected, and the patient can usually be managed by conservative means  41-60% Severe Disability Pain remains the main problem in this group, but activities of daily living are affected. These patients require a detailed investigation  61-80% Crippled Back pain impinges on all aspects of the patient's life. Positive intervention is required  81-100% Bed-bound  These patients are either bed-bound or exaggerating their symptoms  Bluford FORBES Zoe DELENA Karon DELENA, et al. Surgery versus conservative management of stable thoracolumbar fracture: the PRESTO feasibility RCT. [place] ([place]): [employer]; 2021 Nov. [facility] Technology Assessment, No. 25.62.) Appendix 3, Oswestry Disability Index category descriptors. Available from: FindJewelers.cz  Minimally Clinically Important Difference (MCID) = 12.8%   8/6/2 ODI 4/50 = 8%  COGNITION: Overall cognitive status: Within functional limits for tasks assessed     SENSATION: Patient states that he has a neuropathy problem in his feet, but it has improved since starting Gabapentin   MUSCLE LENGTH: Hamstrings: increased tightness bilaterally Piriformis:  tightness bilat  POSTURE: rounded shoulders and forward head   LUMBAR ROM:   Eval:  Decreased by approx 50%  LOWER EXTREMITY ROM:      WFL  LOWER EXTREMITY MMT:    Eval:   LE grossly WNL, but grossly 4+ to 5-/5 in right hip Core strength grossly 4-/5 throughout  LUMBAR SPECIAL TESTS:  Slump test: Negative  FUNCTIONAL TESTS:  Eval: 5 times sit to stand: 11.87 sec Single Leg Stance:  right- 1.53 sec, left- 1.40 sec  GAIT: Distance walked: >500 ft Assistive device utilized: None Level of assistance: Complete Independence Comments: States that he has more difficulty with balance on unlevel surfaces (like grass) than firm surfaces.  TREATMENT DATE:  Side stepping yellow loop at barre   Monster walking with yellow loop at barre  3 way step yellow loop 2 x 5 B  Sit to stand holding 5# kettlebell: to fatigue  Squat to chair + foam 2x10 Semi Tandem balance - with head turns Foot on foam with head turns  Toe touch front and back to dots fwd and bwd Aura carry with 20# KB x 1 laps ea hand length of hallway Discussion of HR and pacemaker  - monitoring of HR throughout session   07/05/2024: ODI, Assessed stairs Aura carry  with 20# KB x 2 laps ea hand length of hallway Side stepping yellow loop at [employer] walking with yellow loop at barre  3 way step yellow loop 2 x 5 B  Seated clam red x 20 not too challenging  S/L Red clam x 30 - not very challenging Sit to stand holding 5# kettlebell:  x 10  Squat to mat + 2 foam 2x10 Tandem walking - challenging Tandem stance  - multiple reps    07/03/2024: Nustep level 5 x5 min with PT present to discuss status Seated hamstring stretch 3x20 sec bilat Seated piriformis stretch 2x20 sec bilat Sit to/from stand holding 5# kettlebell:  x 10  Gait: intermittent LOB, increased BOS Tandem walking - challenging Tandem stance  - multiple reps  Split stance on 8 inch step balance SLS Side stepping yellow loop at barre Monster walking with yellow loop at barre Seated clam yellow x 20 (increase resistance)  06/09/2024: Nustep level 5 x5 min with PT present to  discuss status Seated hamstring stretch 2x20 sec bilat Seated piriformis stretch 2x20 sec bilat Sit to/from stand holding 5# kettlebell:  x 10 with chest press, x 10 with overhead press Seated modified Russian twist with 5# kettle bell 2x10 Standing shoulder rows 10# and 15# 1x10 ea Standing B shouder ext green x 20 in split stance Resisted walking 10# 4 way (CGA with left direction) Standing single leg clock tap with UE support x10 bilat Hurdle step overs 2x10 B challenging on R Standing B counter stretch 2x10 sec ea  Stair taps 1x10     06/07/2024: Nustep level 5 x5 min with PT present to discuss status Seated hamstring stretch 2x20 sec bilat Seated piriformis stretch 2x20 sec bilat Sit to/from stand holding 5# kettlebell:  x 10 with chest press, x 10 with overhead press Seated modified Russian twist with 5# kettle bell 2x10 Seated blue pball rollout x10 and bil x 10 ea Seated with good upright posture shoulder horizontal abduction with green tband 1x10, blue 1x10  Standing shoulder ER with green tband 2x10 Standing shoulder rows with green tband 2x10 try Matrix next Standing single leg clock tap with UE support x10 bilat Standing  B counter stretch 2x10 sec ea  Tandem gait at barre down and back x3 laps Tandem stance multiple reps Stair taps 2x10  Hip hikes x 10 B  PATIENT EDUCATION:  Education details: Issued HEP Person educated: Patient Education method: Explanation, Demonstration, and Handouts Education comprehension: verbalized understanding and returned demonstration  HOME EXERCISE PROGRAM: Access Code: [facility] URL: [email or web address] Date: 07/05/2024 Prepared by: Mliss  Exercises - Seated Transversus Abdominis Bracing  - 1 x daily - 7 x weekly - 2 sets - 10 reps - Seated Hamstring Stretch  - 1 x daily - 7 x weekly - 2 reps - 20 sec hold - Seated Piriformis Stretch  - 1 x daily - 7 x weekly - 2 reps - 20 sec hold - Single Leg Stance with Support   - 1 x daily - 7 x weekly - 2 reps - 20 sec hold - Shoulder External Rotation and Scapular Retraction with Resistance  - 1 x daily - 7 x weekly - 2 sets - 10 reps - Standing Shoulder Horizontal Abduction with Resistance  - 1 x daily - 7 x weekly - 2 sets - 10 reps - Standing Shoulder Row with Anchored Resistance  - 1 x daily - 7 x weekly - 2 sets - 10 reps - Shoulder extension with resistance - Neutral  - 1 x daily - 7 x weekly - 2 sets - 10 reps - Single Leg Balance with Clock Reach  - 1 x daily - 7 x weekly - 10 reps - Standing 'L' Stretch at Counter  - 1 x daily - 7 x weekly - 2 reps - 20 sec hold - Resisted Sit-to-Stand With Dumbbell at Chest  - 1 x daily - 3 x weekly - 2 sets - 10 reps - Side Step Overs with Cones and Unilateral Counter Support  - 1 x daily - 3 x weekly - 2 sets - 10 reps - Standing 3-Way Leg Reach with Resistance at Ankles and Counter Support  - 1 x daily - 3 x weekly - 2 sets - 10 reps - Side Stepping with Resistance at Ankles  - 1 x daily - 3 x weekly - 2 sets - 10 reps - Forward Monster Walk with Resistance at Ankles and Counter Support  - 1 x daily - 3 x weekly - 2 sets - 10 reps - Squat with Chair Touch  - 1 x daily - 3 x weekly - 2 sets - 10 reps  ASSESSMENT:  CLINICAL IMPRESSION:  Patient reports that he had his pacemaker checked Friday and since then has felt off. His HR is not supposed to go below 60 bpm and it has been. Patient monitored throughout session and HR stayed around 54 bpm. He had one incidence at the start of treatment where his HR went up to 80 bpm after sidestepping. Pacemaker is scheduled to be replaced in October. Patient took intermittent rest breaks as needed, but tolerated exercises well overall. Good improvement of balance with front and back toe taps. Also challenged with one foot on foam with head turns.   OBJECTIVE IMPAIRMENTS: decreased balance, decreased strength, increased muscle spasms, impaired flexibility, postural dysfunction, and  pain.   ACTIVITY LIMITATIONS: lifting, standing, and stairs  PARTICIPATION LIMITATIONS: driving, community activity, and yard work  PERSONAL FACTORS: Time since onset of injury/illness/exacerbation and 1-2 comorbidities: OA, right hip arthroplasty are also affecting patient's functional outcome.   REHAB POTENTIAL: Good  CLINICAL DECISION MAKING: Stable/uncomplicated  EVALUATION COMPLEXITY: Low  GOALS: Goals reviewed with patient? Yes  SHORT TERM GOALS: Target date: 06/16/2024  Patient will be independent with initial HEP. Baseline: Goal status: Met on 05/31/24  2.  Patient will demonstrate and verbalize knowledge of proper lifting techniques to allow him to lift suitcases for travel. Baseline:  Goal status: Ongoing   LONG TERM GOALS: Target date: 07/21/2024  Patient will be independent with advanced HEP to allow for self progression after discharge. Baseline:  Goal status: INITIAL  2.  Patient will improve modified Oswestry to not greater than 5% to demonstrate improved functional mobility. Baseline: 18% Goal status: IN PROGRESS 07/05/24  8%   3.  Patient will increase right hip strength to [facility] to allow him to navigate stairs with reciprocal pattern without difficulty/discomfort. Baseline:  Goal status: MET 07/05/24  4.  Patient will increase bilateral single leg stance to greater than 10 seconds to decrease patients fall risk. Baseline: right- 1.53 sec, left- 1.40 sec Goal status: IN PROGRESS 07/03/24  5.  Patient will report ability to ambulate over various unlevel surfaces for desired length (at least greater than 30 minutes) without a loss of balance to allow patient to travel and sight-see. Baseline:  Goal status: INITIAL   PLAN:  PT FREQUENCY: 1-2x/week  PT DURATION: 8 weeks  PLANNED INTERVENTIONS: 97164- PT Re-evaluation, 97750- Physical Performance Testing, 97110-Therapeutic exercises, 97530- Therapeutic activity, [id number]- Neuromuscular re-education, 97535- Self  Care, 02859- Manual therapy, [id number]- Gait training, [phone]- Canalith repositioning, [id number]- Aquatic Therapy, [phone]- Electrical stimulation (unattended), [phone]- Electrical stimulation (manual), [id number]- Ultrasound, [id number]- Traction (mechanical), [id number]- Ionotophoresis 4mg /ml Dexamethasone , 79439 (1-2 muscles), 20561 (3+ muscles)- Dry Needling, Patient/Family education, Balance training, Stair training, Taping, Joint mobilization, Joint manipulation, Spinal manipulation, Spinal mobilization, Cryotherapy, and Moist heat.  PLAN FOR NEXT SESSION: continue with side step, monster walk and balance   Mliss Cummins, PT  07/10/24, 1:25 PM  [facility] [address] [place], [place] [address] Phone # [phone] Fax [phone]

## 2024-07-10 ENCOUNTER — Ambulatory Visit: Attending: Internal Medicine

## 2024-07-10 ENCOUNTER — Encounter: Payer: Self-pay | Admitting: Internal Medicine

## 2024-07-10 ENCOUNTER — Ambulatory Visit: Admitting: Physical Therapy

## 2024-07-10 ENCOUNTER — Telehealth: Payer: Self-pay | Admitting: Internal Medicine

## 2024-07-10 ENCOUNTER — Encounter: Payer: Self-pay | Admitting: Physical Therapy

## 2024-07-10 DIAGNOSIS — M5459 Other low back pain: Secondary | ICD-10-CM

## 2024-07-10 DIAGNOSIS — I495 Sick sinus syndrome: Secondary | ICD-10-CM

## 2024-07-10 DIAGNOSIS — R262 Difficulty in walking, not elsewhere classified: Secondary | ICD-10-CM

## 2024-07-10 DIAGNOSIS — M6281 Muscle weakness (generalized): Secondary | ICD-10-CM

## 2024-07-10 DIAGNOSIS — I480 Paroxysmal atrial fibrillation: Secondary | ICD-10-CM

## 2024-07-10 DIAGNOSIS — R293 Abnormal posture: Secondary | ICD-10-CM | POA: Diagnosis not present

## 2024-07-10 DIAGNOSIS — R252 Cramp and spasm: Secondary | ICD-10-CM | POA: Diagnosis not present

## 2024-07-10 DIAGNOSIS — R2689 Other abnormalities of gait and mobility: Secondary | ICD-10-CM | POA: Diagnosis not present

## 2024-07-10 NOTE — Telephone Encounter (Signed)
 STAT if HR is under 50 or over 120  (normal HR is 60-100 beats per minute)  What is your heart rate?  62 just a little while ago  Do you have a log of your heart rate readings (document readings)?  57 when leaving physical therapy about 30 minutes ago. Ranged 52-60 bpm during activity in therapy for 45 minutes. Finally reached 60 after carrying a heavy weight.  Do you have any other symptoms?  Exhaustion. Patient assumes HR is low due to PPM check on 8/08.patient says HR hasn't been below 60 bpm in 20 years until PPM check. Please advise.

## 2024-07-10 NOTE — Telephone Encounter (Signed)
 Reason for walk-in: Walk-in Reasons: requesting to be seen today.  If patient is requesting to be seen today, or if patient is having symptoms:  What symptoms are being reported (if any)?  Low HR due to pacemaker issues; has been in contact with someone via MyChart.  Route to triage pool and ensure Teams message has been sent to the Triage Walk-In chat.  3.   For medication samples, medication refills, HIM requests, appointment requests, lab-related requests, or form/record drop-off, please route to the appropriate pool.

## 2024-07-10 NOTE — Progress Notes (Signed)
 Patient walked in to clinic today complaining of heart rate drops in the low 50's and concerned for his device.  He knows his device is programmed at 52.  Also having difficulty with exercises and activity due to low heart rate support, states he is not feeling that great since his clinic visit on Friday 07/07/24.   Interrogation today reveals that patient has now triggered ERI on 8/8 after leaving clinic and is now in a VDD mode, VP 100% of time.  (Usually programmed DDDR at 60bpm). Because he is atrially dependent and never V paces - he is feeling the dyssynchrony and loss of atrial kick/support.  In addition, his Evia device is an older device that will pace him 11% less than the programmed LRL.  So, in actuality it was only pacing him at 53bpm.    Reviewed with Pavan from [employer] to discuss how to best support patient's symptoms with this mode.    The following programming changes were made:   LRL increased to 80bpm which will VP patient at 70bpm.  Discussed with [employer]- there is no way to program rate response with patient in this back up mode.   RV output programmed to 3.0V @ 0.16ms for improved safety margin.  RV tested 1.4@0 .75ms on 07/07/24.  Plan:  Due to ERI status and patients symptoms, will contact Dr. Waddell to schedule for expedited generator change out.  Will follow up with patient after reviewing with Dr. Waddell next steps and plan.   Patient has a trip out of the country planned for September and is hoping to have this resolved so that he feels well for his trip.

## 2024-07-10 NOTE — Telephone Encounter (Signed)
 Patient walked in to clinic today complaining of heart rate drops in the low 50's and concerned for his device.  He knows his device is programmed at 34.  Also having difficulty with exercises and activity due to low heart rate support, states he is not feeling that great since his clinic visit on Friday 07/07/24.   Interrogation today reveals that patient has now triggered ERI on 8/8 and is now in a VDD mode, VP 100% of time at 53bpm.  (Usually programmed DDDR at 60bpm). Because he is atrially dependent and never V paces - he is feeling the dyssynchrony and loss of atrial kick/support.  In addition, his Evia device is an older device that will pace him 11% less than the programmed LRL.  So, in actuality it was only pacing him at 53bpm.    Reviewed with Pavan from [employer] to discuss how to best support patient's symptoms with this mode.    The following programming changes were made:   LRL increased to 80bpm which will VP patient at 70bpm.  Discussed with [employer]- there is no way to program rate response with patient in this back up mode.   RV output programmed to 3.0V @ 0.40ms for improved safety margin.  RV tested 1.4@0 .75ms on 07/07/24.  Plan:  Due to ERI status and patients symptoms, will contact Dr. Waddell to schedule for expedited generator change out.  Will follow up with patient after reviewing with Dr. Waddell next steps and plan.   Patient has a trip out of the country planned for September and is hoping to have this resolved so that he feels well for his trip.

## 2024-07-10 NOTE — Telephone Encounter (Signed)
Duplicate message.Opened in error.

## 2024-07-10 NOTE — Telephone Encounter (Signed)
 Patient was walk in and have consulted with staff in office.

## 2024-07-10 NOTE — Telephone Encounter (Signed)
 Pt reports that his hr has been in the 50's, he has some dizziness when he stands up and has been very tired. This has been ongoing since Friday.   His hr= 53 O2 sat= 95%  He reports that at PT this morning his hr only got up to about 57 with activity.  Information given to Alan Fees, RN. She will be in to see the patient soon.

## 2024-07-11 ENCOUNTER — Telehealth (HOSPITAL_COMMUNITY): Payer: Self-pay

## 2024-07-11 DIAGNOSIS — I495 Sick sinus syndrome: Secondary | ICD-10-CM | POA: Diagnosis not present

## 2024-07-11 DIAGNOSIS — I480 Paroxysmal atrial fibrillation: Secondary | ICD-10-CM | POA: Diagnosis not present

## 2024-07-11 NOTE — Telephone Encounter (Signed)
 Spoke with patient to discuss upcoming procedure.   Confirmed patient is scheduled for a PPM generator change on Thursday, August 13 with Dr. Danelle Birmingham. Instructed patient to arrive at the Main Entrance A at [facility]: [address] [place], [place] [address] and check in at Admitting at 12:30 PM.   Labs completed 8/12.  Any recent signs of acute illness or been started on antibiotics? No Any new medications started? No Any medications to hold? Hold Xarelto  for 1 day prior to your procedure- last dose August 11. Medication instructions:  On the morning of your procedure you may take all other morning medications not discussed with a small sip of water.  No eating or drinking after midnight prior to procedure.   The night before your procedure and the morning of your procedure, wash thoroughly with the CHG surgical soap from the neck down, paying special attention to the area where your procedure will be performed.  Advised of plan to go home the same day and will only stay overnight if medically necessary. You MUST have a responsible adult to drive you home and MUST be with you the first 24 hours after you arrive home.  Patient verbalized understanding to all instructions provided and agreed to proceed with procedure.

## 2024-07-11 NOTE — Telephone Encounter (Signed)
 Called pt and he is scheduled for PPM Gen Change on 8/13 at 230 pm with Dr. Waddell.  He will come today for labs - I will put them in as STAT to make sure we get the results back in time.  Pt has scrub and understands instructions.  Per Dr. Waddell - Pt will hold Xarelto  1 night.  Pt has Biotronik system and MDT leads - Dr. Waddell will replace with MDT system to match leads.   Pt understands all instructions and has no questions at this time.   I will send instruction letter via MyChart.

## 2024-07-12 ENCOUNTER — Other Ambulatory Visit: Payer: Self-pay

## 2024-07-12 ENCOUNTER — Encounter (HOSPITAL_COMMUNITY)
Admission: RE | Disposition: A | Discharge status: Home / Self Care | Payer: Self-pay | Source: Home / Self Care | Attending: Internal Medicine

## 2024-07-12 ENCOUNTER — Ambulatory Visit: Admitting: Physical Therapy

## 2024-07-12 ENCOUNTER — Ambulatory Visit (HOSPITAL_COMMUNITY)
Admission: RE | Admit: 2024-07-12 | Discharge: 2024-07-12 | Disposition: A | Discharge status: Home / Self Care | Attending: Internal Medicine | Admitting: Internal Medicine

## 2024-07-12 DIAGNOSIS — I4891 Unspecified atrial fibrillation: Secondary | ICD-10-CM | POA: Diagnosis not present

## 2024-07-12 DIAGNOSIS — I2510 Atherosclerotic heart disease of native coronary artery without angina pectoris: Secondary | ICD-10-CM | POA: Insufficient documentation

## 2024-07-12 DIAGNOSIS — Z87891 Personal history of nicotine dependence: Secondary | ICD-10-CM | POA: Diagnosis not present

## 2024-07-12 DIAGNOSIS — E785 Hyperlipidemia, unspecified: Secondary | ICD-10-CM | POA: Insufficient documentation

## 2024-07-12 DIAGNOSIS — Z45010 Encounter for checking and testing of cardiac pacemaker pulse generator [battery]: Secondary | ICD-10-CM | POA: Diagnosis not present

## 2024-07-12 DIAGNOSIS — I495 Sick sinus syndrome: Secondary | ICD-10-CM | POA: Diagnosis not present

## 2024-07-12 HISTORY — PX: PPM GENERATOR CHANGEOUT: EP1233

## 2024-07-12 LAB — BASIC METABOLIC PANEL WITH GFR
BUN/Creatinine Ratio: 17 (ref 10–24)
BUN: 24 mg/dL (ref 8–27)
CO2: 20 mmol/L (ref 20–29)
Calcium: 9.8 mg/dL (ref 8.6–10.2)
Chloride: 104 mmol/L (ref 96–106)
Creatinine, Ser: 1.44 mg/dL — ABNORMAL HIGH (ref 0.76–1.27)
Glucose: 134 mg/dL — ABNORMAL HIGH (ref 70–99)
Potassium: 4.3 mmol/L (ref 3.5–5.2)
Sodium: 142 mmol/L (ref 134–144)
eGFR: 50 mL/min/1.73 — ABNORMAL LOW (ref >59)

## 2024-07-12 LAB — CBC
Hematocrit: 45.8 % (ref 37.5–51.0)
Hemoglobin: 15.2 g/dL (ref 13.0–17.7)
MCH: 30.7 pg (ref 26.6–33.0)
MCHC: 33.2 g/dL (ref 31.5–35.7)
MCV: 93 fL (ref 79–97)
Platelets: 417 x10E3/uL (ref 150–450)
RBC: 4.95 x10E6/uL (ref 4.14–5.80)
RDW: 13.9 % (ref 11.6–15.4)
WBC: 9.5 x10E3/uL (ref 3.4–10.8)

## 2024-07-12 SURGERY — PPM GENERATOR CHANGEOUT
Anesthesia: LOCAL

## 2024-07-12 MED ORDER — CEFAZOLIN SODIUM-DEXTROSE 2-4 GM/100ML-% IV SOLN
2.0000 g | INTRAVENOUS | Status: AC
  Administered 2024-07-12 (×2): 2 g via INTRAVENOUS

## 2024-07-12 MED ORDER — ONDANSETRON HCL 4 MG/2ML IJ SOLN: 4.0000 mg | Freq: Four times a day (QID) | INTRAMUSCULAR | Status: DC | PRN

## 2024-07-12 MED ORDER — ACETAMINOPHEN 325 MG PO TABS: 325.0000 mg | ORAL_TABLET | ORAL | Status: DC | PRN

## 2024-07-12 MED ORDER — SODIUM CHLORIDE 0.9 % IV SOLN: INTRAVENOUS | Status: DC

## 2024-07-12 MED ORDER — FENTANYL CITRATE (PF) 100 MCG/2ML IJ SOLN
INTRAMUSCULAR | Status: AC
  Filled 2024-07-12: qty 2

## 2024-07-12 MED ORDER — SODIUM CHLORIDE 0.9 % IV SOLN
INTRAVENOUS | Status: AC
  Filled 2024-07-12: qty 2

## 2024-07-12 MED ORDER — MIDAZOLAM HCL 2 MG/2ML IJ SOLN
INTRAMUSCULAR | Status: AC
  Filled 2024-07-12: qty 2

## 2024-07-12 MED ORDER — LIDOCAINE HCL (PF) 1 % IJ SOLN
INTRAMUSCULAR | Status: DC | PRN
  Administered 2024-07-12 (×2): 50 mL

## 2024-07-12 MED ORDER — POVIDONE-IODINE 10 % EX SWAB: 2.0000 | Freq: Once | CUTANEOUS | Status: DC

## 2024-07-12 MED ORDER — SODIUM CHLORIDE 0.9 % IV SOLN
80.0000 mg | INTRAVENOUS | Status: AC
  Administered 2024-07-12 (×2): 80 mg

## 2024-07-12 MED ORDER — LIDOCAINE HCL (PF) 1 % IJ SOLN
INTRAMUSCULAR | Status: AC
Start: 2024-07-12 — End: 2024-07-12
  Filled 2024-07-12: qty 60

## 2024-07-12 MED ORDER — FENTANYL CITRATE (PF) 100 MCG/2ML IJ SOLN
INTRAMUSCULAR | Status: DC | PRN
  Administered 2024-07-12 (×4): 12.5 ug via INTRAVENOUS

## 2024-07-12 MED ORDER — MIDAZOLAM HCL 5 MG/5ML IJ SOLN
INTRAMUSCULAR | Status: DC | PRN
  Administered 2024-07-12 (×4): 1 mg via INTRAVENOUS

## 2024-07-12 MED ORDER — CEFAZOLIN SODIUM-DEXTROSE 2-4 GM/100ML-% IV SOLN
INTRAVENOUS | Status: AC
  Filled 2024-07-12: qty 100

## 2024-07-12 MED ORDER — CHLORHEXIDINE GLUCONATE 4 % EX SOLN
4.0000 | Freq: Once | CUTANEOUS | Status: DC
  Filled 2024-07-12: qty 60

## 2024-07-12 SURGICAL SUPPLY — 5 items
CABLE SURGICAL S-101-97-12 (CABLE) ×1 IMPLANT
IPG PACE AZUR XT DR MRI W1DR01 (Pacemaker) IMPLANT
PAD DEFIB RADIO PHYSIO CONN (PAD) ×1 IMPLANT
POUCH AIGIS-R ANTIBACT PPM MED (Mesh General) IMPLANT
TRAY PACEMAKER INSERTION (PACKS) ×1 IMPLANT

## 2024-07-12 NOTE — Discharge Instructions (Signed)

## 2024-07-12 NOTE — Interval H&P Note (Signed)
 History and Physical Interval Note:  07/12/2024 1:35 PM  [name]  has presented today for surgery, with the diagnosis of Pacemaker ERI.  The various methods of treatment have been discussed with the patient and family. After consideration of risks, benefits and other options for treatment, the patient has consented to  Procedure(s): PPM GENERATOR CHANGEOUT (N/A) as a surgical intervention.  The patient's history has been reviewed, patient examined, no change in status, stable for surgery.  I have reviewed the patient's chart and labs.  Questions were answered to the patient's satisfaction.     Danelle Birmingham

## 2024-07-13 ENCOUNTER — Telehealth: Payer: Self-pay

## 2024-07-13 ENCOUNTER — Encounter (HOSPITAL_COMMUNITY): Payer: Self-pay | Admitting: Internal Medicine

## 2024-07-13 NOTE — Telephone Encounter (Signed)
 Follow-up after same day discharge: Implant date: 07/12/2024 MD: Danelle Birmingham, MD Device: ppm mdt  Location: L chest    Wound check visit: 07/27/2024 90 day MD follow-up: 10/23/2024  Remote Transmission received:not yet pt has not plugged in monitoe   Dressing/sling removed: not yet will remove this afternoon   Confirm OAC restart on: n/a  Please continue to monitor your cardiac device site for redness, swelling, and drainage. Call the device clinic at [phone] if you experience these symptoms, fever/chills, or have questions about your device.   Remote monitoring is used to monitor your cardiac device from home. This monitoring is scheduled every 91 days by our office. It allows us  to keep an eye on the functioning of your device to ensure it is working properly.

## 2024-07-14 MED FILL — Midazolam HCl Inj 2 MG/2ML (Base Equivalent): INTRAMUSCULAR | Qty: 2 | Status: AC

## 2024-07-18 NOTE — Therapy (Incomplete)
 OUTPATIENT PHYSICAL THERAPY TREATMENT NOTE   Patient Name: [name] MRN: [id number] DOB:12-Nov-1946, 78 y.o., male Today's Date: 07/18/2024  END OF SESSION:         Past Medical History:  Diagnosis Date   Abscess    right posterior neck   Anticoagulation adequate 06/07/2019   Overview:  SSS, paroxAFib/Flutter, CHADSvasc= 3 age, h/o cva; chronic OAC Xarelto    Atrial fibrillation and flutter (HCC) 01/01/2015   Noted 2016. Xarelto     BPH associated with nocturia 08/14/2014   Finasteride , flomax .  0-3x nocturia. PSA to 14-->eval urology [facility]. PSA trended back down to about 2.6 per records in 2015 then placed on finasteride - had significant voiding issues when spiked to 14- improved on meds    Bradycardia    CAD (coronary artery disease) 05/26/2016   Coronary CTA_ 50-75% D1 off CT report    Cardiac pacemaker in situ 12/28/2009   SA node dysfunction as cause    Chest pressure 12/01/2015   Overview:  atypical CP, [facility] nuclear perfusion imaging w/ no inarct, no ischemia, EF-40% NICM   Chronic systolic heart failure (HCC) 12/01/2015   Overview:  [facility] nuclear perfusion imaging study, no inarct, no ischemia, EF-40% NICM   CVA (cerebral vascular accident) (HCC)    Double vision 07/04/2018   Elevated LFTs    Erectile dysfunction 01/30/2015   cialis  prn    External hemorrhoids    Fitting or adjustment of cardiac pacemaker 06/07/2019   Overview:  Biotronik [place] DR [id number], RA and RV leads [id number]:  [id number] and [id number]   Former smoker 01/30/2015   Former smoker. 20 pack years quit 92. Patient declined AAA screen.     GERD (gastroesophageal reflux disease)    Gout 11/11/2009   Qualifier: Diagnosis of  By: Gardenia Pao     History of nuclear stress test 06/07/2019   Overview:  [facility], no inarct, no ischemia, EF-40% NICM   HTN (hypertension)    Hyperglycemia 06/10/2017   110s CBG 2018   Hyperlipidemia 07/12/2007   Crestor  20mg  daily with some myalgias; fish oil. Trig 200-500 despite  this. LDL ok.        Insomnia 01/30/2015   ambien  5mg  prn. May refill.     Ischemic optic neuropathy    Left chest pressure 03/19/2015   Other and unspecified hyperlipidemia    Sinoatrial node dysfunction (HCC)    Spinal stenosis    Trigger finger, right middle finger 03/01/2018   Past Surgical History:  Procedure Laterality Date   ABSCESS DRAINAGE     righ tposterior neck   CARDIAC CATHETERIZATION  1996   CHOLECYSTECTOMY  02/07/2003   COLONOSCOPY  06/23/2004   EYE SURGERY     left eye   left ingunial hernia  06/08/2011   PACEMAKER INSERTION     PPM GENERATOR CHANGEOUT N/A 07/12/2024   Procedure: PPM GENERATOR CHANGEOUT;  Surgeon: Waddell Danelle ORN, MD;  Location: [facility] IN[facility]ASIVE [facility];  Service: Cardiovascular;  Laterality: N/A;   right hip replacement     2012 Dr. 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 06/07/2019   Palpitations 06/07/2019   History of nuclear stress test 06/07/2019   S/P cardiac cath  06/07/2019   Non-occlusive coronary artery disease 06/07/2019   Double vision 07/04/2018   Trigger finger, right middle finger 03/01/2018   Hyperglycemia 06/10/2017   Solitary pulmonary nodule 05/26/2016   CAD (coronary artery disease) 05/26/2016   Nonischemic cardiomyopathy (HCC) 04/23/2016   Chronic systolic heart failure (HCC) 12/01/2015   Chest pressure 12/01/2015   Left chest pressure 03/19/2015   Insomnia 01/30/2015   Erectile dysfunction 01/30/2015   Cerebral infarction (HCC) 01/30/2015   Former smoker 01/30/2015   Atrial fibrillation and flutter (HCC) 01/01/2015   Allergic rhinitis 10/11/2014   BPH associated with nocturia  08/14/2014   Ventricular tachycardia, non-sustained (HCC) 01/25/2013   Sinoatrial node dysfunction (HCC)    SPINAL STENOSIS, LUMBAR 08/22/2010   GERD 03/17/2010   Cardiac pacemaker in situ 12/28/2009   Gout 11/11/2009   Hyperlipidemia 07/12/2007   NEUROPATHY, ISCHEMIC OPTIC 06/02/2007   Essential hypertension 06/02/2007    PCP: [name]., MD  REFERRING PROVIDER: [name], DO  REFERRING DIAG: M54.42,M54.41,G89.29 (ICD-10-CM) - Chronic bilateral low back pain with bilateral sciatica M51.362 (ICD-10-CM) - Degeneration of intervertebral disc of lumbar region with discogenic back pain and lower extremity pain  Rationale for Evaluation and Treatment: Rehabilitation  THERAPY DIAG:  No diagnosis found.  ONSET DATE: February 2024 first visit to Dr Leonce  SUBJECTIVE:                                                                                                                                                                                           SUBJECTIVE STATEMENT: ***  PERTINENT HISTORY:  [name] (next upcoming trip 7/14 for a [place] cruise) OA, trigger fingers, right hip arthroplasty, pacemaker CVA 30 years ago affecting L side  PAIN:  Are you having pain? Yes: NPRS scale: 0-1/10 Pain location: low back Pain description: discomfort Aggravating factors: worst first thing in the morning or after lying on his couch, swimming Relieving factors: medication, movement  PRECAUTIONS: ICD/Pacemaker  RED FLAGS: None   WEIGHT BEARING RESTRICTIONS: No  FALLS:  Has patient fallen in last 6 months? Yes. Number of falls 1 fall.  Tripped over a chair while traveling at [place]  LIVING ENVIRONMENT: Lives with: lives with their spouse Lives in: House/apartment Stairs: one story Has following equipment at home: None  OCCUPATION: Retired  PLOF: Independent and Leisure: traveling (many upcoming trips planned), swimming  PATIENT GOALS: To feel better  and not have to be reliant on pain medication as much and improve balance and flexibility.  NEXT MD VISIT: Follow up with Dr Leonce as needed  OBJECTIVE:  Note: Objective measures were completed at Evaluation unless otherwise noted.  DIAGNOSTIC FINDINGS:  Lumbar CT Scan on 02/04/2023: IMPRESSION: Multilevel degenerative changes which are most severe at the L3-4 level with significant spinal stenosis. No acute traumatic abnormalities identified.  PATIENT SURVEYS:  Modified Oswestry:  MODIFIED OSWESTRY DISABILITY SCALE   Date: 05/26/2024 Score  Pain intensity 2 =  Pain medication provides me with complete relief from pain.  2. Personal care (washing, dressing, etc.) 0 =  I can take care of myself normally without causing increased pain.  3. Lifting 2 = Pain prevents me from lifting heavy weights off the floor, activities (eg. sports, dancing). but I can manage if the weights are conveniently positioned (3) Pain prevents me from going out very often. (eg, on a table).  4. Walking 0 = Pain does not prevent me from walking any distance  5. Sitting 0 =  I can sit in any chair as long as I like.  6. Standing 1 =  I can stand as long as I want but, it increases my pain.  7. Sleeping 1 = I can sleep well only by using pain medication.  8. Social Life 0 = My social life is normal and does not increase my pain.  9. Traveling 2 =  My pain restricts my travel over 2 hours.  10. Employment/ Homemaking 1 = My normal homemaking/job activities increase my pain, but I can still perform all that is required of me  Total 9 / 50 = 18.0 %   Interpretation of scores: Score Category Description  0-20% Minimal Disability The patient can cope with most living activities. Usually no treatment is indicated apart from advice on lifting, sitting and exercise  21-40% Moderate Disability The patient experiences more pain and difficulty with sitting, lifting and standing. Travel and social life are more difficult and  they may be disabled from work. Personal care, sexual activity and sleeping are not grossly affected, and the patient can usually be managed by conservative means  41-60% Severe Disability Pain remains the main problem in this group, but activities of daily living are affected. These patients require a detailed investigation  61-80% Crippled Back pain impinges on all aspects of the patient's life. Positive intervention is required  81-100% Bed-bound  These patients are either bed-bound or exaggerating their symptoms  Bluford FORBES Zoe DELENA Karon DELENA, et al. Surgery versus conservative management of stable thoracolumbar fracture: the PRESTO feasibility RCT. [place] ([place]): [employer]; 2021 Nov. [facility] Technology Assessment, No. 25.62.) Appendix 3, Oswestry Disability Index category descriptors. Available from: FindJewelers.cz  Minimally Clinically Important Difference (MCID) = 12.8%   8/6/2 ODI 4/50 = 8%  COGNITION: Overall cognitive status: Within functional limits for tasks assessed     SENSATION: Patient states that he has a neuropathy problem in his feet, but it has improved since starting Gabapentin   MUSCLE LENGTH: Hamstrings: increased tightness bilaterally Piriformis:  tightness bilat  POSTURE: rounded shoulders and forward head   LUMBAR ROM:   Eval:  Decreased by approx 50%  LOWER EXTREMITY ROM:     WFL  LOWER EXTREMITY MMT:    Eval:   LE grossly WNL, but grossly 4+ to 5-/5 in right hip Core strength grossly 4-/5 throughout  LUMBAR SPECIAL TESTS:  Slump test: Negative  FUNCTIONAL TESTS:  Eval: 5 times sit to stand: 11.87 sec Single Leg Stance:  right- 1.53 sec, left- 1.40 sec  GAIT: Distance walked: >500 ft Assistive device utilized: None Level of assistance: Complete Independence Comments: States that he has more difficulty with balance  on unlevel surfaces (like grass) than firm surfaces.  TREATMENT  DATE: 07/19/24 Side stepping yellow loop at [employer] walking with yellow loop at barre  3 way step yellow loop 2 x 5 B  Sit to stand holding 5# kettlebell: to fatigue  Squat to chair + foam 2x10 Semi Tandem balance - with head turns Foot on foam with head turns  Toe touch front and back to dots fwd and bwd Aura carry with 20# KB x 1 laps ea hand length of hallway     07/10/24 Side stepping yellow loop at [employer] walking with yellow loop at barre  3 way step yellow loop 2 x 5 B  Sit to stand holding 5# kettlebell: to fatigue  Squat to chair + foam 2x10 Semi Tandem balance - with head turns Foot on foam with head turns  Toe touch front and back to dots fwd and bwd Aura carry with 20# KB x 1 laps ea hand length of hallway Discussion of HR and pacemaker  - monitoring of HR throughout session   07/05/2024: ODI, Assessed stairs Aura carry  with 20# KB x 2 laps ea hand length of hallway Side stepping yellow loop at [employer] walking with yellow loop at barre  3 way step yellow loop 2 x 5 B  Seated clam red x 20 not too challenging  S/L Red clam x 30 - not very challenging Sit to stand holding 5# kettlebell:  x 10  Squat to mat + 2 foam 2x10 Tandem walking - challenging Tandem stance  - multiple reps    07/03/2024: Nustep level 5 x5 min with PT present to discuss status Seated hamstring stretch 3x20 sec bilat Seated piriformis stretch 2x20 sec bilat Sit to/from stand holding 5# kettlebell:  x 10  Gait: intermittent LOB, increased BOS Tandem walking - challenging Tandem stance  - multiple reps  Split stance on 8 inch step balance SLS Side stepping yellow loop at barre Monster walking with yellow loop at barre Seated clam yellow x 20 (increase resistance) PATIENT EDUCATION:  Education details: Issued HEP Person educated: Patient Education method: Explanation, Demonstration, and Handouts Education comprehension: verbalized understanding and returned  demonstration  HOME EXERCISE PROGRAM: Access Code: [facility] URL: [email or web address] Date: 07/05/2024 Prepared by: Mliss  Exercises - Seated Transversus Abdominis Bracing  - 1 x daily - 7 x weekly - 2 sets - 10 reps - Seated Hamstring Stretch  - 1 x daily - 7 x weekly - 2 reps - 20 sec hold - Seated Piriformis Stretch  - 1 x daily - 7 x weekly - 2 reps - 20 sec hold - Single Leg Stance with Support  - 1 x daily - 7 x weekly - 2 reps - 20 sec hold - Shoulder External Rotation and Scapular Retraction with Resistance  - 1 x daily - 7 x weekly - 2 sets - 10 reps - Standing Shoulder Horizontal Abduction with Resistance  - 1 x daily - 7 x weekly - 2 sets - 10 reps - Standing Shoulder Row with Anchored Resistance  - 1 x daily - 7 x weekly - 2 sets - 10 reps - Shoulder extension with resistance - Neutral  - 1 x daily - 7 x weekly - 2 sets - 10 reps - Single Leg Balance with Clock Reach  - 1 x daily - 7 x weekly - 10 reps - Standing 'L' Stretch at Counter  - 1 x daily -  7 x weekly - 2 reps - 20 sec hold - Resisted Sit-to-Stand With Dumbbell at Chest  - 1 x daily - 3 x weekly - 2 sets - 10 reps - Side Step Overs with Cones and Unilateral Counter Support  - 1 x daily - 3 x weekly - 2 sets - 10 reps - Standing 3-Way Leg Reach with Resistance at Ankles and Counter Support  - 1 x daily - 3 x weekly - 2 sets - 10 reps - Side Stepping with Resistance at Ankles  - 1 x daily - 3 x weekly - 2 sets - 10 reps - Forward Monster Walk with Resistance at Ankles and Counter Support  - 1 x daily - 3 x weekly - 2 sets - 10 reps - Squat with Chair Touch  - 1 x daily - 3 x weekly - 2 sets - 10 reps  ASSESSMENT:  CLINICAL IMPRESSION:  ***  OBJECTIVE IMPAIRMENTS: decreased balance, decreased strength, increased muscle spasms, impaired flexibility, postural dysfunction, and pain.   ACTIVITY LIMITATIONS: lifting, standing, and stairs  PARTICIPATION LIMITATIONS: driving, community activity, and yard  work  PERSONAL FACTORS: Time since onset of injury/illness/exacerbation and 1-2 comorbidities: OA, right hip arthroplasty are also affecting patient's functional outcome.   REHAB POTENTIAL: Good  CLINICAL DECISION MAKING: Stable/uncomplicated  EVALUATION COMPLEXITY: Low   GOALS: Goals reviewed with patient? Yes  SHORT TERM GOALS: Target date: 06/16/2024  Patient will be independent with initial HEP. Baseline: Goal status: Met on 05/31/24  2.  Patient will demonstrate and verbalize knowledge of proper lifting techniques to allow him to lift suitcases for travel. Baseline:  Goal status: Ongoing   LONG TERM GOALS: Target date: 07/21/2024  Patient will be independent with advanced HEP to allow for self progression after discharge. Baseline:  Goal status: INITIAL  2.  Patient will improve modified Oswestry to not greater than 5% to demonstrate improved functional mobility. Baseline: 18% Goal status: IN PROGRESS 07/05/24  8%   3.  Patient will increase right hip strength to [facility] to allow him to navigate stairs with reciprocal pattern without difficulty/discomfort. Baseline:  Goal status: MET 07/05/24  4.  Patient will increase bilateral single leg stance to greater than 10 seconds to decrease patients fall risk. Baseline: right- 1.53 sec, left- 1.40 sec Goal status: IN PROGRESS 07/03/24  5.  Patient will report ability to ambulate over various unlevel surfaces for desired length (at least greater than 30 minutes) without a loss of balance to allow patient to travel and sight-see. Baseline:  Goal status: INITIAL   PLAN:  PT FREQUENCY: 1-2x/week  PT DURATION: 8 weeks  PLANNED INTERVENTIONS: 97164- PT Re-evaluation, 97750- Physical Performance Testing, 97110-Therapeutic exercises, 97530- Therapeutic activity, [id number]- Neuromuscular re-education, 97535- Self Care, [id number]- Manual therapy, [id number]- Gait training, [phone]- Canalith repositioning, [id number]- Aquatic Therapy, [phone]- Electrical  stimulation (unattended), [phone]- Electrical stimulation (manual), [id number]- Ultrasound, [id number]- Traction (mechanical), [id number]- Ionotophoresis 4mg /ml Dexamethasone , 79439 (1-2 muscles), [id number] (3+ muscles)- Dry Needling, Patient/Family education, Balance training, Stair training, Taping, Joint mobilization, Joint manipulation, Spinal manipulation, Spinal mobilization, Cryotherapy, and Moist heat.  PLAN FOR NEXT SESSION: continue with side step, monster walk and balance   Mliss Cummins, PT  07/18/24, 8:44 PM  [facility] [address] [place], [place] [address] Phone # [phone] Fax [phone]

## 2024-07-19 ENCOUNTER — Ambulatory Visit: Admitting: Physical Therapy

## 2024-07-19 DIAGNOSIS — M5459 Other low back pain: Secondary | ICD-10-CM | POA: Diagnosis not present

## 2024-07-19 DIAGNOSIS — M6281 Muscle weakness (generalized): Secondary | ICD-10-CM | POA: Diagnosis not present

## 2024-07-19 DIAGNOSIS — R252 Cramp and spasm: Secondary | ICD-10-CM | POA: Diagnosis not present

## 2024-07-19 DIAGNOSIS — R293 Abnormal posture: Secondary | ICD-10-CM | POA: Diagnosis not present

## 2024-07-19 DIAGNOSIS — R262 Difficulty in walking, not elsewhere classified: Secondary | ICD-10-CM | POA: Diagnosis not present

## 2024-07-19 DIAGNOSIS — R2689 Other abnormalities of gait and mobility: Secondary | ICD-10-CM

## 2024-07-19 NOTE — Therapy (Addendum)
 OUTPATIENT PHYSICAL THERAPY TREATMENT NOTE/RECERTIFICATION   Patient Name: [name] MRN: [id number] DOB:[birth year], 78 y.o., male Today's Date: 07/19/2024 Progress Note Reporting Period 05/26/24 to 07/19/24  See note below for Objective Data and Assessment of Progress/Goals.      END OF SESSION:  PT End of Session - 07/19/24 0931     Visit Number 9    Date for PT Re-Evaluation 10/11/24    Authorization Type [employer] 05/26/2024-07/21/2024-auth#[id number]  submittted for new re-auth    Authorization - Visit Number 9    Authorization - Number of Visits 16    Progress Note Due on Visit 10    PT Start Time 0931    PT Stop Time 1013    PT Time Calculation (min) 42 min    Activity Tolerance Patient tolerated treatment well               Past Medical History:  Diagnosis Date   Abscess    right posterior neck   Anticoagulation adequate 06/07/2019   Overview:  SSS, paroxAFib/Flutter, CHADSvasc= 3 age, h/o cva; chronic OAC Xarelto    Atrial fibrillation and flutter (HCC) 01/01/2015   Noted 2016. Xarelto     BPH associated with nocturia 08/14/2014   Finasteride , flomax .  0-3x nocturia. PSA to 14-->eval urology [facility]. PSA trended back down to about 2.6 per records in 2015 then placed on finasteride - had significant voiding issues when spiked to 14- improved on meds    Bradycardia    CAD (coronary artery disease) 05/26/2016   Coronary CTA_ 50-75% D1 off CT report    Cardiac pacemaker in situ 12/28/2009   SA node dysfunction as cause    Chest pressure 12/01/2015   Overview:  atypical CP, [facility] nuclear perfusion imaging w/ no inarct, no ischemia, EF-40% NICM   Chronic systolic heart failure (HCC) 12/01/2015   Overview:  [facility] nuclear perfusion imaging study, no inarct, no ischemia, EF-40% NICM   CVA (cerebral vascular accident) (HCC)    Double vision 07/04/2018   Elevated LFTs    Erectile dysfunction 01/30/2015   cialis  prn    External hemorrhoids    Fitting or adjustment of  cardiac pacemaker 06/07/2019   Overview:  [employer] DR [id number], RA and RV leads [id number]:  [id number] and [id number]   Former smoker 01/30/2015   Former smoker. 20 pack years quit 92. Patient declined AAA screen.     GERD (gastroesophageal reflux disease)    Gout 11/11/2009   Qualifier: Diagnosis of  By: Gardenia Pao     History of nuclear stress test 06/07/2019   Overview:  [facility], no inarct, no ischemia, EF-40% NICM   HTN (hypertension)    Hyperglycemia 06/10/2017   110s CBG 2018   Hyperlipidemia 07/12/2007   Crestor  20mg  daily with some myalgias; fish oil. Trig 200-500 despite this. LDL ok.        Insomnia 01/30/2015   ambien  5mg  prn. May refill.     Ischemic optic neuropathy    Left chest pressure 03/19/2015   Other and unspecified hyperlipidemia    Sinoatrial node dysfunction (HCC)    Spinal stenosis    Trigger finger, right middle finger 03/01/2018   Past Surgical History:  Procedure Laterality Date   ABSCESS DRAINAGE     righ tposterior neck   CARDIAC CATHETERIZATION  1996   CHOLECYSTECTOMY  02/07/2003   COLONOSCOPY  06/23/2004   EYE SURGERY     left eye   left ingunial hernia  06/08/2011   PACEMAKER INSERTION  PPM GENERATOR CHANGEOUT N/A 07/12/2024   Procedure: PPM GENERATOR CHANGEOUT;  Surgeon: Waddell Danelle ORN, MD;  Location: [facility] INVASIVE CV LAB;  Service: Cardiovascular;  Laterality: N/A;   right hip replacement     2012 Dr. 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 06/07/2019   Palpitations 06/07/2019   History of nuclear stress test 06/07/2019   S/P cardiac cath 06/07/2019   Non-occlusive coronary artery disease  06/07/2019   Double vision 07/04/2018   Trigger finger, right middle finger 03/01/2018   Hyperglycemia 06/10/2017   Solitary pulmonary nodule 05/26/2016   CAD (coronary artery disease) 05/26/2016   Nonischemic cardiomyopathy (HCC) 04/23/2016   Chronic systolic heart failure (HCC) 12/01/2015   Chest pressure 12/01/2015   Left chest pressure 03/19/2015   Insomnia 01/30/2015   Erectile dysfunction 01/30/2015   Cerebral infarction (HCC) 01/30/2015   Former smoker 01/30/2015   Atrial fibrillation and flutter (HCC) 01/01/2015   Allergic rhinitis 10/11/2014   BPH associated with nocturia 08/14/2014   Ventricular tachycardia, non-sustained (HCC) 01/25/2013   Sinoatrial node dysfunction (HCC)    SPINAL STENOSIS, LUMBAR 08/22/2010   GERD 03/17/2010   Cardiac pacemaker in situ 12/28/2009   Gout 11/11/2009   Hyperlipidemia 07/12/2007   NEUROPATHY, ISCHEMIC OPTIC 06/02/2007   Essential hypertension 06/02/2007    PCP: [name]., MD  REFERRING PROVIDER: [name], DO  REFERRING DIAG: M54.42,M54.41,G89.29 (ICD-10-CM) - Chronic bilateral low back pain with bilateral sciatica M51.362 (ICD-10-CM) - Degeneration of intervertebral disc of lumbar region with discogenic back pain and lower extremity pain  Rationale for Evaluation and Treatment: Rehabilitation  THERAPY DIAG:  Other low back pain  Other abnormalities of gait and mobility  Difficulty in walking, not elsewhere classified  Muscle weakness (generalized)  ONSET DATE: February 2024 first visit to Dr Leonce  SUBJECTIVE:                                                                                                                                                                                           SUBJECTIVE STATEMENT: I got a new pacemaker last Wednesday (no push, lifting on left). Going on a trip in September. I'd like to continue with therapy.  My back gets sore sometimes but not excruciating.  My balance  is terrible.  Uncomfortable going up and down steps without a  handrail.  Uneven surface is uncomfortable, Like grass or cobblestone.  I'd like to work on this.   PERTINENT HISTORY:  [name] frequently (next upcoming trip 7/14 for a [place] cruise) OA, trigger fingers, right hip arthroplasty, pacemaker CVA 30 years ago affecting L side  PAIN:  Are you having pain? Yes: NPRS scale: 0-1/10 Pain location: low back Pain description: discomfort Aggravating factors: worst first thing in the morning or after lying on his couch, swimming Relieving factors: medication, movement  PRECAUTIONS: ICD/Pacemaker  RED FLAGS: None   WEIGHT BEARING RESTRICTIONS: No  FALLS:  Has patient fallen in last 6 months? Yes. Number of falls 1 fall.  Tripped over a chair while traveling at [place]  LIVING ENVIRONMENT: Lives with: lives with their spouse Lives in: House/apartment Stairs: one story Has following equipment at home: None  OCCUPATION: Retired  PLOF: Independent and Leisure: traveling (many upcoming trips planned), swimming  PATIENT GOALS: To feel better and not have to be reliant on pain medication as much and improve balance and flexibility.  NEXT MD VISIT: Follow up with Dr Leonce as needed  OBJECTIVE:  Note: Objective measures were completed at Evaluation unless otherwise noted.  DIAGNOSTIC FINDINGS:  Lumbar CT Scan on 02/04/2023: IMPRESSION: Multilevel degenerative changes which are most severe at the L3-4 level with significant spinal stenosis. No acute traumatic abnormalities identified.  PATIENT SURVEYS:  Modified Oswestry:  MODIFIED OSWESTRY DISABILITY SCALE   Date: 05/26/2024 Score  Pain intensity 2 =  Pain medication provides me with complete relief from pain.  2. Personal care (washing, dressing, etc.) 0 =  I can take care of myself normally without causing increased pain.  3. Lifting 2 = Pain prevents me from lifting heavy weights off the floor, activities (eg. sports,  dancing). but I can manage if the weights are conveniently positioned (3) Pain prevents me from going out very often. (eg, on a table).  4. Walking 0 = Pain does not prevent me from walking any distance  5. Sitting 0 =  I can sit in any chair as long as I like.  6. Standing 1 =  I can stand as long as I want but, it increases my pain.  7. Sleeping 1 = I can sleep well only by using pain medication.  8. Social Life 0 = My social life is normal and does not increase my pain.  9. Traveling 2 =  My pain restricts my travel over 2 hours.  10. Employment/ Homemaking 1 = My normal homemaking/job activities increase my pain, but I can still perform all that is required of me  Total 9 / 50 = 18.0 %   Interpretation of scores: Score Category Description  0-20% Minimal Disability The patient can cope with most living activities. Usually no treatment is indicated apart from advice on lifting, sitting and exercise  21-40% Moderate Disability The patient experiences more pain and difficulty with sitting, lifting and standing. Travel and social life are more difficult and they may be disabled from work. Personal care, sexual activity and sleeping are not grossly affected, and the patient can usually be managed by conservative means  41-60% Severe Disability Pain remains the main problem in this group, but activities of daily living are affected. These patients require a detailed investigation  61-80% Crippled Back pain impinges on all aspects of the patient's life. Positive intervention is required  81-100% Bed-bound  These patients are either bed-bound or exaggerating their symptoms  Bluford BRAVO, Zoe DELENA Karon DELENA, et  al. Surgery versus conservative management of stable thoracolumbar fracture: the PRESTO feasibility RCT. [place]): [employer]; 2021 Nov. [facility] Technology Assessment, No. 25.62.) Appendix 3, Oswestry Disability Index category descriptors. Available from:  FindJewel[employer].cz  Minimally Clinically Important Difference (MCID) = 12.8%   8/6/2 ODI 4/50 = 8%  COGNITION: Overall cognitive status: Within functional limits for tasks assessed     SENSATION: Patient states that he has a neuropathy problem in his feet, but it has improved since starting Gabapentin   MUSCLE LENGTH: Hamstrings: increased tightness bilaterally Piriformis:  tightness bilat  POSTURE: rounded shoulders and forward head   LUMBAR ROM:   Eval:  Decreased by approx 50%  LOWER EXTREMITY ROM:     WFL  LOWER EXTREMITY MMT:    Eval:   LE grossly WNL, but grossly 4+ to 5-/5 in right hip Core strength grossly 4-/5 throughout  LUMBAR SPECIAL TESTS:  Slump test: Negative  FUNCTIONAL TESTS:  Eval: 5 times sit to stand: 11.87 sec Single Leg Stance:  right- 1.53 sec, left- 1.40 sec  GAIT: Distance walked: >500 ft Assistive device utilized: None Level of assistance: Complete Independence Comments: States that he has more difficulty with balance on unlevel surfaces (like grass) than firm surfaces.     MINI-BESTest: Balance Evaluation Systems Test ANTICIPATORY: SIT TO STAND: (2) normal without use of hands and stabilizes independently     (1) Moderate comes to stand WITH use of hands on 1st attempt    (0) Severe: unable to stand up from chair without assistance OR needs several attempts with use of   hands Score:  2  2. RISE TO TOES: feet shoulder width apart. Hands on hips.  Rise as high as you can onto your toes. Try to hold this pose for 3 sec                          (2) stable for 3s with maximum height    (1) heels up, but not full range (smaller than when holding hands) OR noticeable instability for 3 sec                           (0) < or equat to 3 s  Score:  1  3. STAND ON ONE LEG: look straight ahead. Hands on hips. Lift your leg off the ground.     Left:  trial 1 __3__ trial 2__6_                                     Right: Trial 1:___1   Trial 2:____3___   (2) 20 s       (2)  20 s                (1) < 20      (1) < 20 s   (0) Unable      (0) unable Score (use the worst side):  1  REACTIVE POSTURAL CONTROL 4. COMPENSATORY STEPPING CORRECTION FORWARD stand with feet shoulder width apart, arms at your sides. Lean forward against my hands beyond your forward limits.  When I let go, do whatever is necessary, including taking a step, to avoid a fall   (2) recovers independently  with a single, large step, to avoid a fall   (1) more than one step used to recover equilibrium   (  0) No step OR would fall if not caught Score:  0  5. COMPENSATORY STEPPING CORRECTION BACKWARD: Lean backward against my hands beyond your backward limits.  When I let go, do whatever is necessary, including a step to avoid a fall. (2) recovers independently  with a single, large step, to avoid a fall   (1) more than one step used to recover equilibrium   (0) No step OR would fall if not caught Score:  0  6. COMPENSATORY STEPPING LATERAL: Lean into my hand beyond your sideways limit.  When I let go, do whatever is necessary, including taking a step, to avoid a fall. Left           Right   (2) recovers independently with 1 step (crossover or lateral OK)   (1) several steps to recover equilibrium   (0) falls or cannot step Score:     0  (use the side with the lowest score)  SENSORY ORIENTATION 7. STANCE FEET TOGETHER EYES OPEN  firm surface Be as stable and still as possible until I say stop   (2) 30s   (1) < 30 s   (0) unable Score:  2  8. STANCE ON FOAM EYES CLOSED feet together, eyes closed, hands on hips. Be as stable and still as you can until I stay stop.  I will start timing when you close your eyes. Time in seconds:      (2) 30s   (1) <30 s   (0) unable Score:  1  9. INCLINE EYES CLOSED: Stand on incline with feet shoulder width apart, arms down at your sides.  I will start timing when you close your eyes   (2)  Stands 30 s and aligns with gravity   (1) stands < 30 s OR aligns with surface   (0) unable Score:  1  DYNAMIC GAIT 10. CHANGE IN GAIT SPEED: begin walking at your normal speed, when I tell you fast, walk as fast as you can, when I say slow walk very slowly   (2) significantly changes walking speed without imbalance   (1) unable to change walking speed or signs of imbalance   (0) unable to achieve significant change in walking speed AND signes of imbalance Score: 2  11. WALK WITH HEAD TURNS HORIZONTAL: begin walking at normal speed, when I say right, turn your head to the right, when I say left turn your head to the left.  Try to keep yourself walking in a straight line   (2) performs head turns with no change in gait speed and good balance   (1) performs head turns with reduction in gait speed   (0)  performs head turns with imbalance Score:  2  12. WALK WITH PIVOT TURNS: begin walking at your normal speed.  When I tell you to turn and stop, turn as quickly as you can, face the opposite direction and stop.  After the turn, your feet should be close together   (2) turns with feet close FAST in 3 steps with good balance   (1) turns with feet close SLOW > 4 steps with good balance   (0) cannot turn with feet close at any speed without imbalance Score:2  13. STEP OVER OBSTACLES:  ( 9 inch height 10 feet away from subject) begin walking at your normal speed.  When you get to the box, step over it, not around it and keep walking   (2) Able to step over box with  minimal change of gait speed and good balance   (1) Steps over box but touches box OR displays cautious behavior by slowing gait   (0) Unable to step over box OR steps around box Score:  2  14. TIMED UP AND GO NORMAL AND WITH DUAL TASK: 3 METER WALK: When I say go walk at your your normal speed across the tape, turn around and come back to sit in the chair   Time: Count backwards by 3s starting at    20.  When I say go, stand  up from the chair, walk at your normal speed across the tape on the floor, turn around, come back to sit in the chair.  Continue counting backwards the entire time.  Time:   (2) No noticeable change in sitting, standing or walking while backward counting when compared to TUG without dual task   (1) Dual task affects either counting OR walking (>10%)    (0) stops counting while walking OR stops walking while counting If gait speed slows more than 10% between TUG without and with dual task, the score should be decreased by a point Score: 1  Total score:       17    /28      TREATMENT DATE:  07/19/24: Review of current status including new pacemaker put in last week Review of progress toward goals  Mini best test as above:   17  /28 Wall pull aways with back to the wall and using anterior tib muscles and toe extensors to pull body off the wall 2x10 Staggered stance next to the barre:  retro stepping with weight shift forward and back 2x10 needs intermittent UE support to recover balance  (discussed for home to practice next to counter or in a corner for safety)  (will add to HEP next time)  07/05/2024: ODI, Assessed stairs Aura carry  with 20# KB x 2 laps ea hand length of hallway Side stepping yellow loop at [employer] walking with yellow loop at barre  3 way step yellow loop 2 x 5 B  Seated clam red x 20 not too challenging  S/L Red clam x 30 - not very challenging Sit to stand holding 5# kettlebell:  x 10  Squat to mat + 2 foam 2x10 Tandem walking - challenging Tandem stance  - multiple reps    07/03/2024: Nustep level 5 x5 min with PT present to discuss status Seated hamstring stretch 3x20 sec bilat Seated piriformis stretch 2x20 sec bilat Sit to/from stand holding 5# kettlebell:  x 10  Gait: intermittent LOB, increased BOS Tandem walking - challenging Tandem stance  - multiple reps  Split stance on 8 inch step balance SLS Side stepping yellow loop at barre Monster  walking with yellow loop at barre Seated clam yellow x 20 (increase resistance)  06/09/2024: Nustep level 5 x5 min with PT present to discuss status Seated hamstring stretch 2x20 sec bilat Seated piriformis stretch 2x20 sec bilat Sit to/from stand holding 5# kettlebell:  x 10 with chest press, x 10 with overhead press Seated modified Russian twist with 5# kettle bell 2x10 Standing shoulder rows 10# and 15# 1x10 ea Standing B shouder ext green x 20 in split stance Resisted walking 10# 4 way (CGA with left direction) Standing single leg clock tap with UE support x10 bilat Hurdle step overs 2x10 B challenging on R Standing B counter stretch 2x10 sec ea  Stair taps 1x10  PATIENT EDUCATION:  Education details: Issued HEP Person educated: Patient Education method: Explanation, Facilities manager, and Handouts Education comprehension: verbalized understanding and returned demonstration  HOME EXERCISE PROGRAM: Access Code: [facility] URL: [email or web address] Date: 07/05/2024 Prepared by: Mliss  Exercises - Seated Transversus Abdominis Bracing  - 1 x daily - 7 x weekly - 2 sets - 10 reps - Seated Hamstring Stretch  - 1 x daily - 7 x weekly - 2 reps - 20 sec hold - Seated Piriformis Stretch  - 1 x daily - 7 x weekly - 2 reps - 20 sec hold - Single Leg Stance with Support  - 1 x daily - 7 x weekly - 2 reps - 20 sec hold - Shoulder External Rotation and Scapular Retraction with Resistance  - 1 x daily - 7 x weekly - 2 sets - 10 reps - Standing Shoulder Horizontal Abduction with Resistance  - 1 x daily - 7 x weekly - 2 sets - 10 reps - Standing Shoulder Row with Anchored Resistance  - 1 x daily - 7 x weekly - 2 sets - 10 reps - Shoulder extension with resistance - Neutral  - 1 x daily - 7 x weekly - 2 sets - 10 reps - Single Leg Balance with Clock Reach  - 1 x daily - 7 x weekly - 10 reps - Standing 'L' Stretch at Counter  - 1 x daily - 7 x weekly - 2 reps - 20 sec hold -  Resisted Sit-to-Stand With Dumbbell at Chest  - 1 x daily - 3 x weekly - 2 sets - 10 reps - Side Step Overs with Cones and Unilateral Counter Support  - 1 x daily - 3 x weekly - 2 sets - 10 reps - Standing 3-Way Leg Reach with Resistance at Ankles and Counter Support  - 1 x daily - 3 x weekly - 2 sets - 10 reps - Side Stepping with Resistance at Ankles  - 1 x daily - 3 x weekly - 2 sets - 10 reps - Forward Monster Walk with Resistance at Ankles and Counter Support  - 1 x daily - 3 x weekly - 2 sets - 10 reps - Squat with Chair Touch  - 1 x daily - 3 x weekly - 2 sets - 10 reps  ASSESSMENT:  CLINICAL IMPRESSION: The patient reports his back pain is much improved since start of care and he would like to shift PT focus to improving his balance.  Per mini-BEST test he has dynamic balance deficits with sensory orientation particularly with a compliant surface, reactionary postural control and uses compensatory hip strategy rather than ankle strategy.  He tends to lose his balance in the posterior direction. The patient would benefit from a continuation of skilled PT for a further progression of balance needed for  functional mobility and safety with upcoming travel.  Will continue to update and promote independence in a HEP needed for a return to the highest functional level possible with ADLs.     OBJECTIVE IMPAIRMENTS: decreased balance, decreased strength, increased muscle spasms, impaired flexibility, postural dysfunction, and pain.   ACTIVITY LIMITATIONS: lifting, standing, and stairs  PARTICIPATION LIMITATIONS: driving, community activity, and yard work  PERSONAL FACTORS: Time since onset of injury/illness/exacerbation and 1-2 comorbidities: OA, right hip arthroplasty are also affecting patient's functional outcome.   REHAB POTENTIAL: Good  CLINICAL DECISION MAKING: Stable/uncomplicated  EVALUATION COMPLEXITY: Low   GOALS: Goals reviewed with patient? Yes  SHORT TERM GOALS: Target  date:  06/16/2024  Patient will be independent with initial HEP. Baseline: Goal status: Met on 05/31/24  2.  Patient will demonstrate and verbalize knowledge of proper lifting techniques to allow him to lift suitcases for travel. Baseline:  Goal status: Ongoing   LONG TERM GOALS: Target date: 10/11/2024    Patient will be independent with advanced HEP to allow for self progression after discharge. Baseline:  Goal status: INITIAL  2.  Patient will improve modified Oswestry to not greater than 5% to demonstrate improved functional mobility. Baseline: 18% Goal status: IN PROGRESS 07/05/24  8%   3.  Patient will increase right hip strength to [facility] to allow him to navigate stairs with reciprocal pattern without difficulty/discomfort. Baseline:  Goal status: MET 07/05/24  4.  Patient will increase bilateral single leg stance to greater than 10 seconds to decrease patients fall risk. Baseline: right- 1.53 sec, left- 1.40 sec Goal status: IN PROGRESS 07/03/24  5.  Patient will report ability to ambulate over various unlevel surfaces for desired length (at least greater than 30 minutes) without a loss of balance to allow patient to travel and sight-see. Baseline:  Goal status: ongoing  6 Patient will improve dynamic balance by using an ankle strategy in order to reduce loss of balance in the posterior direction New  7. Improved mini- best score to 23/28 indicating improved dynamic balance and decreased risk of falls  PLAN:  PT FREQUENCY: 2x/week  PT DURATION: 12 weeks  (pt will be traveling for a few weeks in September)  PLANNED INTERVENTIONS: 02835- PT Re-evaluation, 97750- Physical Performance Testing, 97110-Therapeutic exercises, 97530- Therapeutic activity, [id number]- Neuromuscular re-education, 97535- Self Care, 02859- Manual therapy, [id number]- Gait training, [phone]- Canalith repositioning, [id number]- Aquatic Therapy, [phone]- Electrical stimulation (unattended), [phone]- Electrical stimulation  (manual), [id number]- Ultrasound, [id number]- Traction (mechanical), [id number]- Ionotophoresis 4mg /ml Dexamethasone , 79439 (1-2 muscles), [id number] (3+ muscles)- Dry Needling, Patient/Family education, Balance training, Stair training, Taping, Joint mobilization, Joint manipulation, Spinal manipulation, Spinal mobilization, Cryotherapy, and Moist heat.  PLAN FOR NEXT SESSION: continue with side step, monster walk; dynamic balance (uneven surface, reactive balance);  add anterior tibialis activation ex's to HEP (back to the wall and pull off the wall using ankle strategy, staggered stance rocking for ankle strategy    Glade Pesa, PT 07/19/24 9:45 PM Phone: [phone] Fax: [phone]  [facility] [address] [place], [place] [address] Phone # [phone] Fax [phone]

## 2024-07-23 NOTE — Progress Notes (Unsigned)
  Electrophysiology Office Note:   Date:  07/24/2024  ID:  [name], [name] 11-Oct-1946, MRN [id number]  Primary Cardiologist: None Primary Heart Failure: None Electrophysiologist: Danelle Birmingham, MD       History of Present Illness:   [name] is a 78 y.o. male with h/o SND s/p PPM, AF / AFL, non-obs CAD, HTN, HLD, former smoker, CVA, seen today for routine electrophysiology follow-up s/p generator change.  Since last being seen in our clinic the patient reports doing very well post generator change. He has not had any device related concerns.  He is ready to get back to normal activities > swimming etc.   He denies chest pain, palpitations, dyspnea, PND, orthopnea, nausea, vomiting, dizziness, syncope, edema, weight gain, or early satiety.    Review of systems complete and found to be negative unless listed in HPI.    EP Information / Studies Reviewed:    EKG is not ordered today. EKG from 07/07/24 reviewed which showed AP with prolonged AV conduction, 60 bpm       PPM Interrogation-  reviewed in detail today,  See PACEART report.  Device History: Medtronic Barrister's clerk (previously had Biotronik generator) PPM implanted 09/15/2002 for Sinus Node Dysfunction Generator Change > 01/09/11, 07/12/24 (changed to MDT, MDT leads)  Risk Assessment/Calculations:    CHA2DS2-VASc Score = 7   This indicates a 11.2% annual risk of stroke. The patient's score is based upon: CHF History: 1 HTN History: 1 Diabetes History: 0 Stroke History: 2 Vascular Disease History: 1 Age Score: 2 Gender Score: 0             Physical Exam:   VS:  BP 123/75   Pulse 67   Ht 5' 8 (1.727 m)   SpO2 95%   BMI 25.09 kg/m    Wt Readings from Last 3 Encounters:  07/12/24 165 lb (74.8 kg)  07/07/24 168 lb 4.8 oz (76.3 kg)  06/29/24 170 lb 12.8 oz (77.5 kg)     GEN: Well nourished, well developed in no acute distress NECK: No JVD; No carotid bruits CARDIAC: Regular rate and rhythm,  no murmurs, rubs, gallops, steri strips removed, PPM site with very small (quarter sized estimated) soft hematoma at the 11 o'clock position RESPIRATORY:  Clear to auscultation without rales, wheezing or rhonchi  ABDOMEN: Soft, non-tender, non-distended EXTREMITIES:  No edema; No deformity   ASSESSMENT AND PLAN:    SND s/p Medtronic PPM  -Normal PPM function -See Pace Art report -No changes today -steri-strips removed, incision wnl / healing well  -discussed with patient to wait ~2 more weeks before getting back to swimming to allow pocket time to heal -alert noted for RV lead impedance but stable / consistent with prior impedance (had a Biotronik generator before this device change). Was 331 ohms on prior Biotronik device.  Paroxysmal AF / AFL  CHA2DS2-VASc 7 -0% burden on device  -OAC for stroke prophylaxis   Secondary Hypercoagulable State  -continue Xarelto , dose reviewed > most recent Cr Cl 45 mL/min -repeat BMP and determine if dose reduction warranted    Non-Obs CAD  HLD  -per Cardiology  -no anginal symptoms    Disposition:   Follow up with Dr. Birmingham as scheduled in November for 90 day post generator change   Signed, Daphne Barrack, NP-C, AGACNP-BC [facility] - Electrophysiology  07/24/2024, 1:02 PM

## 2024-07-24 ENCOUNTER — Ambulatory Visit: Attending: Pulmonary Disease | Admitting: Pulmonary Disease

## 2024-07-24 ENCOUNTER — Other Ambulatory Visit: Payer: Self-pay | Admitting: Sports Medicine

## 2024-07-24 ENCOUNTER — Encounter: Payer: Self-pay | Admitting: Pulmonary Disease

## 2024-07-24 VITALS — BP 123/75 | HR 67 | Ht 68.0 in

## 2024-07-24 DIAGNOSIS — I2510 Atherosclerotic heart disease of native coronary artery without angina pectoris: Secondary | ICD-10-CM

## 2024-07-24 DIAGNOSIS — G8929 Other chronic pain: Secondary | ICD-10-CM

## 2024-07-24 DIAGNOSIS — Z950 Presence of cardiac pacemaker: Secondary | ICD-10-CM

## 2024-07-24 DIAGNOSIS — I480 Paroxysmal atrial fibrillation: Secondary | ICD-10-CM

## 2024-07-24 DIAGNOSIS — M47816 Spondylosis without myelopathy or radiculopathy, lumbar region: Secondary | ICD-10-CM

## 2024-07-24 DIAGNOSIS — D6869 Other thrombophilia: Secondary | ICD-10-CM | POA: Diagnosis not present

## 2024-07-24 DIAGNOSIS — I495 Sick sinus syndrome: Secondary | ICD-10-CM

## 2024-07-24 DIAGNOSIS — M51362 Other intervertebral disc degeneration, lumbar region with discogenic back pain and lower extremity pain: Secondary | ICD-10-CM

## 2024-07-24 LAB — CUP PACEART INCLINIC DEVICE CHECK
Date Time Interrogation Session: 20250825130431
Implantable Lead Connection Status: 753985
Implantable Lead Connection Status: 753985
Implantable Lead Implant Date: 20031017
Implantable Lead Implant Date: 20031017
Implantable Lead Location: 753859
Implantable Lead Location: 753860
Implantable Lead Model: 5076
Implantable Lead Model: 5076
Implantable Pulse Generator Implant Date: 20250813

## 2024-07-24 NOTE — Patient Instructions (Signed)
 Medication Instructions:  Your physician recommends that you continue on your current medications as directed. Please refer to the Current Medication list given to you today.  *If you need a refill on your cardiac medications before your next appointment, please call your pharmacy*  Lab Work: BMET-TODAY If you have labs (blood work) drawn today and your tests are completely normal, you will receive your results only by: MyChart Message (if you have MyChart) OR A paper copy in the mail If you have any lab test that is abnormal or we need to change your treatment, we will call you to review the results.  Follow-Up: At [facility], you and your health needs are our priority.  As part of our continuing mission to provide you with exceptional heart care, our providers are all part of one team.  This team includes your primary Cardiologist (physician) and Advanced Practice Providers or APPs (Physician Assistants and Nurse Practitioners) who all work together to provide you with the care you need, when you need it.  Your next appointment:   As scheduled  Provider:   Danelle Birmingham, MD

## 2024-07-25 ENCOUNTER — Ambulatory Visit: Admitting: Physical Therapy

## 2024-07-25 ENCOUNTER — Ambulatory Visit: Payer: Self-pay

## 2024-07-25 ENCOUNTER — Encounter: Payer: Self-pay | Admitting: Physical Therapy

## 2024-07-25 DIAGNOSIS — R293 Abnormal posture: Secondary | ICD-10-CM | POA: Diagnosis not present

## 2024-07-25 DIAGNOSIS — R2689 Other abnormalities of gait and mobility: Secondary | ICD-10-CM

## 2024-07-25 DIAGNOSIS — M5459 Other low back pain: Secondary | ICD-10-CM

## 2024-07-25 DIAGNOSIS — R252 Cramp and spasm: Secondary | ICD-10-CM | POA: Diagnosis not present

## 2024-07-25 DIAGNOSIS — M6281 Muscle weakness (generalized): Secondary | ICD-10-CM | POA: Diagnosis not present

## 2024-07-25 DIAGNOSIS — R262 Difficulty in walking, not elsewhere classified: Secondary | ICD-10-CM | POA: Diagnosis not present

## 2024-07-25 LAB — BASIC METABOLIC PANEL WITH GFR
BUN/Creatinine Ratio: 16 (ref 10–24)
BUN: 18 mg/dL (ref 8–27)
CO2: 22 mmol/L (ref 20–29)
Calcium: 9.8 mg/dL (ref 8.6–10.2)
Chloride: 102 mmol/L (ref 96–106)
Creatinine, Ser: 1.15 mg/dL (ref 0.76–1.27)
Glucose: 103 mg/dL — ABNORMAL HIGH (ref 70–99)
Potassium: 4.3 mmol/L (ref 3.5–5.2)
Sodium: 141 mmol/L (ref 134–144)
eGFR: 66 mL/min/1.73 (ref >59)

## 2024-07-25 NOTE — Therapy (Signed)
 OUTPATIENT PHYSICAL THERAPY TREATMENT NOTE/RECERTIFICATION   Patient Name: [name] MRN: [id number] DOB:[birth year], 78 y.o., male Today's Date: 07/25/2024  END OF SESSION:  PT End of Session - 07/25/24 1152     Visit Number 10    Date for PT Re-Evaluation 10/11/24    Authorization Type [employer] MCR - 18 visits approved    Authorization Time Period 07/20/24 to 10/11/24    Authorization - Visit Number 1    Authorization - Number of Visits 18    Progress Note Due on Visit 19    PT Start Time 1153    PT Stop Time 1233    PT Time Calculation (min) 40 min    Activity Tolerance Patient tolerated treatment well    Behavior During Therapy [facility] for tasks assessed/performed                Past Medical History:  Diagnosis Date   Abscess    right posterior neck   Anticoagulation adequate 06/07/2019   Overview:  SSS, paroxAFib/Flutter, CHADSvasc= 3 age, h/o cva; chronic OAC Xarelto    Atrial fibrillation and flutter (HCC) 01/01/2015   Noted 2016. Xarelto     BPH associated with nocturia 08/14/2014   Finasteride , flomax .  0-3x nocturia. PSA to 14-->eval urology [facility]. PSA trended back down to about 2.6 per records in 2015 then placed on finasteride - had significant voiding issues when spiked to 14- improved on meds    Bradycardia    CAD (coronary artery disease) 05/26/2016   Coronary CTA_ 50-75% D1 off CT report    Cardiac pacemaker in situ 12/28/2009   SA node dysfunction as cause    Chest pressure 12/01/2015   Overview:  atypical CP, [facility] nuclear perfusion imaging w/ no inarct, no ischemia, EF-40% NICM   Chronic systolic heart failure (HCC) 12/01/2015   Overview:  [facility] nuclear perfusion imaging study, no inarct, no ischemia, EF-40% NICM   CVA (cerebral vascular accident) (HCC)    Double vision 07/04/2018   Elevated LFTs    Erectile dysfunction 01/30/2015   cialis  prn    External hemorrhoids    Fitting or adjustment of cardiac pacemaker 06/07/2019   Overview:  Biotronik Altha DR [id number],  RA and RV leads [id number]:  [id number] and [id number]   Former smoker 01/30/2015   Former smoker. 20 pack years quit 92. Patient declined AAA screen.     GERD (gastroesophageal reflux disease)    Gout 11/11/2009   Qualifier: Diagnosis of  By: Gardenia Pao     History of nuclear stress test 06/07/2019   Overview:  [facility], no inarct, no ischemia, EF-40% NICM   HTN (hypertension)    Hyperglycemia 06/10/2017   110s CBG 2018   Hyperlipidemia 07/12/2007   Crestor  20mg  daily with some myalgias; fish oil. Trig 200-500 despite this. LDL ok.        Insomnia 01/30/2015   ambien  5mg  prn. May refill.     Ischemic optic neuropathy    Left chest pressure 03/19/2015   Other and unspecified hyperlipidemia    Sinoatrial node dysfunction (HCC)    Spinal stenosis    Trigger finger, right middle finger 03/01/2018   Past Surgical History:  Procedure Laterality Date   ABSCESS DRAINAGE     righ tposterior neck   CARDIAC CATHETERIZATION  1996   CHOLECYSTECTOMY  02/07/2003   COLONOSCOPY  06/23/2004   EYE SURGERY     left eye   left ingunial hernia  06/08/2011   PACEMAKER INSERTION     PPM  GENERATOR CHANGEOUT N/A 07/12/2024   Procedure: PPM GENERATOR CHANGEOUT;  Surgeon: Waddell Danelle ORN, MD;  Location: [facility] INVASIVE CV LAB;  Service: Cardiovascular;  Laterality: N/A;   right hip replacement     2012 Dr. 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 06/07/2019   Palpitations 06/07/2019   History of nuclear stress test 06/07/2019   S/P cardiac cath 06/07/2019   Non-occlusive coronary artery disease 06/07/2019   Double vision 07/04/2018   Trigger finger, right middle  finger 03/01/2018   Hyperglycemia 06/10/2017   Solitary pulmonary nodule 05/26/2016   CAD (coronary artery disease) 05/26/2016   Nonischemic cardiomyopathy (HCC) 04/23/2016   Chronic systolic heart failure (HCC) 12/01/2015   Chest pressure 12/01/2015   Left chest pressure 03/19/2015   Insomnia 01/30/2015   Erectile dysfunction 01/30/2015   Cerebral infarction (HCC) 01/30/2015   Former smoker 01/30/2015   Atrial fibrillation and flutter (HCC) 01/01/2015   Allergic rhinitis 10/11/2014   BPH associated with nocturia 08/14/2014   Ventricular tachycardia, non-sustained (HCC) 01/25/2013   Sinoatrial node dysfunction (HCC)    SPINAL STENOSIS, LUMBAR 08/22/2010   GERD 03/17/2010   Cardiac pacemaker in situ 12/28/2009   Gout 11/11/2009   Hyperlipidemia 07/12/2007   NEUROPATHY, ISCHEMIC OPTIC 06/02/2007   Essential hypertension 06/02/2007    PCP: [name]., MD  REFERRING PROVIDER: [name], DO  REFERRING DIAG: M54.42,M54.41,G89.29 (ICD-10-CM) - Chronic bilateral low back pain with bilateral sciatica M51.362 (ICD-10-CM) - Degeneration of intervertebral disc of lumbar region with discogenic back pain and lower extremity pain  Rationale for Evaluation and Treatment: Rehabilitation  THERAPY DIAG:  Other low back pain  Other abnormalities of gait and mobility  Difficulty in walking, not elsewhere classified  Muscle weakness (generalized)  Balance disorder  ONSET DATE: February 2024 first visit to Dr Leonce  SUBJECTIVE:                                                                                                                                                                                           SUBJECTIVE STATEMENT: I got a new pacemaker last Wednesday (no push, lifting on left). Going on a trip in September. I'd like to continue with therapy.  My back gets sore sometimes but not excruciating.  My balance is terrible.  Uncomfortable going up and down steps  without a handrail.  Uneven surface is uncomfortable, Like grass or cobblestone.  I'd like to work on this.   PERTINENT HISTORY:  [name] (next upcoming trip 7/14 for a [place] cruise) OA, trigger fingers, right hip arthroplasty, pacemaker CVA 30 years ago affecting L side  PAIN:  Are you having pain? Yes: NPRS scale: 0-1/10 Pain location: low back Pain description: discomfort Aggravating factors: worst first thing in the morning or after lying on his couch, swimming Relieving factors: medication, movement  PRECAUTIONS: ICD/Pacemaker  RED FLAGS: None   WEIGHT BEARING RESTRICTIONS: No  FALLS:  Has patient fallen in last 6 months? Yes. Number of falls 1 fall.  Tripped over a chair while traveling at [place]  LIVING ENVIRONMENT: Lives with: lives with their spouse Lives in: House/apartment Stairs: one story Has following equipment at home: None  OCCUPATION: Retired  PLOF: Independent and Leisure: traveling (many upcoming trips planned), swimming  PATIENT GOALS: To feel better and not have to be reliant on pain medication as much and improve balance and flexibility.  NEXT MD VISIT: Follow up with Dr Leonce as needed  OBJECTIVE:  Note: Objective measures were completed at Evaluation unless otherwise noted.  DIAGNOSTIC FINDINGS:  Lumbar CT Scan on 02/04/2023: IMPRESSION: Multilevel degenerative changes which are most severe at the L3-4 level with significant spinal stenosis. No acute traumatic abnormalities identified.  PATIENT SURVEYS:  Modified Oswestry:  MODIFIED OSWESTRY DISABILITY SCALE   Date: 05/26/2024 Score  Pain intensity 2 =  Pain medication provides me with complete relief from pain.  2. Personal care (washing, dressing, etc.) 0 =  I can take care of myself normally without causing increased pain.  3. Lifting 2 = Pain prevents me from lifting heavy weights off the floor, activities (eg. sports, dancing). but I can manage if the weights are  conveniently positioned (3) Pain prevents me from going out very often. (eg, on a table).  4. Walking 0 = Pain does not prevent me from walking any distance  5. Sitting 0 =  I can sit in any chair as long as I like.  6. Standing 1 =  I can stand as long as I want but, it increases my pain.  7. Sleeping 1 = I can sleep well only by using pain medication.  8. Social Life 0 = My social life is normal and does not increase my pain.  9. Traveling 2 =  My pain restricts my travel over 2 hours.  10. Employment/ Homemaking 1 = My normal homemaking/job activities increase my pain, but I can still perform all that is required of me  Total 9 / 50 = 18.0 %   Interpretation of scores: Score Category Description  0-20% Minimal Disability The patient can cope with most living activities. Usually no treatment is indicated apart from advice on lifting, sitting and exercise  21-40% Moderate Disability The patient experiences more pain and difficulty with sitting, lifting and standing. Travel and social life are more difficult and they may be disabled from work. Personal care, sexual activity and sleeping are not grossly affected, and the patient can usually be managed by conservative means  41-60% Severe Disability Pain remains the main problem in this group, but activities of daily living are affected. These patients require a detailed investigation  61-80% Crippled Back pain impinges on all aspects of the patient's life. Positive intervention is required  81-100% Bed-bound  These patients are either bed-bound or exaggerating their symptoms  Bluford BRAVO, Zoe DELENA Britain  A, et al. Surgery versus conservative management of stable thoracolumbar fracture: the PRESTO feasibility RCT. [place] ([place]): [employer]; 2021 Nov. [facility] Technology Assessment, No. 25.62.) Appendix 3, Oswestry Disability Index category descriptors. Available from: FindJewelers.cz  Minimally Clinically  Important Difference (MCID) = 12.8%   8/6/2 ODI 4/50 = 8%  COGNITION: Overall cognitive status: Within functional limits for tasks assessed     SENSATION: Patient states that he has a neuropathy problem in his feet, but it has improved since starting Gabapentin   MUSCLE LENGTH: Hamstrings: increased tightness bilaterally Piriformis:  tightness bilat  POSTURE: rounded shoulders and forward head   LUMBAR ROM:   Eval:  Decreased by approx 50%  LOWER EXTREMITY ROM:     WFL  LOWER EXTREMITY MMT:    Eval:   LE grossly WNL, but grossly 4+ to 5-/5 in right hip Core strength grossly 4-/5 throughout  LUMBAR SPECIAL TESTS:  Slump test: Negative  FUNCTIONAL TESTS:  Eval: 5 times sit to stand: 11.87 sec Single Leg Stance:  right- 1.53 sec, left- 1.40 sec  GAIT: Distance walked: >500 ft Assistive device utilized: None Level of assistance: Complete Independence Comments: States that he has more difficulty with balance on unlevel surfaces (like grass) than firm surfaces.     MINI-BESTest: Balance Evaluation Systems Test ANTICIPATORY: SIT TO STAND: (2) normal without use of hands and stabilizes independently     (1) Moderate comes to stand WITH use of hands on 1st attempt    (0) Severe: unable to stand up from chair without assistance OR needs several attempts with use of   hands Score:  2  2. RISE TO TOES: feet shoulder width apart. Hands on hips.  Rise as high as you can onto your toes. Try to hold this pose for 3 sec                          (2) stable for 3s with maximum height    (1) heels up, but not full range (smaller than when holding hands) OR noticeable instability for 3 sec                           (0) < or equat to 3 s  Score:  1  3. STAND ON ONE LEG: look straight ahead. Hands on hips. Lift your leg off the ground.     Left:  trial 1 __3__ trial 2__6_                                    Right: Trial 1:___1   Trial 2:____3___   (2) 20 s       (2)  20 s                 (1) < 20      (1) < 20 s   (0) Unable      (0) unable Score (use the worst side):  1  REACTIVE POSTURAL CONTROL 4. COMPENSATORY STEPPING CORRECTION FORWARD stand with feet shoulder width apart, arms at your sides. Lean forward against my hands beyond your forward limits.  When I let go, do whatever is necessary, including taking a step, to avoid a fall   (2) recovers independently  with a single, large step, to avoid a fall   (1) more than one step used to recover  equilibrium   (0) No step OR would fall if not caught Score:  0  5. COMPENSATORY STEPPING CORRECTION BACKWARD: Lean backward against my hands beyond your backward limits.  When I let go, do whatever is necessary, including a step to avoid a fall. (2) recovers independently  with a single, large step, to avoid a fall   (1) more than one step used to recover equilibrium   (0) No step OR would fall if not caught Score:  0  6. COMPENSATORY STEPPING LATERAL: Lean into my hand beyond your sideways limit.  When I let go, do whatever is necessary, including taking a step, to avoid a fall. Left           Right   (2) recovers independently with 1 step (crossover or lateral OK)   (1) several steps to recover equilibrium   (0) falls or cannot step Score:     0  (use the side with the lowest score)  SENSORY ORIENTATION 7. STANCE FEET TOGETHER EYES OPEN  firm surface Be as stable and still as possible until I say stop   (2) 30s   (1) < 30 s   (0) unable Score:  2  8. STANCE ON FOAM EYES CLOSED feet together, eyes closed, hands on hips. Be as stable and still as you can until I stay stop.  I will start timing when you close your eyes. Time in seconds:      (2) 30s   (1) <30 s   (0) unable Score:  1  9. INCLINE EYES CLOSED: Stand on incline with feet shoulder width apart, arms down at your sides.  I will start timing when you close your eyes   (2) Stands 30 s and aligns with gravity   (1) stands < 30 s OR aligns with  surface   (0) unable Score:  1  DYNAMIC GAIT 10. CHANGE IN GAIT SPEED: begin walking at your normal speed, when I tell you fast, walk as fast as you can, when I say slow walk very slowly   (2) significantly changes walking speed without imbalance   (1) unable to change walking speed or signs of imbalance   (0) unable to achieve significant change in walking speed AND signes of imbalance Score: 2  11. WALK WITH HEAD TURNS HORIZONTAL: begin walking at normal speed, when I say right, turn your head to the right, when I say left turn your head to the left.  Try to keep yourself walking in a straight line   (2) performs head turns with no change in gait speed and good balance   (1) performs head turns with reduction in gait speed   (0)  performs head turns with imbalance Score:  2  12. WALK WITH PIVOT TURNS: begin walking at your normal speed.  When I tell you to turn and stop, turn as quickly as you can, face the opposite direction and stop.  After the turn, your feet should be close together   (2) turns with feet close FAST in 3 steps with good balance   (1) turns with feet close SLOW > 4 steps with good balance   (0) cannot turn with feet close at any speed without imbalance Score:2  13. STEP OVER OBSTACLES:  ( 9 inch height 10 feet away from subject) begin walking at your normal speed.  When you get to the box, step over it, not around it and keep walking   (2) Able to step  over box with minimal change of gait speed and good balance   (1) Steps over box but touches box OR displays cautious behavior by slowing gait   (0) Unable to step over box OR steps around box Score:  2  14. TIMED UP AND GO NORMAL AND WITH DUAL TASK: 3 METER WALK: When I say go walk at your your normal speed across the tape, turn around and come back to sit in the chair   Time: Count backwards by 3s starting at    20.  When I say go, stand up from the chair, walk at your normal speed across the tape on the  floor, turn around, come back to sit in the chair.  Continue counting backwards the entire time.  Time:   (2) No noticeable change in sitting, standing or walking while backward counting when compared to TUG without dual task   (1) Dual task affects either counting OR walking (>10%)    (0) stops counting while walking OR stops walking while counting If gait speed slows more than 10% between TUG without and with dual task, the score should be decreased by a point Score: 1  Total score:       17    /28      TREATMENT DATE:  07/25/24 Wall pull aways with back to the wall and using anterior tib muscles and toe extensors to pull body off the wall  Staggered stance at wall:  retro stepping with weight shift forward and back 2x10 needs intermittent UE support to recover balance Standing gastroc stretch 2x30 sec B kneeling ant tib stretch 2x30 sec - sitting on heels on mat table Seated ankle ROM all planes, circles and alphabet Second set of Staggered stance at wall:  retro stepping with weight shift forward and back 2x10 needs intermittent UE support to recover balance Agility ladder - multiple variations incorporating lateral, fwd, bwd and diagonal movements     07/19/24: Review of current status including new pacemaker put in last week Review of progress toward goals  Mini best test as above:   17  /28 Wall pull aways with back to the wall and using anterior tib muscles and toe extensors to pull body off the wall 2x10 Staggered stance next to the barre:  retro stepping with weight shift forward and back 2x10 needs intermittent UE support to recover balance  (discussed for home to practice next to counter or in a corner for safety)  (will add to HEP next time)  07/05/2024: ODI, Assessed stairs Aura carry  with 20# KB x 2 laps ea hand length of hallway Side stepping yellow loop at [employer] walking with yellow loop at barre  3 way step yellow loop 2 x 5 B  Seated clam red x 20 not  too challenging  S/L Red clam x 30 - not very challenging Sit to stand holding 5# kettlebell:  x 10  Squat to mat + 2 foam 2x10 Tandem walking - challenging Tandem stance  - multiple reps    07/03/2024: Nustep level 5 x5 min with PT present to discuss status Seated hamstring stretch 3x20 sec bilat Seated piriformis stretch 2x20 sec bilat Sit to/from stand holding 5# kettlebell:  x 10  Gait: intermittent LOB, increased BOS Tandem walking - challenging Tandem stance  - multiple reps  Split stance on 8 inch step balance SLS Side stepping yellow loop at barre Monster walking with yellow loop at barre Seated clam yellow x  20 (increase resistance)  06/09/2024: Nustep level 5 x5 min with PT present to discuss status Seated hamstring stretch 2x20 sec bilat Seated piriformis stretch 2x20 sec bilat Sit to/from stand holding 5# kettlebell:  x 10 with chest press, x 10 with overhead press Seated modified Russian twist with 5# kettle bell 2x10 Standing shoulder rows 10# and 15# 1x10 ea Standing B shouder ext green x 20 in split stance Resisted walking 10# 4 way (CGA with left direction) Standing single leg clock tap with UE support x10 bilat Hurdle step overs 2x10 B challenging on R Standing B counter stretch 2x10 sec ea  Stair taps 1x10      PATIENT EDUCATION:  Education details: Issued HEP Person educated: Patient Education method: Programmer, multimedia, Facilities manager, and Handouts Education comprehension: verbalized understanding and returned demonstration  HOME EXERCISE PROGRAM: Access Code: [facility] URL: [email or web address] Date: 07/25/2024 Prepared by: Mliss  Exercises - Seated Transversus Abdominis Bracing  - 1 x daily - 7 x weekly - 2 sets - 10 reps - Seated Hamstring Stretch  - 1 x daily - 7 x weekly - 2 reps - 20 sec hold - Seated Piriformis Stretch  - 1 x daily - 7 x weekly - 2 reps - 20 sec hold - Single Leg Stance with Support  - 1 x daily - 7 x weekly - 2  reps - 20 sec hold - Shoulder External Rotation and Scapular Retraction with Resistance  - 1 x daily - 7 x weekly - 2 sets - 10 reps - Standing Shoulder Horizontal Abduction with Resistance  - 1 x daily - 7 x weekly - 2 sets - 10 reps - Standing Shoulder Row with Anchored Resistance  - 1 x daily - 7 x weekly - 2 sets - 10 reps - Shoulder extension with resistance - Neutral  - 1 x daily - 7 x weekly - 2 sets - 10 reps - Single Leg Balance with Clock Reach  - 1 x daily - 7 x weekly - 10 reps - Standing 'L' Stretch at Counter  - 1 x daily - 7 x weekly - 2 reps - 20 sec hold - Resisted Sit-to-Stand With Dumbbell at Chest  - 1 x daily - 3 x weekly - 2 sets - 10 reps - Side Step Overs with Cones and Unilateral Counter Support  - 1 x daily - 3 x weekly - 2 sets - 10 reps - Standing 3-Way Leg Reach with Resistance at Ankles and Counter Support  - 1 x daily - 3 x weekly - 2 sets - 10 reps - Side Stepping with Resistance at Ankles  - 1 x daily - 3 x weekly - 2 sets - 10 reps - Forward Monster Walk with Resistance at Ankles and Counter Support  - 1 x daily - 3 x weekly - 2 sets - 10 reps - Squat with Chair Touch  - 1 x daily - 3 x weekly - 2 sets - 10 reps - Kneeling Plantarflexion Stretch  - 1 x daily - 3 x weekly - 1 sets - 3 reps - 30 sec hold - Supine Ankle Dorsiflexion and Plantarflexion AROM  - 1 x daily - 7 x weekly - 2 sets - 10 reps - Supine Ankle Inversion and Eversion AROM  - 1 x daily - 7 x weekly - 2 sets - 10 reps - Supine Ankle Circles  - 1 x daily - 7 x weekly - 2 sets - 10 reps - Seated Ankle Alphabet  -  1 x daily - 7 x weekly - 1 sets - 1 reps  ASSESSMENT:  CLINICAL IMPRESSION: Patient demonstrates decreased ankle flexibility L > R so we worked on this for improved ankle strategy with balance. Split stance is still challenging as was agility ladder for more dynamic balance challenges. Patient fatigued by end of session.    OBJECTIVE IMPAIRMENTS: decreased balance, decreased strength,  increased muscle spasms, impaired flexibility, postural dysfunction, and pain.   ACTIVITY LIMITATIONS: lifting, standing, and stairs  PARTICIPATION LIMITATIONS: driving, community activity, and yard work  PERSONAL FACTORS: Time since onset of injury/illness/exacerbation and 1-2 comorbidities: OA, right hip arthroplasty are also affecting patient's functional outcome.   REHAB POTENTIAL: Good  CLINICAL DECISION MAKING: Stable/uncomplicated  EVALUATION COMPLEXITY: Low   GOALS: Goals reviewed with patient? Yes  SHORT TERM GOALS: Target date: 06/16/2024  Patient will be independent with initial HEP. Baseline: Goal status: Met on 05/31/24  2.  Patient will demonstrate and verbalize knowledge of proper lifting techniques to allow him to lift suitcases for travel. Baseline:  Goal status: Ongoing   LONG TERM GOALS: Target date: 10/11/2024    Patient will be independent with advanced HEP to allow for self progression after discharge. Baseline:  Goal status: INITIAL  2.  Patient will improve modified Oswestry to not greater than 5% to demonstrate improved functional mobility. Baseline: 18% Goal status: IN PROGRESS 07/05/24  8%   3.  Patient will increase right hip strength to [facility] to allow him to navigate stairs with reciprocal pattern without difficulty/discomfort. Baseline:  Goal status: MET 07/05/24  4.  Patient will increase bilateral single leg stance to greater than 10 seconds to decrease patients fall risk. Baseline: right- 1.53 sec, left- 1.40 sec Goal status: IN PROGRESS 07/03/24  5.  Patient will report ability to ambulate over various unlevel surfaces for desired length (at least greater than 30 minutes) without a loss of balance to allow patient to travel and sight-see. Baseline:  Goal status: ongoing  6 Patient will improve dynamic balance by using an ankle strategy in order to reduce loss of balance in the posterior direction New  7. Improved mini- best score to 23/28  indicating improved dynamic balance and decreased risk of falls  PLAN:  PT FREQUENCY: 2x/week  PT DURATION: 12 weeks  (pt will be traveling for a few weeks in September)  PLANNED INTERVENTIONS: 02835- PT Re-evaluation, 97750- Physical Performance Testing, 97110-Therapeutic exercises, 97530- Therapeutic activity, [id number]- Neuromuscular re-education, 97535- Self Care, 02859- Manual therapy, [id number]- Gait training, [phone]- Canalith repositioning, [id number]- Aquatic Therapy, [phone]- Electrical stimulation (unattended), [phone]- Electrical stimulation (manual), [id number]- Ultrasound, [id number]- Traction (mechanical), [id number]- Ionotophoresis 4mg /ml Dexamethasone , [id number] (1-2 muscles), [id number] (3+ muscles)- Dry Needling, Patient/Family education, Balance training, Stair training, Taping, Joint mobilization, Joint manipulation, Spinal manipulation, Spinal mobilization, Cryotherapy, and Moist heat.  PLAN FOR NEXT SESSION: continue with agility ladder with bwd activities, side step, monster walk; dynamic balance (uneven surface, reactive balance);  add anterior tibialis activation ex's to HEP (back to the wall and pull off the wall using ankle strategy, staggered stance rocking for ankle strategy    Mliss Cummins, PT  07/25/24 12:50 PM Phone: [phone] Fax: [phone]  [facility] [address] [place], [place] [address] Phone # [phone] Fax [phone]

## 2024-07-26 NOTE — Telephone Encounter (Signed)
 Patient called asking why the medication was denied.  Please advise.

## 2024-07-27 ENCOUNTER — Other Ambulatory Visit: Payer: Self-pay | Admitting: Sports Medicine

## 2024-07-27 ENCOUNTER — Ambulatory Visit: Admitting: Pulmonary Disease

## 2024-07-27 ENCOUNTER — Ambulatory Visit

## 2024-07-27 DIAGNOSIS — R2689 Other abnormalities of gait and mobility: Secondary | ICD-10-CM

## 2024-07-27 DIAGNOSIS — M5459 Other low back pain: Secondary | ICD-10-CM

## 2024-07-27 DIAGNOSIS — R252 Cramp and spasm: Secondary | ICD-10-CM | POA: Diagnosis not present

## 2024-07-27 DIAGNOSIS — R262 Difficulty in walking, not elsewhere classified: Secondary | ICD-10-CM | POA: Diagnosis not present

## 2024-07-27 DIAGNOSIS — M47816 Spondylosis without myelopathy or radiculopathy, lumbar region: Secondary | ICD-10-CM

## 2024-07-27 DIAGNOSIS — R293 Abnormal posture: Secondary | ICD-10-CM

## 2024-07-27 DIAGNOSIS — M6281 Muscle weakness (generalized): Secondary | ICD-10-CM

## 2024-07-27 DIAGNOSIS — M51362 Other intervertebral disc degeneration, lumbar region with discogenic back pain and lower extremity pain: Secondary | ICD-10-CM

## 2024-07-27 DIAGNOSIS — G8929 Other chronic pain: Secondary | ICD-10-CM

## 2024-07-27 MED ORDER — CYCLOBENZAPRINE HCL 5 MG PO TABS: 5.0000 mg | ORAL_TABLET | Freq: Every day | ORAL | 0 refills | Status: AC | PRN

## 2024-07-27 NOTE — Telephone Encounter (Signed)
 Patient called again asking about the medication he called about yesterday. Please advise.

## 2024-07-27 NOTE — Therapy (Signed)
 OUTPATIENT PHYSICAL THERAPY TREATMENT NOTE/RECERTIFICATION   Patient Name: [name] MRN: [id number] DOB:[birth year], 78 y.o., male Today's Date: 07/27/2024  END OF SESSION:  PT End of Session - 07/27/24 1150     Visit Number 11    Date for PT Re-Evaluation 10/11/24    Authorization Type [employer] MCR - 18 visits approved    Authorization Time Period 07/20/24 to 10/11/24    Authorization - Visit Number 11    Authorization - Number of Visits 18    PT Start Time 1145    PT Stop Time 1230    PT Time Calculation (min) 45 min    Activity Tolerance Patient tolerated treatment well    Behavior During Therapy [facility] for tasks assessed/performed                Past Medical History:  Diagnosis Date   Abscess    right posterior neck   Anticoagulation adequate 06/07/2019   Overview:  SSS, paroxAFib/Flutter, CHADSvasc= 3 age, h/o cva; chronic OAC Xarelto    Atrial fibrillation and flutter (HCC) 01/01/2015   Noted 2016. Xarelto     BPH associated with nocturia 08/14/2014   Finasteride , flomax .  0-3x nocturia. PSA to 14-->eval urology [facility]. PSA trended back down to about 2.6 per records in 2015 then placed on finasteride - had significant voiding issues when spiked to 14- improved on meds    Bradycardia    CAD (coronary artery disease) 05/26/2016   Coronary CTA_ 50-75% D1 off CT report    Cardiac pacemaker in situ 12/28/2009   SA node dysfunction as cause    Chest pressure 12/01/2015   Overview:  atypical CP, [facility] nuclear perfusion imaging w/ no inarct, no ischemia, EF-40% NICM   Chronic systolic heart failure (HCC) 12/01/2015   Overview:  [facility] nuclear perfusion imaging study, no inarct, no ischemia, EF-40% NICM   CVA (cerebral vascular accident) (HCC)    Double vision 07/04/2018   Elevated LFTs    Erectile dysfunction 01/30/2015   cialis  prn    External hemorrhoids    Fitting or adjustment of cardiac pacemaker 06/07/2019   Overview:  B[place]ron[place] DR [id number], RA and RV leads [id number]:   [id number] and [id number]   Former smoker 01/30/2015   Former smoker. 20 pack years quit 92. Patient declined AAA screen.     GERD (gastroesophageal reflux disease)    Gout 11/11/2009   Qualifier: Diagnosis of  By: Gardenia Pao     History of nuclear stress test 06/07/2019   Overview:  [facility], no inarct, no ischemia, EF-40% NICM   HTN (hypertension)    Hyperglycemia 06/10/2017   110s CBG 2018   Hyperlipidemia 07/12/2007   Crestor  20mg  daily with some myalgias; fish oil. Trig 200-500 despite this. LDL ok.        Insomnia 01/30/2015   ambien  5mg  prn. May refill.     Ischemic optic neuropathy    Left chest pressure 03/19/2015   Other and unspecified hyperlipidemia    Sinoatrial node dysfunction (HCC)    Spinal stenosis    Trigger finger, right middle finger 03/01/2018   Past Surgical History:  Procedure Laterality Date   ABSCESS DRAINAGE     righ tposterior neck   CARDIAC CATHETERIZATION  1996   CHOLECYSTECTOMY  02/07/2003   COLONOSCOPY  06/23/2004   EYE SURGERY     left eye   left ingunial hernia  06/08/2011   PACEMAKER INSERTION     PPM GENERATOR CHANGEOUT N/A 07/12/2024   Procedure: PPM GENERATOR  CHANGEOUT;  Surgeon: Waddell Danelle ORN, MD;  [facility] INVASIVE CV LAB;  Service: Cardiovascular;  Laterality: N/A;   right hip replacement     2012 Dr. 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 06/07/2019   Palpitations 06/07/2019   History of nuclear stress test 06/07/2019   S/P cardiac cath 06/07/2019   Non-occlusive coronary artery disease 06/07/2019   Double vision 07/04/2018   Trigger finger, right middle finger 03/01/2018    Hyperglycemia 06/10/2017   Solitary pulmonary nodule 05/26/2016   CAD (coronary artery disease) 05/26/2016   Nonischemic cardiomyopathy (HCC) 04/23/2016   Chronic systolic heart failure (HCC) 12/01/2015   Chest pressure 12/01/2015   Left chest pressure 03/19/2015   Insomnia 01/30/2015   Erectile dysfunction 01/30/2015   Cerebral infarction (HCC) 01/30/2015   Former smoker 01/30/2015   Atrial fibrillation and flutter (HCC) 01/01/2015   Allergic rhinitis 10/11/2014   BPH associated with nocturia 08/14/2014   Ventricular tachycardia, non-sustained (HCC) 01/25/2013   Sinoatrial node dysfunction (HCC)    SPINAL STENOSIS, LUMBAR 08/22/2010   GERD 03/17/2010   Cardiac pacemaker in situ 12/28/2009   Gout 11/11/2009   Hyperlipidemia 07/12/2007   NEUROPATHY, ISCHEMIC OPTIC 06/02/2007   Essential hypertension 06/02/2007    PCP: [name]., MD  REFERRING PROVIDER: [name], DO  REFERRING DIAG: M54.42,M54.41,G89.29 (ICD-10-CM) - Chronic bilateral low back pain with bilateral sciatica M51.362 (ICD-10-CM) - Degeneration of intervertebral disc of lumbar region with discogenic back pain and lower extremity pain  Rationale for Evaluation and Treatment: Rehabilitation  THERAPY DIAG:  Other low back pain  Other abnormalities of gait and mobility  Difficulty in walking, not elsewhere classified  Muscle weakness (generalized)  Balance disorder  Cramp and spasm  Abnormal posture  ONSET DATE: February 2024 first visit to Dr Leonce  SUBJECTIVE:                                                                                                                                                                                           SUBJECTIVE STATEMENT: Patient reports he is doing good.  No significant soreness after last visit.     PERTINENT HISTORY:  [name] (next upcoming trip 7/14 for a [place] cruise) OA, trigger fingers, right hip arthroplasty,  pacemaker CVA 30 years ago affecting L side  PAIN:  07/27/24 Are you having pain? Yes: NPRS scale: 2/10 Pain location: low back Pain description: discomfort Aggravating factors: worst first thing in the morning or after lying on his couch, swimming Relieving factors: medication, movement  PRECAUTIONS: ICD/Pacemaker  RED FLAGS: None   WEIGHT BEARING RESTRICTIONS: No  FALLS:  Has patient fallen in last 6 months? Yes. Number of falls 1 fall.  Tripped over a chair while traveling at [place]  LIVING ENVIRONMENT: Lives with: lives with their spouse Lives in: House/apartment Stairs: one story Has following equipment at home: None  OCCUPATION: Retired  PLOF: Independent and Leisure: traveling (many upcoming trips planned), swimming  PATIENT GOALS: To feel better and not have to be reliant on pain medication as much and improve balance and flexibility.  NEXT MD VISIT: Follow up with Dr Leonce as needed  OBJECTIVE:  Note: Objective measures were completed at Evaluation unless otherwise noted.  DIAGNOSTIC FINDINGS:  Lumbar CT Scan on 02/04/2023: IMPRESSION: Multilevel degenerative changes which are most severe at the L3-4 level with significant spinal stenosis. No acute traumatic abnormalities identified.  PATIENT SURVEYS:  Modified Oswestry:  MODIFIED OSWESTRY DISABILITY SCALE   Date: 05/26/2024 Score  Pain intensity 2 =  Pain medication provides me with complete relief from pain.  2. Personal care (washing, dressing, etc.) 0 =  I can take care of myself normally without causing increased pain.  3. Lifting 2 = Pain prevents me from lifting heavy weights off the floor, activities (eg. sports, dancing). but I can manage if the weights are conveniently positioned (3) Pain prevents me from going out very often. (eg, on a table).  4. Walking 0 = Pain does not prevent me from walking any distance  5. Sitting 0 =  I can sit in any chair as long as I like.  6. Standing 1 =  I can stand  as long as I want but, it increases my pain.  7. Sleeping 1 = I can sleep well only by using pain medication.  8. Social Life 0 = My social life is normal and does not increase my pain.  9. Traveling 2 =  My pain restricts my travel over 2 hours.  10. Employment/ Homemaking 1 = My normal homemaking/job activities increase my pain, but I can still perform all that is required of me  Total 9 / 50 = 18.0 %   Interpretation of scores: Score Category Description  0-20% Minimal Disability The patient can cope with most living activities. Usually no treatment is indicated apart from advice on lifting, sitting and exercise  21-40% Moderate Disability The patient experiences more pain and difficulty with sitting, lifting and standing. Travel and social life are more difficult and they may be disabled from work. Personal care, sexual activity and sleeping are not grossly affected, and the patient can usually be managed by conservative means  41-60% Severe Disability Pain remains the main problem in this group, but activities of daily living are affected. These patients require a detailed investigation  61-80% Crippled Back pain impinges on all aspects of the patient's life. Positive intervention is required  81-100% Bed-bound  These patients are either bed-bound or exaggerating their symptoms  Bluford FORBES Zoe DELENA Karon DELENA, et al. Surgery versus conservative management of stable thoracolumbar fracture: the PRESTO feasibility RCT. [place]): [employer]; 2021 Nov. [facility] Technology Assessment, No. 25.62.) Appendix 3, Oswestry Disability Index category descriptors. Available from: FindJewel[employer].cz  Minimally Clinically Important Difference (MCID) = 12.8%   8/6/2 ODI 4/50 =  8%  COGNITION: Overall cognitive status: Within functional limits for tasks assessed     SENSATION: Patient states that he has a neuropathy problem in his feet, but it has improved  since starting Gabapentin   MUSCLE LENGTH: Hamstrings: increased tightness bilaterally Piriformis:  tightness bilat  POSTURE: rounded shoulders and forward head   LUMBAR ROM:   Eval:  Decreased by approx 50%  LOWER EXTREMITY ROM:     WFL  LOWER EXTREMITY MMT:    Eval:   LE grossly WNL, but grossly 4+ to 5-/5 in right hip Core strength grossly 4-/5 throughout  LUMBAR SPECIAL TESTS:  Slump test: Negative  FUNCTIONAL TESTS:  Eval: 5 times sit to stand: 11.87 sec Single Leg Stance:  right- 1.53 sec, left- 1.40 sec  GAIT: Distance walked: >500 ft Assistive device utilized: None Level of assistance: Complete Independence Comments: States that he has more difficulty with balance on unlevel surfaces (like grass) than firm surfaces.     MINI-BESTest: Balance Evaluation Systems Test ANTICIPATORY: SIT TO STAND: (2) normal without use of hands and stabilizes independently     (1) Moderate comes to stand WITH use of hands on 1st attempt    (0) Severe: unable to stand up from chair without assistance OR needs several attempts with use of   hands Score:  2  2. RISE TO TOES: feet shoulder width apart. Hands on hips.  Rise as high as you can onto your toes. Try to hold this pose for 3 sec                          (2) stable for 3s with maximum height    (1) heels up, but not full range (smaller than when holding hands) OR noticeable instability for 3 sec                           (0) < or equat to 3 s  Score:  1  3. STAND ON ONE LEG: look straight ahead. Hands on hips. Lift your leg off the ground.     Left:  trial 1 __3__ trial 2__6_                                    Right: Trial 1:___1   Trial 2:____3___   (2) 20 s       (2)  20 s                (1) < 20      (1) < 20 s   (0) Unable      (0) unable Score (use the worst side):  1  REACTIVE POSTURAL CONTROL 4. COMPENSATORY STEPPING CORRECTION FORWARD stand with feet shoulder width apart, arms at your sides. Lean forward  against my hands beyond your forward limits.  When I let go, do whatever is necessary, including taking a step, to avoid a fall   (2) recovers independently  with a single, large step, to avoid a fall   (1) more than one step used to recover equilibrium   (0) No step OR would fall if not caught Score:  0  5. COMPENSATORY STEPPING CORRECTION BACKWARD: Lean backward against my hands beyond your backward limits.  When I let go, do whatever is necessary, including a step to avoid a fall. (2) recovers independently  with  a single, large step, to avoid a fall   (1) more than one step used to recover equilibrium   (0) No step OR would fall if not caught Score:  0  6. COMPENSATORY STEPPING LATERAL: Lean into my hand beyond your sideways limit.  When I let go, do whatever is necessary, including taking a step, to avoid a fall. Left           Right   (2) recovers independently with 1 step (crossover or lateral OK)   (1) several steps to recover equilibrium   (0) falls or cannot step Score:     0  (use the side with the lowest score)  SENSORY ORIENTATION 7. STANCE FEET TOGETHER EYES OPEN  firm surface Be as stable and still as possible until I say stop   (2) 30s   (1) < 30 s   (0) unable Score:  2  8. STANCE ON FOAM EYES CLOSED feet together, eyes closed, hands on hips. Be as stable and still as you can until I stay stop.  I will start timing when you close your eyes. Time in seconds:      (2) 30s   (1) <30 s   (0) unable Score:  1  9. INCLINE EYES CLOSED: Stand on incline with feet shoulder width apart, arms down at your sides.  I will start timing when you close your eyes   (2) Stands 30 s and aligns with gravity   (1) stands < 30 s OR aligns with surface   (0) unable Score:  1  DYNAMIC GAIT 10. CHANGE IN GAIT SPEED: begin walking at your normal speed, when I tell you fast, walk as fast as you can, when I say slow walk very slowly   (2) significantly changes walking speed without  imbalance   (1) unable to change walking speed or signs of imbalance   (0) unable to achieve significant change in walking speed AND signes of imbalance Score: 2  11. WALK WITH HEAD TURNS HORIZONTAL: begin walking at normal speed, when I say right, turn your head to the right, when I say left turn your head to the left.  Try to keep yourself walking in a straight line   (2) performs head turns with no change in gait speed and good balance   (1) performs head turns with reduction in gait speed   (0)  performs head turns with imbalance Score:  2  12. WALK WITH PIVOT TURNS: begin walking at your normal speed.  When I tell you to turn and stop, turn as quickly as you can, face the opposite direction and stop.  After the turn, your feet should be close together   (2) turns with feet close FAST in 3 steps with good balance   (1) turns with feet close SLOW > 4 steps with good balance   (0) cannot turn with feet close at any speed without imbalance Score:2  13. STEP OVER OBSTACLES:  ( 9 inch height 10 feet away from subject) begin walking at your normal speed.  When you get to the box, step over it, not around it and keep walking   (2) Able to step over box with minimal change of gait speed and good balance   (1) Steps over box but touches box OR displays cautious behavior by slowing gait   (0) Unable to step over box OR steps around box Score:  2  14. TIMED UP AND GO NORMAL AND WITH  DUAL TASK: 3 METER WALK: When I say go walk at your your normal speed across the tape, turn around and come back to sit in the chair   Time: Count backwards by 3s starting at    20.  When I say go, stand up from the chair, walk at your normal speed across the tape on the floor, turn around, come back to sit in the chair.  Continue counting backwards the entire time.  Time:   (2) No noticeable change in sitting, standing or walking while backward counting when compared to TUG without dual task   (1) Dual task  affects either counting OR walking (>10%)    (0) stops counting while walking OR stops walking while counting If gait speed slows more than 10% between TUG without and with dual task, the score should be decreased by a point Score: 1  Total score:       17    /28      TREATMENT DATE:  07/27/24 Nustep x 5 min level 3 PT present to discuss status, proper adjustments and posture while on machine for optimum benefit Wall pull aways  2 x 10 Staggered stance at wall:  retro stepping with weight shift forward and back 2x10 each LE needed minor cga for lob Standing gastroc stretch 10 x 10 sec B using rocker board as slant board kneeling ant tib stretch 2x30 sec - sitting on heels on mat table (Used [place] bars for balance) Rocker board x 2 min with UE support Lunge to BOSU x 10 each LE fwd and lateral  Obstacle course with balance pad, cobblestone pads and hurdles: 5 laps with cga Marching with high knees, squats and heel to toe rocking on balance pad x 20 each Attempted cone touches 3 sets of 10 each LE with cga (cone on back counter)  07/25/24 Wall pull aways with back to the wall and using anterior tib muscles and toe extensors to pull body off the wall  Staggered stance at wall:  retro stepping with weight shift forward and back 2x10 needs intermittent UE support to recover balance Standing gastroc stretch 2x30 sec B kneeling ant tib stretch 2x30 sec - sitting on heels on mat table Seated ankle ROM all planes, circles and alphabet Second set of Staggered stance at wall:  retro stepping with weight shift forward and back 2x10 needs intermittent UE support to recover balance Agility ladder - multiple variations incorporating lateral, fwd, bwd and diagonal movements     07/19/24: Review of current status including new pacemaker put in last week Review of progress toward goals  Mini best test as above:   17  /28 Wall pull aways with back to the wall and using anterior tib muscles and toe  extensors to pull body off the wall 2x10 Staggered stance next to the barre:  retro stepping with weight shift forward and back 2x10 needs intermittent UE support to recover balance  (discussed for home to practice next to counter or in a corner for safety)  (will add to HEP next time)  PATIENT EDUCATION:  Education details: Issued HEP Person educated: Patient Education method: Explanation, Demonstration, and Handouts Education comprehension: verbalized understanding and returned demonstration  HOME EXERCISE PROGRAM: Access Code: [facility] URL: [email or web address] Date: 07/25/2024 Prepared by: Mliss  Exercises - Seated Transversus Abdominis Bracing  - 1 x daily - 7 x weekly - 2 sets - 10 reps - Seated Hamstring Stretch  - 1 x daily - 7 x weekly -  2 reps - 20 sec hold - Seated Piriformis Stretch  - 1 x daily - 7 x weekly - 2 reps - 20 sec hold - Single Leg Stance with Support  - 1 x daily - 7 x weekly - 2 reps - 20 sec hold - Shoulder External Rotation and Scapular Retraction with Resistance  - 1 x daily - 7 x weekly - 2 sets - 10 reps - Standing Shoulder Horizontal Abduction with Resistance  - 1 x daily - 7 x weekly - 2 sets - 10 reps - Standing Shoulder Row with Anchored Resistance  - 1 x daily - 7 x weekly - 2 sets - 10 reps - Shoulder extension with resistance - Neutral  - 1 x daily - 7 x weekly - 2 sets - 10 reps - Single Leg Balance with Clock Reach  - 1 x daily - 7 x weekly - 10 reps - Standing 'L' Stretch at Counter  - 1 x daily - 7 x weekly - 2 reps - 20 sec hold - Resisted Sit-to-Stand With Dumbbell at Chest  - 1 x daily - 3 x weekly - 2 sets - 10 reps - Side Step Overs with Cones and Unilateral Counter Support  - 1 x daily - 3 x weekly - 2 sets - 10 reps - Standing 3-Way Leg Reach with Resistance at Ankles and Counter Support  - 1 x daily - 3 x weekly - 2 sets - 10 reps - Side Stepping with Resistance at Ankles  - 1 x daily - 3 x weekly - 2 sets - 10 reps -  Forward Monster Walk with Resistance at Ankles and Counter Support  - 1 x daily - 3 x weekly - 2 sets - 10 reps - Squat with Chair Touch  - 1 x daily - 3 x weekly - 2 sets - 10 reps - Kneeling Plantarflexion Stretch  - 1 x daily - 3 x weekly - 1 sets - 3 reps - 30 sec hold - Supine Ankle Dorsiflexion and Plantarflexion AROM  - 1 x daily - 7 x weekly - 2 sets - 10 reps - Supine Ankle Inversion and Eversion AROM  - 1 x daily - 7 x weekly - 2 sets - 10 reps - Supine Ankle Circles  - 1 x daily - 7 x weekly - 2 sets - 10 reps - Seated Ankle Alphabet  - 1 x daily - 7 x weekly - 1 sets - 1 reps  ASSESSMENT:  CLINICAL IMPRESSION: Patient is progressing appropriately.  He continues to experience lob posteriorly at times.  He did well on the obstacle course and only had one LOB on this tasks.  He fatigues but less rest breaks needed today.  He would benefit from continuing skilled PT to meet goals stated below.     OBJECTIVE IMPAIRMENTS: decreased balance, decreased strength, increased muscle spasms, impaired flexibility, postural dysfunction, and pain.   ACTIVITY LIMITATIONS: lifting, standing, and stairs  PARTICIPATION LIMITATIONS: driving, community activity, and yard work  PERSONAL FACTORS: Time since onset of injury/illness/exacerbation and 1-2 comorbidities: OA, right hip arthroplasty are also affecting patient's functional outcome.   REHAB POTENTIAL: Good  CLINICAL DECISION MAKING: Stable/uncomplicated  EVALUATION COMPLEXITY: Low   GOALS: Goals reviewed with patient? Yes  SHORT TERM GOALS: Target date: 06/16/2024  Patient will be independent with initial HEP. Baseline: Goal status: Met on 05/31/24  2.  Patient will demonstrate and verbalize knowledge of proper lifting techniques to allow him to  lift suitcases for travel. Baseline:  Goal status: Ongoing   LONG TERM GOALS: Target date: 10/11/2024    Patient will be independent with advanced HEP to allow for self progression  after discharge. Baseline:  Goal status: INITIAL  2.  Patient will improve modified Oswestry to not greater than 5% to demonstrate improved functional mobility. Baseline: 18% Goal status: IN PROGRESS 07/05/24  8%   3.  Patient will increase right hip strength to [facility] to allow him to navigate stairs with reciprocal pattern without difficulty/discomfort. Baseline:  Goal status: MET 07/05/24  4.  Patient will increase bilateral single leg stance to greater than 10 seconds to decrease patients fall risk. Baseline: right- 1.53 sec, left- 1.40 sec Goal status: IN PROGRESS 07/03/24  5.  Patient will report ability to ambulate over various unlevel surfaces for desired length (at least greater than 30 minutes) without a loss of balance to allow patient to travel and sight-see. Baseline:  Goal status: ongoing  6 Patient will improve dynamic balance by using an ankle strategy in order to reduce loss of balance in the posterior direction New  7. Improved mini- best score to 23/28 indicating improved dynamic balance and decreased risk of falls  PLAN:  PT FREQUENCY: 2x/week  PT DURATION: 12 weeks  (pt will be traveling for a few weeks in September)  PLANNED INTERVENTIONS: 02835- PT Re-evaluation, 97750- Physical Performance Testing, 97110-Therapeutic exercises, 97530- Therapeutic activity, [id number]- Neuromuscular re-education, 97535- Self Care, 02859- Manual therapy, [id number]- Gait training, [phone]- Canalith repositioning, [id number]- Aquatic Therapy, [phone]- Electrical stimulation (unattended), [phone]- Electrical stimulation (manual), [id number]- Ultrasound, [id number]- Traction (mechanical), [id number]- Ionotophoresis 4mg /ml Dexamethasone , [id number] (1-2 muscles), [id number] (3+ muscles)- Dry Needling, Patient/Family education, Balance training, Stair training, Taping, Joint mobilization, Joint manipulation, Spinal manipulation, Spinal mobilization, Cryotherapy, and Moist heat.  PLAN FOR NEXT SESSION: continue with agility ladder with bwd  activities, side step, monster walk; dynamic balance (uneven surface, reactive balance);  add anterior tibialis activation ex's to HEP (back to the wall and pull off the wall using ankle strategy, staggered stance rocking for ankle strategy    Quandre Polinski B. Irena Gaydos, PT 07/27/24 1:27 PM [facility] [address], [address] [place], [place] [address] Phone # [phone] Fax [phone]

## 2024-08-01 ENCOUNTER — Other Ambulatory Visit (HOSPITAL_BASED_OUTPATIENT_CLINIC_OR_DEPARTMENT_OTHER): Payer: Self-pay

## 2024-08-01 MED ORDER — FLUZONE HIGH-DOSE 0.5 ML IM SUSY
0.5000 mL | PREFILLED_SYRINGE | Freq: Once | INTRAMUSCULAR | 0 refills | Status: AC
Start: 2024-08-01 — End: 2024-08-02
  Filled 2024-08-01: qty 0.5, 1d supply, fill #0

## 2024-08-20 NOTE — Therapy (Signed)
 OUTPATIENT PHYSICAL THERAPY TREATMENT NOTE/RECERTIFICATION   Patient Name: [name] MRN: [id number] DOB:Feb 10, 1946, 78 y.o., male Today's Date: 08/21/2024  END OF SESSION:  PT End of Session - 08/21/24 1450     Visit Number 12    Date for Recertification  10/11/24    Authorization Type [employer] MCR - 18 visits approved    Authorization Time Period 07/20/24 to 10/11/24    Authorization - Visit Number 12    Authorization - Number of Visits 18    Progress Note Due on Visit 19    PT Start Time 1400    PT Stop Time 1449    PT Time Calculation (min) 49 min    Activity Tolerance Patient tolerated treatment well    Behavior During Therapy [name] for tasks assessed/performed                 Past Medical History:  Diagnosis Date   Abscess    right posterior neck   Anticoagulation adequate 06/07/2019   Overview:  SSS, paroxAFib/Flutter, CHADSvasc= 3 age, h/o cva; chronic OAC Xarelto    Atrial fibrillation and flutter (HCC) 01/01/2015   Noted 2016. Xarelto     BPH associated with nocturia 08/14/2014   Finasteride , flomax .  0-3x nocturia. PSA to 14-->eval urology [facility]. PSA trended back down to about 2.6 per records in 2015 then placed on finasteride - had significant voiding issues when spiked to 14- improved on meds    Bradycardia    CAD (coronary artery disease) 05/26/2016   Coronary CTA_ 50-75% D1 off CT report    Cardiac pacemaker in situ 12/28/2009   SA node dysfunction as cause    Chest pressure 12/01/2015   Overview:  atypical CP, [facility] nuclear perfusion imaging w/ no inarct, no ischemia, EF-40% NICM   Chronic systolic heart failure (HCC) 12/01/2015   Overview:  [facility] nuclear perfusion imaging study, no inarct, no ischemia, EF-40% NICM   CVA (cerebral vascular accident) (HCC)    Double vision 07/04/2018   Elevated LFTs    Erectile dysfunction 01/30/2015   cialis  prn    External hemorrhoids    Fitting or adjustment of cardiac pacemaker 06/07/2019   Overview:  B[address] [place] DR  [id number], RA and RV leads [id number]:  [id number] and [id number]   Former smoker 01/30/2015   Former smoker. 20 pack years quit 92. Patient declined AAA screen.     GERD (gastroesophageal reflux disease)    Gout 11/11/2009   Qualifier: Diagnosis of  By: Gardenia Pao     History of nuclear stress test 06/07/2019   Overview:  [facility], no inarct, no ischemia, EF-40% NICM   HTN (hypertension)    Hyperglycemia 06/10/2017   110s CBG 2018   Hyperlipidemia 07/12/2007   Crestor  20mg  daily with some myalgias; fish oil. Trig 200-500 despite this. LDL ok.        Insomnia 01/30/2015   ambien  5mg  prn. May refill.     Ischemic optic neuropathy    Left chest pressure 03/19/2015   Other and unspecified hyperlipidemia    Sinoatrial node dysfunction (HCC)    Spinal stenosis    Trigger finger, right middle finger 03/01/2018   Past Surgical History:  Procedure Laterality Date   ABSCESS DRAINAGE     righ tposterior neck   CARDIAC CATHETERIZATION  1996   CHOLECYSTECTOMY  02/07/2003   COLONOSCOPY  06/23/2004   EYE SURGERY     left eye   left ingunial hernia  06/08/2011   PACEMAKER INSERTION  PPM GENERATOR CHANGEOUT N/A 07/12/2024   Procedure: PPM GENERATOR CHANGEOUT;  Surgeon: Nathan Danelle ORN, MD;  Location: [facility] INVASIVE CV LAB;  Service: Cardiovascular;  Laterality: N/A;   right hip replacement     2012 Dr. Hiram   Patient Active Problem List   Diagnosis Date Noted   Aortic atherosclerosis 08/01/2020   Fatty liver 08/01/2020   Paroxysmal atrial fibrillation (HCC) 07/27/2019   Anticoagulation adequate 06/07/2019   Convergence insufficiency 06/07/2019   Fitting or adjustment of cardiac pacemaker 06/07/2019   Pacemaker-dependent due to native cardiac rhythm insufficient to support life 06/07/2019   Palpitations 06/07/2019   History of nuclear stress test 06/07/2019   S/P cardiac cath 06/07/2019   Non-occlusive coronary artery disease 06/07/2019   Double vision 07/04/2018   Trigger finger, right  middle finger 03/01/2018   Hyperglycemia 06/10/2017   Solitary pulmonary nodule 05/26/2016   CAD (coronary artery disease) 05/26/2016   Nonischemic cardiomyopathy (HCC) 04/23/2016   Chronic systolic heart failure (HCC) 12/01/2015   Chest pressure 12/01/2015   Left chest pressure 03/19/2015   Insomnia 01/30/2015   Erectile dysfunction 01/30/2015   Cerebral infarction (HCC) 01/30/2015   Former smoker 01/30/2015   Atrial fibrillation and flutter (HCC) 01/01/2015   Allergic rhinitis 10/11/2014   BPH associated with nocturia 08/14/2014   Ventricular tachycardia, non-sustained (HCC) 01/25/2013   Sinoatrial node dysfunction (HCC)    SPINAL STENOSIS, LUMBAR 08/22/2010   GERD 03/17/2010   Cardiac pacemaker in situ 12/28/2009   Gout 11/11/2009   Hyperlipidemia 07/12/2007   NEUROPATHY, ISCHEMIC OPTIC 06/02/2007   Essential hypertension 06/02/2007    PCP: Nathan Garnette KIDD., MD  REFERRING PROVIDER: [name], DO  REFERRING DIAG: M54.42,M54.41,G89.29 (ICD-10-CM) - Chronic bilateral low back pain with bilateral sciatica M51.362 (ICD-10-CM) - Degeneration of intervertebral disc of lumbar region with discogenic back pain and lower extremity pain  Rationale for Evaluation and Treatment: Rehabilitation  THERAPY DIAG:  Other low back pain  Other abnormalities of gait and mobility  Difficulty in walking, not elsewhere classified  Muscle weakness (generalized)  Balance disorder  Cramp and spasm  Abnormal posture  ONSET DATE: February 2024 first visit to Dr Nathan  SUBJECTIVE:                                                                                                                                                                                           SUBJECTIVE STATEMENT: My thighs hurt. I don't know why. We have 8 weeks to get me in shape before I travel to [place].   PERTINENT HISTORY:  [name] frequently (next upcoming trip 7/14 for a [name]) OA,  trigger  fingers, right hip arthroplasty, pacemaker CVA 30 years ago affecting L side  PAIN:  07/27/24 Are you having pain? Yes: NPRS scale: 2/10 Pain location: low back Pain description: discomfort Aggravating factors: worst first thing in the morning or after lying on his couch, swimming Relieving factors: medication, movement  PRECAUTIONS: ICD/Pacemaker  RED FLAGS: None   WEIGHT BEARING RESTRICTIONS: No  FALLS:  Has patient fallen in last 6 months? Yes. Number of falls 1 fall.  Tripped over a chair while traveling at [place]  LIVING ENVIRONMENT: Lives with: lives with their spouse Lives in: House/apartment Stairs: one story Has following equipment at home: None  OCCUPATION: Retired  PLOF: Independent and Leisure: traveling (many upcoming trips planned), swimming  PATIENT GOALS: To feel better and not have to be reliant on pain medication as much and improve balance and flexibility.  NEXT MD VISIT: Follow up with Dr Nathan as needed  OBJECTIVE:  Note: Objective measures were completed at Evaluation unless otherwise noted.  DIAGNOSTIC FINDINGS:  Lumbar CT Scan on 02/04/2023: IMPRESSION: Multilevel degenerative changes which are most severe at the L3-4 level with significant spinal stenosis. No acute traumatic abnormalities identified.  PATIENT SURVEYS:  Modified Oswestry:  MODIFIED OSWESTRY DISABILITY SCALE   Date: 05/26/2024 Score  Pain intensity 2 =  Pain medication provides me with complete relief from pain.  2. Personal care (washing, dressing, etc.) 0 =  I can take care of myself normally without causing increased pain.  3. Lifting 2 = Pain prevents me from lifting heavy weights off the floor, activities (eg. sports, dancing). but I can manage if the weights are conveniently positioned (3) Pain prevents me from going out very often. (eg, on a table).  4. Walking 0 = Pain does not prevent me from walking any distance  5. Sitting 0 =  I can sit in any chair as long  as I like.  6. Standing 1 =  I can stand as long as I want but, it increases my pain.  7. Sleeping 1 = I can sleep well only by using pain medication.  8. Social Life 0 = My social life is normal and does not increase my pain.  9. Traveling 2 =  My pain restricts my travel over 2 hours.  10. Employment/ Homemaking 1 = My normal homemaking/job activities increase my pain, but I can still perform all that is required of me  Total 9 / 50 = 18.0 %   Interpretation of scores: Score Category Description  0-20% Minimal Disability The patient can cope with most living activities. Usually no treatment is indicated apart from advice on lifting, sitting and exercise  21-40% Moderate Disability The patient experiences more pain and difficulty with sitting, lifting and standing. Travel and social life are more difficult and they may be disabled from work. Personal care, sexual activity and sleeping are not grossly affected, and the patient can usually be managed by conservative means  41-60% Severe Disability Pain remains the main problem in this group, but activities of daily living are affected. These patients require a detailed investigation  61-80% Crippled Back pain impinges on all aspects of the patient's life. Positive intervention is required  81-100% Bed-bound  These patients are either bed-bound or exaggerating their symptoms  Bluford FORBES Zoe DELENA Karon DELENA, et al. Surgery versus conservative management of stable thoracolumbar fracture: the PRESTO feasibility RCT. [place]): [employer]; 2021 Nov. [facility] Technology Assessment, No. 25.62.) Appendix 3, Oswestry Disability Index category descriptors. Available  from: FindJewelers.cz  Minimally Clinically Important Difference (MCID) = 12.8%   8/6/2 ODI 4/50 = 8%  COGNITION: Overall cognitive status: Within functional limits for tasks assessed     SENSATION: Patient states that he has a neuropathy  problem in his feet, but it has improved since starting Gabapentin   MUSCLE LENGTH: Hamstrings: increased tightness bilaterally Piriformis:  tightness bilat  POSTURE: rounded shoulders and forward head   LUMBAR ROM:   Eval:  Decreased by approx 50%  LOWER EXTREMITY ROM:     WFL  LOWER EXTREMITY MMT:    Eval:   LE grossly WNL, but grossly 4+ to 5-/5 in right hip Core strength grossly 4-/5 throughout  LUMBAR SPECIAL TESTS:  Slump test: Negative  FUNCTIONAL TESTS:  Eval: 5 times sit to stand: 11.87 sec Single Leg Stance:  right- 1.53 sec, left- 1.40 sec  GAIT: Distance walked: >500 ft Assistive device utilized: None Level of assistance: Complete Independence Comments: States that he has more difficulty with balance on unlevel surfaces (like grass) than firm surfaces.     MINI-BESTest: Balance Evaluation Systems Test ANTICIPATORY: SIT TO STAND: (2) normal without use of hands and stabilizes independently     (1) Moderate comes to stand WITH use of hands on 1st attempt    (0) Severe: unable to stand up from chair without assistance OR needs several attempts with use of   hands Score:  2  2. RISE TO TOES: feet shoulder width apart. Hands on hips.  Rise as high as you can onto your toes. Try to hold this pose for 3 sec                          (2) stable for 3s with maximum height    (1) heels up, but not full range (smaller than when holding hands) OR noticeable instability for 3 sec                           (0) < or equat to 3 s  Score:  1  3. STAND ON ONE LEG: look straight ahead. Hands on hips. Lift your leg off the ground.     Left:  trial 1 __3__ trial 2__6_                                    Right: Trial 1:___1   Trial 2:____3___   (2) 20 s       (2)  20 s                (1) < 20      (1) < 20 s   (0) Unable      (0) unable Score (use the worst side):  1  REACTIVE POSTURAL CONTROL 4. COMPENSATORY STEPPING CORRECTION FORWARD stand with feet shoulder width  apart, arms at your sides. Lean forward against my hands beyond your forward limits.  When I let go, do whatever is necessary, including taking a step, to avoid a fall   (2) recovers independently  with a single, large step, to avoid a fall   (1) more than one step used to recover equilibrium   (0) No step OR would fall if not caught Score:  0  5. COMPENSATORY STEPPING CORRECTION BACKWARD: Lean backward against my hands beyond your backward limits.  When I let go,  do whatever is necessary, including a step to avoid a fall. (2) recovers independently  with a single, large step, to avoid a fall   (1) more than one step used to recover equilibrium   (0) No step OR would fall if not caught Score:  0  6. COMPENSATORY STEPPING LATERAL: Lean into my hand beyond your sideways limit.  When I let go, do whatever is necessary, including taking a step, to avoid a fall. Left           Right   (2) recovers independently with 1 step (crossover or lateral OK)   (1) several steps to recover equilibrium   (0) falls or cannot step Score:     0  (use the side with the lowest score)  SENSORY ORIENTATION 7. STANCE FEET TOGETHER EYES OPEN  firm surface Be as stable and still as possible until I say stop   (2) 30s   (1) < 30 s   (0) unable Score:  2  8. STANCE ON FOAM EYES CLOSED feet together, eyes closed, hands on hips. Be as stable and still as you can until I stay stop.  I will start timing when you close your eyes. Time in seconds:      (2) 30s   (1) <30 s   (0) unable Score:  1  9. INCLINE EYES CLOSED: Stand on incline with feet shoulder width apart, arms down at your sides.  I will start timing when you close your eyes   (2) Stands 30 s and aligns with gravity   (1) stands < 30 s OR aligns with surface   (0) unable Score:  1  DYNAMIC GAIT 10. CHANGE IN GAIT SPEED: begin walking at your normal speed, when I tell you fast, walk as fast as you can, when I say slow walk very slowly   (2)  significantly changes walking speed without imbalance   (1) unable to change walking speed or signs of imbalance   (0) unable to achieve significant change in walking speed AND signes of imbalance Score: 2  11. WALK WITH HEAD TURNS HORIZONTAL: begin walking at normal speed, when I say right, turn your head to the right, when I say left turn your head to the left.  Try to keep yourself walking in a straight line   (2) performs head turns with no change in gait speed and good balance   (1) performs head turns with reduction in gait speed   (0)  performs head turns with imbalance Score:  2  12. WALK WITH PIVOT TURNS: begin walking at your normal speed.  When I tell you to turn and stop, turn as quickly as you can, face the opposite direction and stop.  After the turn, your feet should be close together   (2) turns with feet close FAST in 3 steps with good balance   (1) turns with feet close SLOW > 4 steps with good balance   (0) cannot turn with feet close at any speed without imbalance Score:2  13. STEP OVER OBSTACLES:  ( 9 inch height 10 feet away from subject) begin walking at your normal speed.  When you get to the box, step over it, not around it and keep walking   (2) Able to step over box with minimal change of gait speed and good balance   (1) Steps over box but touches box OR displays cautious behavior by slowing gait   (0) Unable to step over box  OR steps around box Score:  2  14. TIMED UP AND GO NORMAL AND WITH DUAL TASK: 3 METER WALK: When I say go walk at your your normal speed across the tape, turn around and come back to sit in the chair   Time: Count backwards by 3s starting at    20.  When I say go, stand up from the chair, walk at your normal speed across the tape on the floor, turn around, come back to sit in the chair.  Continue counting backwards the entire time.  Time:   (2) No noticeable change in sitting, standing or walking while backward counting when compared to  TUG without dual task   (1) Dual task affects either counting OR walking (>10%)    (0) stops counting while walking OR stops walking while counting If gait speed slows more than 10% between TUG without and with dual task, the score should be decreased by a point Score: 1  Total score:       17    /28      TREATMENT DATE:   08/21/24 Bike L 3 x 5 min PT present to discuss status, proper adjustments and posture while on machine for optimum benefit Standing quad stretch x 30 sec B Prone quad stretch 2x30 sec B Seated IASTM with roller to B thighs Retro walking x 6 laps - better with bigger step length SBA Braiding x 6 laps at [facility] Single leg cross taps (like braiding) 2x10 B - challenging SBA to CGA Walking, lateral steps, weight shifts on cobblestone pads with SBA Attempted cone touches 3 sets of 5 each LE SBA better with shoes off (cone on back counter) Agility ladder: various patterns, forward, backward and lateral - backward most challenging   07/27/24 Nustep x 5 min level 3 PT present to discuss status, proper adjustments and posture while on machine for optimum benefit Wall pull aways  2 x 10 Staggered stance at wall:  retro stepping with weight shift forward and back 2x10 each LE needed minor cga for lob Standing gastroc stretch 10 x 10 sec B using rocker board as slant board kneeling ant tib stretch 2x30 sec - sitting on heels on mat table (Used [place] bars for balance) Rocker board x 2 min with UE support Lunge to BOSU x 10 each LE fwd and lateral  Obstacle course with balance pad, cobblestone pads and hurdles: 5 laps with cga Marching with high knees, squats and heel to toe rocking on balance pad x 20 each Attempted cone touches 3 sets of 10 each LE with cga (cone on back counter)  07/25/24 Wall pull aways with back to the wall and using anterior tib muscles and toe extensors to pull body off the wall  Staggered stance at wall:  retro stepping with weight shift forward and  back 2x10 needs intermittent UE support to recover balance Standing gastroc stretch 2x30 sec B kneeling ant tib stretch 2x30 sec - sitting on heels on mat table Seated ankle ROM all planes, circles and alphabet Second set of Staggered stance at wall:  retro stepping with weight shift forward and back 2x10 needs intermittent UE support to recover balance Agility ladder - multiple variations incorporating lateral, fwd, bwd and diagonal movements     07/19/24: Review of current status including new pacemaker put in last week Review of progress toward goals  Mini best test as above:   17  /28 Wall pull aways with back to the wall and  using anterior tib muscles and toe extensors to pull body off the wall 2x10 Staggered stance next to the barre:  retro stepping with weight shift forward and back 2x10 needs intermittent UE support to recover balance  (discussed for home to practice next to counter or in a corner for safety)  (will add to HEP next time)  PATIENT EDUCATION:  Education details: Issued HEP Person educated: Patient Education method: Explanation, Demonstration, and Handouts Education comprehension: verbalized understanding and returned demonstration  HOME EXERCISE PROGRAM: Access Code: [facility] URL: [email or web address] Date: 07/25/2024 Prepared by: Mliss  Exercises - Seated Transversus Abdominis Bracing  - 1 x daily - 7 x weekly - 2 sets - 10 reps - Seated Hamstring Stretch  - 1 x daily - 7 x weekly - 2 reps - 20 sec hold - Seated Piriformis Stretch  - 1 x daily - 7 x weekly - 2 reps - 20 sec hold - Single Leg Stance with Support  - 1 x daily - 7 x weekly - 2 reps - 20 sec hold - Shoulder External Rotation and Scapular Retraction with Resistance  - 1 x daily - 7 x weekly - 2 sets - 10 reps - Standing Shoulder Horizontal Abduction with Resistance  - 1 x daily - 7 x weekly - 2 sets - 10 reps - Standing Shoulder Row with Anchored Resistance  - 1 x daily - 7 x weekly  - 2 sets - 10 reps - Shoulder extension with resistance - Neutral  - 1 x daily - 7 x weekly - 2 sets - 10 reps - Single Leg Balance with Clock Reach  - 1 x daily - 7 x weekly - 10 reps - Standing 'L' Stretch at Counter  - 1 x daily - 7 x weekly - 2 reps - 20 sec hold - Resisted Sit-to-Stand With Dumbbell at Chest  - 1 x daily - 3 x weekly - 2 sets - 10 reps - Side Step Overs with Cones and Unilateral Counter Support  - 1 x daily - 3 x weekly - 2 sets - 10 reps - Standing 3-Way Leg Reach with Resistance at Ankles and Counter Support  - 1 x daily - 3 x weekly - 2 sets - 10 reps - Side Stepping with Resistance at Ankles  - 1 x daily - 3 x weekly - 2 sets - 10 reps - Forward Monster Walk with Resistance at Ankles and Counter Support  - 1 x daily - 3 x weekly - 2 sets - 10 reps - Squat with Chair Touch  - 1 x daily - 3 x weekly - 2 sets - 10 reps - Kneeling Plantarflexion Stretch  - 1 x daily - 3 x weekly - 1 sets - 3 reps - 30 sec hold - Supine Ankle Dorsiflexion and Plantarflexion AROM  - 1 x daily - 7 x weekly - 2 sets - 10 reps - Supine Ankle Inversion and Eversion AROM  - 1 x daily - 7 x weekly - 2 sets - 10 reps - Supine Ankle Circles  - 1 x daily - 7 x weekly - 2 sets - 10 reps - Seated Ankle Alphabet  - 1 x daily - 7 x weekly - 1 sets - 1 reps  ASSESSMENT:  CLINICAL IMPRESSION: Patient returns after long vacation. He reports increased thigh pain of unknown origin. This was addressed with the bike, stretching and self MFR. We then returned to focusing on balance activities. Patient struggles primarily with  SLS,  backward movements and activities crossing midline. He did demonstrate slightly better SLS barefooted. Patient demonstrates good stepping strategies today requiring only a few incidences of CGA.  He will benefit from skilled PT to address these deficits and those listed below.    OBJECTIVE IMPAIRMENTS: decreased balance, decreased strength, increased muscle spasms, impaired  flexibility, postural dysfunction, and pain.   ACTIVITY LIMITATIONS: lifting, standing, and stairs  PARTICIPATION LIMITATIONS: driving, community activity, and yard work  PERSONAL FACTORS: Time since onset of injury/illness/exacerbation and 1-2 comorbidities: OA, right hip arthroplasty are also affecting patient's functional outcome.   REHAB POTENTIAL: Good  CLINICAL DECISION MAKING: Stable/uncomplicated  EVALUATION COMPLEXITY: Low   GOALS: Goals reviewed with patient? Yes  SHORT TERM GOALS: Target date: 06/16/2024  Patient will be independent with initial HEP. Baseline: Goal status: Met on 05/31/24  2.  Patient will demonstrate and verbalize knowledge of proper lifting techniques to allow him to lift suitcases for travel. Baseline:  Goal status: Ongoing   LONG TERM GOALS: Target date: 10/11/2024    Patient will be independent with advanced HEP to allow for self progression after discharge. Baseline:  Goal status: INITIAL  2.  Patient will improve modified Oswestry to not greater than 5% to demonstrate improved functional mobility. Baseline: 18% Goal status: IN PROGRESS 07/05/24  8%   3.  Patient will increase right hip strength to [facility] to allow him to navigate stairs with reciprocal pattern without difficulty/discomfort. Baseline:  Goal status: MET 07/05/24  4.  Patient will increase bilateral single leg stance to greater than 10 seconds to decrease patients fall risk. Baseline: right- 1.53 sec, left- 1.40 sec Goal status: IN PROGRESS 07/03/24  5.  Patient will report ability to ambulate over various unlevel surfaces for desired length (at least greater than 30 minutes) without a loss of balance to allow patient to travel and sight-see. Baseline:  Goal status: ongoing  6 Patient will improve dynamic balance by using an ankle strategy in order to reduce loss of balance in the posterior direction New  7. Improved mini- best score to 23/28 indicating improved dynamic  balance and decreased risk of falls  PLAN:  PT FREQUENCY: 2x/week  PT DURATION: 12 weeks  (pt will be traveling for a few weeks in September)  PLANNED INTERVENTIONS: 02835- PT Re-evaluation, 97750- Physical Performance Testing, 97110-Therapeutic exercises, 97530- Therapeutic activity, [id number]- Neuromuscular re-education, 97535- Self Care, 02859- Manual therapy, [phone]- Gait training, [phone]- Canalith repositioning, [id number]- Aquatic Therapy, [phone]- Electrical stimulation (unattended), [phone]- Electrical stimulation (manual), [id number]- Ultrasound, [id number]- Traction (mechanical), [id number]- Ionotophoresis 4mg /ml Dexamethasone , 79439 (1-2 muscles), [id number] (3+ muscles)- Dry Needling, Patient/Family education, Balance training, Stair training, Taping, Joint mobilization, Joint manipulation, Spinal manipulation, Spinal mobilization, Cryotherapy, and Moist heat.  PLAN FOR NEXT SESSION: continue with agility ladder with bwd activities, SLS and crossing midline activities, side step, monster walk; dynamic balance (uneven surface, reactive balance);  add anterior tibialis activation ex's to HEP (back to the wall and pull off the wall using ankle strategy, staggered stance rocking for ankle strategy    Mliss Cummins, PT 08/21/24 3:06 PM [facility] [address] [place], [place] [address] Phone # [phone] Fax [phone]

## 2024-08-21 ENCOUNTER — Encounter: Payer: Self-pay | Admitting: Physical Therapy

## 2024-08-21 ENCOUNTER — Ambulatory Visit: Attending: Sports Medicine | Admitting: Physical Therapy

## 2024-08-21 DIAGNOSIS — R293 Abnormal posture: Secondary | ICD-10-CM | POA: Diagnosis not present

## 2024-08-21 DIAGNOSIS — R262 Difficulty in walking, not elsewhere classified: Secondary | ICD-10-CM | POA: Diagnosis not present

## 2024-08-21 DIAGNOSIS — R2689 Other abnormalities of gait and mobility: Secondary | ICD-10-CM | POA: Diagnosis not present

## 2024-08-21 DIAGNOSIS — R252 Cramp and spasm: Secondary | ICD-10-CM | POA: Diagnosis not present

## 2024-08-21 DIAGNOSIS — M5459 Other low back pain: Secondary | ICD-10-CM | POA: Diagnosis not present

## 2024-08-21 DIAGNOSIS — M6281 Muscle weakness (generalized): Secondary | ICD-10-CM | POA: Insufficient documentation

## 2024-08-22 DIAGNOSIS — Z5181 Encounter for therapeutic drug level monitoring: Secondary | ICD-10-CM | POA: Diagnosis not present

## 2024-08-22 DIAGNOSIS — Z85828 Personal history of other malignant neoplasm of skin: Secondary | ICD-10-CM | POA: Diagnosis not present

## 2024-08-22 DIAGNOSIS — C44619 Basal cell carcinoma of skin of left upper limb, including shoulder: Secondary | ICD-10-CM | POA: Diagnosis not present

## 2024-08-22 DIAGNOSIS — C44519 Basal cell carcinoma of skin of other part of trunk: Secondary | ICD-10-CM | POA: Diagnosis not present

## 2024-08-22 DIAGNOSIS — D485 Neoplasm of uncertain behavior of skin: Secondary | ICD-10-CM | POA: Diagnosis not present

## 2024-08-22 DIAGNOSIS — L578 Other skin changes due to chronic exposure to nonionizing radiation: Secondary | ICD-10-CM | POA: Diagnosis not present

## 2024-08-22 DIAGNOSIS — L219 Seborrheic dermatitis, unspecified: Secondary | ICD-10-CM | POA: Diagnosis not present

## 2024-08-22 DIAGNOSIS — L821 Other seborrheic keratosis: Secondary | ICD-10-CM | POA: Diagnosis not present

## 2024-08-22 DIAGNOSIS — L111 Transient acantholytic dermatosis [Grover]: Secondary | ICD-10-CM | POA: Diagnosis not present

## 2024-08-22 DIAGNOSIS — L570 Actinic keratosis: Secondary | ICD-10-CM | POA: Diagnosis not present

## 2024-08-22 DIAGNOSIS — D225 Melanocytic nevi of trunk: Secondary | ICD-10-CM | POA: Diagnosis not present

## 2024-08-23 ENCOUNTER — Ambulatory Visit: Admitting: Physical Therapy

## 2024-08-23 ENCOUNTER — Encounter: Payer: Self-pay | Admitting: Physical Therapy

## 2024-08-23 DIAGNOSIS — R2689 Other abnormalities of gait and mobility: Secondary | ICD-10-CM | POA: Diagnosis not present

## 2024-08-23 DIAGNOSIS — R252 Cramp and spasm: Secondary | ICD-10-CM | POA: Diagnosis not present

## 2024-08-23 DIAGNOSIS — R262 Difficulty in walking, not elsewhere classified: Secondary | ICD-10-CM | POA: Diagnosis not present

## 2024-08-23 DIAGNOSIS — M5459 Other low back pain: Secondary | ICD-10-CM

## 2024-08-23 DIAGNOSIS — M6281 Muscle weakness (generalized): Secondary | ICD-10-CM

## 2024-08-23 DIAGNOSIS — R293 Abnormal posture: Secondary | ICD-10-CM | POA: Diagnosis not present

## 2024-08-23 NOTE — Therapy (Signed)
 OUTPATIENT PHYSICAL THERAPY TREATMENT NOTE   Patient Name: [name] MRN: [id number] DOB:22-Sep-1946, 78 y.o., male Today's Date: 08/23/2024  END OF SESSION:  PT End of Session - 08/23/24 1715     Visit Number 13    Date for Recertification  10/11/24    Authorization Type [employer] MCR - 18 visits approved    Authorization Time Period 07/20/24 to 10/11/24    Authorization - Visit Number 13    Authorization - Number of Visits 18    Progress Note Due on Visit 19    PT Start Time 1534    PT Stop Time 1615    PT Time Calculation (min) 41 min    Activity Tolerance Patient tolerated treatment well    Behavior During Therapy [facility] for tasks assessed/performed                  Past Medical History:  Diagnosis Date   Abscess    right posterior neck   Anticoagulation adequate 06/07/2019   Overview:  SSS, paroxAFib/Flutter, CHADSvasc= 3 age, h/o cva; chronic OAC Xarelto    Atrial fibrillation and flutter (HCC) 01/01/2015   Noted 2016. Xarelto     BPH associated with nocturia 08/14/2014   Finasteride , flomax .  0-3x nocturia. PSA to 14-->eval urology [facility]. PSA trended back down to about 2.6 per records in 2015 then placed on finasteride - had significant voiding issues when spiked to 14- improved on meds    Bradycardia    CAD (coronary artery disease) 05/26/2016   Coronary CTA_ 50-75% D1 off CT report    Cardiac pacemaker in situ 12/28/2009   SA node dysfunction as cause    Chest pressure 12/01/2015   Overview:  atypical CP, [facility] nuclear perfusion imaging w/ no inarct, no ischemia, EF-40% NICM   Chronic systolic heart failure (HCC) 12/01/2015   Overview:  [facility] nuclear perfusion imaging study, no inarct, no ischemia, EF-40% NICM   CVA (cerebral vascular accident) (HCC)    Double vision 07/04/2018   Elevated LFTs    Erectile dysfunction 01/30/2015   cialis  prn    External hemorrhoids    Fitting or adjustment of cardiac pacemaker 06/07/2019   Overview:  [employer] DR [id number], RA and RV  leads [id number]:  [id number] and [id number]   Former smoker 01/30/2015   Former smoker. 20 pack years quit 92. Patient declined AAA screen.     GERD (gastroesophageal reflux disease)    Gout 11/11/2009   Qualifier: Diagnosis of  By: Gardenia Pao     History of nuclear stress test 06/07/2019   Overview:  [facility], no inarct, no ischemia, EF-40% NICM   HTN (hypertension)    Hyperglycemia 06/10/2017   110s CBG 2018   Hyperlipidemia 07/12/2007   Crestor  20mg  daily with some myalgias; fish oil. Trig 200-500 despite this. LDL ok.        Insomnia 01/30/2015   ambien  5mg  prn. May refill.     Ischemic optic neuropathy    Left chest pressure 03/19/2015   Other and unspecified hyperlipidemia    Sinoatrial node dysfunction (HCC)    Spinal stenosis    Trigger finger, right middle finger 03/01/2018   Past Surgical History:  Procedure Laterality Date   ABSCESS DRAINAGE     righ tposterior neck   CARDIAC CATHETERIZATION  1996   CHOLECYSTECTOMY  02/07/2003   COLONOSCOPY  06/23/2004   EYE SURGERY     left eye   left ingunial hernia  06/08/2011   PACEMAKER INSERTION  PPM GENERATOR CHANGEOUT N/A 07/12/2024   Procedure: PPM GENERATOR CHANGEOUT;  Surgeon: Waddell Danelle ORN, MD;  Location: [facility] INVASIVE CV LAB;  Service: Cardiovascular;  Laterality: N/A;   right hip replacement     2012 Dr. Hiram   Patient Active Problem List   Diagnosis Date Noted   Aortic atherosclerosis 08/01/2020   Fatty liver 08/01/2020   Paroxysmal atrial fibrillation (HCC) 07/27/2019   Anticoagulation adequate 06/07/2019   Convergence insufficiency 06/07/2019   Fitting or adjustment of cardiac pacemaker 06/07/2019   Pacemaker-dependent due to native cardiac rhythm insufficient to support life 06/07/2019   Palpitations 06/07/2019   History of nuclear stress test 06/07/2019   S/P cardiac cath 06/07/2019   Non-occlusive coronary artery disease 06/07/2019   Double vision 07/04/2018   Trigger finger, right middle finger 03/01/2018    Hyperglycemia 06/10/2017   Solitary pulmonary nodule 05/26/2016   CAD (coronary artery disease) 05/26/2016   Nonischemic cardiomyopathy (HCC) 04/23/2016   Chronic systolic heart failure (HCC) 12/01/2015   Chest pressure 12/01/2015   Left chest pressure 03/19/2015   Insomnia 01/30/2015   Erectile dysfunction 01/30/2015   Cerebral infarction (HCC) 01/30/2015   Former smoker 01/30/2015   Atrial fibrillation and flutter (HCC) 01/01/2015   Allergic rhinitis 10/11/2014   BPH associated with nocturia 08/14/2014   Ventricular tachycardia, non-sustained (HCC) 01/25/2013   Sinoatrial node dysfunction (HCC)    SPINAL STENOSIS, LUMBAR 08/22/2010   GERD 03/17/2010   Cardiac pacemaker in situ 12/28/2009   Gout 11/11/2009   Hyperlipidemia 07/12/2007   NEUROPATHY, ISCHEMIC OPTIC 06/02/2007   Essential hypertension 06/02/2007    PCP: [name]., MD  REFERRING PROVIDER: [name], DO  REFERRING DIAG: M54.42,M54.41,G89.29 (ICD-10-CM) - Chronic bilateral low back pain with bilateral sciatica M51.362 (ICD-10-CM) - Degeneration of intervertebral disc of lumbar region with discogenic back pain and lower extremity pain  Rationale for Evaluation and Treatment: Rehabilitation  THERAPY DIAG:  Other low back pain  Other abnormalities of gait and mobility  Difficulty in walking, not elsewhere classified  Muscle weakness (generalized)  Balance disorder  Cramp and spasm  Abnormal posture  ONSET DATE: February 2024 first visit to Dr Leonce  SUBJECTIVE:                                                                                                                                                                                           SUBJECTIVE STATEMENT: Patient reports he had increased pain after last treatment session in his hips. The braiding balance exercises were really challenging.    PERTINENT HISTORY:  [name] (next upcoming trip 7/14 for a [employer]  cruise) OA, trigger  fingers, right hip arthroplasty, pacemaker CVA 30 years ago affecting L side  PAIN:  07/27/24 Are you having pain? Yes: NPRS scale: 2/10 Pain location: low back Pain description: discomfort Aggravating factors: worst first thing in the morning or after lying on his couch, swimming Relieving factors: medication, movement  PRECAUTIONS: ICD/Pacemaker  RED FLAGS: None   WEIGHT BEARING RESTRICTIONS: No  FALLS:  Has patient fallen in last 6 months? Yes. Number of falls 1 fall.  Tripped over a chair while traveling at [place]  LIVING ENVIRONMENT: Lives with: lives with their spouse Lives in: House/apartment Stairs: one story Has following equipment at home: None  OCCUPATION: Retired  PLOF: Independent and Leisure: traveling (many upcoming trips planned), swimming  PATIENT GOALS: To feel better and not have to be reliant on pain medication as much and improve balance and flexibility.  NEXT MD VISIT: Follow up with Dr Leonce as needed  OBJECTIVE:  Note: Objective measures were completed at Evaluation unless otherwise noted.  DIAGNOSTIC FINDINGS:  Lumbar CT Scan on 02/04/2023: IMPRESSION: Multilevel degenerative changes which are most severe at the L3-4 level with significant spinal stenosis. No acute traumatic abnormalities identified.  PATIENT SURVEYS:  Modified Oswestry:  MODIFIED OSWESTRY DISABILITY SCALE   Date: 05/26/2024 Score  Pain intensity 2 =  Pain medication provides me with complete relief from pain.  2. Personal care (washing, dressing, etc.) 0 =  I can take care of myself normally without causing increased pain.  3. Lifting 2 = Pain prevents me from lifting heavy weights off the floor, activities (eg. sports, dancing). but I can manage if the weights are conveniently positioned (3) Pain prevents me from going out very often. (eg, on a table).  4. Walking 0 = Pain does not prevent me from walking any distance  5. Sitting 0 =  I can sit in any  chair as long as I like.  6. Standing 1 =  I can stand as long as I want but, it increases my pain.  7. Sleeping 1 = I can sleep well only by using pain medication.  8. Social Life 0 = My social life is normal and does not increase my pain.  9. Traveling 2 =  My pain restricts my travel over 2 hours.  10. Employment/ Homemaking 1 = My normal homemaking/job activities increase my pain, but I can still perform all that is required of me  Total 9 / 50 = 18.0 %   Interpretation of scores: Score Category Description  0-20% Minimal Disability The patient can cope with most living activities. Usually no treatment is indicated apart from advice on lifting, sitting and exercise  21-40% Moderate Disability The patient experiences more pain and difficulty with sitting, lifting and standing. Travel and social life are more difficult and they may be disabled from work. Personal care, sexual activity and sleeping are not grossly affected, and the patient can usually be managed by conservative means  41-60% Severe Disability Pain remains the main problem in this group, but activities of daily living are affected. These patients require a detailed investigation  61-80% Crippled Back pain impinges on all aspects of the patient's life. Positive intervention is required  81-100% Bed-bound  These patients are either bed-bound or exaggerating their symptoms  Bluford FORBES Zoe DELENA Karon DELENA, et al. Surgery versus conservative management of stable thoracolumbar fracture: the PRESTO feasibility RCT. [place] ([place]): [employer]; 2021 Nov. [facility] Technology Assessment, No. 25.62.) Appendix 3, Oswestry Disability Index category descriptors. Available  from: FindJewelers.cz  Minimally Clinically Important Difference (MCID) = 12.8%   8/6/2 ODI 4/50 = 8%  COGNITION: Overall cognitive status: Within functional limits for tasks assessed     SENSATION: Patient states that he has a  neuropathy problem in his feet, but it has improved since starting Gabapentin   MUSCLE LENGTH: Hamstrings: increased tightness bilaterally Piriformis:  tightness bilat  POSTURE: rounded shoulders and forward head   LUMBAR ROM:   Eval:  Decreased by approx 50%  LOWER EXTREMITY ROM:     WFL  LOWER EXTREMITY MMT:    Eval:   LE grossly WNL, but grossly 4+ to 5-/5 in right hip Core strength grossly 4-/5 throughout  LUMBAR SPECIAL TESTS:  Slump test: Negative  FUNCTIONAL TESTS:  Eval: 5 times sit to stand: 11.87 sec Single Leg Stance:  right- 1.53 sec, left- 1.40 sec  GAIT: Distance walked: >500 ft Assistive device utilized: None Level of assistance: Complete Independence Comments: States that he has more difficulty with balance on unlevel surfaces (like grass) than firm surfaces.     MINI-BESTest: Balance Evaluation Systems Test ANTICIPATORY: SIT TO STAND: (2) normal without use of hands and stabilizes independently     (1) Moderate comes to stand WITH use of hands on 1st attempt    (0) Severe: unable to stand up from chair without assistance OR needs several attempts with use of   hands Score:  2  2. RISE TO TOES: feet shoulder width apart. Hands on hips.  Rise as high as you can onto your toes. Try to hold this pose for 3 sec                          (2) stable for 3s with maximum height    (1) heels up, but not full range (smaller than when holding hands) OR noticeable instability for 3 sec                           (0) < or equat to 3 s  Score:  1  3. STAND ON ONE LEG: look straight ahead. Hands on hips. Lift your leg off the ground.     Left:  trial 1 __3__ trial 2__6_                                    Right: Trial 1:___1   Trial 2:____3___   (2) 20 s       (2)  20 s                (1) < 20      (1) < 20 s   (0) Unable      (0) unable Score (use the worst side):  1  REACTIVE POSTURAL CONTROL 4. COMPENSATORY STEPPING CORRECTION FORWARD stand with feet  shoulder width apart, arms at your sides. Lean forward against my hands beyond your forward limits.  When I let go, do whatever is necessary, including taking a step, to avoid a fall   (2) recovers independently  with a single, large step, to avoid a fall   (1) more than one step used to recover equilibrium   (0) No step OR would fall if not caught Score:  0  5. COMPENSATORY STEPPING CORRECTION BACKWARD: Lean backward against my hands beyond your backward limits.  When I let go,  do whatever is necessary, including a step to avoid a fall. (2) recovers independently  with a single, large step, to avoid a fall   (1) more than one step used to recover equilibrium   (0) No step OR would fall if not caught Score:  0  6. COMPENSATORY STEPPING LATERAL: Lean into my hand beyond your sideways limit.  When I let go, do whatever is necessary, including taking a step, to avoid a fall. Left           Right   (2) recovers independently with 1 step (crossover or lateral OK)   (1) several steps to recover equilibrium   (0) falls or cannot step Score:     0  (use the side with the lowest score)  SENSORY ORIENTATION 7. STANCE FEET TOGETHER EYES OPEN  firm surface Be as stable and still as possible until I say stop   (2) 30s   (1) < 30 s   (0) unable Score:  2  8. STANCE ON FOAM EYES CLOSED feet together, eyes closed, hands on hips. Be as stable and still as you can until I stay stop.  I will start timing when you close your eyes. Time in seconds:      (2) 30s   (1) <30 s   (0) unable Score:  1  9. INCLINE EYES CLOSED: Stand on incline with feet shoulder width apart, arms down at your sides.  I will start timing when you close your eyes   (2) Stands 30 s and aligns with gravity   (1) stands < 30 s OR aligns with surface   (0) unable Score:  1  DYNAMIC GAIT 10. CHANGE IN GAIT SPEED: begin walking at your normal speed, when I tell you fast, walk as fast as you can, when I say slow walk very  slowly   (2) significantly changes walking speed without imbalance   (1) unable to change walking speed or signs of imbalance   (0) unable to achieve significant change in walking speed AND signes of imbalance Score: 2  11. WALK WITH HEAD TURNS HORIZONTAL: begin walking at normal speed, when I say right, turn your head to the right, when I say left turn your head to the left.  Try to keep yourself walking in a straight line   (2) performs head turns with no change in gait speed and good balance   (1) performs head turns with reduction in gait speed   (0)  performs head turns with imbalance Score:  2  12. WALK WITH PIVOT TURNS: begin walking at your normal speed.  When I tell you to turn and stop, turn as quickly as you can, face the opposite direction and stop.  After the turn, your feet should be close together   (2) turns with feet close FAST in 3 steps with good balance   (1) turns with feet close SLOW > 4 steps with good balance   (0) cannot turn with feet close at any speed without imbalance Score:2  13. STEP OVER OBSTACLES:  ( 9 inch height 10 feet away from subject) begin walking at your normal speed.  When you get to the box, step over it, not around it and keep walking   (2) Able to step over box with minimal change of gait speed and good balance   (1) Steps over box but touches box OR displays cautious behavior by slowing gait   (0) Unable to step over box  OR steps around box Score:  2  14. TIMED UP AND GO NORMAL AND WITH DUAL TASK: 3 METER WALK: When I say go walk at your your normal speed across the tape, turn around and come back to sit in the chair   Time: Count backwards by 3s starting at    20.  When I say go, stand up from the chair, walk at your normal speed across the tape on the floor, turn around, come back to sit in the chair.  Continue counting backwards the entire time.  Time:   (2) No noticeable change in sitting, standing or walking while backward counting when  compared to TUG without dual task   (1) Dual task affects either counting OR walking (>10%)    (0) stops counting while walking OR stops walking while counting If gait speed slows more than 10% between TUG without and with dual task, the score should be decreased by a point Score: 1  Total score:       17    /28      TREATMENT DATE:  08/23/24 Step ups at stair holding 5# KB x 12 bilateral  Hurdles with 5# KB hold x 3 laps Leg Press (seat 7) 70# 2 x 10 bilateral ; 45# unilateral 2 x 10 Unilateral 10# farmer's carry x 2 laps down long hallways Single leg on therapad + opposite leg swing (forward, lateral, backward) x 8 bilateral  Agility ladder: various patterns, forward, and lateral - backward most challenging Tandem walking by counter (forwards & backwards) x 3 laps Step taps on BOSU holding 8# DB x 10 bilateral   08/21/24 Bike L 3 x 5 min PT present to discuss status, proper adjustments and posture while on machine for optimum benefit Standing quad stretch x 30 sec B Prone quad stretch 2x30 sec B Seated IASTM with roller to B thighs Retro walking x 6 laps - better with bigger step length SBA Braiding x 6 laps at [facility] Single leg cross taps (like braiding) 2x10 B - challenging SBA to CGA Walking, lateral steps, weight shifts on cobblestone pads with SBA Attempted cone touches 3 sets of 5 each LE SBA better with shoes off (cone on back counter) Agility ladder: various patterns, forward, backward and lateral - backward most challenging   07/27/24 Nustep x 5 min level 3 PT present to discuss status, proper adjustments and posture while on machine for optimum benefit Wall pull aways  2 x 10 Staggered stance at wall:  retro stepping with weight shift forward and back 2x10 each LE needed minor cga for lob Standing gastroc stretch 10 x 10 sec B using rocker board as slant board kneeling ant tib stretch 2x30 sec - sitting on heels on mat table (Used [place] bars for balance) Rocker board  x 2 min with UE support Lunge to BOSU x 10 each LE fwd and lateral  Obstacle course with balance pad, cobblestone pads and hurdles: 5 laps with cga Marching with high knees, squats and heel to toe rocking on balance pad x 20 each Attempted cone touches 3 sets of 10 each LE with cga (cone on back counter)  07/25/24 Wall pull aways with back to the wall and using anterior tib muscles and toe extensors to pull body off the wall  Staggered stance at wall:  retro stepping with weight shift forward and back 2x10 needs intermittent UE support to recover balance Standing gastroc stretch 2x30 sec B kneeling ant tib stretch 2x30 sec -  sitting on heels on mat table Seated ankle ROM all planes, circles and alphabet Second set of Staggered stance at wall:  retro stepping with weight shift forward and back 2x10 needs intermittent UE support to recover balance Agility ladder - multiple variations incorporating lateral, fwd, bwd and diagonal movements    PATIENT EDUCATION:  Education details: Issued HEP Person educated: Patient Education method: Explanation, Demonstration, and Handouts Education comprehension: verbalized understanding and returned demonstration  HOME EXERCISE PROGRAM: Access Code: [facility] URL: [email or web address] Date: 07/25/2024 Prepared by: Mliss  Exercises - Seated Transversus Abdominis Bracing  - 1 x daily - 7 x weekly - 2 sets - 10 reps - Seated Hamstring Stretch  - 1 x daily - 7 x weekly - 2 reps - 20 sec hold - Seated Piriformis Stretch  - 1 x daily - 7 x weekly - 2 reps - 20 sec hold - Single Leg Stance with Support  - 1 x daily - 7 x weekly - 2 reps - 20 sec hold - Shoulder External Rotation and Scapular Retraction with Resistance  - 1 x daily - 7 x weekly - 2 sets - 10 reps - Standing Shoulder Horizontal Abduction with Resistance  - 1 x daily - 7 x weekly - 2 sets - 10 reps - Standing Shoulder Row with Anchored Resistance  - 1 x daily - 7 x weekly - 2  sets - 10 reps - Shoulder extension with resistance - Neutral  - 1 x daily - 7 x weekly - 2 sets - 10 reps - Single Leg Balance with Clock Reach  - 1 x daily - 7 x weekly - 10 reps - Standing 'L' Stretch at Counter  - 1 x daily - 7 x weekly - 2 reps - 20 sec hold - Resisted Sit-to-Stand With Dumbbell at Chest  - 1 x daily - 3 x weekly - 2 sets - 10 reps - Side Step Overs with Cones and Unilateral Counter Support  - 1 x daily - 3 x weekly - 2 sets - 10 reps - Standing 3-Way Leg Reach with Resistance at Ankles and Counter Support  - 1 x daily - 3 x weekly - 2 sets - 10 reps - Side Stepping with Resistance at Ankles  - 1 x daily - 3 x weekly - 2 sets - 10 reps - Forward Monster Walk with Resistance at Ankles and Counter Support  - 1 x daily - 3 x weekly - 2 sets - 10 reps - Squat with Chair Touch  - 1 x daily - 3 x weekly - 2 sets - 10 reps - Kneeling Plantarflexion Stretch  - 1 x daily - 3 x weekly - 1 sets - 3 reps - 30 sec hold - Supine Ankle Dorsiflexion and Plantarflexion AROM  - 1 x daily - 7 x weekly - 2 sets - 10 reps - Supine Ankle Inversion and Eversion AROM  - 1 x daily - 7 x weekly - 2 sets - 10 reps - Supine Ankle Circles  - 1 x daily - 7 x weekly - 2 sets - 10 reps - Seated Ankle Alphabet  - 1 x daily - 7 x weekly - 1 sets - 1 reps  ASSESSMENT:  CLINICAL IMPRESSION: Patient presents with increased soreness and pain after last treatment. Treatment session focused on single leg and dynamic balance. When performing step up exercise patient caught foot on stair three times requiring the use of UE support to maintain balance. PT provided  close guarding with exercises. Provided patient education on the three systems involved with balance and how him having neuropathy affects his balance. Patient will benefit from skilled PT to address the below impairments and improve overall function.    OBJECTIVE IMPAIRMENTS: decreased balance, decreased strength, increased muscle spasms, impaired  flexibility, postural dysfunction, and pain.   ACTIVITY LIMITATIONS: lifting, standing, and stairs  PARTICIPATION LIMITATIONS: driving, community activity, and yard work  PERSONAL FACTORS: Time since onset of injury/illness/exacerbation and 1-2 comorbidities: OA, right hip arthroplasty are also affecting patient's functional outcome.   REHAB POTENTIAL: Good  CLINICAL DECISION MAKING: Stable/uncomplicated  EVALUATION COMPLEXITY: Low   GOALS: Goals reviewed with patient? Yes  SHORT TERM GOALS: Target date: 06/16/2024  Patient will be independent with initial HEP. Baseline: Goal status: Met on 05/31/24  2.  Patient will demonstrate and verbalize knowledge of proper lifting techniques to allow him to lift suitcases for travel. Baseline:  Goal status: Ongoing   LONG TERM GOALS: Target date: 10/11/2024    Patient will be independent with advanced HEP to allow for self progression after discharge. Baseline:  Goal status: INITIAL  2.  Patient will improve modified Oswestry to not greater than 5% to demonstrate improved functional mobility. Baseline: 18% Goal status: IN PROGRESS 07/05/24  8%   3.  Patient will increase right hip strength to [facility] to allow him to navigate stairs with reciprocal pattern without difficulty/discomfort. Baseline:  Goal status: MET 07/05/24  4.  Patient will increase bilateral single leg stance to greater than 10 seconds to decrease patients fall risk. Baseline: right- 1.53 sec, left- 1.40 sec Goal status: IN PROGRESS 07/03/24  5.  Patient will report ability to ambulate over various unlevel surfaces for desired length (at least greater than 30 minutes) without a loss of balance to allow patient to travel and sight-see. Baseline:  Goal status: ongoing  6 Patient will improve dynamic balance by using an ankle strategy in order to reduce loss of balance in the posterior direction New  7. Improved mini- best score to 23/28 indicating improved dynamic  balance and decreased risk of falls  PLAN:  PT FREQUENCY: 2x/week  PT DURATION: 12 weeks  (pt will be traveling for a few weeks in September)  PLANNED INTERVENTIONS: 02835- PT Re-evaluation, 97750- Physical Performance Testing, 97110-Therapeutic exercises, 97530- Therapeutic activity, [id number]- Neuromuscular re-education, 97535- Self Care, 02859- Manual therapy, [id number]- Gait training, [phone]- Canalith repositioning, [id number]- Aquatic Therapy, [phone]- Electrical stimulation (unattended), [phone]- Electrical stimulation (manual), [id number]- Ultrasound, [id number]- Traction (mechanical), [id number]- Ionotophoresis 4mg /ml Dexamethasone , [id number] (1-2 muscles), [id number] (3+ muscles)- Dry Needling, Patient/Family education, Balance training, Stair training, Taping, Joint mobilization, Joint manipulation, Spinal manipulation, Spinal mobilization, Cryotherapy, and Moist heat.  PLAN FOR NEXT SESSION: continue with agility ladder with bwd activities, SLS and crossing midline activities, side step, monster walk; dynamic balance (uneven surface, reactive balance);  add anterior tibialis activation ex's to HEP (back to the wall and pull off the wall using ankle strategy, staggered stance rocking for ankle strategy    Kristeen Sar, PT 08/23/24 5:16 PM [facility] [address] [place], [place] [address] Phone # [phone] Fax [phone]

## 2024-08-25 ENCOUNTER — Encounter: Payer: Self-pay | Admitting: Internal Medicine

## 2024-08-25 ENCOUNTER — Ambulatory Visit (INDEPENDENT_AMBULATORY_CARE_PROVIDER_SITE_OTHER)

## 2024-08-25 DIAGNOSIS — I495 Sick sinus syndrome: Secondary | ICD-10-CM

## 2024-08-25 LAB — CUP PACEART REMOTE DEVICE CHECK
Battery Remaining Longevity: 149 mo
Battery Voltage: 3.21 V
Brady Statistic AP VP Percent: 12.41 %
Brady Statistic AP VS Percent: 87.26 %
Brady Statistic AS VP Percent: 0.1 %
Brady Statistic AS VS Percent: 0.23 %
Brady Statistic RA Percent Paced: 99.78 %
Brady Statistic RV Percent Paced: 12.51 %
Date Time Interrogation Session: 20250926043257
Implantable Lead Connection Status: 753985
Implantable Lead Connection Status: 753985
Implantable Lead Implant Date: 20031017
Implantable Lead Implant Date: 20031017
Implantable Lead Location: 753859
Implantable Lead Location: 753860
Implantable Lead Model: 5076
Implantable Lead Model: 5076
Implantable Pulse Generator Implant Date: 20250813
Lead Channel Impedance Value: 323 Ohm
Lead Channel Impedance Value: 323 Ohm
Lead Channel Impedance Value: 3401 Ohm
Lead Channel Impedance Value: 380 Ohm
Lead Channel Pacing Threshold Amplitude: 0.875 V
Lead Channel Pacing Threshold Amplitude: 1.375 V
Lead Channel Pacing Threshold Pulse Width: 0.4 ms
Lead Channel Pacing Threshold Pulse Width: 0.4 ms
Lead Channel Sensing Intrinsic Amplitude: 1.875 mV
Lead Channel Sensing Intrinsic Amplitude: 1.875 mV
Lead Channel Sensing Intrinsic Amplitude: 6.75 mV
Lead Channel Sensing Intrinsic Amplitude: 6.75 mV
Lead Channel Setting Pacing Amplitude: 1.5 V
Lead Channel Setting Pacing Amplitude: 2.75 V
Lead Channel Setting Pacing Pulse Width: 0.4 ms
Lead Channel Setting Sensing Sensitivity: 1.2 mV
Zone Setting Status: 755011

## 2024-08-27 ENCOUNTER — Ambulatory Visit: Payer: Self-pay | Admitting: Internal Medicine

## 2024-08-28 NOTE — Therapy (Signed)
 OUTPATIENT PHYSICAL THERAPY TREATMENT NOTE   Patient Name: [name] MRN: [id number] DOB:21-Feb-1946, 78 y.o., male Today's Date: 08/29/2024  END OF SESSION:  PT End of Session - 08/29/24 1236     Visit Number 14    Date for Recertification  10/11/24    Authorization Type [employer] MCR - 18 visits approved    Authorization Time Period 07/20/24 to 10/11/24    Authorization - Visit Number 14    Authorization - Number of Visits 18    Progress Note Due on Visit 19    PT Start Time 1235    PT Stop Time 1315    PT Time Calculation (min) 40 min    Activity Tolerance Patient tolerated treatment well    Behavior During Therapy [name] for tasks assessed/performed                   Past Medical History:  Diagnosis Date   Abscess    right posterior neck   Anticoagulation adequate 06/07/2019   Overview:  SSS, paroxAFib/Flutter, CHADSvasc= 3 age, h/o cva; chronic OAC Xarelto    Atrial fibrillation and flutter (HCC) 01/01/2015   Noted 2016. Xarelto     BPH associated with nocturia 08/14/2014   Finasteride , flomax .  0-3x nocturia. PSA to 14-->eval urology [facility]. PSA trended back down to about 2.6 per records in 2015 then placed on finasteride - had significant voiding issues when spiked to 14- improved on meds    Bradycardia    CAD (coronary artery disease) 05/26/2016   Coronary CTA_ 50-75% D1 off CT report    Cardiac pacemaker in situ 12/28/2009   SA node dysfunction as cause    Chest pressure 12/01/2015   Overview:  atypical CP, [facility] nuclear perfusion imaging w/ no inarct, no ischemia, EF-40% NICM   Chronic systolic heart failure (HCC) 12/01/2015   Overview:  [facility] nuclear perfusion imaging study, no inarct, no ischemia, EF-40% NICM   CVA (cerebral vascular accident) (HCC)    Double vision 07/04/2018   Elevated LFTs    Erectile dysfunction 01/30/2015   cialis  prn    External hemorrhoids    Fitting or adjustment of cardiac pacemaker 06/07/2019   Overview:  Biotronik Twilight DR [id number], RA and  RV leads [id number]:  [id number] and [id number]   Former smoker 01/30/2015   Former smoker. 20 pack years quit 92. Patient declined AAA screen.     GERD (gastroesophageal reflux disease)    Gout 11/11/2009   Qualifier: Diagnosis of  By: Gardenia Pao     History of nuclear stress test 06/07/2019   Overview:  [facility], no inarct, no ischemia, EF-40% NICM   HTN (hypertension)    Hyperglycemia 06/10/2017   110s CBG 2018   Hyperlipidemia 07/12/2007   Crestor  20mg  daily with some myalgias; fish oil. Trig 200-500 despite this. LDL ok.        Insomnia 01/30/2015   ambien  5mg  prn. May refill.     Ischemic optic neuropathy    Left chest pressure 03/19/2015   Other and unspecified hyperlipidemia    Sinoatrial node dysfunction (HCC)    Spinal stenosis    Trigger finger, right middle finger 03/01/2018   Past Surgical History:  Procedure Laterality Date   ABSCESS DRAINAGE     righ tposterior neck   CARDIAC CATHETERIZATION  1996   CHOLECYSTECTOMY  02/07/2003   COLONOSCOPY  06/23/2004   EYE SURGERY     left eye   left ingunial hernia  06/08/2011   PACEMAKER INSERTION  PPM GENERATOR CHANGEOUT N/A 07/12/2024   Procedure: PPM GENERATOR CHANGEOUT;  Surgeon: Waddell Danelle ORN, MD;  Location: [facility] INVASIVE CV LAB;  Service: Cardiovascular;  Laterality: N/A;   right hip replacement     2012 Dr. Hiram   Patient Active Problem List   Diagnosis Date Noted   Aortic atherosclerosis 08/01/2020   Fatty liver 08/01/2020   Paroxysmal atrial fibrillation (HCC) 07/27/2019   Anticoagulation adequate 06/07/2019   Convergence insufficiency 06/07/2019   Fitting or adjustment of cardiac pacemaker 06/07/2019   Pacemaker-dependent due to native cardiac rhythm insufficient to support life 06/07/2019   Palpitations 06/07/2019   History of nuclear stress test 06/07/2019   S/P cardiac cath 06/07/2019   Non-occlusive coronary artery disease 06/07/2019   Double vision 07/04/2018   Trigger finger, right middle finger  03/01/2018   Hyperglycemia 06/10/2017   Solitary pulmonary nodule 05/26/2016   CAD (coronary artery disease) 05/26/2016   Nonischemic cardiomyopathy (HCC) 04/23/2016   Chronic systolic heart failure (HCC) 12/01/2015   Chest pressure 12/01/2015   Left chest pressure 03/19/2015   Insomnia 01/30/2015   Erectile dysfunction 01/30/2015   Cerebral infarction (HCC) 01/30/2015   Former smoker 01/30/2015   Atrial fibrillation and flutter (HCC) 01/01/2015   Allergic rhinitis 10/11/2014   BPH associated with nocturia 08/14/2014   Ventricular tachycardia, non-sustained (HCC) 01/25/2013   Sinoatrial node dysfunction (HCC)    SPINAL STENOSIS, LUMBAR 08/22/2010   GERD 03/17/2010   Cardiac pacemaker in situ 12/28/2009   Gout 11/11/2009   Hyperlipidemia 07/12/2007   NEUROPATHY, ISCHEMIC OPTIC 06/02/2007   Essential hypertension 06/02/2007    PCP: [name]., MD  REFERRING PROVIDER: [name], DO  REFERRING DIAG: M54.42,M54.41,G89.29 (ICD-10-CM) - Chronic bilateral low back pain with bilateral sciatica M51.362 (ICD-10-CM) - Degeneration of intervertebral disc of lumbar region with discogenic back pain and lower extremity pain  Rationale for Evaluation and Treatment: Rehabilitation  THERAPY DIAG:  Other low back pain  Other abnormalities of gait and mobility  Difficulty in walking, not elsewhere classified  Muscle weakness (generalized)  ONSET DATE: February 2024 first visit to Dr Leonce  SUBJECTIVE:                                                                                                                                                                                           SUBJECTIVE STATEMENT: I'm taking chair yoga 2 days per wk and strength and balance one day.    PERTINENT HISTORY:  [name] (next upcoming trip 7/14 for a [place] cruise) OA, trigger fingers, right hip arthroplasty, pacemaker CVA 30 years ago affecting L side  PAIN:   07/27/24  Are you having pain? Yes: NPRS scale: 2/10 Pain location: low back Pain description: discomfort Aggravating factors: worst first thing in the morning or after lying on his couch, swimming Relieving factors: medication, movement  PRECAUTIONS: ICD/Pacemaker  RED FLAGS: None   WEIGHT BEARING RESTRICTIONS: No  FALLS:  Has patient fallen in last 6 months? Yes. Number of falls 1 fall.  Tripped over a chair while traveling at [place]  LIVING ENVIRONMENT: Lives with: lives with their spouse Lives in: House/apartment Stairs: one story Has following equipment at home: None  OCCUPATION: Retired  PLOF: Independent and Leisure: traveling (many upcoming trips planned), swimming  PATIENT GOALS: To feel better and not have to be reliant on pain medication as much and improve balance and flexibility.  NEXT MD VISIT: Follow up with Dr Leonce as needed  OBJECTIVE:  Note: Objective measures were completed at Evaluation unless otherwise noted.  DIAGNOSTIC FINDINGS:  Lumbar CT Scan on 02/04/2023: IMPRESSION: Multilevel degenerative changes which are most severe at the L3-4 level with significant spinal stenosis. No acute traumatic abnormalities identified.  PATIENT SURVEYS:  Modified Oswestry:  MODIFIED OSWESTRY DISABILITY SCALE   Date: 05/26/2024 Score  Pain intensity 2 =  Pain medication provides me with complete relief from pain.  2. Personal care (washing, dressing, etc.) 0 =  I can take care of myself normally without causing increased pain.  3. Lifting 2 = Pain prevents me from lifting heavy weights off the floor, activities (eg. sports, dancing). but I can manage if the weights are conveniently positioned (3) Pain prevents me from going out very often. (eg, on a table).  4. Walking 0 = Pain does not prevent me from walking any distance  5. Sitting 0 =  I can sit in any chair as long as I like.  6. Standing 1 =  I can stand as long as I want but, it increases my pain.  7.  Sleeping 1 = I can sleep well only by using pain medication.  8. Social Life 0 = My social life is normal and does not increase my pain.  9. Traveling 2 =  My pain restricts my travel over 2 hours.  10. Employment/ Homemaking 1 = My normal homemaking/job activities increase my pain, but I can still perform all that is required of me  Total 9 / 50 = 18.0 %   Interpretation of scores: Score Category Description  0-20% Minimal Disability The patient can cope with most living activities. Usually no treatment is indicated apart from advice on lifting, sitting and exercise  21-40% Moderate Disability The patient experiences more pain and difficulty with sitting, lifting and standing. Travel and social life are more difficult and they may be disabled from work. Personal care, sexual activity and sleeping are not grossly affected, and the patient can usually be managed by conservative means  41-60% Severe Disability Pain remains the main problem in this group, but activities of daily living are affected. These patients require a detailed investigation  61-80% Crippled Back pain impinges on all aspects of the patient's life. Positive intervention is required  81-100% Bed-bound  These patients are either bed-bound or exaggerating their symptoms  Bluford FORBES Zoe DELENA Karon DELENA, et al. Surgery versus conservative management of stable thoracolumbar fracture: the PRESTO feasibility RCT. [place] ([place]): [employer]; 2021 Nov. [facility] [employer] Assessment, No. 25.62.) Appendix 3, Oswestry Disability Index category descriptors. Available from: FindJewelers.cz  Minimally Clinically Important Difference (MCID) = 12.8%   8/6/2 ODI 4/50 =  8%  COGNITION: Overall cognitive status: Within functional limits for tasks assessed     SENSATION: Patient states that he has a neuropathy problem in his feet, but it has improved since starting Gabapentin   MUSCLE  LENGTH: Hamstrings: increased tightness bilaterally Piriformis:  tightness bilat  POSTURE: rounded shoulders and forward head   LUMBAR ROM:   Eval:  Decreased by approx 50%  LOWER EXTREMITY ROM:     WFL  LOWER EXTREMITY MMT:    Eval:   LE grossly WNL, but grossly 4+ to 5-/5 in right hip Core strength grossly 4-/5 throughout  LUMBAR SPECIAL TESTS:  Slump test: Negative  FUNCTIONAL TESTS:  Eval: 5 times sit to stand: 11.87 sec Single Leg Stance:  right- 1.53 sec, left- 1.40 sec  GAIT: Distance walked: >500 ft Assistive device utilized: None Level of assistance: Complete Independence Comments: States that he has more difficulty with balance on unlevel surfaces (like grass) than firm surfaces.     MINI-BESTest: Balance Evaluation Systems Test ANTICIPATORY: SIT TO STAND: (2) normal without use of hands and stabilizes independently     (1) Moderate comes to stand WITH use of hands on 1st attempt    (0) Severe: unable to stand up from chair without assistance OR needs several attempts with use of   hands Score:  2  2. RISE TO TOES: feet shoulder width apart. Hands on hips.  Rise as high as you can onto your toes. Try to hold this pose for 3 sec                          (2) stable for 3s with maximum height    (1) heels up, but not full range (smaller than when holding hands) OR noticeable instability for 3 sec                           (0) < or equat to 3 s  Score:  1  3. STAND ON ONE LEG: look straight ahead. Hands on hips. Lift your leg off the ground.     Left:  trial 1 __3__ trial 2__6_                                    Right: Trial 1:___1   Trial 2:____3___   (2) 20 s       (2)  20 s                (1) < 20      (1) < 20 s   (0) Unable      (0) unable Score (use the worst side):  1  REACTIVE POSTURAL CONTROL 4. COMPENSATORY STEPPING CORRECTION FORWARD stand with feet shoulder width apart, arms at your sides. Lean forward against my hands beyond your forward  limits.  When I let go, do whatever is necessary, including taking a step, to avoid a fall   (2) recovers independently  with a single, large step, to avoid a fall   (1) more than one step used to recover equilibrium   (0) No step OR would fall if not caught Score:  0  5. COMPENSATORY STEPPING CORRECTION BACKWARD: Lean backward against my hands beyond your backward limits.  When I let go, do whatever is necessary, including a step to avoid a fall. (2) recovers independently  with  a single, large step, to avoid a fall   (1) more than one step used to recover equilibrium   (0) No step OR would fall if not caught Score:  0  6. COMPENSATORY STEPPING LATERAL: Lean into my hand beyond your sideways limit.  When I let go, do whatever is necessary, including taking a step, to avoid a fall. Left           Right   (2) recovers independently with 1 step (crossover or lateral OK)   (1) several steps to recover equilibrium   (0) falls or cannot step Score:     0  (use the side with the lowest score)  SENSORY ORIENTATION 7. STANCE FEET TOGETHER EYES OPEN  firm surface Be as stable and still as possible until I say stop   (2) 30s   (1) < 30 s   (0) unable Score:  2  8. STANCE ON FOAM EYES CLOSED feet together, eyes closed, hands on hips. Be as stable and still as you can until I stay stop.  I will start timing when you close your eyes. Time in seconds:      (2) 30s   (1) <30 s   (0) unable Score:  1  9. INCLINE EYES CLOSED: Stand on incline with feet shoulder width apart, arms down at your sides.  I will start timing when you close your eyes   (2) Stands 30 s and aligns with gravity   (1) stands < 30 s OR aligns with surface   (0) unable Score:  1  DYNAMIC GAIT 10. CHANGE IN GAIT SPEED: begin walking at your normal speed, when I tell you fast, walk as fast as you can, when I say slow walk very slowly   (2) significantly changes walking speed without imbalance   (1) unable to change  walking speed or signs of imbalance   (0) unable to achieve significant change in walking speed AND signes of imbalance Score: 2  11. WALK WITH HEAD TURNS HORIZONTAL: begin walking at normal speed, when I say right, turn your head to the right, when I say left turn your head to the left.  Try to keep yourself walking in a straight line   (2) performs head turns with no change in gait speed and good balance   (1) performs head turns with reduction in gait speed   (0)  performs head turns with imbalance Score:  2  12. WALK WITH PIVOT TURNS: begin walking at your normal speed.  When I tell you to turn and stop, turn as quickly as you can, face the opposite direction and stop.  After the turn, your feet should be close together   (2) turns with feet close FAST in 3 steps with good balance   (1) turns with feet close SLOW > 4 steps with good balance   (0) cannot turn with feet close at any speed without imbalance Score:2  13. STEP OVER OBSTACLES:  ( 9 inch height 10 feet away from subject) begin walking at your normal speed.  When you get to the box, step over it, not around it and keep walking   (2) Able to step over box with minimal change of gait speed and good balance   (1) Steps over box but touches box OR displays cautious behavior by slowing gait   (0) Unable to step over box OR steps around box Score:  2  14. TIMED UP AND GO NORMAL AND WITH  DUAL TASK: 3 METER WALK: When I say go walk at your your normal speed across the tape, turn around and come back to sit in the chair   Time: Count backwards by 3s starting at    20.  When I say go, stand up from the chair, walk at your normal speed across the tape on the floor, turn around, come back to sit in the chair.  Continue counting backwards the entire time.  Time:   (2) No noticeable change in sitting, standing or walking while backward counting when compared to TUG without dual task   (1) Dual task affects either counting OR walking  (>10%)    (0) stops counting while walking OR stops walking while counting If gait speed slows more than 10% between TUG without and with dual task, the score should be decreased by a point Score: 1  Total score:       17    /28      TREATMENT DATE:  08/29/24 Bike L3 x PT present to discuss status Step ups at stair holding 5# KB x 12 bilateral  Hurdles with 5# KB hold x 3 laps Leg Press (seat 7) 70# 2 x 10 bilateral ; 45# unilateral 2 x 10 Unilateral 10# farmer's carry x 2 laps down long hallways Single leg on therapad + opposite leg swing (forward, lateral, backward) x 8 bilateral  Floor dots various patterns, forward, and lateral - backward most challenging Step taps on BOSU holding 8# DB x 10 bilateral  Single leg cross taps (like braiding) 2x10 B - challenging SBA to CGA   08/23/24 Step ups at stair holding 5# KB x 12 bilateral  Hurdles with 5# KB hold x 3 laps Leg Press (seat 7) 70# 2 x 10 bilateral ; 45# unilateral 2 x 10 Unilateral 10# farmer's carry x 2 laps down long hallways Single leg on therapad + opposite leg swing (forward, lateral, backward) x 8 bilateral  Agility ladder: various patterns, forward, and lateral - backward most challenging Tandem walking by counter (forwards & backwards) x 3 laps Step taps on BOSU holding 8# DB x 10 bilateral   08/21/24 Bike L 3 x 5 min PT present to discuss status, proper adjustments and posture while on machine for optimum benefit Standing quad stretch x 30 sec B Prone quad stretch 2x30 sec B Seated IASTM with roller to B thighs Retro walking x 6 laps - better with bigger step length SBA Braiding x 6 laps at [facility] Single leg cross taps (like braiding) 2x10 B - challenging SBA to CGA Walking, lateral steps, weight shifts on cobblestone pads with SBA Attempted cone touches 3 sets of 5 each LE SBA better with shoes off (cone on back counter) Agility ladder: various patterns, forward, backward and lateral - backward most  challenging   07/27/24 Nustep x 5 min level 3 PT present to discuss status, proper adjustments and posture while on machine for optimum benefit Wall pull aways  2 x 10 Staggered stance at wall:  retro stepping with weight shift forward and back 2x10 each LE needed minor cga for lob Standing gastroc stretch 10 x 10 sec B using rocker board as slant board kneeling ant tib stretch 2x30 sec - sitting on heels on mat table (Used [place] bars for balance) Rocker board x 2 min with UE support Lunge to BOSU x 10 each LE fwd and lateral  Obstacle course with balance pad, cobblestone pads and hurdles: 5 laps with  cga Marching with high knees, squats and heel to toe rocking on balance pad x 20 each Attempted cone touches 3 sets of 10 each LE with cga (cone on back counter)  07/25/24 Wall pull aways with back to the wall and using anterior tib muscles and toe extensors to pull body off the wall  Staggered stance at wall:  retro stepping with weight shift forward and back 2x10 needs intermittent UE support to recover balance Standing gastroc stretch 2x30 sec B kneeling ant tib stretch 2x30 sec - sitting on heels on mat table Seated ankle ROM all planes, circles and alphabet Second set of Staggered stance at wall:  retro stepping with weight shift forward and back 2x10 needs intermittent UE support to recover balance Agility ladder - multiple variations incorporating lateral, fwd, bwd and diagonal movements    PATIENT EDUCATION:  Education details: Issued HEP Person educated: Patient Education method: Explanation, Demonstration, and Handouts Education comprehension: verbalized understanding and returned demonstration  HOME EXERCISE PROGRAM: Access Code: [facility] URL: [email or web address] Date: 07/25/2024 Prepared by: Mliss  Exercises - Seated Transversus Abdominis Bracing  - 1 x daily - 7 x weekly - 2 sets - 10 reps - Seated Hamstring Stretch  - 1 x daily - 7 x weekly - 2 reps -  20 sec hold - Seated Piriformis Stretch  - 1 x daily - 7 x weekly - 2 reps - 20 sec hold - Single Leg Stance with Support  - 1 x daily - 7 x weekly - 2 reps - 20 sec hold - Shoulder External Rotation and Scapular Retraction with Resistance  - 1 x daily - 7 x weekly - 2 sets - 10 reps - Standing Shoulder Horizontal Abduction with Resistance  - 1 x daily - 7 x weekly - 2 sets - 10 reps - Standing Shoulder Row with Anchored Resistance  - 1 x daily - 7 x weekly - 2 sets - 10 reps - Shoulder extension with resistance - Neutral  - 1 x daily - 7 x weekly - 2 sets - 10 reps - Single Leg Balance with Clock Reach  - 1 x daily - 7 x weekly - 10 reps - Standing 'L' Stretch at Counter  - 1 x daily - 7 x weekly - 2 reps - 20 sec hold - Resisted Sit-to-Stand With Dumbbell at Chest  - 1 x daily - 3 x weekly - 2 sets - 10 reps - Side Step Overs with Cones and Unilateral Counter Support  - 1 x daily - 3 x weekly - 2 sets - 10 reps - Standing 3-Way Leg Reach with Resistance at Ankles and Counter Support  - 1 x daily - 3 x weekly - 2 sets - 10 reps - Side Stepping with Resistance at Ankles  - 1 x daily - 3 x weekly - 2 sets - 10 reps - Forward Monster Walk with Resistance at Ankles and Counter Support  - 1 x daily - 3 x weekly - 2 sets - 10 reps - Squat with Chair Touch  - 1 x daily - 3 x weekly - 2 sets - 10 reps - Kneeling Plantarflexion Stretch  - 1 x daily - 3 x weekly - 1 sets - 3 reps - 30 sec hold - Supine Ankle Dorsiflexion and Plantarflexion AROM  - 1 x daily - 7 x weekly - 2 sets - 10 reps - Supine Ankle Inversion and Eversion AROM  - 1 x daily - 7 x weekly -  2 sets - 10 reps - Supine Ankle Circles  - 1 x daily - 7 x weekly - 2 sets - 10 reps - Seated Ankle Alphabet  - 1 x daily - 7 x weekly - 1 sets - 1 reps  ASSESSMENT:  CLINICAL IMPRESSION: [facility] is doing in PT. He demonstrated improved balance with step tap activities today with  no LOB and only one incidence of catching his foot. He still is most challenged with backward activities and with those that cross midline.    OBJECTIVE IMPAIRMENTS: decreased balance, decreased strength, increased muscle spasms, impaired flexibility, postural dysfunction, and pain.   ACTIVITY LIMITATIONS: lifting, standing, and stairs  PARTICIPATION LIMITATIONS: driving, community activity, and yard work  PERSONAL FACTORS: Time since onset of injury/illness/exacerbation and 1-2 comorbidities: OA, right hip arthroplasty are also affecting patient's functional outcome.   REHAB POTENTIAL: Good  CLINICAL DECISION MAKING: Stable/uncomplicated  EVALUATION COMPLEXITY: Low   GOALS: Goals reviewed with patient? Yes  SHORT TERM GOALS: Target date: 06/16/2024  Patient will be independent with initial HEP. Baseline: Goal status: Met on 05/31/24  2.  Patient will demonstrate and verbalize knowledge of proper lifting techniques to allow him to lift suitcases for travel. Baseline:  Goal status: Ongoing   LONG TERM GOALS: Target date: 10/11/2024    Patient will be independent with advanced HEP to allow for self progression after discharge. Baseline:  Goal status: INITIAL  2.  Patient will improve modified Oswestry to not greater than 5% to demonstrate improved functional mobility. Baseline: 18% Goal status: IN PROGRESS 07/05/24  8%   3.  Patient will increase right hip strength to [facility] to allow him to navigate stairs with reciprocal pattern without difficulty/discomfort. Baseline:  Goal status: MET 07/05/24  4.  Patient will increase bilateral single leg stance to greater than 10 seconds to decrease patients fall risk. Baseline: right- 1.53 sec, left- 1.40 sec Goal status: IN PROGRESS 07/03/24  5.  Patient will report ability to ambulate over various unlevel surfaces for desired length (at least greater than 30 minutes) without a loss of balance to allow patient to travel and  sight-see. Baseline:  Goal status: ongoing  6 Patient will improve dynamic balance by using an ankle strategy in order to reduce loss of balance in the posterior direction New  7. Improved mini- best score to 23/28 indicating improved dynamic balance and decreased risk of falls  PLAN:  PT FREQUENCY: 2x/week  PT DURATION: 12 weeks  (pt will be traveling for a few weeks in September)  PLANNED INTERVENTIONS: 02835- PT Re-evaluation, 97750- Physical Performance Testing, 97110-Therapeutic exercises, 97530- Therapeutic activity, [id number]- Neuromuscular re-education, 97535- Self Care, 02859- Manual therapy, [id number]- Gait training, [phone]- Canalith repositioning, [id number]- Aquatic Therapy, [phone]- Electrical stimulation (unattended), [phone]- Electrical stimulation (manual), [id number]- Ultrasound, [id number]- Traction (mechanical), [id number]- Ionotophoresis 4mg /ml Dexamethasone , [id number] (1-2 muscles), [id number] (3+ muscles)- Dry Needling, Patient/Family education, Balance training, Stair training, Taping, Joint mobilization, Joint manipulation, Spinal manipulation, Spinal mobilization, Cryotherapy, and Moist heat.  PLAN FOR NEXT SESSION: continue with agility ladder with bwd activities, SLS and crossing midline activities, side step, monster walk; dynamic balance (uneven surface, reactive balance);  add anterior tibialis activation ex's to HEP (back to the wall and pull off the wall using ankle strategy, staggered stance rocking for ankle strategy    Mliss Cummins, PT  08/29/24 1:19 PM [facility] [address] [place], [place]  [address] Phone # [phone] Fax [phone]

## 2024-08-29 ENCOUNTER — Encounter: Payer: Self-pay | Admitting: Physical Therapy

## 2024-08-29 ENCOUNTER — Ambulatory Visit: Admitting: Physical Therapy

## 2024-08-29 DIAGNOSIS — R262 Difficulty in walking, not elsewhere classified: Secondary | ICD-10-CM

## 2024-08-29 DIAGNOSIS — R2689 Other abnormalities of gait and mobility: Secondary | ICD-10-CM | POA: Diagnosis not present

## 2024-08-29 DIAGNOSIS — R252 Cramp and spasm: Secondary | ICD-10-CM | POA: Diagnosis not present

## 2024-08-29 DIAGNOSIS — M5459 Other low back pain: Secondary | ICD-10-CM

## 2024-08-29 DIAGNOSIS — M6281 Muscle weakness (generalized): Secondary | ICD-10-CM

## 2024-08-29 DIAGNOSIS — R293 Abnormal posture: Secondary | ICD-10-CM | POA: Diagnosis not present

## 2024-08-30 DIAGNOSIS — C4491 Basal cell carcinoma of skin, unspecified: Secondary | ICD-10-CM | POA: Diagnosis not present

## 2024-08-30 NOTE — Progress Notes (Signed)
 Remote PPM Transmission

## 2024-09-03 NOTE — Therapy (Signed)
 OUTPATIENT PHYSICAL THERAPY TREATMENT NOTE   Patient Name: [name] MRN: [id number] DOB:03-23-1946, 78 y.o., male Today's Date: 09/04/2024  END OF SESSION:  PT End of Session - 09/04/24 1335     Visit Number 15    Date for Recertification  10/11/24    Authorization Type [employer] MCR - 18 visits approved    Authorization Time Period 07/20/24 to 10/11/24    Authorization - Visit Number 15    Authorization - Number of Visits 18    Progress Note Due on Visit 19    PT Start Time 1235    PT Stop Time 1315    PT Time Calculation (min) 40 min    Activity Tolerance Patient tolerated treatment well    Behavior During Therapy [facility] for tasks assessed/performed                    Past Medical History:  Diagnosis Date   Abscess    right posterior neck   Anticoagulation adequate 06/07/2019   Overview:  SSS, paroxAFib/Flutter, CHADSvasc= 3 age, h/o cva; chronic OAC Xarelto    Atrial fibrillation and flutter (HCC) 01/01/2015   Noted 2016. Xarelto     BPH associated with nocturia 08/14/2014   Finasteride , flomax .  0-3x nocturia. PSA to 14-->eval urology [facility]. PSA trended back down to about 2.6 per records in 2015 then placed on finasteride - had significant voiding issues when spiked to 14- improved on meds    Bradycardia    CAD (coronary artery disease) 05/26/2016   Coronary CTA_ 50-75% D1 off CT report    Cardiac pacemaker in situ 12/28/2009   SA node dysfunction as cause    Chest pressure 12/01/2015   Overview:  atypical CP, [facility] nuclear perfusion imaging w/ no inarct, no ischemia, EF-40% NICM   Chronic systolic heart failure (HCC) 12/01/2015   Overview:  [facility] nuclear perfusion imaging study, no inarct, no ischemia, EF-40% NICM   CVA (cerebral vascular accident) (HCC)    Double vision 07/04/2018   Elevated LFTs    Erectile dysfunction 01/30/2015   cialis  prn    External hemorrhoids    Fitting or adjustment of cardiac pacemaker 06/07/2019   Overview:  [employer] [place] DR [id number], RA and  RV leads [id number]:  [id number] and [id number]   Former smoker 01/30/2015   Former smoker. 20 pack years quit 92. Patient declined AAA screen.     GERD (gastroesophageal reflux disease)    Gout 11/11/2009   Qualifier: Diagnosis of  By: [name]     History of nuclear stress test 06/07/2019   Overview:  [facility], no inarct, no ischemia, EF-40% NICM   HTN (hypertension)    Hyperglycemia 06/10/2017   110s CBG 2018   Hyperlipidemia 07/12/2007   Crestor  20mg  daily with some myalgias; fish oil. Trig 200-500 despite this. LDL ok.        Insomnia 01/30/2015   ambien  5mg  prn. May refill.     Ischemic optic neuropathy    Left chest pressure 03/19/2015   Other and unspecified hyperlipidemia    Sinoatrial node dysfunction (HCC)    Spinal stenosis    Trigger finger, right middle finger 03/01/2018   Past Surgical History:  Procedure Laterality Date   ABSCESS DRAINAGE     righ tposterior neck   CARDIAC CATHETERIZATION  1996   CHOLECYSTECTOMY  02/07/2003   COLONOSCOPY  06/23/2004   EYE SURGERY     left eye   left ingunial hernia  06/08/2011   PACEMAKER INSERTION  PPM GENERATOR CHANGEOUT N/A 07/12/2024   Procedure: PPM GENERATOR CHANGEOUT;  Surgeon: Nathan Danelle ORN, MD;  Location: [facility] INVASIVE CV LAB;  Service: Cardiovascular;  Laterality: N/A;   right hip replacement     2012 Dr. Hiram   Patient Active Problem List   Diagnosis Date Noted   Aortic atherosclerosis 08/01/2020   Fatty liver 08/01/2020   Paroxysmal atrial fibrillation (HCC) 07/27/2019   Anticoagulation adequate 06/07/2019   Convergence insufficiency 06/07/2019   Fitting or adjustment of cardiac pacemaker 06/07/2019   Pacemaker-dependent due to native cardiac rhythm insufficient to support life 06/07/2019   Palpitations 06/07/2019   History of nuclear stress test 06/07/2019   S/P cardiac cath 06/07/2019   Non-occlusive coronary artery disease 06/07/2019   Double vision 07/04/2018   Trigger finger, right middle finger  03/01/2018   Hyperglycemia 06/10/2017   Solitary pulmonary nodule 05/26/2016   CAD (coronary artery disease) 05/26/2016   Nonischemic cardiomyopathy (HCC) 04/23/2016   Chronic systolic heart failure (HCC) 12/01/2015   Chest pressure 12/01/2015   Left chest pressure 03/19/2015   Insomnia 01/30/2015   Erectile dysfunction 01/30/2015   Cerebral infarction (HCC) 01/30/2015   Former smoker 01/30/2015   Atrial fibrillation and flutter (HCC) 01/01/2015   Allergic rhinitis 10/11/2014   BPH associated with nocturia 08/14/2014   Ventricular tachycardia, non-sustained (HCC) 01/25/2013   Sinoatrial node dysfunction (HCC)    SPINAL STENOSIS, LUMBAR 08/22/2010   GERD 03/17/2010   Cardiac pacemaker in situ 12/28/2009   Gout 11/11/2009   Hyperlipidemia 07/12/2007   NEUROPATHY, ISCHEMIC OPTIC 06/02/2007   Essential hypertension 06/02/2007    PCP: [name]., MD  REFERRING PROVIDER: [name], DO  REFERRING DIAG: M54.42,M54.41,G89.29 (ICD-10-CM) - Chronic bilateral low back pain with bilateral sciatica M51.362 (ICD-10-CM) - Degeneration of intervertebral disc of lumbar region with discogenic back pain and lower extremity pain  Rationale for Evaluation and Treatment: Rehabilitation  THERAPY DIAG:  Other low back pain  Other abnormalities of gait and mobility  Difficulty in walking, not elsewhere classified  Muscle weakness (generalized)  Balance disorder  Cramp and spasm  Abnormal posture  ONSET DATE: February 2024 first visit to Dr Nathan  SUBJECTIVE:                                                                                                                                                                                           SUBJECTIVE STATEMENT: My classes are killing me.   PERTINENT HISTORY:  [name] frequently (next upcoming trip 7/14 for a [place] cruise) OA, trigger fingers, right hip arthroplasty, pacemaker CVA 30 years ago affecting L  side  PAIN:  07/27/24  Are you having pain? Yes: NPRS scale: 2/10 Pain location: low back Pain description: discomfort Aggravating factors: worst first thing in the morning or after lying on his couch, swimming Relieving factors: medication, movement  PRECAUTIONS: ICD/Pacemaker  RED FLAGS: None   WEIGHT BEARING RESTRICTIONS: No  FALLS:  Has patient fallen in last 6 months? Yes. Number of falls 1 fall.  Tripped over a chair while traveling at [place]  LIVING ENVIRONMENT: Lives with: lives with their spouse Lives in: House/apartment Stairs: one story Has following equipment at home: None  OCCUPATION: Retired  PLOF: Independent and Leisure: traveling (many upcoming trips planned), swimming  PATIENT GOALS: To feel better and not have to be reliant on pain medication as much and improve balance and flexibility.  NEXT MD VISIT: Follow up with Dr Nathan as needed  OBJECTIVE:  Note: Objective measures were completed at Evaluation unless otherwise noted.  DIAGNOSTIC FINDINGS:  Lumbar CT Scan on 02/04/2023: IMPRESSION: Multilevel degenerative changes which are most severe at the L3-4 level with significant spinal stenosis. No acute traumatic abnormalities identified.  PATIENT SURVEYS:  Modified Oswestry:  MODIFIED OSWESTRY DISABILITY SCALE   Date: 05/26/2024 Score  Pain intensity 2 =  Pain medication provides me with complete relief from pain.  2. Personal care (washing, dressing, etc.) 0 =  I can take care of myself normally without causing increased pain.  3. Lifting 2 = Pain prevents me from lifting heavy weights off the floor, activities (eg. sports, dancing). but I can manage if the weights are conveniently positioned (3) Pain prevents me from going out very often. (eg, on a table).  4. Walking 0 = Pain does not prevent me from walking any distance  5. Sitting 0 =  I can sit in any chair as long as I like.  6. Standing 1 =  I can stand as long as I want but, it increases my  pain.  7. Sleeping 1 = I can sleep well only by using pain medication.  8. Social Life 0 = My social life is normal and does not increase my pain.  9. Traveling 2 =  My pain restricts my travel over 2 hours.  10. Employment/ Homemaking 1 = My normal homemaking/job activities increase my pain, but I can still perform all that is required of me  Total 9 / 50 = 18.0 %   Interpretation of scores: Score Category Description  0-20% Minimal Disability The patient can cope with most living activities. Usually no treatment is indicated apart from advice on lifting, sitting and exercise  21-40% Moderate Disability The patient experiences more pain and difficulty with sitting, lifting and standing. Travel and social life are more difficult and they may be disabled from work. Personal care, sexual activity and sleeping are not grossly affected, and the patient can usually be managed by conservative means  41-60% Severe Disability Pain remains the main problem in this group, but activities of daily living are affected. These patients require a detailed investigation  61-80% Crippled Back pain impinges on all aspects of the patient's life. Positive intervention is required  81-100% Bed-bound  These patients are either bed-bound or exaggerating their symptoms  Bluford FORBES Zoe DELENA Karon DELENA, et al. Surgery versus conservative management of stable thoracolumbar fracture: the PRESTO feasibility RCT. [place] ([place]): [employer]; 2021 Nov. [facility] Technology Assessment, No. 25.62.) Appendix 3, Oswestry Disability Index category descriptors. Available from: FindJewel[employer].cz  Minimally Clinically Important Difference (MCID) = 12.8%   8/6/2 ODI 4/50 =  8%  COGNITION: Overall cognitive status: Within functional limits for tasks assessed     SENSATION: Patient states that he has a neuropathy problem in his feet, but it has improved since starting Gabapentin   MUSCLE  LENGTH: Hamstrings: increased tightness bilaterally Piriformis:  tightness bilat  POSTURE: rounded shoulders and forward head   LUMBAR ROM:   Eval:  Decreased by approx 50%  LOWER EXTREMITY ROM:     WFL  LOWER EXTREMITY MMT:    Eval:   LE grossly WNL, but grossly 4+ to 5-/5 in right hip Core strength grossly 4-/5 throughout  LUMBAR SPECIAL TESTS:  Slump test: Negative  FUNCTIONAL TESTS:  Eval: 5 times sit to stand: 11.87 sec Single Leg Stance:  right- 1.53 sec, left- 1.40 sec  09/04/24  SLS R 4 sec; L 5 sec  GAIT: Distance walked: >500 ft Assistive device utilized: None Level of assistance: Complete Independence Comments: States that he has more difficulty with balance on unlevel surfaces (like grass) than firm surfaces.     MINI-BESTest: Balance Evaluation Systems Test ANTICIPATORY: SIT TO STAND: (2) normal without use of hands and stabilizes independently     (1) Moderate comes to stand WITH use of hands on 1st attempt    (0) Severe: unable to stand up from chair without assistance OR needs several attempts with use of   hands Score:  2  2. RISE TO TOES: feet shoulder width apart. Hands on hips.  Rise as high as you can onto your toes. Try to hold this pose for 3 sec                          (2) stable for 3s with maximum height    (1) heels up, but not full range (smaller than when holding hands) OR noticeable instability for 3 sec                           (0) < or equat to 3 s  Score:  1  3. STAND ON ONE LEG: look straight ahead. Hands on hips. Lift your leg off the ground.     Left:  trial 1 __3__ trial 2__6_                                    Right: Trial 1:___1   Trial 2:____3___   (2) 20 s       (2)  20 s                (1) < 20      (1) < 20 s   (0) Unable      (0) unable Score (use the worst side):  1  REACTIVE POSTURAL CONTROL 4. COMPENSATORY STEPPING CORRECTION FORWARD stand with feet shoulder width apart, arms at your sides. Lean forward  against my hands beyond your forward limits.  When I let go, do whatever is necessary, including taking a step, to avoid a fall   (2) recovers independently  with a single, large step, to avoid a fall   (1) more than one step used to recover equilibrium   (0) No step OR would fall if not caught Score:  0  5. COMPENSATORY STEPPING CORRECTION BACKWARD: Lean backward against my hands beyond your backward limits.  When I let go, do whatever is necessary, including a  step to avoid a fall. (2) recovers independently  with a single, large step, to avoid a fall   (1) more than one step used to recover equilibrium   (0) No step OR would fall if not caught Score:  0  6. COMPENSATORY STEPPING LATERAL: Lean into my hand beyond your sideways limit.  When I let go, do whatever is necessary, including taking a step, to avoid a fall. Left           Right   (2) recovers independently with 1 step (crossover or lateral OK)   (1) several steps to recover equilibrium   (0) falls or cannot step Score:     0  (use the side with the lowest score)  SENSORY ORIENTATION 7. STANCE FEET TOGETHER EYES OPEN  firm surface Be as stable and still as possible until I say stop   (2) 30s   (1) < 30 s   (0) unable Score:  2  8. STANCE ON FOAM EYES CLOSED feet together, eyes closed, hands on hips. Be as stable and still as you can until I stay stop.  I will start timing when you close your eyes. Time in seconds:      (2) 30s   (1) <30 s   (0) unable Score:  1  9. INCLINE EYES CLOSED: Stand on incline with feet shoulder width apart, arms down at your sides.  I will start timing when you close your eyes   (2) Stands 30 s and aligns with gravity   (1) stands < 30 s OR aligns with surface   (0) unable Score:  1  DYNAMIC GAIT 10. CHANGE IN GAIT SPEED: begin walking at your normal speed, when I tell you fast, walk as fast as you can, when I say slow walk very slowly   (2) significantly changes walking speed without  imbalance   (1) unable to change walking speed or signs of imbalance   (0) unable to achieve significant change in walking speed AND signes of imbalance Score: 2  11. WALK WITH HEAD TURNS HORIZONTAL: begin walking at normal speed, when I say right, turn your head to the right, when I say left turn your head to the left.  Try to keep yourself walking in a straight line   (2) performs head turns with no change in gait speed and good balance   (1) performs head turns with reduction in gait speed   (0)  performs head turns with imbalance Score:  2  12. WALK WITH PIVOT TURNS: begin walking at your normal speed.  When I tell you to turn and stop, turn as quickly as you can, face the opposite direction and stop.  After the turn, your feet should be close together   (2) turns with feet close FAST in 3 steps with good balance   (1) turns with feet close SLOW > 4 steps with good balance   (0) cannot turn with feet close at any speed without imbalance Score:2  13. STEP OVER OBSTACLES:  ( 9 inch height 10 feet away from subject) begin walking at your normal speed.  When you get to the box, step over it, not around it and keep walking   (2) Able to step over box with minimal change of gait speed and good balance   (1) Steps over box but touches box OR displays cautious behavior by slowing gait   (0) Unable to step over box OR steps around box Score:  2  14. TIMED UP AND GO NORMAL AND WITH DUAL TASK: 3 METER WALK: When I say go walk at your your normal speed across the tape, turn around and come back to sit in the chair   Time: Count backwards by 3s starting at    20.  When I say go, stand up from the chair, walk at your normal speed across the tape on the floor, turn around, come back to sit in the chair.  Continue counting backwards the entire time.  Time:   (2) No noticeable change in sitting, standing or walking while backward counting when compared to TUG without dual task   (1) Dual task  affects either counting OR walking (>10%)    (0) stops counting while walking OR stops walking while counting If gait speed slows more than 10% between TUG without and with dual task, the score should be decreased by a point Score: 1  Total score:       17    /28      TREATMENT DATE:   09/04/24 Step taps to steps 1-2-3 steps at stair holding 10# KB B Hurdles with 3 sec knee raises fwd multiple reps - very difficult Running man's focusing on form including arms and prolonged hold on one leg Divers with one finger support x 5 ea Diver forward x 5 B reaching for counter Leg Press (seat 7) 105# 2 x 10 bilateral ; 70# unilateral 2 x 10 Unilateral 10# farmer's carry x 2 laps down long hallways Walking outside on grass to assess ability and form including declines Wall sit on decline board to fatigue    08/29/24 Bike L3 x PT present to discuss status Step ups at stair holding 5# KB x 12 bilateral  Hurdles with 5# KB hold x 3 laps Leg Press (seat 7) 70# 2 x 10 bilateral ; 45# unilateral 2 x 10 Unilateral 10# farmer's carry x 2 laps down long hallways Single leg on therapad + opposite leg swing (forward, lateral, backward) x 8 bilateral  Floor dots various patterns, forward, and lateral - backward most challenging Step taps on BOSU holding 8# DB x 10 bilateral  Single leg cross taps (like braiding) 2x10 B - challenging SBA to CGA   08/23/24 Step ups at stair holding 5# KB x 12 bilateral  Hurdles with 5# KB hold x 3 laps Leg Press (seat 7) 70# 2 x 10 bilateral ; 45# unilateral 2 x 10 Unilateral 10# farmer's carry x 2 laps down long hallways Single leg on therapad + opposite leg swing (forward, lateral, backward) x 8 bilateral  Agility ladder: various patterns, forward, and lateral - backward most challenging Tandem walking by counter (forwards & backwards) x 3 laps Step taps on BOSU holding 8# DB x 10 bilateral   08/21/24 Bike L 3 x 5 min PT present to discuss status, proper  adjustments and posture while on machine for optimum benefit Standing quad stretch x 30 sec B Prone quad stretch 2x30 sec B Seated IASTM with roller to B thighs Retro walking x 6 laps - better with bigger step length SBA Braiding x 6 laps at [facility] Single leg cross taps (like braiding) 2x10 B - challenging SBA to CGA Walking, lateral steps, weight shifts on cobblestone pads with SBA Attempted cone touches 3 sets of 5 each LE SBA better with shoes off (cone on back counter) Agility ladder: various patterns, forward, backward and lateral - backward most challenging   07/27/24 Nustep  x 5 min level 3 PT present to discuss status, proper adjustments and posture while on machine for optimum benefit Wall pull aways  2 x 10 Staggered stance at wall:  retro stepping with weight shift forward and back 2x10 each LE needed minor cga for lob Standing gastroc stretch 10 x 10 sec B using rocker board as slant board kneeling ant tib stretch 2x30 sec - sitting on heels on mat table (Used [place] bars for balance) Rocker board x 2 min with UE support Lunge to BOSU x 10 each LE fwd and lateral  Obstacle course with balance pad, cobblestone pads and hurdles: 5 laps with cga Marching with high knees, squats and heel to toe rocking on balance pad x 20 each Attempted cone touches 3 sets of 10 each LE with cga (cone on back counter)  07/25/24 Wall pull aways with back to the wall and using anterior tib muscles and toe extensors to pull body off the wall  Staggered stance at wall:  retro stepping with weight shift forward and back 2x10 needs intermittent UE support to recover balance Standing gastroc stretch 2x30 sec B kneeling ant tib stretch 2x30 sec - sitting on heels on mat table Seated ankle ROM all planes, circles and alphabet Second set of Staggered stance at wall:  retro stepping with weight shift forward and back 2x10 needs intermittent UE support to recover balance Agility ladder - multiple variations  incorporating lateral, fwd, bwd and diagonal movements    PATIENT EDUCATION:  Education details: Issued HEP Person educated: Patient Education method: Explanation, Demonstration, and Handouts Education comprehension: verbalized understanding and returned demonstration  HOME EXERCISE PROGRAM: Access Code: [facility] URL: [email or web address] Date: 07/25/2024 Prepared by: Mliss  Exercises - Seated Transversus Abdominis Bracing  - 1 x daily - 7 x weekly - 2 sets - 10 reps - Seated Hamstring Stretch  - 1 x daily - 7 x weekly - 2 reps - 20 sec hold - Seated Piriformis Stretch  - 1 x daily - 7 x weekly - 2 reps - 20 sec hold - Single Leg Stance with Support  - 1 x daily - 7 x weekly - 2 reps - 20 sec hold - Shoulder External Rotation and Scapular Retraction with Resistance  - 1 x daily - 7 x weekly - 2 sets - 10 reps - Standing Shoulder Horizontal Abduction with Resistance  - 1 x daily - 7 x weekly - 2 sets - 10 reps - Standing Shoulder Row with Anchored Resistance  - 1 x daily - 7 x weekly - 2 sets - 10 reps - Shoulder extension with resistance - Neutral  - 1 x daily - 7 x weekly - 2 sets - 10 reps - Single Leg Balance with Clock Reach  - 1 x daily - 7 x weekly - 10 reps - Standing 'L' Stretch at Counter  - 1 x daily - 7 x weekly - 2 reps - 20 sec hold - Resisted Sit-to-Stand With Dumbbell at Chest  - 1 x daily - 3 x weekly - 2 sets - 10 reps - Side Step Overs with Cones and Unilateral Counter Support  - 1 x daily - 3 x weekly - 2 sets - 10 reps - Standing 3-Way Leg Reach with Resistance at Ankles and Counter Support  - 1 x daily - 3 x weekly - 2 sets - 10 reps - Side Stepping with Resistance at Ankles  - 1 x daily - 3 x weekly - 2  sets - 10 reps - Forward Monster Walk with Resistance at Ankles and Counter Support  - 1 x daily - 3 x weekly - 2 sets - 10 reps - Squat with Chair Touch  - 1 x daily - 3 x weekly - 2 sets - 10 reps - Kneeling Plantarflexion Stretch  - 1 x daily - 3  x weekly - 1 sets - 3 reps - 30 sec hold - Supine Ankle Dorsiflexion and Plantarflexion AROM  - 1 x daily - 7 x weekly - 2 sets - 10 reps - Supine Ankle Inversion and Eversion AROM  - 1 x daily - 7 x weekly - 2 sets - 10 reps - Supine Ankle Circles  - 1 x daily - 7 x weekly - 2 sets - 10 reps - Seated Ankle Alphabet  - 1 x daily - 7 x weekly - 1 sets - 1 reps  ASSESSMENT:  CLINICAL IMPRESSION: Patient demonstrates improved SLS today to 5 sec on the L and 4 sec on the R after focusing on dynamic SL stance activities. He reports difficulties on uneven ground. He also reports difficulty with descending hills when we were walking outside. He did well on the slight decline of the grass, but had fairly quick fatigue with decline wall sit. May benefit from eccentric quad strengthening on stairs. He continues to demonstrate potential for improvement and would benefit from continued skilled therapy to address impairments.      OBJECTIVE IMPAIRMENTS: decreased balance, decreased strength, increased muscle spasms, impaired flexibility, postural dysfunction, and pain.   ACTIVITY LIMITATIONS: lifting, standing, and stairs  PARTICIPATION LIMITATIONS: driving, community activity, and yard work  PERSONAL FACTORS: Time since onset of injury/illness/exacerbation and 1-2 comorbidities: OA, right hip arthroplasty are also affecting patient's functional outcome.   REHAB POTENTIAL: Good  CLINICAL DECISION MAKING: Stable/uncomplicated  EVALUATION COMPLEXITY: Low   GOALS: Goals reviewed with patient? Yes  SHORT TERM GOALS: Target date: 06/16/2024  Patient will be independent with initial HEP. Baseline: Goal status: Met on 05/31/24  2.  Patient will demonstrate and verbalize knowledge of proper lifting techniques to allow him to lift suitcases for travel. Baseline:  Goal status: Ongoing   LONG TERM GOALS: Target date: 10/11/2024    Patient will be independent with advanced HEP to allow for self  progression after discharge. Baseline:  Goal status: INITIAL  2.  Patient will improve modified Oswestry to not greater than 5% to demonstrate improved functional mobility. Baseline: 18% Goal status: IN PROGRESS 07/05/24  8%   3.  Patient will increase right hip strength to [facility] to allow him to navigate stairs with reciprocal pattern without difficulty/discomfort. Baseline:  Goal status: MET 07/05/24  4.  Patient will increase bilateral single leg stance to greater than 10 seconds to decrease patients fall risk. Baseline: right- 1.53 sec, left- 1.40 sec Goal status: IN PROGRESS 09/04/24  5.  Patient will report ability to ambulate over various unlevel surfaces for desired length (at least greater than 30 minutes) without a loss of balance to allow patient to travel and sight-see. Baseline:  Goal status: ongoing  6 Patient will improve dynamic balance by using an ankle strategy in order to reduce loss of balance in the posterior direction New  7. Improved mini- best score to 23/28 indicating improved dynamic balance and decreased risk of falls  PLAN:  PT FREQUENCY: 2x/week  PT DURATION: 12 weeks  (pt will be traveling for a few weeks in September)  PLANNED INTERVENTIONS: 02835- PT  Re-evaluation, 97750- Physical Performance Testing, 97110-Therapeutic exercises, 97530- Therapeutic activity, [id number]- Neuromuscular re-education, [phone]- Self Care, 02859- Manual therapy, [phone]- Gait training, [phone]- Canalith repositioning, [phone]- Aquatic Therapy, [phone]- Electrical stimulation (unattended), [phone]- Electrical stimulation (manual), [id number]- Ultrasound, [id number]- Traction (mechanical), [id number]- Ionotophoresis 4mg /ml Dexamethasone , 79439 (1-2 muscles), 20561 (3+ muscles)- Dry Needling, Patient/Family education, Balance training, Stair training, Taping, Joint mobilization, Joint manipulation, Spinal manipulation, Spinal mobilization, Cryotherapy, and Moist heat.  PLAN FOR NEXT SESSION: continue with prolonged  and dynamic SLS, agility ladder with bwd activities, and crossing midline activities, eccentric quad strengthening  Mliss Cummins, PT  09/04/24 1:36 PM [facility] [address], [address] [place], [place] [address] Phone # [phone] Fax [phone]

## 2024-09-04 ENCOUNTER — Ambulatory Visit: Attending: Sports Medicine | Admitting: Physical Therapy

## 2024-09-04 ENCOUNTER — Encounter: Payer: Self-pay | Admitting: Physical Therapy

## 2024-09-04 DIAGNOSIS — R252 Cramp and spasm: Secondary | ICD-10-CM | POA: Insufficient documentation

## 2024-09-04 DIAGNOSIS — M5459 Other low back pain: Secondary | ICD-10-CM | POA: Insufficient documentation

## 2024-09-04 DIAGNOSIS — M6281 Muscle weakness (generalized): Secondary | ICD-10-CM | POA: Insufficient documentation

## 2024-09-04 DIAGNOSIS — R262 Difficulty in walking, not elsewhere classified: Secondary | ICD-10-CM | POA: Insufficient documentation

## 2024-09-04 DIAGNOSIS — R2689 Other abnormalities of gait and mobility: Secondary | ICD-10-CM | POA: Diagnosis not present

## 2024-09-04 DIAGNOSIS — R293 Abnormal posture: Secondary | ICD-10-CM | POA: Insufficient documentation

## 2024-09-06 ENCOUNTER — Ambulatory Visit: Admitting: Physical Therapy

## 2024-09-06 ENCOUNTER — Encounter: Payer: Self-pay | Admitting: Physical Therapy

## 2024-09-06 DIAGNOSIS — R262 Difficulty in walking, not elsewhere classified: Secondary | ICD-10-CM | POA: Diagnosis not present

## 2024-09-06 DIAGNOSIS — R2689 Other abnormalities of gait and mobility: Secondary | ICD-10-CM

## 2024-09-06 DIAGNOSIS — M5459 Other low back pain: Secondary | ICD-10-CM

## 2024-09-06 DIAGNOSIS — M6281 Muscle weakness (generalized): Secondary | ICD-10-CM | POA: Diagnosis not present

## 2024-09-06 DIAGNOSIS — R252 Cramp and spasm: Secondary | ICD-10-CM | POA: Diagnosis not present

## 2024-09-06 DIAGNOSIS — R293 Abnormal posture: Secondary | ICD-10-CM | POA: Diagnosis not present

## 2024-09-06 NOTE — Therapy (Signed)
 OUTPATIENT PHYSICAL THERAPY TREATMENT NOTE   Patient Name: [name] MRN: [id number] DOB:05/29/1946, 78 y.o., male Today's Date: 09/06/2024  END OF SESSION:  PT End of Session - 09/06/24 1448     Visit Number 16    Date for Recertification  10/11/24    Authorization Type [employer] MCR - 18 visits approved    Authorization Time Period 07/20/24 to 10/11/24    Authorization - Visit Number 16    Authorization - Number of Visits 18    Progress Note Due on Visit 19    PT Start Time 1449    PT Stop Time 1533    PT Time Calculation (min) 44 min    Activity Tolerance Patient tolerated treatment well    Behavior During Therapy [facility] [place] for tasks assessed/performed                     Past Medical History:  Diagnosis Date   Abscess    right posterior neck   Anticoagulation adequate 06/07/2019   Overview:  SSS, paroxAFib/Flutter, CHADSvasc= 3 age, h/o cva; chronic OAC Xarelto    Atrial fibrillation and flutter (HCC) 01/01/2015   Noted 2016. Xarelto     BPH associated with nocturia 08/14/2014   Finasteride , flomax .  0-3x nocturia. PSA to 14-->eval urology [facility]. PSA trended back down to about 2.6 per records in 2015 then placed on finasteride - had significant voiding issues when spiked to 14- improved on meds    Bradycardia    CAD (coronary artery disease) 05/26/2016   Coronary CTA_ 50-75% D1 off CT report    Cardiac pacemaker in situ 12/28/2009   SA node dysfunction as cause    Chest pressure 12/01/2015   Overview:  atypical CP, [facility] nuclear perfusion imaging w/ no inarct, no ischemia, EF-40% NICM   Chronic systolic heart failure (HCC) 12/01/2015   Overview:  [facility] nuclear perfusion imaging study, no inarct, no ischemia, EF-40% NICM   CVA (cerebral vascular accident) (HCC)    Double vision 07/04/2018   Elevated LFTs    Erectile dysfunction 01/30/2015   cialis  prn    External hemorrhoids    Fitting or adjustment of cardiac pacemaker 06/07/2019   Overview:  Biotronik [place] DR [id number], RA  and RV leads [id number]:  [id number] and [id number]   Former smoker 01/30/2015   Former smoker. 20 pack years quit 92. Patient declined AAA screen.     GERD (gastroesophageal reflux disease)    Gout 11/11/2009   Qualifier: Diagnosis of  By: Gardenia Pao     History of nuclear stress test 06/07/2019   Overview:  [facility], no inarct, no ischemia, EF-40% NICM   HTN (hypertension)    Hyperglycemia 06/10/2017   110s CBG 2018   Hyperlipidemia 07/12/2007   Crestor  20mg  daily with some myalgias; fish oil. Trig 200-500 despite this. LDL ok.        Insomnia 01/30/2015   ambien  5mg  prn. May refill.     Ischemic optic neuropathy    Left chest pressure 03/19/2015   Other and unspecified hyperlipidemia    Sinoatrial node dysfunction (HCC)    Spinal stenosis    Trigger finger, right middle finger 03/01/2018   Past Surgical History:  Procedure Laterality Date   ABSCESS DRAINAGE     righ tposterior neck   CARDIAC CATHETERIZATION  1996   CHOLECYSTECTOMY  02/07/2003   COLONOSCOPY  06/23/2004   EYE SURGERY     left eye   left ingunial hernia  06/08/2011   PACEMAKER INSERTION  PPM GENERATOR CHANGEOUT N/A 07/12/2024   Procedure: PPM GENERATOR CHANGEOUT;  Surgeon: Waddell Danelle ORN, MD;  Location: [facility] INVASIVE CV LAB;  Service: Cardiovascular;  Laterality: N/A;   right hip replacement     2012 Dr. Hiram   Patient Active Problem List   Diagnosis Date Noted   Aortic atherosclerosis 08/01/2020   Fatty liver 08/01/2020   Paroxysmal atrial fibrillation (HCC) 07/27/2019   Anticoagulation adequate 06/07/2019   Convergence insufficiency 06/07/2019   Fitting or adjustment of cardiac pacemaker 06/07/2019   Pacemaker-dependent due to native cardiac rhythm insufficient to support life 06/07/2019   Palpitations 06/07/2019   History of nuclear stress test 06/07/2019   S/P cardiac cath 06/07/2019   Non-occlusive coronary artery disease 06/07/2019   Double vision 07/04/2018   Trigger finger, right middle finger  03/01/2018   Hyperglycemia 06/10/2017   Solitary pulmonary nodule 05/26/2016   CAD (coronary artery disease) 05/26/2016   Nonischemic cardiomyopathy (HCC) 04/23/2016   Chronic systolic heart failure (HCC) 12/01/2015   Chest pressure 12/01/2015   Left chest pressure 03/19/2015   Insomnia 01/30/2015   Erectile dysfunction 01/30/2015   Cerebral infarction (HCC) 01/30/2015   Former smoker 01/30/2015   Atrial fibrillation and flutter (HCC) 01/01/2015   Allergic rhinitis 10/11/2014   BPH associated with nocturia 08/14/2014   Ventricular tachycardia, non-sustained (HCC) 01/25/2013   Sinoatrial node dysfunction (HCC)    SPINAL STENOSIS, LUMBAR 08/22/2010   GERD 03/17/2010   Cardiac pacemaker in situ 12/28/2009   Gout 11/11/2009   Hyperlipidemia 07/12/2007   NEUROPATHY, ISCHEMIC OPTIC 06/02/2007   Essential hypertension 06/02/2007    PCP: [name]., MD  REFERRING PROVIDER: [name], DO  REFERRING DIAG: M54.42,M54.41,G89.29 (ICD-10-CM) - Chronic bilateral low back pain with bilateral sciatica M51.362 (ICD-10-CM) - Degeneration of intervertebral disc of lumbar region with discogenic back pain and lower extremity pain  Rationale for Evaluation and Treatment: Rehabilitation  THERAPY DIAG:  Other low back pain  Other abnormalities of gait and mobility  Difficulty in walking, not elsewhere classified  Muscle weakness (generalized)  Balance disorder  ONSET DATE: February 2024 first visit to Dr Leonce  SUBJECTIVE:                                                                                                                                                                                           SUBJECTIVE STATEMENT: [facility]   PERTINENT HISTORY:  [name] (next upcoming trip 7/14 for a [place] cruise) OA, trigger fingers, right hip arthroplasty, pacemaker CVA 30 years ago affecting L side  PAIN:  07/27/24 Are  you  having pain? Yes: NPRS scale: 2/10 Pain location: low back Pain description: discomfort Aggravating factors: worst first thing in the morning or after lying on his couch, swimming Relieving factors: medication, movement  PRECAUTIONS: ICD/Pacemaker  RED FLAGS: None   WEIGHT BEARING RESTRICTIONS: No  FALLS:  Has patient fallen in last 6 months? Yes. Number of falls 1 fall.  Tripped over a chair while traveling at [place]  LIVING ENVIRONMENT: Lives with: lives with their spouse Lives in: House/apartment Stairs: one story Has following equipment at home: None  OCCUPATION: Retired  PLOF: Independent and Leisure: traveling (many upcoming trips planned), swimming  PATIENT GOALS: To feel better and not have to be reliant on pain medication as much and improve balance and flexibility.  NEXT MD VISIT: Follow up with Dr Leonce as needed  OBJECTIVE:  Note: Objective measures were completed at Evaluation unless otherwise noted.  DIAGNOSTIC FINDINGS:  Lumbar CT Scan on 02/04/2023: IMPRESSION: Multilevel degenerative changes which are most severe at the L3-4 level with significant spinal stenosis. No acute traumatic abnormalities identified.  PATIENT SURVEYS:  Modified Oswestry:  MODIFIED OSWESTRY DISABILITY SCALE   Date: 05/26/2024 Score  Pain intensity 2 =  Pain medication provides me with complete relief from pain.  2. Personal care (washing, dressing, etc.) 0 =  I can take care of myself normally without causing increased pain.  3. Lifting 2 = Pain prevents me from lifting heavy weights off the floor, activities (eg. sports, dancing). but I can manage if the weights are conveniently positioned (3) Pain prevents me from going out very often. (eg, on a table).  4. Walking 0 = Pain does not prevent me from walking any distance  5. Sitting 0 =  I can sit in any chair as long as I like.  6. Standing 1 =  I can stand as long as I want but, it increases my pain.  7. Sleeping 1 = I can  sleep well only by using pain medication.  8. Social Life 0 = My social life is normal and does not increase my pain.  9. Traveling 2 =  My pain restricts my travel over 2 hours.  10. Employment/ Homemaking 1 = My normal homemaking/job activities increase my pain, but I can still perform all that is required of me  Total 9 / 50 = 18.0 %   Interpretation of scores: Score Category Description  0-20% Minimal Disability The patient can cope with most living activities. Usually no treatment is indicated apart from advice on lifting, sitting and exercise  21-40% Moderate Disability The patient experiences more pain and difficulty with sitting, lifting and standing. Travel and social life are more difficult and they may be disabled from work. Personal care, sexual activity and sleeping are not grossly affected, and the patient can usually be managed by conservative means  41-60% Severe Disability Pain remains the main problem in this group, but activities of daily living are affected. These patients require a detailed investigation  61-80% Crippled Back pain impinges on all aspects of the patient's life. Positive intervention is required  81-100% Bed-bound  These patients are either bed-bound or exaggerating their symptoms  Bluford FORBES Zoe DELENA Karon DELENA, et al. Surgery versus conservative management of stable thoracolumbar fracture: the PRESTO feasibility RCT. [place] ([place]): [employer]; 2021 Nov. [facility] Technology Assessment, No. 25.62.) Appendix 3, Oswestry Disability Index category descriptors. Available from: FindJewel[employer].cz  Minimally Clinically Important Difference (MCID) = 12.8%   8/6/2 ODI 4/50 = 8%  COGNITION: Overall cognitive status: Within functional limits for tasks assessed     SENSATION: Patient states that he has a neuropathy problem in his feet, but it has improved since starting Gabapentin   MUSCLE LENGTH: Hamstrings: increased  tightness bilaterally Piriformis:  tightness bilat  POSTURE: rounded shoulders and forward head   LUMBAR ROM:   Eval:  Decreased by approx 50%  LOWER EXTREMITY ROM:     WFL  LOWER EXTREMITY MMT:    Eval:   LE grossly WNL, but grossly 4+ to 5-/5 in right hip Core strength grossly 4-/5 throughout  LUMBAR SPECIAL TESTS:  Slump test: Negative  FUNCTIONAL TESTS:  Eval: 5 times sit to stand: 11.87 sec Single Leg Stance:  right- 1.53 sec, left- 1.40 sec  09/04/24  SLS R 4 sec; L 5 sec  GAIT: Distance walked: >500 ft Assistive device utilized: None Level of assistance: Complete Independence Comments: States that he has more difficulty with balance on unlevel surfaces (like grass) than firm surfaces.     MINI-BESTest: Balance Evaluation Systems Test ANTICIPATORY: SIT TO STAND: (2) normal without use of hands and stabilizes independently     (1) Moderate comes to stand WITH use of hands on 1st attempt    (0) Severe: unable to stand up from chair without assistance OR needs several attempts with use of   hands Score:  2  2. RISE TO TOES: feet shoulder width apart. Hands on hips.  Rise as high as you can onto your toes. Try to hold this pose for 3 sec                          (2) stable for 3s with maximum height    (1) heels up, but not full range (smaller than when holding hands) OR noticeable instability for 3 sec                           (0) < or equat to 3 s  Score:  1  3. STAND ON ONE LEG: look straight ahead. Hands on hips. Lift your leg off the ground.     Left:  trial 1 __3__ trial 2__6_                                    Right: Trial 1:___1   Trial 2:____3___   (2) 20 s       (2)  20 s                (1) < 20      (1) < 20 s   (0) Unable      (0) unable Score (use the worst side):  1  REACTIVE POSTURAL CONTROL 4. COMPENSATORY STEPPING CORRECTION FORWARD stand with feet shoulder width apart, arms at your sides. Lean forward against my hands beyond your forward  limits.  When I let go, do whatever is necessary, including taking a step, to avoid a fall   (2) recovers independently  with a single, large step, to avoid a fall   (1) more than one step used to recover equilibrium   (0) No step OR would fall if not caught Score:  0  5. COMPENSATORY STEPPING CORRECTION BACKWARD: Lean backward against my hands beyond your backward limits.  When I let go, do whatever is necessary, including a step to  avoid a fall. (2) recovers independently  with a single, large step, to avoid a fall   (1) more than one step used to recover equilibrium   (0) No step OR would fall if not caught Score:  0  6. COMPENSATORY STEPPING LATERAL: Lean into my hand beyond your sideways limit.  When I let go, do whatever is necessary, including taking a step, to avoid a fall. Left           Right   (2) recovers independently with 1 step (crossover or lateral OK)   (1) several steps to recover equilibrium   (0) falls or cannot step Score:     0  (use the side with the lowest score)  SENSORY ORIENTATION 7. STANCE FEET TOGETHER EYES OPEN  firm surface Be as stable and still as possible until I say stop   (2) 30s   (1) < 30 s   (0) unable Score:  2  8. STANCE ON FOAM EYES CLOSED feet together, eyes closed, hands on hips. Be as stable and still as you can until I stay stop.  I will start timing when you close your eyes. Time in seconds:      (2) 30s   (1) <30 s   (0) unable Score:  1  9. INCLINE EYES CLOSED: Stand on incline with feet shoulder width apart, arms down at your sides.  I will start timing when you close your eyes   (2) Stands 30 s and aligns with gravity   (1) stands < 30 s OR aligns with surface   (0) unable Score:  1  DYNAMIC GAIT 10. CHANGE IN GAIT SPEED: begin walking at your normal speed, when I tell you fast, walk as fast as you can, when I say slow walk very slowly   (2) significantly changes walking speed without imbalance   (1) unable to change  walking speed or signs of imbalance   (0) unable to achieve significant change in walking speed AND signes of imbalance Score: 2  11. WALK WITH HEAD TURNS HORIZONTAL: begin walking at normal speed, when I say right, turn your head to the right, when I say left turn your head to the left.  Try to keep yourself walking in a straight line   (2) performs head turns with no change in gait speed and good balance   (1) performs head turns with reduction in gait speed   (0)  performs head turns with imbalance Score:  2  12. WALK WITH PIVOT TURNS: begin walking at your normal speed.  When I tell you to turn and stop, turn as quickly as you can, face the opposite direction and stop.  After the turn, your feet should be close together   (2) turns with feet close FAST in 3 steps with good balance   (1) turns with feet close SLOW > 4 steps with good balance   (0) cannot turn with feet close at any speed without imbalance Score:2  13. STEP OVER OBSTACLES:  ( 9 inch height 10 feet away from subject) begin walking at your normal speed.  When you get to the box, step over it, not around it and keep walking   (2) Able to step over box with minimal change of gait speed and good balance   (1) Steps over box but touches box OR displays cautious behavior by slowing gait   (0) Unable to step over box OR steps around box Score:  2  14. TIMED UP AND GO NORMAL AND WITH DUAL TASK: 3 METER WALK: When I say go walk at your your normal speed across the tape, turn around and come back to sit in the chair   Time: Count backwards by 3s starting at    20.  When I say go, stand up from the chair, walk at your normal speed across the tape on the floor, turn around, come back to sit in the chair.  Continue counting backwards the entire time.  Time:   (2) No noticeable change in sitting, standing or walking while backward counting when compared to TUG without dual task   (1) Dual task affects either counting OR walking  (>10%)    (0) stops counting while walking OR stops walking while counting If gait speed slows more than 10% between TUG without and with dual task, the score should be decreased by a point Score: 1  Total score:       17    /28      TREATMENT DATE:   09/06/24(SBA/CGA at all times) Step taps to steps 1-2-3 steps at stair holding 10# KB B Standing bird dog as warm up for running mans Running man's focusing on form including arms and prolonged hold on one leg Hurdles with 3 sec knee raises fwd multiple reps - very difficult Divers with one finger support x 5 ea Single leg cone taps on mat table with kickstand B; then without kickstand Diver forward x 5 B reaching for mat Leg Press (seat 7) 105# 2 x 10 bilateral ; 70# unilateral 2 x 10 Cross body taps x 10 B Seated piriformis stretch x 30 sec  Reverse taps x 10 ea Agility ladder - backwards biased   09/04/24 Step taps to steps 1-2-3 steps at stair holding 10# KB B Hurdles with 3 sec knee raises fwd multiple reps - very difficult Running man's focusing on form including arms and prolonged hold on one leg Divers with one finger support x 5 ea Diver forward x 5 B reaching for counter Leg Press (seat 7) 105# 2 x 10 bilateral ; 70# unilateral 2 x 10 Unilateral 20# farmer's carry x 2 laps down long hallways Walking outside on grass to assess ability and form including declines Wall sit on decline board to fatigue    08/29/24 Bike L3 x PT present to discuss status Step ups at stair holding 5# KB x 12 bilateral  Hurdles with 5# KB hold x 3 laps Leg Press (seat 7) 70# 2 x 10 bilateral ; 45# unilateral 2 x 10 Unilateral 10# farmer's carry x 2 laps down long hallways Single leg on therapad + opposite leg swing (forward, lateral, backward) x 8 bilateral  Floor dots various patterns, forward, and lateral - backward most challenging Step taps on BOSU holding 8# DB x 10 bilateral  Single leg cross taps (like braiding) 2x10 B -  challenging SBA to CGA   08/23/24 Step ups at stair holding 5# KB x 12 bilateral  Hurdles with 5# KB hold x 3 laps Leg Press (seat 7) 70# 2 x 10 bilateral ; 45# unilateral 2 x 10 Unilateral 10# farmer's carry x 2 laps down long hallways Single leg on therapad + opposite leg swing (forward, lateral, backward) x 8 bilateral  Agility ladder: various patterns, forward, and lateral - backward most challenging Tandem walking by counter (forwards & backwards) x 3 laps Step taps on BOSU holding 8# DB x 10 bilateral  08/21/24 Bike L 3 x 5 min PT present to discuss status, proper adjustments and posture while on machine for optimum benefit Standing quad stretch x 30 sec B Prone quad stretch 2x30 sec B Seated IASTM with roller to B thighs Retro walking x 6 laps - better with bigger step length SBA Braiding x 6 laps at [facility] Single leg cross taps (like braiding) 2x10 B - challenging SBA to CGA Walking, lateral steps, weight shifts on cobblestone pads with SBA Attempted cone touches 3 sets of 5 each LE SBA better with shoes off (cone on back counter) Agility ladder: various patterns, forward, backward and lateral - backward most challenging  PATIENT EDUCATION:  Education details: Issued HEP Person educated: Patient Education method: Explanation, Demonstration, and Handouts Education comprehension: verbalized understanding and returned demonstration  HOME EXERCISE PROGRAM: Access Code: [facility] URL: [email or web address] Date: 07/25/2024 Prepared by: Mliss  Exercises - Seated Transversus Abdominis Bracing  - 1 x daily - 7 x weekly - 2 sets - 10 reps - Seated Hamstring Stretch  - 1 x daily - 7 x weekly - 2 reps - 20 sec hold - Seated Piriformis Stretch  - 1 x daily - 7 x weekly - 2 reps - 20 sec hold - Single Leg Stance with Support  - 1 x daily - 7 x weekly - 2 reps - 20 sec hold - Shoulder External Rotation and Scapular Retraction with Resistance  - 1 x daily - 7 x weekly -  2 sets - 10 reps - Standing Shoulder Horizontal Abduction with Resistance  - 1 x daily - 7 x weekly - 2 sets - 10 reps - Standing Shoulder Row with Anchored Resistance  - 1 x daily - 7 x weekly - 2 sets - 10 reps - Shoulder extension with resistance - Neutral  - 1 x daily - 7 x weekly - 2 sets - 10 reps - Single Leg Balance with Clock Reach  - 1 x daily - 7 x weekly - 10 reps - Standing 'L' Stretch at Counter  - 1 x daily - 7 x weekly - 2 reps - 20 sec hold - Resisted Sit-to-Stand With Dumbbell at Chest  - 1 x daily - 3 x weekly - 2 sets - 10 reps - Side Step Overs with Cones and Unilateral Counter Support  - 1 x daily - 3 x weekly - 2 sets - 10 reps - Standing 3-Way Leg Reach with Resistance at Ankles and Counter Support  - 1 x daily - 3 x weekly - 2 sets - 10 reps - Side Stepping with Resistance at Ankles  - 1 x daily - 3 x weekly - 2 sets - 10 reps - Forward Monster Walk with Resistance at Ankles and Counter Support  - 1 x daily - 3 x weekly - 2 sets - 10 reps - Squat with Chair Touch  - 1 x daily - 3 x weekly - 2 sets - 10 reps - Kneeling Plantarflexion Stretch  - 1 x daily - 3 x weekly - 1 sets - 3 reps - 30 sec hold - Supine Ankle Dorsiflexion and Plantarflexion AROM  - 1 x daily - 7 x weekly - 2 sets - 10 reps - Supine Ankle Inversion and Eversion AROM  - 1 x daily - 7 x weekly - 2 sets - 10 reps - Supine Ankle Circles  - 1 x daily - 7 x weekly - 2 sets - 10 reps - Seated Ankle Alphabet  -  1 x daily - 7 x weekly - 1 sets - 1 reps  ASSESSMENT:  CLINICAL IMPRESSION: Patient somewhat fatigued upon arrival due to coming from [facility] We continued to focus on prolonged SLS activities as well as backward stepping and cross-midline activities. Patient is very challenged requiring SBA to CGA at all times for safety. He is able to catch himself when he loses his balance, but often fights to maintain his balance too long before using stepping strategy. He continues to demonstrate potential for  improvement and would benefit from continued skilled therapy to address impairments.       OBJECTIVE IMPAIRMENTS: decreased balance, decreased strength, increased muscle spasms, impaired flexibility, postural dysfunction, and pain.   ACTIVITY LIMITATIONS: lifting, standing, and stairs  PARTICIPATION LIMITATIONS: driving, community activity, and yard work  PERSONAL FACTORS: Time since onset of injury/illness/exacerbation and 1-2 comorbidities: OA, right hip arthroplasty are also affecting patient's functional outcome.   REHAB POTENTIAL: Good  CLINICAL DECISION MAKING: Stable/uncomplicated  EVALUATION COMPLEXITY: Low   GOALS: Goals reviewed with patient? Yes  SHORT TERM GOALS: Target date: 06/16/2024  Patient will be independent with initial HEP. Baseline: Goal status: Met on 05/31/24  2.  Patient will demonstrate and verbalize knowledge of proper lifting techniques to allow him to lift suitcases for travel. Baseline:  Goal status: Ongoing   LONG TERM GOALS: Target date: 10/11/2024    Patient will be independent with advanced HEP to allow for self progression after discharge. Baseline:  Goal status: INITIAL  2.  Patient will improve modified Oswestry to not greater than 5% to demonstrate improved functional mobility. Baseline: 18% Goal status: IN PROGRESS 07/05/24  8%   3.  Patient will increase right hip strength to [facility] to allow him to navigate stairs with reciprocal pattern without difficulty/discomfort. Baseline:  Goal status: MET 07/05/24  4.  Patient will increase bilateral single leg stance to greater than 10 seconds to decrease patients fall risk. Baseline: right- 1.53 sec, left- 1.40 sec Goal status: IN PROGRESS 09/04/24  5.  Patient will report ability to ambulate over various unlevel surfaces for desired length (at least greater than 30 minutes) without a loss of balance to allow patient to travel and sight-see. Baseline:  Goal status: ongoing  6 Patient will  improve dynamic balance by using an ankle strategy in order to reduce loss of balance in the posterior direction New  7. Improved mini- best score to 23/28 indicating improved dynamic balance and decreased risk of falls  PLAN:  PT FREQUENCY: 2x/week  PT DURATION: 12 weeks  (pt will be traveling for a few weeks in September)  PLANNED INTERVENTIONS: 02835- PT Re-evaluation, 97750- Physical Performance Testing, 97110-Therapeutic exercises, 97530- Therapeutic activity, [id number]- Neuromuscular re-education, 97535- Self Care, 02859- Manual therapy, [id number]- Gait training, [phone]- Canalith repositioning, [id number]- Aquatic Therapy, [phone]- Electrical stimulation (unattended), [phone]- Electrical stimulation (manual), [id number]- Ultrasound, [id number]- Traction (mechanical), [id number]- Ionotophoresis 4mg /ml Dexamethasone , [id number] (1-2 muscles), 20561 (3+ muscles)- Dry Needling, Patient/Family education, Balance training, Stair training, Taping, Joint mobilization, Joint manipulation, Spinal manipulation, Spinal mobilization, Cryotherapy, and Moist heat.  PLAN FOR NEXT SESSION: continue with prolonged and dynamic SLS, agility ladder with bwd activities, and crossing midline activities, eccentric quad strengthening  Mliss Cummins, PT  09/06/24 4:32 PM [facility] [address] [place], [place] [address] Phone # [phone] Fax [phone]

## 2024-09-11 ENCOUNTER — Encounter: Admitting: Physical Therapy

## 2024-09-13 ENCOUNTER — Ambulatory Visit: Admitting: Physical Therapy

## 2024-09-13 ENCOUNTER — Encounter: Payer: Self-pay | Admitting: Physical Therapy

## 2024-09-13 DIAGNOSIS — R262 Difficulty in walking, not elsewhere classified: Secondary | ICD-10-CM

## 2024-09-13 DIAGNOSIS — R252 Cramp and spasm: Secondary | ICD-10-CM | POA: Diagnosis not present

## 2024-09-13 DIAGNOSIS — R293 Abnormal posture: Secondary | ICD-10-CM | POA: Diagnosis not present

## 2024-09-13 DIAGNOSIS — R2689 Other abnormalities of gait and mobility: Secondary | ICD-10-CM | POA: Diagnosis not present

## 2024-09-13 DIAGNOSIS — M6281 Muscle weakness (generalized): Secondary | ICD-10-CM | POA: Diagnosis not present

## 2024-09-13 DIAGNOSIS — M5459 Other low back pain: Secondary | ICD-10-CM

## 2024-09-13 NOTE — Therapy (Signed)
 OUTPATIENT PHYSICAL THERAPY TREATMENT NOTE   Patient Name: [name] MRN: [id number] DOB:September 10, 1946, 78 y.o., male Today's Date: 09/13/2024  END OF SESSION:               Past Medical History:  Diagnosis Date   Abscess    right posterior neck   Anticoagulation adequate 06/07/2019   Overview:  SSS, paroxAFib/Flutter, CHADSvasc= 3 age, h/o cva; chronic OAC Xarelto    Atrial fibrillation and flutter (HCC) 01/01/2015   Noted 2016. Xarelto     BPH associated with nocturia 08/14/2014   Finasteride , flomax .  0-3x nocturia. PSA to 14-->eval urology [facility] PSA trended back down to about 2.6 per records in 2015 then placed on finasteride - had significant voiding issues when spiked to 14- improved on meds    Bradycardia    CAD (coronary artery disease) 05/26/2016   Coronary CTA_ 50-75% D1 off CT report    Cardiac pacemaker in situ 12/28/2009   SA node dysfunction as cause    Chest pressure 12/01/2015   Overview:  atypical CP, [facility] nuclear perfusion imaging w/ no inarct, no ischemia, EF-40% NICM   Chronic systolic heart failure (HCC) 12/01/2015   Overview:  [facility] nuclear perfusion imaging study, no inarct, no ischemia, EF-40% NICM   CVA (cerebral vascular accident) (HCC)    Double vision 07/04/2018   Elevated LFTs    Erectile dysfunction 01/30/2015   cialis  prn    External hemorrhoids    Fitting or adjustment of cardiac pacemaker 06/07/2019   Overview:  Biotronik Boynton DR [id number], RA and RV leads [id number]:  [id number] and [id number]   Former smoker 01/30/2015   Former smoker. 20 pack years quit 92. Patient declined AAA screen.     GERD (gastroesophageal reflux disease)    Gout 11/11/2009   Qualifier: Diagnosis of  By: Gardenia Pao     History of nuclear stress test 06/07/2019   Overview:  [facility], no inarct, no ischemia, EF-40% NICM   HTN (hypertension)    Hyperglycemia 06/10/2017   110s CBG 2018   Hyperlipidemia 07/12/2007   Crestor  20mg  daily with some myalgias; fish oil. Trig 200-500  despite this. LDL ok.        Insomnia 01/30/2015   ambien  5mg  prn. May refill.     Ischemic optic neuropathy    Left chest pressure 03/19/2015   Other and unspecified hyperlipidemia    Sinoatrial node dysfunction (HCC)    Spinal stenosis    Trigger finger, right middle finger 03/01/2018   Past Surgical History:  Procedure Laterality Date   ABSCESS DRAINAGE     righ tposterior neck   CARDIAC CATHETERIZATION  1996   CHOLECYSTECTOMY  02/07/2003   COLONOSCOPY  06/23/2004   EYE SURGERY     left eye   left ingunial hernia  06/08/2011   PACEMAKER INSERTION     PPM GENERATOR CHANGEOUT N/A 07/12/2024   Procedure: PPM GENERATOR CHANGEOUT;  Surgeon: Nathan Danelle ORN, MD;  Location: [facility];  Service: Cardiovascular;  Laterality: N/A;   right hip replacement     2012 Dr. Hiram   Patient Active Problem List   Diagnosis Date Noted   Aortic atherosclerosis 08/01/2020   Fatty liver 08/01/2020   Paroxysmal atrial fibrillation (HCC) 07/27/2019   Anticoagulation adequate 06/07/2019   Convergence insufficiency 06/07/2019   Fitting or adjustment of cardiac pacemaker 06/07/2019   Pacemaker-dependent due to native cardiac rhythm insufficient to support life 06/07/2019   Palpitations 06/07/2019   History of nuclear stress test 06/07/2019  S/P cardiac cath 06/07/2019   Non-occlusive coronary artery disease 06/07/2019   Double vision 07/04/2018   Trigger finger, right middle finger 03/01/2018   Hyperglycemia 06/10/2017   Solitary pulmonary nodule 05/26/2016   CAD (coronary artery disease) 05/26/2016   Nonischemic cardiomyopathy (HCC) 04/23/2016   Chronic systolic heart failure (HCC) 12/01/2015   Chest pressure 12/01/2015   Left chest pressure 03/19/2015   Insomnia 01/30/2015   Erectile dysfunction 01/30/2015   Cerebral infarction (HCC) 01/30/2015   Former smoker 01/30/2015   Atrial fibrillation and flutter (HCC) 01/01/2015   Allergic rhinitis 10/11/2014   BPH associated with  nocturia 08/14/2014   Ventricular tachycardia, non-sustained (HCC) 01/25/2013   Sinoatrial node dysfunction (HCC)    SPINAL STENOSIS, LUMBAR 08/22/2010   GERD 03/17/2010   Cardiac pacemaker in situ 12/28/2009   Gout 11/11/2009   Hyperlipidemia 07/12/2007   NEUROPATHY, ISCHEMIC OPTIC 06/02/2007   Essential hypertension 06/02/2007    PCP: Nathan Garnette KIDD., MD  REFERRING PROVIDER: [name], DO  REFERRING DIAG: M54.42,M54.41,G89.29 (ICD-10-CM) - Chronic bilateral low back pain with bilateral sciatica M51.362 (ICD-10-CM) - Degeneration of intervertebral disc of lumbar region with discogenic back pain and lower extremity pain  Rationale for Evaluation and Treatment: Rehabilitation  THERAPY DIAG:  No diagnosis found.  ONSET DATE: February 2024 first visit to Dr Nathan  SUBJECTIVE:                                                                                                                                                                                           SUBJECTIVE STATEMENT: Now my back just gets sore.    PERTINENT HISTORY:  [name] frequently (next upcoming trip 7/14 for a [place] cruise) OA, trigger fingers, right hip arthroplasty, pacemaker CVA 30 years ago affecting L side  PAIN:  07/27/24 Are you having pain? Yes: NPRS scale: 2/10 Pain location: low back Pain description: discomfort Aggravating factors: worst first thing in the morning or after lying on his couch, swimming Relieving factors: medication, movement  PRECAUTIONS: ICD/Pacemaker  RED FLAGS: None   WEIGHT BEARING RESTRICTIONS: No  FALLS:  Has patient fallen in last 6 months? Yes. Number of falls 1 fall.  Tripped over a chair while traveling at [place]  LIVING ENVIRONMENT: Lives with: lives with their spouse Lives in: House/apartment Stairs: one story Has following equipment at home: None  OCCUPATION: Retired  PLOF: Independent and Leisure: traveling (many upcoming trips  planned), swimming  PATIENT GOALS: To feel better and not have to be reliant on pain medication as much and improve balance and flexibility.  NEXT MD VISIT: Follow up with Dr Nathan as needed  OBJECTIVE:  Note: Objective measures were completed at Evaluation unless otherwise noted.  DIAGNOSTIC FINDINGS:  Lumbar CT Scan on 02/04/2023: IMPRESSION: Multilevel degenerative changes which are most severe at the L3-4 level with significant spinal stenosis. No acute traumatic abnormalities identified.  PATIENT SURVEYS:  Modified Oswestry:  MODIFIED OSWESTRY DISABILITY SCALE   Date: 05/26/2024 Score  Pain intensity 2 =  Pain medication provides me with complete relief from pain.  2. Personal care (washing, dressing, etc.) 0 =  I can take care of myself normally without causing increased pain.  3. Lifting 2 = Pain prevents me from lifting heavy weights off the floor, activities (eg. sports, dancing). but I can manage if the weights are conveniently positioned (3) Pain prevents me from going out very often. (eg, on a table).  4. Walking 0 = Pain does not prevent me from walking any distance  5. Sitting 0 =  I can sit in any chair as long as I like.  6. Standing 1 =  I can stand as long as I want but, it increases my pain.  7. Sleeping 1 = I can sleep well only by using pain medication.  8. Social Life 0 = My social life is normal and does not increase my pain.  9. Traveling 2 =  My pain restricts my travel over 2 hours.  10. Employment/ Homemaking 1 = My normal homemaking/job activities increase my pain, but I can still perform all that is required of me  Total 9 / 50 = 18.0 %   Interpretation of scores: Score Category Description  0-20% Minimal Disability The patient can cope with most living activities. Usually no treatment is indicated apart from advice on lifting, sitting and exercise  21-40% Moderate Disability The patient experiences more pain and difficulty with sitting, lifting and  standing. Travel and social life are more difficult and they may be disabled from work. Personal care, sexual activity and sleeping are not grossly affected, and the patient can usually be managed by conservative means  41-60% Severe Disability Pain remains the main problem in this group, but activities of daily living are affected. These patients require a detailed investigation  61-80% Crippled Back pain impinges on all aspects of the patient's life. Positive intervention is required  81-100% Bed-bound  These patients are either bed-bound or exaggerating their symptoms  Bluford FORBES Zoe DELENA Karon DELENA, et al. Surgery versus conservative management of stable thoracolumbar fracture: the PRESTO feasibility RCT. [place] ([place]): [employer]; 2021 Nov. [facility] Technology Assessment, No. 25.62.) Appendix 3, Oswestry Disability Index category descriptors. Available from: FindJewel[employer].cz  Minimally Clinically Important Difference (MCID) = 12.8%   8/6/2 ODI 4/50 = 8%  COGNITION: Overall cognitive status: Within functional limits for tasks assessed     SENSATION: Patient states that he has a neuropathy problem in his feet, but it has improved since starting Gabapentin   MUSCLE LENGTH: Hamstrings: increased tightness bilaterally Piriformis:  tightness bilat  POSTURE: rounded shoulders and forward head   LUMBAR ROM:   Eval:  Decreased by approx 50%  LOWER EXTREMITY ROM:     WFL  LOWER EXTREMITY MMT:    Eval:   LE grossly WNL, but grossly 4+ to 5-/5 in right hip Core strength grossly 4-/5 throughout  LUMBAR SPECIAL TESTS:  Slump test: Negative  FUNCTIONAL TESTS:  Eval: 5 times sit to stand: 11.87 sec Single Leg Stance:  right- 1.53 sec, left- 1.40 sec  09/04/24  SLS R 4 sec; L 5 sec 09/13/24 SLS R  4 sec; L 6 sec  GAIT: Distance walked: >500 ft Assistive device utilized: None Level of assistance: Complete Independence Comments: States  that he has more difficulty with balance on unlevel surfaces (like grass) than firm surfaces.     MINI-BESTest: Balance Evaluation Systems Test ANTICIPATORY: SIT TO STAND: (2) normal without use of hands and stabilizes independently     (1) Moderate comes to stand WITH use of hands on 1st attempt    (0) Severe: unable to stand up from chair without assistance OR needs several attempts with use of   hands Score:  2  2. RISE TO TOES: feet shoulder width apart. Hands on hips.  Rise as high as you can onto your toes. Try to hold this pose for 3 sec                          (2) stable for 3s with maximum height    (1) heels up, but not full range (smaller than when holding hands) OR noticeable instability for 3 sec                           (0) < or equat to 3 s  Score:  1  3. STAND ON ONE LEG: look straight ahead. Hands on hips. Lift your leg off the ground.     Left:  trial 1 __3__ trial 2__6_                                    Right: Trial 1:___1   Trial 2:____3___   (2) 20 s       (2)  20 s                (1) < 20      (1) < 20 s   (0) Unable      (0) unable Score (use the worst side):  1  REACTIVE POSTURAL CONTROL 4. COMPENSATORY STEPPING CORRECTION FORWARD stand with feet shoulder width apart, arms at your sides. Lean forward against my hands beyond your forward limits.  When I let go, do whatever is necessary, including taking a step, to avoid a fall   (2) recovers independently  with a single, large step, to avoid a fall   (1) more than one step used to recover equilibrium   (0) No step OR would fall if not caught Score:  0  5. COMPENSATORY STEPPING CORRECTION BACKWARD: Lean backward against my hands beyond your backward limits.  When I let go, do whatever is necessary, including a step to avoid a fall. (2) recovers independently  with a single, large step, to avoid a fall   (1) more than one step used to recover equilibrium   (0) No step OR would fall if not caught Score:   0  6. COMPENSATORY STEPPING LATERAL: Lean into my hand beyond your sideways limit.  When I let go, do whatever is necessary, including taking a step, to avoid a fall. Left           Right   (2) recovers independently with 1 step (crossover or lateral OK)   (1) several steps to recover equilibrium   (0) falls or cannot step Score:     0  (use the side with the lowest score)  SENSORY ORIENTATION 7. STANCE FEET TOGETHER EYES OPEN  firm surface Be as stable and still as possible until I say stop   (2) 30s   (1) < 30 s   (0) unable Score:  2  8. STANCE ON FOAM EYES CLOSED feet together, eyes closed, hands on hips. Be as stable and still as you can until I stay stop.  I will start timing when you close your eyes. Time in seconds:      (2) 30s   (1) <30 s   (0) unable Score:  1  9. INCLINE EYES CLOSED: Stand on incline with feet shoulder width apart, arms down at your sides.  I will start timing when you close your eyes   (2) Stands 30 s and aligns with gravity   (1) stands < 30 s OR aligns with surface   (0) unable Score:  1  DYNAMIC GAIT 10. CHANGE IN GAIT SPEED: begin walking at your normal speed, when I tell you fast, walk as fast as you can, when I say slow walk very slowly   (2) significantly changes walking speed without imbalance   (1) unable to change walking speed or signs of imbalance   (0) unable to achieve significant change in walking speed AND signes of imbalance Score: 2  11. WALK WITH HEAD TURNS HORIZONTAL: begin walking at normal speed, when I say right, turn your head to the right, when I say left turn your head to the left.  Try to keep yourself walking in a straight line   (2) performs head turns with no change in gait speed and good balance   (1) performs head turns with reduction in gait speed   (0)  performs head turns with imbalance Score:  2  12. WALK WITH PIVOT TURNS: begin walking at your normal speed.  When I tell you to turn and stop, turn as  quickly as you can, face the opposite direction and stop.  After the turn, your feet should be close together   (2) turns with feet close FAST in 3 steps with good balance   (1) turns with feet close SLOW > 4 steps with good balance   (0) cannot turn with feet close at any speed without imbalance Score:2  13. STEP OVER OBSTACLES:  ( 9 inch height 10 feet away from subject) begin walking at your normal speed.  When you get to the box, step over it, not around it and keep walking   (2) Able to step over box with minimal change of gait speed and good balance   (1) Steps over box but touches box OR displays cautious behavior by slowing gait   (0) Unable to step over box OR steps around box Score:  2  14. TIMED UP AND GO NORMAL AND WITH DUAL TASK: 3 METER WALK: When I say go walk at your your normal speed across the tape, turn around and come back to sit in the chair   Time: Count backwards by 3s starting at    20.  When I say go, stand up from the chair, walk at your normal speed across the tape on the floor, turn around, come back to sit in the chair.  Continue counting backwards the entire time.  Time:   (2) No noticeable change in sitting, standing or walking while backward counting when compared to TUG without dual task   (1) Dual task affects either counting OR walking (>10%)    (0) stops counting while walking OR stops walking while counting If  gait speed slows more than 10% between TUG without and with dual task, the score should be decreased by a point Score: 1  Total score:       17    /28      TREATMENT DATE:  09/13/24(SBA/CGA at all times)  Step taps to steps 1-2-3 steps at stair holding 10# KB B Running man's focusing on form including arms and prolonged hold on one leg Stepping strategies lateral and backward Decline wall squat 2x10 Hurdles with 3 sec knee raises fwd multiple reps  Divers with one finger support x 5 ea Three way taps to small cones x 5 B Braiding forward  and backward crosses    09/06/24(SBA/CGA at all times) Step taps to steps 1-2-3 steps at stair holding 10# KB B Standing bird dog as warm up for running mans Running man's focusing on form including arms and prolonged hold on one leg Hurdles fwd and lateral with 3 sec knee raises fwd multiple reps - very difficult Divers with one finger support x 5 ea Single leg cone taps on mat table with kickstand B; then without kickstand Diver forward x 5 B reaching for mat Leg Press (seat 7) 105# 2 x 10 bilateral ; 70# unilateral 2 x 10 Cross body taps x 10 B Seated piriformis stretch x 30 sec  Reverse taps x 10 ea Agility ladder - backwards biased   09/04/24 Step taps to steps 1-2-3 steps at stair holding 10# KB B Hurdles with 3 sec knee raises fwd multiple reps - very difficult Running man's focusing on form including arms and prolonged hold on one leg Divers with one finger support x 5 ea Diver forward x 5 B reaching for counter Leg Press (seat 7) 105# 2 x 10 bilateral ; 70# unilateral 2 x 10 Unilateral 20# farmer's carry x 2 laps down long hallways Walking outside on grass to assess ability and form including declines Wall sit on decline board to fatigue    08/29/24 Bike L3 x PT present to discuss status Step ups at stair holding 5# KB x 12 bilateral  Hurdles with 5# KB hold x 3 laps Leg Press (seat 7) 70# 2 x 10 bilateral ; 45# unilateral 2 x 10 Unilateral 10# farmer's carry x 2 laps down long hallways Single leg on therapad + opposite leg swing (forward, lateral, backward) x 8 bilateral  Floor dots various patterns, forward, and lateral - backward most challenging Step taps on BOSU holding 8# DB x 10 bilateral  Single leg cross taps (like braiding) 2x10 B - challenging SBA to CGA   08/23/24 Step ups at stair holding 5# KB x 12 bilateral  Hurdles with 5# KB hold x 3 laps Leg Press (seat 7) 70# 2 x 10 bilateral ; 45# unilateral 2 x 10 Unilateral 10# farmer's carry x 2 laps  down long hallways Single leg on therapad + opposite leg swing (forward, lateral, backward) x 8 bilateral  Agility ladder: various patterns, forward, and lateral - backward most challenging Tandem walking by counter (forwards & backwards) x 3 laps Step taps on BOSU holding 8# DB x 10 bilateral   08/21/24 Bike L 3 x 5 min PT present to discuss status, proper adjustments and posture while on machine for optimum benefit Standing quad stretch x 30 sec B Prone quad stretch 2x30 sec B Seated IASTM with roller to B thighs Retro walking x 6 laps - better with bigger step length SBA Braiding x 6 laps  at [facility] Single leg cross taps (like braiding) 2x10 B - challenging SBA to CGA Walking, lateral steps, weight shifts on cobblestone pads with SBA Attempted cone touches 3 sets of 5 each LE SBA better with shoes off (cone on back counter) Agility ladder: various patterns, forward, backward and lateral - backward most challenging  PATIENT EDUCATION:  Education details: Issued HEP Person educated: Patient Education method: Explanation, Demonstration, and Handouts Education comprehension: verbalized understanding and returned demonstration  HOME EXERCISE PROGRAM: Access Code: [facility] URL: [email or web address] Date: 07/25/2024 Prepared by: Mliss  Exercises - Seated Transversus Abdominis Bracing  - 1 x daily - 7 x weekly - 2 sets - 10 reps - Seated Hamstring Stretch  - 1 x daily - 7 x weekly - 2 reps - 20 sec hold - Seated Piriformis Stretch  - 1 x daily - 7 x weekly - 2 reps - 20 sec hold - Single Leg Stance with Support  - 1 x daily - 7 x weekly - 2 reps - 20 sec hold - Shoulder External Rotation and Scapular Retraction with Resistance  - 1 x daily - 7 x weekly - 2 sets - 10 reps - Standing Shoulder Horizontal Abduction with Resistance  - 1 x daily - 7 x weekly - 2 sets - 10 reps - Standing Shoulder Row with Anchored Resistance  - 1 x daily - 7 x weekly - 2 sets - 10 reps -  Shoulder extension with resistance - Neutral  - 1 x daily - 7 x weekly - 2 sets - 10 reps - Single Leg Balance with Clock Reach  - 1 x daily - 7 x weekly - 10 reps - Standing 'L' Stretch at Counter  - 1 x daily - 7 x weekly - 2 reps - 20 sec hold - Resisted Sit-to-Stand With Dumbbell at Chest  - 1 x daily - 3 x weekly - 2 sets - 10 reps - Side Step Overs with Cones and Unilateral Counter Support  - 1 x daily - 3 x weekly - 2 sets - 10 reps - Standing 3-Way Leg Reach with Resistance at Ankles and Counter Support  - 1 x daily - 3 x weekly - 2 sets - 10 reps - Side Stepping with Resistance at Ankles  - 1 x daily - 3 x weekly - 2 sets - 10 reps - Forward Monster Walk with Resistance at Ankles and Counter Support  - 1 x daily - 3 x weekly - 2 sets - 10 reps - Squat with Chair Touch  - 1 x daily - 3 x weekly - 2 sets - 10 reps - Kneeling Plantarflexion Stretch  - 1 x daily - 3 x weekly - 1 sets - 3 reps - 30 sec hold - Supine Ankle Dorsiflexion and Plantarflexion AROM  - 1 x daily - 7 x weekly - 2 sets - 10 reps - Supine Ankle Inversion and Eversion AROM  - 1 x daily - 7 x weekly - 2 sets - 10 reps - Supine Ankle Circles  - 1 x daily - 7 x weekly - 2 sets - 10 reps - Seated Ankle Alphabet  - 1 x daily - 7 x weekly - 1 sets - 1 reps  ASSESSMENT:  CLINICAL IMPRESSION: Patient presents with significant improvement today in balance tasks particularly stepping over hurdles and with stepping strategies. He was able to use catch himself independently laterally with two steps and posteriorly with one step. He is still challenged with  SLS activities particularly on the R side.  He reports his back is mainly just sore now, although today it was flared up per patient. Patient still intermittently loses his balance with gait and occasionally catches his toe if an object is too high. Patient continues to demonstrate potential for improvement and would benefit from continued skilled therapy to address impairments.   Re-auth will need to be submitted next visit.     OBJECTIVE IMPAIRMENTS: decreased balance, decreased strength, increased muscle spasms, impaired flexibility, postural dysfunction, and pain.   ACTIVITY LIMITATIONS: lifting, standing, and stairs  PARTICIPATION LIMITATIONS: driving, community activity, and yard work  PERSONAL FACTORS: Time since onset of injury/illness/exacerbation and 1-2 comorbidities: OA, right hip arthroplasty are also affecting patient's functional outcome.   REHAB POTENTIAL: Good  CLINICAL DECISION MAKING: Stable/uncomplicated  EVALUATION COMPLEXITY: Low   GOALS: Goals reviewed with patient? Yes  SHORT TERM GOALS: Target date: 06/16/2024  Patient will be independent with initial HEP. Baseline: Goal status: Met on 05/31/24  2.  Patient will demonstrate and verbalize knowledge of proper lifting techniques to allow him to lift suitcases for travel. Baseline:  Goal status: Ongoing   LONG TERM GOALS: Target date: 10/11/2024    Patient will be independent with advanced HEP to allow for self progression after discharge. Baseline:  Goal status: INITIAL  2.  Patient will improve modified Oswestry to not greater than 5% to demonstrate improved functional mobility. Baseline: 18% Goal status: IN PROGRESS 07/05/24  8%   3.  Patient will increase right hip strength to [facility] to allow him to navigate stairs with reciprocal pattern without difficulty/discomfort. Baseline:  Goal status: MET 07/05/24  4.  Patient will increase bilateral single leg stance to greater than 10 seconds to decrease patients fall risk. Baseline: right- 1.53 sec, left- 1.40 sec Goal status: IN PROGRESS 09/04/24  5.  Patient will report ability to ambulate over various unlevel surfaces for desired length (at least greater than 30 minutes) without a loss of balance to allow patient to travel and sight-see. Baseline:  Goal status: ongoing  6 Patient will improve dynamic balance by using an ankle  strategy in order to reduce loss of balance in the posterior direction New  7. Improved mini- best score to 23/28 indicating improved dynamic balance and decreased risk of falls  PLAN:  PT FREQUENCY: 2x/week  PT DURATION: 12 weeks  (pt will be traveling for a few weeks in September)  PLANNED INTERVENTIONS: 02835- PT Re-evaluation, 97750- Physical Performance Testing, 97110-Therapeutic exercises, 97530- Therapeutic activity, [id number]- Neuromuscular re-education, 97535- Self Care, [id number]- Manual therapy, [id number]- Gait training, [phone]- Canalith repositioning, [id number]- Aquatic Therapy, [phone]- Electrical stimulation (unattended), [phone]- Electrical stimulation (manual), [id number]- Ultrasound, [id number]- Traction (mechanical), [id number]- Ionotophoresis 4mg /ml Dexamethasone , 79439 (1-2 muscles), [id number] (3+ muscles)- Dry Needling, Patient/Family education, Balance training, Stair training, Taping, Joint mobilization, Joint manipulation, Spinal manipulation, Spinal mobilization, Cryotherapy, and Moist heat.  PLAN FOR NEXT SESSION: Submit Reauth next visit. continue with prolonged and dynamic SLS, agility ladder with bwd activities, and crossing midline activities, eccentric quad strengthening  Mliss Cummins, PT  09/13/24 1:20 PM [facility] [address] [place], [place] [address] Phone # [phone] Fax [phone]

## 2024-09-15 ENCOUNTER — Encounter: Payer: Self-pay | Admitting: Physical Therapy

## 2024-09-15 ENCOUNTER — Ambulatory Visit: Payer: Self-pay | Admitting: Physical Therapy

## 2024-09-15 DIAGNOSIS — M6281 Muscle weakness (generalized): Secondary | ICD-10-CM | POA: Diagnosis not present

## 2024-09-15 DIAGNOSIS — R262 Difficulty in walking, not elsewhere classified: Secondary | ICD-10-CM

## 2024-09-15 DIAGNOSIS — R2689 Other abnormalities of gait and mobility: Secondary | ICD-10-CM

## 2024-09-15 DIAGNOSIS — M5459 Other low back pain: Secondary | ICD-10-CM

## 2024-09-15 DIAGNOSIS — R293 Abnormal posture: Secondary | ICD-10-CM | POA: Diagnosis not present

## 2024-09-15 DIAGNOSIS — R252 Cramp and spasm: Secondary | ICD-10-CM | POA: Diagnosis not present

## 2024-09-15 NOTE — Therapy (Signed)
 OUTPATIENT PHYSICAL THERAPY TREATMENT AND PROGRESS NOTE  Reporting Period 07/20/24 to 09/15/24   See note below for Objective Data and Assessment of Progress/Goals.    Patient Name: [name] MRN: [id number] DOB:1946-05-19, 78 y.o., male Today's Date: 09/15/2024  END OF SESSION:  PT End of Session - 09/15/24 1106     Visit Number 18    Date for Recertification  10/11/24    Authorization Type [employer] MCR - re-auth submitted 10/20 to 12/8    Authorization - Visit Number 18    Authorization - Number of Visits 18    Progress Note Due on Visit 28    PT Start Time 1020    PT Stop Time 1105    PT Time Calculation (min) 45 min    Activity Tolerance Patient tolerated treatment well    Behavior During Therapy [facility] for tasks assessed/performed                      Past Medical History:  Diagnosis Date   Abscess    right posterior neck   Anticoagulation adequate 06/07/2019   Overview:  SSS, paroxAFib/Flutter, CHADSvasc= 3 age, h/o cva; chronic OAC Xarelto    Atrial fibrillation and flutter (HCC) 01/01/2015   Noted 2016. Xarelto     BPH associated with nocturia 08/14/2014   Finasteride , flomax .  0-3x nocturia. PSA to 14-->eval urology [facility]. PSA trended back down to about 2.6 per records in 2015 then placed on finasteride - had significant voiding issues when spiked to 14- improved on meds    Bradycardia    CAD (coronary artery disease) 05/26/2016   Coronary CTA_ 50-75% D1 off CT report    Cardiac pacemaker in situ 12/28/2009   SA node dysfunction as cause    Chest pressure 12/01/2015   Overview:  atypical CP, [facility] nuclear perfusion imaging w/ no inarct, no ischemia, EF-40% NICM   Chronic systolic heart failure (HCC) 12/01/2015   Overview:  [facility] nuclear perfusion imaging study, no inarct, no ischemia, EF-40% NICM   CVA (cerebral vascular accident) (HCC)    Double vision 07/04/2018   Elevated LFTs    Erectile dysfunction 01/30/2015   cialis  prn    External hemorrhoids     Fitting or adjustment of cardiac pacemaker 06/07/2019   Overview:  Biotronik Perham DR [id number], RA and RV leads [id number]:  [id number] and [id number]   Former smoker 01/30/2015   Former smoker. 20 pack years quit 92. Patient declined AAA screen.     GERD (gastroesophageal reflux disease)    Gout 11/11/2009   Qualifier: Diagnosis of  By: [name]     History of nuclear stress test 06/07/2019   Overview:  [facility], no inarct, no ischemia, EF-40% NICM   HTN (hypertension)    Hyperglycemia 06/10/2017   110s CBG 2018   Hyperlipidemia 07/12/2007   Crestor  20mg  daily with some myalgias; fish oil. Trig 200-500 despite this. LDL ok.        Insomnia 01/30/2015   ambien  5mg  prn. May refill.     Ischemic optic neuropathy    Left chest pressure 03/19/2015   Other and unspecified hyperlipidemia    Sinoatrial node dysfunction (HCC)    Spinal stenosis    Trigger finger, right middle finger 03/01/2018   Past Surgical History:  Procedure Laterality Date   ABSCESS DRAINAGE     righ tposterior neck   CARDIAC CATHETERIZATION  1996   CHOLECYSTECTOMY  02/07/2003   COLONOSCOPY  06/23/2004   EYE SURGERY  left eye   left ingunial hernia  06/08/2011   PACEMAKER INSERTION     PPM GENERATOR CHANGEOUT N/A 07/12/2024   Procedure: PPM GENERATOR CHANGEOUT;  Surgeon: Nathan Danelle ORN, MD;  Location: [facility] INVASIVE CV LAB;  Service: Cardiovascular;  Laterality: N/A;   right hip replacement     2012 Dr. Hiram   Patient Active Problem List   Diagnosis Date Noted   Aortic atherosclerosis 08/01/2020   Fatty liver 08/01/2020   Paroxysmal atrial fibrillation (HCC) 07/27/2019   Anticoagulation adequate 06/07/2019   Convergence insufficiency 06/07/2019   Fitting or adjustment of cardiac pacemaker 06/07/2019   Pacemaker-dependent due to native cardiac rhythm insufficient to support life 06/07/2019   Palpitations 06/07/2019   History of nuclear stress test 06/07/2019   S/P cardiac cath 06/07/2019   Non-occlusive coronary  artery disease 06/07/2019   Double vision 07/04/2018   Trigger finger, right middle finger 03/01/2018   Hyperglycemia 06/10/2017   Solitary pulmonary nodule 05/26/2016   CAD (coronary artery disease) 05/26/2016   Nonischemic cardiomyopathy (HCC) 04/23/2016   Chronic systolic heart failure (HCC) 12/01/2015   Chest pressure 12/01/2015   Left chest pressure 03/19/2015   Insomnia 01/30/2015   Erectile dysfunction 01/30/2015   Cerebral infarction (HCC) 01/30/2015   Former smoker 01/30/2015   Atrial fibrillation and flutter (HCC) 01/01/2015   Allergic rhinitis 10/11/2014   BPH associated with nocturia 08/14/2014   Ventricular tachycardia, non-sustained (HCC) 01/25/2013   Sinoatrial node dysfunction (HCC)    SPINAL STENOSIS, LUMBAR 08/22/2010   GERD 03/17/2010   Cardiac pacemaker in situ 12/28/2009   Gout 11/11/2009   Hyperlipidemia 07/12/2007   NEUROPATHY, ISCHEMIC OPTIC 06/02/2007   Essential hypertension 06/02/2007    PCP: [name]., MD  REFERRING PROVIDER: [name], DO  REFERRING DIAG: M54.42,M54.41,G89.29 (ICD-10-CM) - Chronic bilateral low back pain with bilateral sciatica M51.362 (ICD-10-CM) - Degeneration of intervertebral disc of lumbar region with discogenic back pain and lower extremity pain  Rationale for Evaluation and Treatment: Rehabilitation  THERAPY DIAG:  Other abnormalities of gait and mobility  Other low back pain  Difficulty in walking, not elsewhere classified  Muscle weakness (generalized)  Balance disorder  ONSET DATE: February 2024 first visit to Dr Nathan  SUBJECTIVE:                                                                                                                                                                                           SUBJECTIVE STATEMENT: I'm going to have to pull a suitcase through the snow and ice.   PERTINENT HISTORY:  [name] frequently (next upcoming trip 7/14 for a [place] cruise) OA,  trigger fingers, right hip arthroplasty, pacemaker CVA 30 years ago affecting L side  PAIN:  07/27/24 Are you having pain? Yes: NPRS scale: 2/10 Pain location: low back Pain description: discomfort Aggravating factors: worst first thing in the morning or after lying on his couch, swimming Relieving factors: medication, movement  PRECAUTIONS: ICD/Pacemaker  RED FLAGS: None   WEIGHT BEARING RESTRICTIONS: No  FALLS:  Has patient fallen in last 6 months? Yes. Number of falls 1 fall.  Tripped over a chair while traveling at [place]  LIVING ENVIRONMENT: Lives with: lives with their spouse Lives in: House/apartment Stairs: one story Has following equipment at home: None  OCCUPATION: Retired  PLOF: Independent and Leisure: traveling (many upcoming trips planned), swimming  PATIENT GOALS: To feel better and not have to be reliant on pain medication as much and improve balance and flexibility.  NEXT MD VISIT: Follow up with Dr Nathan as needed  OBJECTIVE:  Note: Objective measures were completed at Evaluation unless otherwise noted.  DIAGNOSTIC FINDINGS:  Lumbar CT Scan on 02/04/2023: IMPRESSION: Multilevel degenerative changes which are most severe at the L3-4 level with significant spinal stenosis. No acute traumatic abnormalities identified.  PATIENT SURVEYS:  Modified Oswestry:  MODIFIED OSWESTRY DISABILITY SCALE   Date: 05/26/2024 Score  Pain intensity 2 =  Pain medication provides me with complete relief from pain.  2. Personal care (washing, dressing, etc.) 0 =  I can take care of myself normally without causing increased pain.  3. Lifting 2 = Pain prevents me from lifting heavy weights off the floor, activities (eg. sports, dancing). but I can manage if the weights are conveniently positioned (3) Pain prevents me from going out very often. (eg, on a table).  4. Walking 0 = Pain does not prevent me from walking any distance  5. Sitting 0 =  I can sit in any chair as long  as I like.  6. Standing 1 =  I can stand as long as I want but, it increases my pain.  7. Sleeping 1 = I can sleep well only by using pain medication.  8. Social Life 0 = My social life is normal and does not increase my pain.  9. Traveling 2 =  My pain restricts my travel over 2 hours.  10. Employment/ Homemaking 1 = My normal homemaking/job activities increase my pain, but I can still perform all that is required of me  Total 9 / 50 = 18.0 %   Interpretation of scores: Score Category Description  0-20% Minimal Disability The patient can cope with most living activities. Usually no treatment is indicated apart from advice on lifting, sitting and exercise  21-40% Moderate Disability The patient experiences more pain and difficulty with sitting, lifting and standing. Travel and social life are more difficult and they may be disabled from work. Personal care, sexual activity and sleeping are not grossly affected, and the patient can usually be managed by conservative means  41-60% Severe Disability Pain remains the main problem in this group, but activities of daily living are affected. These patients require a detailed investigation  61-80% Crippled Back pain impinges on all aspects of the patient's life. Positive intervention is required  81-100% Bed-bound  These patients are either bed-bound or exaggerating their symptoms  Bluford FORBES Zoe DELENA Karon DELENA, et al. Surgery versus conservative management of stable thoracolumbar fracture: the PRESTO feasibility RCT. [place] ([place]): [employer]; 2021 Nov. [facility] Technology Assessment, No. 25.62.) Appendix 3, Oswestry Disability Index category descriptors.  Available from: FindJewel[employer]cz  Minimally Clinically Important Difference (MCID) = 12.8%   8/6/2 ODI 4/50 = 8%  COGNITION: Overall cognitive status: Within functional limits for tasks assessed     SENSATION: Patient states that he has a neuropathy  problem in his feet, but it has improved since starting Gabapentin   MUSCLE LENGTH: Hamstrings: increased tightness bilaterally Piriformis:  tightness bilat  POSTURE: rounded shoulders and forward head   LUMBAR ROM:   Eval:  Decreased by approx 50%  LOWER EXTREMITY ROM:     WFL  LOWER EXTREMITY MMT:    Eval:   LE grossly WNL, but grossly 4+ to 5-/5 in right hip Core strength grossly 4-/5 throughout  LUMBAR SPECIAL TESTS:  Slump test: Negative  FUNCTIONAL TESTS:  Eval: 5 times sit to stand: 11.87 sec Single Leg Stance:  right- 1.53 sec, left- 1.40 sec  09/04/24  SLS R 4 sec; L 5 sec 09/13/24 SLS R 4 sec; L 6 sec  GAIT: Distance walked: >500 ft Assistive device utilized: None Level of assistance: Complete Independence Comments: States that he has more difficulty with balance on unlevel surfaces (like grass) than firm surfaces.     MINI-BESTest: Balance Evaluation Systems Test ANTICIPATORY: SIT TO STAND: (2) normal without use of hands and stabilizes independently     (1) Moderate comes to stand WITH use of hands on 1st attempt    (0) Severe: unable to stand up from chair without assistance OR needs several attempts with use of   hands Score:  2 07/19/24, 2 09/15/24  2. RISE TO TOES: feet shoulder width apart. Hands on hips.  Rise as high as you can onto your toes. Try to hold this pose for 3 sec                          (2) stable for 3s with maximum height    (1) heels up, but not full range (smaller than when holding hands) OR noticeable instability for 3 sec                           (0) < or equat to 3 s  Score:  1 07/19/24, 2 09/15/24  3. STAND ON ONE LEG: look straight ahead. Hands on hips. Lift your leg off the ground.     Left:  trial 1 __3__ trial 2__6_                                    Right: Trial 1:___1   Trial 2:____3___   (2) 20 s       (2)  20 s                (1) < 20      (1) < 20 s   (0) Unable      (0) unable Score (use the worst side):  1  07/19/24; 1 09/15/24  REACTIVE POSTURAL CONTROL 4. COMPENSATORY STEPPING CORRECTION FORWARD stand with feet shoulder width apart, arms at your sides. Lean forward against my hands beyond your forward limits.  When I let go, do whatever is necessary, including taking a step, to avoid a fall   (2) recovers independently  with a single, large step, to avoid a fall   (1) more than one step used to recover equilibrium   (0) No step OR  would fall if not caught Score:  0 07/19/24; 2 10/17  5. COMPENSATORY STEPPING CORRECTION BACKWARD: Lean backward against my hands beyond your backward limits.  When I let go, do whatever is necessary, including a step to avoid a fall. (2) recovers independently  with a single, large step, to avoid a fall   (1) more than one step used to recover equilibrium   (0) No step OR would fall if not caught Score:  0  07/19/24, 1 09/15/24  6. COMPENSATORY STEPPING LATERAL: Lean into my hand beyond your sideways limit.  When I let go, do whatever is necessary, including taking a step, to avoid a fall. Left           Right   (2) recovers independently with 1 step (crossover or lateral OK)   (1) several steps to recover equilibrium   (0) falls or cannot step Score:     0  (use the side with the lowest score) at eval;  L  and R 2  09/15/24  SENSORY ORIENTATION 7. STANCE FEET TOGETHER EYES OPEN  firm surface Be as stable and still as possible until I say stop   (2) 30s   (1) < 30 s   (0) unable Score:  2 07/19/24 and 09/15/24  8. STANCE ON FOAM EYES CLOSED feet together, eyes closed, hands on hips. Be as stable and still as you can until I stay stop.  I will start timing when you close your eyes. Time in seconds:      (2) 30s   (1) <30 s   (0) unable Score:  1 at eval and 09/15/24  9. INCLINE EYES CLOSED: Stand on incline with feet shoulder width apart, arms down at your sides.  I will start timing when you close your eyes   (2) Stands 30 s and aligns with gravity   (1)  stands < 30 s OR aligns with surface   (0) unable Score:  1 07/19/24 and 09/15/24  DYNAMIC GAIT 10. CHANGE IN GAIT SPEED: begin walking at your normal speed, when I tell you fast, walk as fast as you can, when I say slow walk very slowly   (2) significantly changes walking speed without imbalance   (1) unable to change walking speed or signs of imbalance   (0) unable to achieve significant change in walking speed AND signes of imbalance Score: 2 07/19/24 and 09/15/24  11. WALK WITH HEAD TURNS HORIZONTAL: begin walking at normal speed, when I say right, turn your head to the right, when I say left turn your head to the left.  Try to keep yourself walking in a straight line   (2) performs head turns with no change in gait speed and good balance   (1) performs head turns with reduction in gait speed   (0)  performs head turns with imbalance Score:  2 07/19/24 0 09/15/24  12. WALK WITH PIVOT TURNS: begin walking at your normal speed.  When I tell you to turn and stop, turn as quickly as you can, face the opposite direction and stop.  After the turn, your feet should be close together   (2) turns with feet close FAST in 3 steps with good balance   (1) turns with feet close SLOW > 4 steps with good balance   (0) cannot turn with feet close at any speed without imbalance Score:2 07/19/24 and 10/17  13. STEP OVER OBSTACLES:  ( 9 inch height 10 feet away from  subject) begin walking at your normal speed.  When you get to the box, step over it, not around it and keep walking   (2) Able to step over box with minimal change of gait speed and good balance   (1) Steps over box but touches box OR displays cautious behavior by slowing gait   (0) Unable to step over box OR steps around box Score:  2 07/19/24 and 10/17  14. TIMED UP AND GO NORMAL AND WITH DUAL TASK: 3 METER WALK: When I say go walk at your your normal speed across the tape, turn around and come back to sit in the chair   Time: Count  backwards by 3s starting at    20.  When I say go, stand up from the chair, walk at your normal speed across the tape on the floor, turn around, come back to sit in the chair.  Continue counting backwards the entire time.  Time:   (2) No noticeable change in sitting, standing or walking while backward counting when compared to TUG without dual task   (1) Dual task affects either counting OR walking (>10%)    (0) stops counting while walking OR stops walking while counting If gait speed slows more than 10% between TUG without and with dual task, the score should be decreased by a point Score: 1 8/20/252 10/17   Total score:       17    /28  on 07/19/24    20/28 on 10 /17/ 25      TREATMENT DATE:  09/16/24(SBA/CGA at all times) Mini best test completed Reach behinds to counter in different stance positions Bouncing lacrosse ball weaving in/out of cones - no problem Step ups 20# KB onto aerobic step + 1 riser x 10 B   09/13/24(SBA/CGA at all times) Step taps to steps 1-2-3 steps at stair holding 10# KB B Running man's focusing on form including arms and prolonged hold on one leg Stepping strategies lateral and backward Decline wall squat 2x10 Hurdles with 3 sec knee raises fwd multiple reps  Divers with one finger support x 5 ea Three way taps to small cones x 5 B Braiding forward and backward crosses    09/06/24(SBA/CGA at all times) Step taps to steps 1-2-3 steps at stair holding 10# KB B Standing bird dog as warm up for running mans Running man's focusing on form including arms and prolonged hold on one leg Hurdles fwd and lateral with 3 sec knee raises fwd multiple reps - very difficult Divers with one finger support x 5 ea Single leg cone taps on mat table with kickstand B; then without kickstand Diver forward x 5 B reaching for mat Leg Press (seat 7) 105# 2 x 10 bilateral ; 70# unilateral 2 x 10 Cross body taps x 10 B Seated piriformis stretch x 30 sec  Reverse taps x 10  ea Agility ladder - backwards biased   09/04/24 Step taps to steps 1-2-3 steps at stair holding 10# KB B Hurdles with 3 sec knee raises fwd multiple reps - very difficult Running man's focusing on form including arms and prolonged hold on one leg Divers with one finger support x 5 ea Diver forward x 5 B reaching for counter Leg Press (seat 7) 105# 2 x 10 bilateral ; 70# unilateral 2 x 10 Unilateral 20# farmer's carry x 2 laps down long hallways Walking outside on grass to assess ability and form including declines Wall sit on decline  board to fatigue    08/29/24 Bike L3 x PT present to discuss status Step ups at stair holding 5# KB x 12 bilateral  Hurdles with 5# KB hold x 3 laps Leg Press (seat 7) 70# 2 x 10 bilateral ; 45# unilateral 2 x 10 Unilateral 10# farmer's carry x 2 laps down long hallways Single leg on therapad + opposite leg swing (forward, lateral, backward) x 8 bilateral  Floor dots various patterns, forward, and lateral - backward most challenging Step taps on BOSU holding 8# DB x 10 bilateral  Single leg cross taps (like braiding) 2x10 B - challenging SBA to CGA   08/23/24 Step ups at stair holding 5# KB x 12 bilateral  Hurdles with 5# KB hold x 3 laps Leg Press (seat 7) 70# 2 x 10 bilateral ; 45# unilateral 2 x 10 Unilateral 10# farmer's carry x 2 laps down long hallways Single leg on therapad + opposite leg swing (forward, lateral, backward) x 8 bilateral  Agility ladder: various patterns, forward, and lateral - backward most challenging Tandem walking by counter (forwards & backwards) x 3 laps Step taps on BOSU holding 8# DB x 10 bilateral   08/21/24 Bike L 3 x 5 min PT present to discuss status, proper adjustments and posture while on machine for optimum benefit Standing quad stretch x 30 sec B Prone quad stretch 2x30 sec B Seated IASTM with roller to B thighs Retro walking x 6 laps - better with bigger step length SBA Braiding x 6 laps at  [facility] Single leg cross taps (like braiding) 2x10 B - challenging SBA to CGA Walking, lateral steps, weight shifts on cobblestone pads with SBA Attempted cone touches 3 sets of 5 each LE SBA better with shoes off (cone on back counter) Agility ladder: various patterns, forward, backward and lateral - backward most challenging  PATIENT EDUCATION:  Education details: Issued HEP Person educated: Patient Education method: Explanation, Demonstration, and Handouts Education comprehension: verbalized understanding and returned demonstration  HOME EXERCISE PROGRAM: Access Code: [facility] URL: [email or web address] Date: 07/25/2024 Prepared by: Mliss  Exercises - Seated Transversus Abdominis Bracing  - 1 x daily - 7 x weekly - 2 sets - 10 reps - Seated Hamstring Stretch  - 1 x daily - 7 x weekly - 2 reps - 20 sec hold - Seated Piriformis Stretch  - 1 x daily - 7 x weekly - 2 reps - 20 sec hold - Single Leg Stance with Support  - 1 x daily - 7 x weekly - 2 reps - 20 sec hold - Shoulder External Rotation and Scapular Retraction with Resistance  - 1 x daily - 7 x weekly - 2 sets - 10 reps - Standing Shoulder Horizontal Abduction with Resistance  - 1 x daily - 7 x weekly - 2 sets - 10 reps - Standing Shoulder Row with Anchored Resistance  - 1 x daily - 7 x weekly - 2 sets - 10 reps - Shoulder extension with resistance - Neutral  - 1 x daily - 7 x weekly - 2 sets - 10 reps - Single Leg Balance with Clock Reach  - 1 x daily - 7 x weekly - 10 reps - Standing 'L' Stretch at Counter  - 1 x daily - 7 x weekly - 2 reps - 20 sec hold - Resisted Sit-to-Stand With Dumbbell at Chest  - 1 x daily - 3 x weekly - 2 sets - 10 reps - Side Step Overs with Cones  and Unilateral Counter Support  - 1 x daily - 3 x weekly - 2 sets - 10 reps - Standing 3-Way Leg Reach with Resistance at Ankles and Counter Support  - 1 x daily - 3 x weekly - 2 sets - 10 reps - Side Stepping with Resistance at Ankles  - 1 x  daily - 3 x weekly - 2 sets - 10 reps - Forward Monster Walk with Resistance at Ankles and Counter Support  - 1 x daily - 3 x weekly - 2 sets - 10 reps - Squat with Chair Touch  - 1 x daily - 3 x weekly - 2 sets - 10 reps - Kneeling Plantarflexion Stretch  - 1 x daily - 3 x weekly - 1 sets - 3 reps - 30 sec hold - Supine Ankle Dorsiflexion and Plantarflexion AROM  - 1 x daily - 7 x weekly - 2 sets - 10 reps - Supine Ankle Inversion and Eversion AROM  - 1 x daily - 7 x weekly - 2 sets - 10 reps - Supine Ankle Circles  - 1 x daily - 7 x weekly - 2 sets - 10 reps - Seated Ankle Alphabet  - 1 x daily - 7 x weekly - 1 sets - 1 reps  ASSESSMENT:  CLINICAL IMPRESSION:  Patient presents with significant improvement today in balance tasks particularly stepping over hurdles and with stepping strategies. He requires 1-2 steps posteriorly but only one step with forward and laterally. He continues to be very challenged with SLS activities particularly on the R side although his stance time has improved since eval.  He reports his back is mainly just sore now.  Patient still intermittently loses his balance with gait and occasionally catches his toe if an object is too high. Patient continues to demonstrate potential for improvement and would benefit from continued skilled therapy to address impairments. I recommend ongoing PT at 2x/per week to achieve unmet goals.    OBJECTIVE IMPAIRMENTS: decreased balance, decreased strength, increased muscle spasms, impaired flexibility, postural dysfunction, and pain.   ACTIVITY LIMITATIONS: lifting, standing, and stairs  PARTICIPATION LIMITATIONS: driving, community activity, and yard work  PERSONAL FACTORS: Time since onset of injury/illness/exacerbation and 1-2 comorbidities: OA, right hip arthroplasty are also affecting patient's functional outcome.   REHAB POTENTIAL: Good  CLINICAL DECISION MAKING: Stable/uncomplicated  EVALUATION COMPLEXITY:  Low   GOALS: Goals reviewed with patient? Yes  SHORT TERM GOALS: Target date: 06/16/2024  Patient will be independent with initial HEP. Baseline: Goal status: Met on 05/31/24  2.  Patient will demonstrate and verbalize knowledge of proper lifting techniques to allow him to lift suitcases for travel. Baseline:  Goal status: MET   LONG TERM GOALS: Target date: 10/11/2024    Patient will be independent with advanced HEP to allow for self progression after discharge. Baseline:  Goal status: IN PROGRESS 10/17  2.  Patient will improve modified Oswestry to not greater than 5% to demonstrate improved functional mobility. Baseline: 18% Goal status: IN PROGRESS 07/05/24  8%   3.  Patient will increase right hip strength to [facility] to allow him to navigate stairs with reciprocal pattern without difficulty/discomfort. Baseline:  Goal status: MET 07/05/24  4.  Patient will increase bilateral single leg stance to greater than 5 seconds to decrease patients fall risk. Baseline: right- 1.53 sec, left- 1.40 sec Goal status: IN PROGRESS 09/15/24 MODIFIED TO 5 SECONDS  5.  Patient will report ability to ambulate over various unlevel surfaces for  desired length (at least greater than 30 minutes) without a loss of balance to allow patient to travel and sight-see. Baseline:  Goal status: IN PROGRESS 10/17  6 Patient will improve dynamic balance by using an ankle strategy in order to reduce loss of balance in the posterior direction Baseline:  Goal status: IN PROGRESS 10/17  7. Improved mini- best score to 23/28 indicating improved dynamic balance and decreased risk of falls  Baseline: 17/28 Goal status: IN PROGRESS 10/17  20/28  PLAN:  PT FREQUENCY: 2x/week  PT DURATION: 12 weeks  (pt will be traveling for a few weeks in September)  PLANNED INTERVENTIONS: 02835- PT Re-evaluation, 97750- Physical Performance Testing, 97110-Therapeutic exercises, 97530- Therapeutic activity, [id number]- Neuromuscular  re-education, 97535- Self Care, 02859- Manual therapy, [id number]- Gait training, [phone]- Canalith repositioning, [id number]- Aquatic Therapy, [phone]- Electrical stimulation (unattended), [id number]- Electrical stimulation (manual), [id number]- Ultrasound, [id number]- Traction (mechanical), [id number]- Ionotophoresis 4mg /ml Dexamethasone , 79439 (1-2 muscles), 20561 (3+ muscles)- Dry Needling, Patient/Family education, Balance training, Stair training, Taping, Joint mobilization, Joint manipulation, Spinal manipulation, Spinal mobilization, Cryotherapy, and Moist heat.  PLAN FOR NEXT SESSION: continue with prolonged and dynamic SLS, agility ladder with bwd activities, and crossing midline activities, eccentric quad strengthening  Mliss Cummins, PT  09/15/24 11:34 AM [facility] [address] [place], [place] [address] Phone # [phone] Fax [phone]

## 2024-09-17 ENCOUNTER — Other Ambulatory Visit: Payer: Self-pay | Admitting: Sports Medicine

## 2024-09-17 DIAGNOSIS — M47816 Spondylosis without myelopathy or radiculopathy, lumbar region: Secondary | ICD-10-CM

## 2024-09-17 DIAGNOSIS — G8929 Other chronic pain: Secondary | ICD-10-CM

## 2024-09-17 DIAGNOSIS — M51362 Other intervertebral disc degeneration, lumbar region with discogenic back pain and lower extremity pain: Secondary | ICD-10-CM

## 2024-09-17 NOTE — Therapy (Signed)
 OUTPATIENT PHYSICAL THERAPY TREATMENT     Patient Name: [name] MRN: [id number] DOB:[birth year], 78 y.o., male Today's Date: 09/18/2024  END OF SESSION:  PT End of Session - 09/18/24 1103     Visit Number 19    Date for Recertification  10/11/24    Authorization Time Period 07/20/24 to 10/11/24    PT Start Time 1103    PT Stop Time 1145    PT Time Calculation (min) 42 min    Behavior During Therapy [facility] for tasks assessed/performed                       Past Medical History:  Diagnosis Date   Abscess    right posterior neck   Anticoagulation adequate 06/07/2019   Overview:  SSS, paroxAFib/Flutter, CHADSvasc= 3 age, h/o cva; chronic OAC Xarelto    Atrial fibrillation and flutter (HCC) 01/01/2015   Noted 2016. Xarelto     BPH associated with nocturia 08/14/2014   Finasteride , flomax .  0-3x nocturia. PSA to 14-->eval urology [facility]. PSA trended back down to about 2.6 per records in 2015 then placed on finasteride - had significant voiding issues when spiked to 14- improved on meds    Bradycardia    CAD (coronary artery disease) 05/26/2016   Coronary CTA_ 50-75% D1 off CT report    Cardiac pacemaker in situ 12/28/2009   SA node dysfunction as cause    Chest pressure 12/01/2015   Overview:  atypical CP, [facility] nuclear perfusion imaging w/ no inarct, no ischemia, EF-40% NICM   Chronic systolic heart failure (HCC) 12/01/2015   Overview:  [facility] nuclear perfusion imaging study, no inarct, no ischemia, EF-40% NICM   CVA (cerebral vascular accident) (HCC)    Double vision 07/04/2018   Elevated LFTs    Erectile dysfunction 01/30/2015   cialis  prn    External hemorrhoids    Fitting or adjustment of cardiac pacemaker 06/07/2019   Overview:  Biotronik Eaton DR [id number], RA and RV leads [id number]:  [id number] and [id number]   Former smoker 01/30/2015   Former smoker. 20 pack years quit 92. Patient declined AAA screen.     GERD (gastroesophageal reflux disease)    Gout 11/11/2009    Qualifier: Diagnosis of  By: Gardenia Pao     History of nuclear stress test 06/07/2019   Overview:  [facility], no inarct, no ischemia, EF-40% NICM   HTN (hypertension)    Hyperglycemia 06/10/2017   110s CBG 2018   Hyperlipidemia 07/12/2007   Crestor  20mg  daily with some myalgias; fish oil. Trig 200-500 despite this. LDL ok.        Insomnia 01/30/2015   ambien  5mg  prn. May refill.     Ischemic optic neuropathy    Left chest pressure 03/19/2015   Other and unspecified hyperlipidemia    Sinoatrial node dysfunction (HCC)    Spinal stenosis    Trigger finger, right middle finger 03/01/2018   Past Surgical History:  Procedure Laterality Date   ABSCESS DRAINAGE     righ tposterior neck   CARDIAC CATHETERIZATION  1996   CHOLECYSTECTOMY  02/07/2003   COLONOSCOPY  06/23/2004   EYE SURGERY     left eye   left ingunial hernia  06/08/2011   PACEMAKER INSERTION     PPM GENERATOR CHANGEOUT N/A 07/12/2024   Procedure: PPM GENERATOR CHANGEOUT;  Surgeon: Waddell Danelle ORN, MD;  Location: [facility] INVASIVE CV [facility];  Service: Cardiovascular;  Laterality: N/A;   right hip replacement     2012  Dr. Hiram   Patient Active Problem List   Diagnosis Date Noted   Aortic atherosclerosis 08/01/2020   Fatty liver 08/01/2020   Paroxysmal atrial fibrillation (HCC) 07/27/2019   Anticoagulation adequate 06/07/2019   Convergence insufficiency 06/07/2019   Fitting or adjustment of cardiac pacemaker 06/07/2019   Pacemaker-dependent due to native cardiac rhythm insufficient to support life 06/07/2019   Palpitations 06/07/2019   History of nuclear stress test 06/07/2019   S/P cardiac cath 06/07/2019   Non-occlusive coronary artery disease 06/07/2019   Double vision 07/04/2018   Trigger finger, right middle finger 03/01/2018   Hyperglycemia 06/10/2017   Solitary pulmonary nodule 05/26/2016   CAD (coronary artery disease) 05/26/2016   Nonischemic cardiomyopathy (HCC) 04/23/2016   Chronic systolic heart failure (HCC)  98/98/7982   Chest pressure 12/01/2015   Left chest pressure 03/19/2015   Insomnia 01/30/2015   Erectile dysfunction 01/30/2015   Cerebral infarction (HCC) 01/30/2015   Former smoker 01/30/2015   Atrial fibrillation and flutter (HCC) 01/01/2015   Allergic rhinitis 10/11/2014   BPH associated with nocturia 08/14/2014   Ventricular tachycardia, non-sustained (HCC) 01/25/2013   Sinoatrial node dysfunction (HCC)    SPINAL STENOSIS, LUMBAR 08/22/2010   GERD 03/17/2010   Cardiac pacemaker in situ 12/28/2009   Gout 11/11/2009   Hyperlipidemia 07/12/2007   NEUROPATHY, ISCHEMIC OPTIC 06/02/2007   Essential hypertension 06/02/2007    PCP: [name]., MD  REFERRING PROVIDER: [name], DO  REFERRING DIAG: M54.42,M54.41,G89.29 (ICD-10-CM) - Chronic bilateral low back pain with bilateral sciatica M51.362 (ICD-10-CM) - Degeneration of intervertebral disc of lumbar region with discogenic back pain and lower extremity pain  Rationale for Evaluation and Treatment: Rehabilitation  THERAPY DIAG:  Other abnormalities of gait and mobility  Other low back pain  Difficulty in walking, not elsewhere classified  Muscle weakness (generalized)  Balance disorder  ONSET DATE: February 2024 first visit to Dr Leonce  SUBJECTIVE:                                                                                                                                                                                           SUBJECTIVE STATEMENT: I'm hurting all over.    PERTINENT HISTORY:  [name] (next upcoming trip 7/14 for a [place] cruise) OA, trigger fingers, right hip arthroplasty, pacemaker CVA 30 years ago affecting L side  PAIN:  07/27/24 Are you having pain? Yes: NPRS scale: 2/10 Pain location: low back Pain description: discomfort Aggravating factors: worst first thing in the morning or after lying on his couch, swimming Relieving factors: medication,  movement  PRECAUTIONS: ICD/Pacemaker  RED FLAGS: None   WEIGHT BEARING RESTRICTIONS:  No  FALLS:  Has patient fallen in last 6 months? Yes. Number of falls 1 fall.  Tripped over a chair while traveling at [place]  LIVING ENVIRONMENT: Lives with: lives with their spouse Lives in: House/apartment Stairs: one story Has following equipment at home: None  OCCUPATION: Retired  PLOF: Independent and Leisure: traveling (many upcoming trips planned), swimming  PATIENT GOALS: To feel better and not have to be reliant on pain medication as much and improve balance and flexibility.  NEXT MD VISIT: Follow up with Dr Leonce as needed  OBJECTIVE:  Note: Objective measures were completed at Evaluation unless otherwise noted.  DIAGNOSTIC FINDINGS:  Lumbar CT Scan on 02/04/2023: IMPRESSION: Multilevel degenerative changes which are most severe at the L3-4 level with significant spinal stenosis. No acute traumatic abnormalities identified.  PATIENT SURVEYS:  Modified Oswestry:  MODIFIED OSWESTRY DISABILITY SCALE   Date: 05/26/2024 Score  Pain intensity 2 =  Pain medication provides me with complete relief from pain.  2. Personal care (washing, dressing, etc.) 0 =  I can take care of myself normally without causing increased pain.  3. Lifting 2 = Pain prevents me from lifting heavy weights off the floor, activities (eg. sports, dancing). but I can manage if the weights are conveniently positioned (3) Pain prevents me from going out very often. (eg, on a table).  4. Walking 0 = Pain does not prevent me from walking any distance  5. Sitting 0 =  I can sit in any chair as long as I like.  6. Standing 1 =  I can stand as long as I want but, it increases my pain.  7. Sleeping 1 = I can sleep well only by using pain medication.  8. Social Life 0 = My social life is normal and does not increase my pain.  9. Traveling 2 =  My pain restricts my travel over 2 hours.  10. Employment/ Homemaking 1 = My  normal homemaking/job activities increase my pain, but I can still perform all that is required of me  Total 9 / 50 = 18.0 %   Interpretation of scores: Score Category Description  0-20% Minimal Disability The patient can cope with most living activities. Usually no treatment is indicated apart from advice on lifting, sitting and exercise  21-40% Moderate Disability The patient experiences more pain and difficulty with sitting, lifting and standing. Travel and social life are more difficult and they may be disabled from work. Personal care, sexual activity and sleeping are not grossly affected, and the patient can usually be managed by conservative means  41-60% Severe Disability Pain remains the main problem in this group, but activities of daily living are affected. These patients require a detailed investigation  61-80% Crippled Back pain impinges on all aspects of the patient's life. Positive intervention is required  81-100% Bed-bound  These patients are either bed-bound or exaggerating their symptoms  Bluford FORBES Zoe DELENA Karon DELENA, et al. Surgery versus conservative management of stable thoracolumbar fracture: the PRESTO feasibility RCT. [place] ([place]): [employer]; 2021 Nov. [facility] [employer] Technology Assessment, No. 25.62.) Appendix 3, Oswestry Disability Index category descriptors. Available from: FindJewel[employer].cz  Minimally Clinically Important Difference (MCID) = 12.8%   8/6/2 ODI 4/50 = 8%  COGNITION: Overall cognitive status: Within functional limits for tasks assessed     SENSATION: Patient states that he has a neuropathy problem in his feet, but it has improved since starting Gabapentin   MUSCLE LENGTH: Hamstrings: increased tightness bilaterally Piriformis:  tightness bilat  POSTURE: rounded shoulders and forward head   LUMBAR ROM:   Eval:  Decreased by approx 50%  LOWER EXTREMITY ROM:     WFL  LOWER EXTREMITY MMT:    Eval:    LE grossly WNL, but grossly 4+ to 5-/5 in right hip Core strength grossly 4-/5 throughout  LUMBAR SPECIAL TESTS:  Slump test: Negative  FUNCTIONAL TESTS:  Eval: 5 times sit to stand: 11.87 sec Single Leg Stance:  right- 1.53 sec, left- 1.40 sec  09/04/24  SLS R 4 sec; L 5 sec 09/13/24 SLS R 4 sec; L 6 sec  GAIT: Distance walked: >500 ft Assistive device utilized: None Level of assistance: Complete Independence Comments: States that he has more difficulty with balance on unlevel surfaces (like grass) than firm surfaces.     MINI-BESTest: Balance Evaluation Systems Test ANTICIPATORY: SIT TO STAND: (2) normal without use of hands and stabilizes independently     (1) Moderate comes to stand WITH use of hands on 1st attempt    (0) Severe: unable to stand up from chair without assistance OR needs several attempts with use of   hands Score:  2 07/19/24, 2 09/15/24  2. RISE TO TOES: feet shoulder width apart. Hands on hips.  Rise as high as you can onto your toes. Try to hold this pose for 3 sec                          (2) stable for 3s with maximum height    (1) heels up, but not full range (smaller than when holding hands) OR noticeable instability for 3 sec                           (0) < or equat to 3 s  Score:  1 07/19/24, 2 09/15/24  3. STAND ON ONE LEG: look straight ahead. Hands on hips. Lift your leg off the ground.     Left:  trial 1 __3__ trial 2__6_                                    Right: Trial 1:___1   Trial 2:____3___   (2) 20 s       (2)  20 s                (1) < 20      (1) < 20 s   (0) Unable      (0) unable Score (use the worst side):  1 07/19/24; 1 09/15/24  REACTIVE POSTURAL CONTROL 4. COMPENSATORY STEPPING CORRECTION FORWARD stand with feet shoulder width apart, arms at your sides. Lean forward against my hands beyond your forward limits.  When I let go, do whatever is necessary, including taking a step, to avoid a fall   (2) recovers independently  with a  single, large step, to avoid a fall   (1) more than one step used to recover equilibrium   (0) No step OR would fall if not caught Score:  0 07/19/24; 2 10/17  5. COMPENSATORY STEPPING CORRECTION BACKWARD: Lean backward against my hands beyond your backward limits.  When I let go, do whatever is necessary, including a step to avoid a fall. (2) recovers independently  with a single, large step, to avoid a fall   (1) more than one step used to  recover equilibrium   (0) No step OR would fall if not caught Score:  0  07/19/24, 1 09/15/24  6. COMPENSATORY STEPPING LATERAL: Lean into my hand beyond your sideways limit.  When I let go, do whatever is necessary, including taking a step, to avoid a fall. Left           Right   (2) recovers independently with 1 step (crossover or lateral OK)   (1) several steps to recover equilibrium   (0) falls or cannot step Score:     0  (use the side with the lowest score) at eval;  L  and R 2  09/15/24  SENSORY ORIENTATION 7. STANCE FEET TOGETHER EYES OPEN  firm surface Be as stable and still as possible until I say stop   (2) 30s   (1) < 30 s   (0) unable Score:  2 07/19/24 and 09/15/24  8. STANCE ON FOAM EYES CLOSED feet together, eyes closed, hands on hips. Be as stable and still as you can until I stay stop.  I will start timing when you close your eyes. Time in seconds:      (2) 30s   (1) <30 s   (0) unable Score:  1 at eval and 09/15/24  9. INCLINE EYES CLOSED: Stand on incline with feet shoulder width apart, arms down at your sides.  I will start timing when you close your eyes   (2) Stands 30 s and aligns with gravity   (1) stands < 30 s OR aligns with surface   (0) unable Score:  1 07/19/24 and 09/15/24  DYNAMIC GAIT 10. CHANGE IN GAIT SPEED: begin walking at your normal speed, when I tell you fast, walk as fast as you can, when I say slow walk very slowly   (2) significantly changes walking speed without imbalance   (1) unable to change  walking speed or signs of imbalance   (0) unable to achieve significant change in walking speed AND signes of imbalance Score: 2 07/19/24 and 09/15/24  11. WALK WITH HEAD TURNS HORIZONTAL: begin walking at normal speed, when I say right, turn your head to the right, when I say left turn your head to the left.  Try to keep yourself walking in a straight line   (2) performs head turns with no change in gait speed and good balance   (1) performs head turns with reduction in gait speed   (0)  performs head turns with imbalance Score:  2 07/19/24 0 09/15/24  12. WALK WITH PIVOT TURNS: begin walking at your normal speed.  When I tell you to turn and stop, turn as quickly as you can, face the opposite direction and stop.  After the turn, your feet should be close together   (2) turns with feet close FAST in 3 steps with good balance   (1) turns with feet close SLOW > 4 steps with good balance   (0) cannot turn with feet close at any speed without imbalance Score:2 07/19/24 and 10/17  13. STEP OVER OBSTACLES:  ( 9 inch height 10 feet away from subject) begin walking at your normal speed.  When you get to the box, step over it, not around it and keep walking   (2) Able to step over box with minimal change of gait speed and good balance   (1) Steps over box but touches box OR displays cautious behavior by slowing gait   (0) Unable to step over box  OR steps around box Score:  2 07/19/24 and 10/17  14. TIMED UP AND GO NORMAL AND WITH DUAL TASK: 3 METER WALK: When I say go walk at your your normal speed across the tape, turn around and come back to sit in the chair   Time: Count backwards by 3s starting at    20.  When I say go, stand up from the chair, walk at your normal speed across the tape on the floor, turn around, come back to sit in the chair.  Continue counting backwards the entire time.  Time:   (2) No noticeable change in sitting, standing or walking while backward counting when compared to TUG  without dual task   (1) Dual task affects either counting OR walking (>10%)    (0) stops counting while walking OR stops walking while counting If gait speed slows more than 10% between TUG without and with dual task, the score should be decreased by a point Score: 1 8/20/252 10/17   Total score:       17    /28  on 07/19/24    20/28 on 10 /17/ 25      TREATMENT DATE:  09/18/24(SBA/CGA at all times) Step taps to steps 1-2-3 steps at stair holding 10# KB B Reach behinds to chair with opposite hand x 10 ea Reach behinds to counter in different stance positions Hurdles with 3 sec knee raises fwd multiple reps  Sit to standsx 5 Step ups 20# KB onto aerobic step + 1 riser x 10 B Running man's focusing on form including arms and prolonged hold on one leg Worked on short foot in standing to avoid rolling to lateral foot Divers to chair with one finger support x 5 ea Resisted walking fwd/bwd 20#  5 ea   09/16/24(SBA/CGA at all times) Mini best test completed Reach behinds to counter in different stance positions Bouncing lacrosse ball weaving in/out of cones - no problem Step ups 20# KB onto aerobic step + 1 riser x 10 B   09/13/24(SBA/CGA at all times) Step taps to steps 1-2-3 steps at stair holding 10# KB B Running man's focusing on form including arms and prolonged hold on one leg Stepping strategies lateral and backward Decline wall squat 2x10 Hurdles with 3 sec knee raises fwd multiple reps  Divers with one finger support x 5 ea Three way taps to small cones x 5 B Braiding forward and backward crosses    09/06/24(SBA/CGA at all times) Step taps to steps 1-2-3 steps at stair holding 10# KB B Standing bird dog as warm up for running mans Running man's focusing on form including arms and prolonged hold on one leg Hurdles fwd and lateral with 3 sec knee raises fwd multiple reps - very difficult Divers with one finger support x 5 ea Single leg cone taps on mat table with  kickstand B; then without kickstand Diver forward x 5 B reaching for mat Leg Press (seat 7) 105# 2 x 10 bilateral ; 70# unilateral 2 x 10 Cross body taps x 10 B Seated piriformis stretch x 30 sec  Reverse taps x 10 ea Agility ladder - backwards biased   09/04/24 Step taps to steps 1-2-3 steps at stair holding 10# KB B Hurdles with 3 sec knee raises fwd multiple reps - very difficult Running man's focusing on form including arms and prolonged hold on one leg Divers with one finger support x 5 ea Diver forward x 5 B reaching for  counter Leg Press (seat 7) 105# 2 x 10 bilateral ; 70# unilateral 2 x 10 Unilateral 20# farmer's carry x 2 laps down long hallways Walking outside on grass to assess ability and form including declines Wall sit on decline board to fatigue  PATIENT EDUCATION:  Education details: Issued HEP Person educated: Patient Education method: Explanation, Demonstration, and Handouts Education comprehension: verbalized understanding and returned demonstration  HOME EXERCISE PROGRAM: Access Code: [facility] URL: [email or web address] Date: 07/25/2024 Prepared by: Mliss  Exercises - Seated Transversus Abdominis Bracing  - 1 x daily - 7 x weekly - 2 sets - 10 reps - Seated Hamstring Stretch  - 1 x daily - 7 x weekly - 2 reps - 20 sec hold - Seated Piriformis Stretch  - 1 x daily - 7 x weekly - 2 reps - 20 sec hold - Single Leg Stance with Support  - 1 x daily - 7 x weekly - 2 reps - 20 sec hold - Shoulder External Rotation and Scapular Retraction with Resistance  - 1 x daily - 7 x weekly - 2 sets - 10 reps - Standing Shoulder Horizontal Abduction with Resistance  - 1 x daily - 7 x weekly - 2 sets - 10 reps - Standing Shoulder Row with Anchored Resistance  - 1 x daily - 7 x weekly - 2 sets - 10 reps - Shoulder extension with resistance - Neutral  - 1 x daily - 7 x weekly - 2 sets - 10 reps - Single Leg Balance with Clock Reach  - 1 x daily - 7 x weekly - 10  reps - Standing 'L' Stretch at Counter  - 1 x daily - 7 x weekly - 2 reps - 20 sec hold - Resisted Sit-to-Stand With Dumbbell at Chest  - 1 x daily - 3 x weekly - 2 sets - 10 reps - Side Step Overs with Cones and Unilateral Counter Support  - 1 x daily - 3 x weekly - 2 sets - 10 reps - Standing 3-Way Leg Reach with Resistance at Ankles and Counter Support  - 1 x daily - 3 x weekly - 2 sets - 10 reps - Side Stepping with Resistance at Ankles  - 1 x daily - 3 x weekly - 2 sets - 10 reps - Forward Monster Walk with Resistance at Ankles and Counter Support  - 1 x daily - 3 x weekly - 2 sets - 10 reps - Squat with Chair Touch  - 1 x daily - 3 x weekly - 2 sets - 10 reps - Kneeling Plantarflexion Stretch  - 1 x daily - 3 x weekly - 1 sets - 3 reps - 30 sec hold - Supine Ankle Dorsiflexion and Plantarflexion AROM  - 1 x daily - 7 x weekly - 2 sets - 10 reps - Supine Ankle Inversion and Eversion AROM  - 1 x daily - 7 x weekly - 2 sets - 10 reps - Supine Ankle Circles  - 1 x daily - 7 x weekly - 2 sets - 10 reps - Seated Ankle Alphabet  - 1 x daily - 7 x weekly - 1 sets - 1 reps  ASSESSMENT:  CLINICAL IMPRESSION: Patient reporting increased overall stiffness today and is unsure whether it's all the exercising he is doing or the fact that he went off one of his back pain medicines. He demonstrates slow and antalgic looking movements initially, but with repeated sit to stands and floor squats mobility improves. This  is true with rotational movements as well. We continue to work on increased single leg stance time which remains challenging.     OBJECTIVE IMPAIRMENTS: decreased balance, decreased strength, increased muscle spasms, impaired flexibility, postural dysfunction, and pain.   ACTIVITY LIMITATIONS: lifting, standing, and stairs  PARTICIPATION LIMITATIONS: driving, community activity, and yard work  PERSONAL FACTORS: Time since onset of injury/illness/exacerbation and 1-2 comorbidities: OA,  right hip arthroplasty are also affecting patient's functional outcome.   REHAB POTENTIAL: Good  CLINICAL DECISION MAKING: Stable/uncomplicated  EVALUATION COMPLEXITY: Low   GOALS: Goals reviewed with patient? Yes  SHORT TERM GOALS: Target date: 06/16/2024  Patient will be independent with initial HEP. Baseline: Goal status: Met on 05/31/24  2.  Patient will demonstrate and verbalize knowledge of proper lifting techniques to allow him to lift suitcases for travel. Baseline:  Goal status: MET   LONG TERM GOALS: Target date: 10/11/2024    Patient will be independent with advanced HEP to allow for self progression after discharge. Baseline:  Goal status: IN PROGRESS 10/17  2.  Patient will improve modified Oswestry to not greater than 5% to demonstrate improved functional mobility. Baseline: 18% Goal status: IN PROGRESS 07/05/24  8%   3.  Patient will increase right hip strength to [facility] to allow him to navigate stairs with reciprocal pattern without difficulty/discomfort. Baseline:  Goal status: MET 07/05/24  4.  Patient will increase bilateral single leg stance to greater than 5 seconds to decrease patients fall risk. Baseline: right- 1.53 sec, left- 1.40 sec Goal status: IN PROGRESS 09/15/24 MODIFIED TO 5 SECONDS  5.  Patient will report ability to ambulate over various unlevel surfaces for desired length (at least greater than 30 minutes) without a loss of balance to allow patient to travel and sight-see. Baseline:  Goal status: IN PROGRESS 10/17  6 Patient will improve dynamic balance by using an ankle strategy in order to reduce loss of balance in the posterior direction Baseline:  Goal status: IN PROGRESS 10/17  7. Improved mini- best score to 23/28 indicating improved dynamic balance and decreased risk of falls  Baseline: 17/28 Goal status: IN PROGRESS 10/17  20/28  PLAN:  PT FREQUENCY: 2x/week  PT DURATION: 12 weeks  (pt will be traveling for a few weeks in  September)  PLANNED INTERVENTIONS: 02835- PT Re-evaluation, 97750- Physical Performance Testing, 97110-Therapeutic exercises, 97530- Therapeutic activity, [id number]- Neuromuscular re-education, 97535- Self Care, 02859- Manual therapy, [id number]- Gait training, [phone]- Canalith repositioning, [id number]- Aquatic Therapy, [phone]- Electrical stimulation (unattended), [id number]- Electrical stimulation (manual), [id number]- Ultrasound, [id number]- Traction (mechanical), [id number]- Ionotophoresis 4mg /ml Dexamethasone , [id number] (1-2 muscles), [id number] (3+ muscles)- Dry Needling, Patient/Family education, Balance training, Stair training, Taping, Joint mobilization, Joint manipulation, Spinal manipulation, Spinal mobilization, Cryotherapy, and Moist heat.  PLAN FOR NEXT SESSION: continue with prolonged and dynamic SLS, agility ladder with bwd activities, and crossing midline activities, eccentric quad strengthening  Mliss Cummins, PT  09/18/24 1:29 PM [facility] [facility] [address] [place], [place] [address] Phone # [phone] [phone]

## 2024-09-18 ENCOUNTER — Encounter: Payer: Self-pay | Admitting: Physical Therapy

## 2024-09-18 ENCOUNTER — Ambulatory Visit: Admitting: Physical Therapy

## 2024-09-18 DIAGNOSIS — R293 Abnormal posture: Secondary | ICD-10-CM | POA: Diagnosis not present

## 2024-09-18 DIAGNOSIS — R262 Difficulty in walking, not elsewhere classified: Secondary | ICD-10-CM

## 2024-09-18 DIAGNOSIS — R2689 Other abnormalities of gait and mobility: Secondary | ICD-10-CM

## 2024-09-18 DIAGNOSIS — M5459 Other low back pain: Secondary | ICD-10-CM

## 2024-09-18 DIAGNOSIS — M6281 Muscle weakness (generalized): Secondary | ICD-10-CM | POA: Diagnosis not present

## 2024-09-18 DIAGNOSIS — R252 Cramp and spasm: Secondary | ICD-10-CM | POA: Diagnosis not present

## 2024-09-19 NOTE — Therapy (Signed)
 OUTPATIENT PHYSICAL THERAPY TREATMENT     Patient Name: [name] MRN: [id number] DOB:1946/11/12, 78 y.o., male Today's Date: 09/20/2024  END OF SESSION:  PT End of Session - 09/20/24 1705     Visit Number 20    Date for Recertification  10/11/24    Authorization Type [employer] MCR - re-auth submitted 10/20 to 12/8    Authorization Time Period 07/20/24 to 10/11/24    Authorization - Visit Number 11    Authorization - Number of Visits 18    Progress Note Due on Visit 28    PT Start Time 1620    PT Stop Time 1700    PT Time Calculation (min) 40 min    Activity Tolerance Patient tolerated treatment well    Behavior During Therapy [facility] for tasks assessed/performed                        Past Medical History:  Diagnosis Date   Abscess    right posterior neck   Anticoagulation adequate 06/07/2019   Overview:  SSS, paroxAFib/Flutter, CHADSvasc= 3 age, h/o cva; chronic OAC Xarelto    Atrial fibrillation and flutter (HCC) 01/01/2015   Noted 2016. Xarelto     BPH associated with nocturia 08/14/2014   Finasteride , flomax .  0-3x nocturia. PSA to 14-->eval urology [facility]. PSA trended back down to about 2.6 per records in 2015 then placed on finasteride - had significant voiding issues when spiked to 14- improved on meds    Bradycardia    CAD (coronary artery disease) 05/26/2016   Coronary CTA_ 50-75% D1 off CT report    Cardiac pacemaker in situ 12/28/2009   SA node dysfunction as cause    Chest pressure 12/01/2015   Overview:  atypical CP, [facility] nuclear perfusion imaging w/ no inarct, no ischemia, EF-40% NICM   Chronic systolic heart failure (HCC) 12/01/2015   Overview:  [facility] nuclear perfusion imaging study, no inarct, no ischemia, EF-40% NICM   CVA (cerebral vascular accident) (HCC)    Double vision 07/04/2018   Elevated LFTs    Erectile dysfunction 01/30/2015   cialis  prn    External hemorrhoids    Fitting or adjustment of cardiac pacemaker 06/07/2019   Overview:  [place]  [place] DR [id number], RA and RV leads [id number]:  [id number] and [id number]   Former smoker 01/30/2015   Former smoker. 20 pack years quit 92. Patient declined AAA screen.     GERD (gastroesophageal reflux disease)    Gout 11/11/2009   Qualifier: Diagnosis of  By: Gardenia Pao     History of nuclear stress test 06/07/2019   Overview:  [facility], no inarct, no ischemia, EF-40% NICM   HTN (hypertension)    Hyperglycemia 06/10/2017   110s CBG 2018   Hyperlipidemia 07/12/2007   Crestor  20mg  daily with some myalgias; fish oil. Trig 200-500 despite this. LDL ok.        Insomnia 01/30/2015   ambien  5mg  prn. May refill.     Ischemic optic neuropathy    Left chest pressure 03/19/2015   Other and unspecified hyperlipidemia    Sinoatrial node dysfunction (HCC)    Spinal stenosis    Trigger finger, right middle finger 03/01/2018   Past Surgical History:  Procedure Laterality Date   ABSCESS DRAINAGE     righ tposterior neck   CARDIAC CATHETERIZATION  1996   CHOLECYSTECTOMY  02/07/2003   COLONOSCOPY  06/23/2004   EYE SURGERY     left eye   left ingunial hernia  06/08/2011   PACEMAKER INSERTION     PPM GENERATOR CHANGEOUT N/A 07/12/2024   Procedure: PPM GENERATOR CHANGEOUT;  Surgeon: Nathan Danelle ORN, MD;  Location: [facility] INVASIVE CV LAB;  Service: Cardiovascular;  Laterality: N/A;   right hip replacement     2012 Dr. Hiram   Patient Active Problem List   Diagnosis Date Noted   Aortic atherosclerosis 08/01/2020   Fatty liver 08/01/2020   Paroxysmal atrial fibrillation (HCC) 07/27/2019   Anticoagulation adequate 06/07/2019   Convergence insufficiency 06/07/2019   Fitting or adjustment of cardiac pacemaker 06/07/2019   Pacemaker-dependent due to native cardiac rhythm insufficient to support life 06/07/2019   Palpitations 06/07/2019   History of nuclear stress test 06/07/2019   S/P cardiac cath 06/07/2019   Non-occlusive coronary artery disease 06/07/2019   Double vision 07/04/2018   Trigger finger,  right middle finger 03/01/2018   Hyperglycemia 06/10/2017   Solitary pulmonary nodule 05/26/2016   CAD (coronary artery disease) 05/26/2016   Nonischemic cardiomyopathy (HCC) 04/23/2016   Chronic systolic heart failure (HCC) 12/01/2015   Chest pressure 12/01/2015   Left chest pressure 03/19/2015   Insomnia 01/30/2015   Erectile dysfunction 01/30/2015   Cerebral infarction (HCC) 01/30/2015   Former smoker 01/30/2015   Atrial fibrillation and flutter (HCC) 01/01/2015   Allergic rhinitis 10/11/2014   BPH associated with nocturia 08/14/2014   Ventricular tachycardia, non-sustained (HCC) 01/25/2013   Sinoatrial node dysfunction (HCC)    SPINAL STENOSIS, LUMBAR 08/22/2010   GERD 03/17/2010   Cardiac pacemaker in situ 12/28/2009   Gout 11/11/2009   Hyperlipidemia 07/12/2007   NEUROPATHY, ISCHEMIC OPTIC 06/02/2007   Essential hypertension 06/02/2007    PCP: [name]., MD  REFERRING PROVIDER: [name], DO  REFERRING DIAG: M54.42,M54.41,G89.29 (ICD-10-CM) - Chronic bilateral low back pain with bilateral sciatica M51.362 (ICD-10-CM) - Degeneration of intervertebral disc of lumbar region with discogenic back pain and lower extremity pain  Rationale for Evaluation and Treatment: Rehabilitation  THERAPY DIAG:  Other abnormalities of gait and mobility  Other low back pain  Difficulty in walking, not elsewhere classified  Muscle weakness (generalized)  Balance disorder  ONSET DATE: February 2024 first visit to Dr Nathan  SUBJECTIVE:                                                                                                                                                                                           SUBJECTIVE STATEMENT: My balance is bad. And I didn't sleep well.  I'm sore.   PERTINENT HISTORY:  [name] frequently (next upcoming trip 7/14 for a [place] cruise) OA, trigger fingers, right hip arthroplasty, pacemaker CVA 30 years  ago affecting L  side  PAIN:  07/27/24 Are you having pain? Yes: NPRS scale: 2/10 Pain location: low back Pain description: discomfort Aggravating factors: worst first thing in the morning or after lying on his couch, swimming Relieving factors: medication, movement  PRECAUTIONS: ICD/Pacemaker  RED FLAGS: None   WEIGHT BEARING RESTRICTIONS: No  FALLS:  Has patient fallen in last 6 months? Yes. Number of falls 1 fall.  Tripped over a chair while traveling at [place]  LIVING ENVIRONMENT: Lives with: lives with their spouse Lives in: House/apartment Stairs: one story Has following equipment at home: None  OCCUPATION: Retired  PLOF: Independent and Leisure: traveling (many upcoming trips planned), swimming  PATIENT GOALS: To feel better and not have to be reliant on pain medication as much and improve balance and flexibility.  NEXT MD VISIT: Follow up with Dr Nathan as needed  OBJECTIVE:  Note: Objective measures were completed at Evaluation unless otherwise noted.  DIAGNOSTIC FINDINGS:  Lumbar CT Scan on 02/04/2023: IMPRESSION: Multilevel degenerative changes which are most severe at the L3-4 level with significant spinal stenosis. No acute traumatic abnormalities identified.  PATIENT SURVEYS:  Modified Oswestry:  MODIFIED OSWESTRY DISABILITY SCALE   Date: 05/26/2024 Score  Pain intensity 2 =  Pain medication provides me with complete relief from pain.  2. Personal care (washing, dressing, etc.) 0 =  I can take care of myself normally without causing increased pain.  3. Lifting 2 = Pain prevents me from lifting heavy weights off the floor, activities (eg. sports, dancing). but I can manage if the weights are conveniently positioned (3) Pain prevents me from going out very often. (eg, on a table).  4. Walking 0 = Pain does not prevent me from walking any distance  5. Sitting 0 =  I can sit in any chair as long as I like.  6. Standing 1 =  I can stand as long as I want but, it increases my  pain.  7. Sleeping 1 = I can sleep well only by using pain medication.  8. Social Life 0 = My social life is normal and does not increase my pain.  9. Traveling 2 =  My pain restricts my travel over 2 hours.  10. Employment/ Homemaking 1 = My normal homemaking/job activities increase my pain, but I can still perform all that is required of me  Total 9 / 50 = 18.0 %   Interpretation of scores: Score Category Description  0-20% Minimal Disability The patient can cope with most living activities. Usually no treatment is indicated apart from advice on lifting, sitting and exercise  21-40% Moderate Disability The patient experiences more pain and difficulty with sitting, lifting and standing. Travel and social life are more difficult and they may be disabled from work. Personal care, sexual activity and sleeping are not grossly affected, and the patient can usually be managed by conservative means  41-60% Severe Disability Pain remains the main problem in this group, but activities of daily living are affected. These patients require a detailed investigation  61-80% Crippled Back pain impinges on all aspects of the patient's life. Positive intervention is required  81-100% Bed-bound  These patients are either bed-bound or exaggerating their symptoms  Bluford FORBES Zoe DELENA Karon DELENA, et al. Surgery versus conservative management of stable thoracolumbar fracture: the PRESTO feasibility RCT. [place]): [employer]; 2021 Nov. [facility] Technology Assessment, No. 25.62.) Appendix 3, Oswestry Disability Index category descriptors. Available from: FindJewel[employer].cz  Minimally Clinically Important Difference (  MCID) = 12.8%   8/6/2 ODI 4/50 = 8%  COGNITION: Overall cognitive status: Within functional limits for tasks assessed     SENSATION: Patient states that he has a neuropathy problem in his feet, but it has improved since starting Gabapentin   MUSCLE  LENGTH: Hamstrings: increased tightness bilaterally Piriformis:  tightness bilat  POSTURE: rounded shoulders and forward head   LUMBAR ROM:   Eval:  Decreased by approx 50%  LOWER EXTREMITY ROM:     WFL  LOWER EXTREMITY MMT:    Eval:   LE grossly WNL, but grossly 4+ to 5-/5 in right hip Core strength grossly 4-/5 throughout  LUMBAR SPECIAL TESTS:  Slump test: Negative  FUNCTIONAL TESTS:  Eval: 5 times sit to stand: 11.87 sec Single Leg Stance:  right- 1.53 sec, left- 1.40 sec  09/04/24  SLS R 4 sec; L 5 sec 09/13/24 SLS R 4 sec; L 6 sec  GAIT: Distance walked: >500 ft Assistive device utilized: None Level of assistance: Complete Independence Comments: States that he has more difficulty with balance on unlevel surfaces (like grass) than firm surfaces.     MINI-BESTest: Balance Evaluation Systems Test ANTICIPATORY: SIT TO STAND: (2) normal without use of hands and stabilizes independently     (1) Moderate comes to stand WITH use of hands on 1st attempt    (0) Severe: unable to stand up from chair without assistance OR needs several attempts with use of   hands Score:  2 07/19/24, 2 09/15/24  2. RISE TO TOES: feet shoulder width apart. Hands on hips.  Rise as high as you can onto your toes. Try to hold this pose for 3 sec                          (2) stable for 3s with maximum height    (1) heels up, but not full range (smaller than when holding hands) OR noticeable instability for 3 sec                           (0) < or equat to 3 s  Score:  1 07/19/24, 2 09/15/24  3. STAND ON ONE LEG: look straight ahead. Hands on hips. Lift your leg off the ground.     Left:  trial 1 __3__ trial 2__6_                                    Right: Trial 1:___1   Trial 2:____3___   (2) 20 s       (2)  20 s                (1) < 20      (1) < 20 s   (0) Unable      (0) unable Score (use the worst side):  1 07/19/24; 1 09/15/24  REACTIVE POSTURAL CONTROL 4. COMPENSATORY STEPPING  CORRECTION FORWARD stand with feet shoulder width apart, arms at your sides. Lean forward against my hands beyond your forward limits.  When I let go, do whatever is necessary, including taking a step, to avoid a fall   (2) recovers independently  with a single, large step, to avoid a fall   (1) more than one step used to recover equilibrium   (0) No step OR would fall if not caught Score:  0  07/19/24; 2 10/17  5. COMPENSATORY STEPPING CORRECTION BACKWARD: Lean backward against my hands beyond your backward limits.  When I let go, do whatever is necessary, including a step to avoid a fall. (2) recovers independently  with a single, large step, to avoid a fall   (1) more than one step used to recover equilibrium   (0) No step OR would fall if not caught Score:  0  07/19/24, 1 09/15/24  6. COMPENSATORY STEPPING LATERAL: Lean into my hand beyond your sideways limit.  When I let go, do whatever is necessary, including taking a step, to avoid a fall. Left           Right   (2) recovers independently with 1 step (crossover or lateral OK)   (1) several steps to recover equilibrium   (0) falls or cannot step Score:     0  (use the side with the lowest score) at eval;  L  and R 2  09/15/24  SENSORY ORIENTATION 7. STANCE FEET TOGETHER EYES OPEN  firm surface Be as stable and still as possible until I say stop   (2) 30s   (1) < 30 s   (0) unable Score:  2 07/19/24 and 09/15/24  8. STANCE ON FOAM EYES CLOSED feet together, eyes closed, hands on hips. Be as stable and still as you can until I stay stop.  I will start timing when you close your eyes. Time in seconds:      (2) 30s   (1) <30 s   (0) unable Score:  1 at eval and 09/15/24  9. INCLINE EYES CLOSED: Stand on incline with feet shoulder width apart, arms down at your sides.  I will start timing when you close your eyes   (2) Stands 30 s and aligns with gravity   (1) stands < 30 s OR aligns with surface   (0) unable Score:  1 07/19/24 and  09/15/24  DYNAMIC GAIT 10. CHANGE IN GAIT SPEED: begin walking at your normal speed, when I tell you fast, walk as fast as you can, when I say slow walk very slowly   (2) significantly changes walking speed without imbalance   (1) unable to change walking speed or signs of imbalance   (0) unable to achieve significant change in walking speed AND signes of imbalance Score: 2 07/19/24 and 09/15/24  11. WALK WITH HEAD TURNS HORIZONTAL: begin walking at normal speed, when I say right, turn your head to the right, when I say left turn your head to the left.  Try to keep yourself walking in a straight line   (2) performs head turns with no change in gait speed and good balance   (1) performs head turns with reduction in gait speed   (0)  performs head turns with imbalance Score:  2 07/19/24 0 09/15/24  12. WALK WITH PIVOT TURNS: begin walking at your normal speed.  When I tell you to turn and stop, turn as quickly as you can, face the opposite direction and stop.  After the turn, your feet should be close together   (2) turns with feet close FAST in 3 steps with good balance   (1) turns with feet close SLOW > 4 steps with good balance   (0) cannot turn with feet close at any speed without imbalance Score:2 07/19/24 and 10/17  13. STEP OVER OBSTACLES:  ( 9 inch height 10 feet away from subject) begin walking at your normal speed.  When you get to the box, step over it, not around it and keep walking   (2) Able to step over box with minimal change of gait speed and good balance   (1) Steps over box but touches box OR displays cautious behavior by slowing gait   (0) Unable to step over box OR steps around box Score:  2 07/19/24 and 10/17  14. TIMED UP AND GO NORMAL AND WITH DUAL TASK: 3 METER WALK: When I say go walk at your your normal speed across the tape, turn around and come back to sit in the chair   Time: Count backwards by 3s starting at    20.  When I say go, stand up from the chair,  walk at your normal speed across the tape on the floor, turn around, come back to sit in the chair.  Continue counting backwards the entire time.  Time:   (2) No noticeable change in sitting, standing or walking while backward counting when compared to TUG without dual task   (1) Dual task affects either counting OR walking (>10%)    (0) stops counting while walking OR stops walking while counting If gait speed slows more than 10% between TUG without and with dual task, the score should be decreased by a point Score: 1 8/20/252 10/17   Total score:       17    /28  on 07/19/24    20/28 on 10 /17/ 25      TREATMENT DATE:  09/20/24(SBA/CGA at all times) Aura carry unilaterally 15# KB around cancer gym Aura carry 8# overhead x 2 hallway B 30# carry x 1 hall way B Step back lunges x 10 Lateral lunges x 10  Heel/toe walking  4 laps at barre ea Reach behinds to counter in different stance positions Hurdles with 3 sec knee raises fwd multiple reps  Sit to stands x 5, 3 with narrow base, 8# x 8 Toe taps fwd and back crossing midline - max 2  Step ups 20# KB onto aerobic step + 1 riser x 10 B   09/18/24(SBA/CGA at all times) Step taps to steps 1-2-3 steps at stair holding 10# KB B Reach behinds to chair with opposite hand x 10 ea Reach behinds to counter in different stance positions Hurdles with 3 sec knee raises fwd multiple reps  Sit to standsx 5 Step ups 20# KB onto aerobic step + 1 riser x 10 B Running man's focusing on form including arms and prolonged hold on one leg Worked on short foot in standing to avoid rolling to lateral foot Divers to chair with one finger support x 5 ea Resisted walking fwd/bwd 20#  5 ea   09/16/24(SBA/CGA at all times) Mini best test completed Reach behinds to counter in different stance positions Bouncing lacrosse ball weaving in/out of cones - no problem Step ups 20# KB onto aerobic step + 1 riser x 10 B   09/13/24(SBA/CGA at all  times) Step taps to steps 1-2-3 steps at stair holding 10# KB B Running man's focusing on form including arms and prolonged hold on one leg Stepping strategies lateral and backward Decline wall squat 2x10 Hurdles with 3 sec knee raises fwd multiple reps  Divers with one finger support x 5 ea Three way taps to small cones x 5 B Braiding forward and backward crosses    09/06/24(SBA/CGA at all times) Step taps to steps 1-2-3 steps at stair holding 10# KB B Standing bird  dog as warm up for running mans Running man's focusing on form including arms and prolonged hold on one leg Hurdles fwd and lateral with 3 sec knee raises fwd multiple reps - very difficult Divers with one finger support x 5 ea Single leg cone taps on mat table with kickstand B; then without kickstand Diver forward x 5 B reaching for mat Leg Press (seat 7) 105# 2 x 10 bilateral ; 70# unilateral 2 x 10 Cross body taps x 10 B Seated piriformis stretch x 30 sec  Reverse taps x 10 ea Agility ladder - backwards biased   09/04/24 Step taps to steps 1-2-3 steps at stair holding 10# KB B Hurdles with 3 sec knee raises fwd multiple reps - very difficult Running man's focusing on form including arms and prolonged hold on one leg Divers with one finger support x 5 ea Diver forward x 5 B reaching for counter Leg Press (seat 7) 105# 2 x 10 bilateral ; 70# unilateral 2 x 10 Unilateral 20# farmer's carry x 2 laps down long hallways Walking outside on grass to assess ability and form including declines Wall sit on decline board to fatigue  PATIENT EDUCATION:  Education details: Issued HEP Person educated: Patient Education method: Explanation, Demonstration, and Handouts Education comprehension: verbalized understanding and returned demonstration  HOME EXERCISE PROGRAM: Access Code: [facility] URL: [email or web address] Date: 07/25/2024 Prepared by: Mliss  Exercises - Seated Transversus Abdominis Bracing  - 1  x daily - 7 x weekly - 2 sets - 10 reps - Seated Hamstring Stretch  - 1 x daily - 7 x weekly - 2 reps - 20 sec hold - Seated Piriformis Stretch  - 1 x daily - 7 x weekly - 2 reps - 20 sec hold - Single Leg Stance with Support  - 1 x daily - 7 x weekly - 2 reps - 20 sec hold - Shoulder External Rotation and Scapular Retraction with Resistance  - 1 x daily - 7 x weekly - 2 sets - 10 reps - Standing Shoulder Horizontal Abduction with Resistance  - 1 x daily - 7 x weekly - 2 sets - 10 reps - Standing Shoulder Row with Anchored Resistance  - 1 x daily - 7 x weekly - 2 sets - 10 reps - Shoulder extension with resistance - Neutral  - 1 x daily - 7 x weekly - 2 sets - 10 reps - Single Leg Balance with Clock Reach  - 1 x daily - 7 x weekly - 10 reps - Standing 'L' Stretch at Counter  - 1 x daily - 7 x weekly - 2 reps - 20 sec hold - Resisted Sit-to-Stand With Dumbbell at Chest  - 1 x daily - 3 x weekly - 2 sets - 10 reps - Side Step Overs with Cones and Unilateral Counter Support  - 1 x daily - 3 x weekly - 2 sets - 10 reps - Standing 3-Way Leg Reach with Resistance at Ankles and Counter Support  - 1 x daily - 3 x weekly - 2 sets - 10 reps - Side Stepping with Resistance at Ankles  - 1 x daily - 3 x weekly - 2 sets - 10 reps - Forward Monster Walk with Resistance at Ankles and Counter Support  - 1 x daily - 3 x weekly - 2 sets - 10 reps - Squat with Chair Touch  - 1 x daily - 3 x weekly - 2 sets - 10 reps - Kneeling Plantarflexion  Stretch  - 1 x daily - 3 x weekly - 1 sets - 3 reps - 30 sec hold - Supine Ankle Dorsiflexion and Plantarflexion AROM  - 1 x daily - 7 x weekly - 2 sets - 10 reps - Supine Ankle Inversion and Eversion AROM  - 1 x daily - 7 x weekly - 2 sets - 10 reps - Supine Ankle Circles  - 1 x daily - 7 x weekly - 2 sets - 10 reps - Seated Ankle Alphabet  - 1 x daily - 7 x weekly - 1 sets - 1 reps  ASSESSMENT:  CLINICAL IMPRESSION: Patient was challenged with lunges and heel walking. He  was able to progress farmer carries with heavier weight and holding weight OH. He stated his balance was bad today when he arrived, but did well with most of his exercises. He still requires SBA to CGA at times due to intermittent LOB.     OBJECTIVE IMPAIRMENTS: decreased balance, decreased strength, increased muscle spasms, impaired flexibility, postural dysfunction, and pain.   ACTIVITY LIMITATIONS: lifting, standing, and stairs  PARTICIPATION LIMITATIONS: driving, community activity, and yard work  PERSONAL FACTORS: Time since onset of injury/illness/exacerbation and 1-2 comorbidities: OA, right hip arthroplasty are also affecting patient's functional outcome.   REHAB POTENTIAL: Good  CLINICAL DECISION MAKING: Stable/uncomplicated  EVALUATION COMPLEXITY: Low   GOALS: Goals reviewed with patient? Yes  SHORT TERM GOALS: Target date: 06/16/2024  Patient will be independent with initial HEP. Baseline: Goal status: Met on 05/31/24  2.  Patient will demonstrate and verbalize knowledge of proper lifting techniques to allow him to lift suitcases for travel. Baseline:  Goal status: MET   LONG TERM GOALS: Target date: 10/11/2024    Patient will be independent with advanced HEP to allow for self progression after discharge. Baseline:  Goal status: IN PROGRESS 10/17  2.  Patient will improve modified Oswestry to not greater than 5% to demonstrate improved functional mobility. Baseline: 18% Goal status: IN PROGRESS 07/05/24  8%   3.  Patient will increase right hip strength to [facility] to allow him to navigate stairs with reciprocal pattern without difficulty/discomfort. Baseline:  Goal status: MET 07/05/24  4.  Patient will increase bilateral single leg stance to greater than 5 seconds to decrease patients fall risk. Baseline: right- 1.53 sec, left- 1.40 sec Goal status: IN PROGRESS 09/15/24 MODIFIED TO 5 SECONDS  5.  Patient will report ability to ambulate over various unlevel  surfaces for desired length (at least greater than 30 minutes) without a loss of balance to allow patient to travel and sight-see. Baseline:  Goal status: IN PROGRESS 10/17  6 Patient will improve dynamic balance by using an ankle strategy in order to reduce loss of balance in the posterior direction Baseline:  Goal status: IN PROGRESS 10/17  7. Improved mini- best score to 23/28 indicating improved dynamic balance and decreased risk of falls  Baseline: 17/28 Goal status: IN PROGRESS 10/17  20/28  PLAN:  PT FREQUENCY: 2x/week  PT DURATION: 12 weeks  (pt will be traveling for a few weeks in September)  PLANNED INTERVENTIONS: 02835- PT Re-evaluation, 97750- Physical Performance Testing, 97110-Therapeutic exercises, 97530- Therapeutic activity, [id number]- Neuromuscular re-education, 97535- Self Care, [id number]- Manual therapy, [id number]- Gait training, [phone]- Canalith repositioning, [id number]- Aquatic Therapy, [phone]- Electrical stimulation (unattended), [id number]- Electrical stimulation (manual), [id number]- Ultrasound, [id number]- Traction (mechanical), [id number]- Ionotophoresis 4mg /ml Dexamethasone , 79439 (1-2 muscles), 20561 (3+ muscles)- Dry Needling, Patient/Family education, Balance training, Stair training, Taping, Joint mobilization, Joint manipulation,  Spinal manipulation, Spinal mobilization, Cryotherapy, and Moist heat.  PLAN FOR NEXT SESSION: continue with prolonged and dynamic SLS, agility ladder with bwd activities, and crossing midline activities, eccentric quad strengthening  Mliss Cummins, PT  09/20/24 5:10 PM [facility] [address] [place], [place] [address] Phone # [phone] Fax [phone]

## 2024-09-20 ENCOUNTER — Encounter: Payer: Self-pay | Admitting: Physical Therapy

## 2024-09-20 ENCOUNTER — Ambulatory Visit: Admitting: Physical Therapy

## 2024-09-20 DIAGNOSIS — M5459 Other low back pain: Secondary | ICD-10-CM | POA: Diagnosis not present

## 2024-09-20 DIAGNOSIS — M6281 Muscle weakness (generalized): Secondary | ICD-10-CM

## 2024-09-20 DIAGNOSIS — R252 Cramp and spasm: Secondary | ICD-10-CM | POA: Diagnosis not present

## 2024-09-20 DIAGNOSIS — R2689 Other abnormalities of gait and mobility: Secondary | ICD-10-CM | POA: Diagnosis not present

## 2024-09-20 DIAGNOSIS — R262 Difficulty in walking, not elsewhere classified: Secondary | ICD-10-CM | POA: Diagnosis not present

## 2024-09-20 DIAGNOSIS — R293 Abnormal posture: Secondary | ICD-10-CM | POA: Diagnosis not present

## 2024-09-24 NOTE — Therapy (Signed)
 OUTPATIENT PHYSICAL THERAPY TREATMENT     Patient Name: [name] MRN: [id number] DOB:[birth year], 78 y.o., male Today's Date: 09/25/2024  END OF SESSION:  PT End of Session - 09/25/24 1717     Visit Number 21    Date for Recertification  10/11/24    Authorization Time Period 07/20/24 to 10/11/24    Authorization - Visit Number 12    Authorization - Number of Visits 18    Progress Note Due on Visit 28    PT Start Time 1620    PT Stop Time 1703    PT Time Calculation (min) 43 min    Activity Tolerance Patient tolerated treatment well    Behavior During Therapy [facility] for tasks assessed/performed                         Past Medical History:  Diagnosis Date   Abscess    right posterior neck   Anticoagulation adequate 06/07/2019   Overview:  SSS, paroxAFib/Flutter, CHADSvasc= 3 age, h/o cva; chronic OAC Xarelto    Atrial fibrillation and flutter (HCC) 01/01/2015   Noted 2016. Xarelto     BPH associated with nocturia 08/14/2014   Finasteride , flomax .  0-3x nocturia. PSA to 14-->eval urology [facility]. PSA trended back down to about 2.6 per records in 2015 then placed on finasteride - had significant voiding issues when spiked to 14- improved on meds    Bradycardia    CAD (coronary artery disease) 05/26/2016   Coronary CTA_ 50-75% D1 off CT report    Cardiac pacemaker in situ 12/28/2009   SA node dysfunction as cause    Chest pressure 12/01/2015   Overview:  atypical CP, [facility] nuclear perfusion imaging w/ no inarct, no ischemia, EF-40% NICM   Chronic systolic heart failure (HCC) 12/01/2015   Overview:  [facility] nuclear perfusion imaging study, no inarct, no ischemia, EF-40% NICM   CVA (cerebral vascular accident) (HCC)    Double vision 07/04/2018   Elevated LFTs    Erectile dysfunction 01/30/2015   cialis  prn    External hemorrhoids    Fitting or adjustment of cardiac pacemaker 06/07/2019   Overview:  [place]ron[place] DR [id number], RA and RV leads [id number]:  [id number] and [id number]    Former smoker 01/30/2015   Former smoker. 20 pack years quit 92. Patient declined AAA screen.     GERD (gastroesophageal reflux disease)    Gout 11/11/2009   Qualifier: Diagnosis of  By: Gardenia Pao     History of nuclear stress test 06/07/2019   Overview:  [facility], no inarct, no ischemia, EF-40% NICM   HTN (hypertension)    Hyperglycemia 06/10/2017   110s CBG 2018   Hyperlipidemia 07/12/2007   Crestor  20mg  daily with some myalgias; fish oil. Trig 200-500 despite this. LDL ok.        Insomnia 01/30/2015   ambien  5mg  prn. May refill.     Ischemic optic neuropathy    Left chest pressure 03/19/2015   Other and unspecified hyperlipidemia    Sinoatrial node dysfunction (HCC)    Spinal stenosis    Trigger finger, right middle finger 03/01/2018   Past Surgical History:  Procedure Laterality Date   ABSCESS DRAINAGE     righ tposterior neck   CARDIAC CATHETERIZATION  1996   CHOLECYSTECTOMY  02/07/2003   COLONOSCOPY  06/23/2004   EYE SURGERY     left eye   left ingunial hernia  06/08/2011   PACEMAKER INSERTION     PPM GENERATOR  CHANGEOUT N/A 07/12/2024   Procedure: PPM GENERATOR CHANGEOUT;  Surgeon: Waddell Danelle ORN, MD;  Location: [facility] INVASIVE CV LAB;  Service: Cardiovascular;  Laterality: N/A;   right hip replacement     2012 Dr. Hiram   Patient Active Problem List   Diagnosis Date Noted   Aortic atherosclerosis 08/01/2020   Fatty liver 08/01/2020   Paroxysmal atrial fibrillation (HCC) 07/27/2019   Anticoagulation adequate 06/07/2019   Convergence insufficiency 06/07/2019   Fitting or adjustment of cardiac pacemaker 06/07/2019   Pacemaker-dependent due to native cardiac rhythm insufficient to support life 06/07/2019   Palpitations 06/07/2019   History of nuclear stress test 06/07/2019   S/P cardiac cath 06/07/2019   Non-occlusive coronary artery disease 06/07/2019   Double vision 07/04/2018   Trigger finger, right middle finger 03/01/2018   Hyperglycemia 06/10/2017   Solitary  pulmonary nodule 05/26/2016   CAD (coronary artery disease) 05/26/2016   Nonischemic cardiomyopathy (HCC) 04/23/2016   Chronic systolic heart failure (HCC) 12/01/2015   Chest pressure 12/01/2015   Left chest pressure 03/19/2015   Insomnia 01/30/2015   Erectile dysfunction 01/30/2015   Cerebral infarction (HCC) 01/30/2015   Former smoker 01/30/2015   Atrial fibrillation and flutter (HCC) 01/01/2015   Allergic rhinitis 10/11/2014   BPH associated with nocturia 08/14/2014   Ventricular tachycardia, non-sustained (HCC) 01/25/2013   Sinoatrial node dysfunction (HCC)    SPINAL STENOSIS, LUMBAR 08/22/2010   GERD 03/17/2010   Cardiac pacemaker in situ 12/28/2009   Gout 11/11/2009   Hyperlipidemia 07/12/2007   NEUROPATHY, ISCHEMIC OPTIC 06/02/2007   Essential hypertension 06/02/2007    PCP: [name]., MD  REFERRING PROVIDER: [name], DO  REFERRING DIAG: M54.42,M54.41,G89.29 (ICD-10-CM) - Chronic bilateral low back pain with bilateral sciatica M51.362 (ICD-10-CM) - Degeneration of intervertebral disc of lumbar region with discogenic back pain and lower extremity pain  Rationale for Evaluation and Treatment: Rehabilitation  THERAPY DIAG:  Other abnormalities of gait and mobility  Other low back pain  Difficulty in walking, not elsewhere classified  Muscle weakness (generalized)  Balance disorder  ONSET DATE: February 2024 first visit to Dr Leonce  SUBJECTIVE:                                                                                                                                                                                           SUBJECTIVE STATEMENT: My foot won't bend.    PERTINENT HISTORY:  [name] (next upcoming trip 7/14 for a [place] cruise) OA, trigger fingers, right hip arthroplasty, pacemaker CVA 30 years ago affecting L side  PAIN:  07/27/24 Are you having pain? Yes: NPRS scale: 2/10 Pain location:  low back Pain  description: discomfort Aggravating factors: worst first thing in the morning or after lying on his couch, swimming Relieving factors: medication, movement  PRECAUTIONS: ICD/Pacemaker  RED FLAGS: None   WEIGHT BEARING RESTRICTIONS: No  FALLS:  Has patient fallen in last 6 months? Yes. Number of falls 1 fall.  Tripped over a chair while traveling at [place]  LIVING ENVIRONMENT: Lives with: lives with their spouse Lives in: House/apartment Stairs: one story Has following equipment at home: None  OCCUPATION: Retired  PLOF: Independent and Leisure: traveling (many upcoming trips planned), swimming  PATIENT GOALS: To feel better and not have to be reliant on pain medication as much and improve balance and flexibility.  NEXT MD VISIT: Follow up with Dr Leonce as needed  OBJECTIVE:  Note: Objective measures were completed at Evaluation unless otherwise noted.  DIAGNOSTIC FINDINGS:  Lumbar CT Scan on 02/04/2023: IMPRESSION: Multilevel degenerative changes which are most severe at the L3-4 level with significant spinal stenosis. No acute traumatic abnormalities identified.  PATIENT SURVEYS:  Modified Oswestry:  MODIFIED OSWESTRY DISABILITY SCALE   Date: 05/26/2024 Score  Pain intensity 2 =  Pain medication provides me with complete relief from pain.  2. Personal care (washing, dressing, etc.) 0 =  I can take care of myself normally without causing increased pain.  3. Lifting 2 = Pain prevents me from lifting heavy weights off the floor, activities (eg. sports, dancing). but I can manage if the weights are conveniently positioned (3) Pain prevents me from going out very often. (eg, on a table).  4. Walking 0 = Pain does not prevent me from walking any distance  5. Sitting 0 =  I can sit in any chair as long as I like.  6. Standing 1 =  I can stand as long as I want but, it increases my pain.  7. Sleeping 1 = I can sleep well only by using pain medication.  8. Social Life 0 = My  social life is normal and does not increase my pain.  9. Traveling 2 =  My pain restricts my travel over 2 hours.  10. Employment/ Homemaking 1 = My normal homemaking/job activities increase my pain, but I can still perform all that is required of me  Total 9 / 50 = 18.0 %   Interpretation of scores: Score Category Description  0-20% Minimal Disability The patient can cope with most living activities. Usually no treatment is indicated apart from advice on lifting, sitting and exercise  21-40% Moderate Disability The patient experiences more pain and difficulty with sitting, lifting and standing. Travel and social life are more difficult and they may be disabled from work. Personal care, sexual activity and sleeping are not grossly affected, and the patient can usually be managed by conservative means  41-60% Severe Disability Pain remains the main problem in this group, but activities of daily living are affected. These patients require a detailed investigation  61-80% Crippled Back pain impinges on all aspects of the patient's life. Positive intervention is required  81-100% Bed-bound  These patients are either bed-bound or exaggerating their symptoms  Bluford FORBES Zoe DELENA Karon DELENA, et al. Surgery versus conservative management of stable thoracolumbar fracture: the PRESTO feasibility RCT. [place] ([place]): [employer]; 2021 Nov. [facility] Technology Assessment, No. 25.62.) Appendix 3, Oswestry Disability Index category descriptors. Available from: Findjewelers.cz  Minimally Clinically Important Difference (MCID) = 12.8%   8/6/2 ODI 4/50 = 8%  COGNITION: Overall cognitive status: Within functional limits  for tasks assessed     SENSATION: Patient states that he has a neuropathy problem in his feet, but it has improved since starting Gabapentin   MUSCLE LENGTH: Hamstrings: increased tightness bilaterally Piriformis:  tightness bilat  POSTURE: rounded  shoulders and forward head   LUMBAR ROM:   Eval:  Decreased by approx 50%  LOWER EXTREMITY ROM:     WFL  LOWER EXTREMITY MMT:    Eval:   LE grossly WNL, but grossly 4+ to 5-/5 in right hip Core strength grossly 4-/5 throughout  LUMBAR SPECIAL TESTS:  Slump test: Negative  FUNCTIONAL TESTS:  Eval: 5 times sit to stand: 11.87 sec Single Leg Stance:  right- 1.53 sec, left- 1.40 sec  09/04/24  SLS R 4 sec; L 5 sec 09/13/24 SLS R 4 sec; L 6 sec  GAIT: Distance walked: >500 ft Assistive device utilized: None Level of assistance: Complete Independence Comments: States that he has more difficulty with balance on unlevel surfaces (like grass) than firm surfaces.     MINI-BESTest: Balance Evaluation Systems Test ANTICIPATORY: SIT TO STAND: (2) normal without use of hands and stabilizes independently     (1) Moderate comes to stand WITH use of hands on 1st attempt    (0) Severe: unable to stand up from chair without assistance OR needs several attempts with use of   hands Score:  2 07/19/24, 2 09/15/24  2. RISE TO TOES: feet shoulder width apart. Hands on hips.  Rise as high as you can onto your toes. Try to hold this pose for 3 sec                          (2) stable for 3s with maximum height    (1) heels up, but not full range (smaller than when holding hands) OR noticeable instability for 3 sec                           (0) < or equat to 3 s  Score:  1 07/19/24, 2 09/15/24  3. STAND ON ONE LEG: look straight ahead. Hands on hips. Lift your leg off the ground.     Left:  trial 1 __3__ trial 2__6_                                    Right: Trial 1:___1   Trial 2:____3___   (2) 20 s       (2)  20 s                (1) < 20      (1) < 20 s   (0) Unable      (0) unable Score (use the worst side):  1 07/19/24; 1 09/15/24  REACTIVE POSTURAL CONTROL 4. COMPENSATORY STEPPING CORRECTION FORWARD stand with feet shoulder width apart, arms at your sides. Lean forward against my hands  beyond your forward limits.  When I let go, do whatever is necessary, including taking a step, to avoid a fall   (2) recovers independently  with a single, large step, to avoid a fall   (1) more than one step used to recover equilibrium   (0) No step OR would fall if not caught Score:  0 07/19/24; 2 10/17  5. COMPENSATORY STEPPING CORRECTION BACKWARD: Lean backward against my hands beyond your backward limits.  When I let go, do whatever is necessary, including a step to avoid a fall. (2) recovers independently  with a single, large step, to avoid a fall   (1) more than one step used to recover equilibrium   (0) No step OR would fall if not caught Score:  0  07/19/24, 1 09/15/24  6. COMPENSATORY STEPPING LATERAL: Lean into my hand beyond your sideways limit.  When I let go, do whatever is necessary, including taking a step, to avoid a fall. Left           Right   (2) recovers independently with 1 step (crossover or lateral OK)   (1) several steps to recover equilibrium   (0) falls or cannot step Score:     0  (use the side with the lowest score) at eval;  L  and R 2  09/15/24  SENSORY ORIENTATION 7. STANCE FEET TOGETHER EYES OPEN  firm surface Be as stable and still as possible until I say stop   (2) 30s   (1) < 30 s   (0) unable Score:  2 07/19/24 and 09/15/24  8. STANCE ON FOAM EYES CLOSED feet together, eyes closed, hands on hips. Be as stable and still as you can until I stay stop.  I will start timing when you close your eyes. Time in seconds:      (2) 30s   (1) <30 s   (0) unable Score:  1 at eval and 09/15/24  9. INCLINE EYES CLOSED: Stand on incline with feet shoulder width apart, arms down at your sides.  I will start timing when you close your eyes   (2) Stands 30 s and aligns with gravity   (1) stands < 30 s OR aligns with surface   (0) unable Score:  1 07/19/24 and 09/15/24  DYNAMIC GAIT 10. CHANGE IN GAIT SPEED: begin walking at your normal speed, when I tell you  fast, walk as fast as you can, when I say slow walk very slowly   (2) significantly changes walking speed without imbalance   (1) unable to change walking speed or signs of imbalance   (0) unable to achieve significant change in walking speed AND signes of imbalance Score: 2 07/19/24 and 09/15/24  11. WALK WITH HEAD TURNS HORIZONTAL: begin walking at normal speed, when I say right, turn your head to the right, when I say left turn your head to the left.  Try to keep yourself walking in a straight line   (2) performs head turns with no change in gait speed and good balance   (1) performs head turns with reduction in gait speed   (0)  performs head turns with imbalance Score:  2 07/19/24 0 09/15/24  12. WALK WITH PIVOT TURNS: begin walking at your normal speed.  When I tell you to turn and stop, turn as quickly as you can, face the opposite direction and stop.  After the turn, your feet should be close together   (2) turns with feet close FAST in 3 steps with good balance   (1) turns with feet close SLOW > 4 steps with good balance   (0) cannot turn with feet close at any speed without imbalance Score:2 07/19/24 and 10/17  13. STEP OVER OBSTACLES:  ( 9 inch height 10 feet away from subject) begin walking at your normal speed.  When you get to the box, step over it, not around it and keep walking   (2) Able  to step over box with minimal change of gait speed and good balance   (1) Steps over box but touches box OR displays cautious behavior by slowing gait   (0) Unable to step over box OR steps around box Score:  2 07/19/24 and 10/17  14. TIMED UP AND GO NORMAL AND WITH DUAL TASK: 3 METER WALK: When I say go walk at your your normal speed across the tape, turn around and come back to sit in the chair   Time: Count backwards by 3s starting at    20.  When I say go, stand up from the chair, walk at your normal speed across the tape on the floor, turn around, come back to sit in the chair.   Continue counting backwards the entire time.  Time:   (2) No noticeable change in sitting, standing or walking while backward counting when compared to TUG without dual task   (1) Dual task affects either counting OR walking (>10%)    (0) stops counting while walking OR stops walking while counting If gait speed slows more than 10% between TUG without and with dual task, the score should be decreased by a point Score: 1 8/20/252 10/17   Total score:       17    /28  on 07/19/24    20/28 on 10 /17/ 25      TREATMENT DATE:  09/25/24(SBA/CGA at all times) Vibration plate 6k69 sec with squats Aura carry unilaterally 15# KB hallway Farmer carry 15# with marching - prolonged stance  Step back lunges x 10 Lateral lunges x 10  Toe taps fwd and back crossing midline - also die front taps to lateral x 10 ea; back taps to lateral x 10 Standing dynamic HS stretch B at barre Melt balls to bottom of feet to increase mobility  Manual: R ankle PROM with distraction; STM to extensor digitorum brevis; calcaneal mobs    09/20/24(SBA/CGA at all times) Aura carry unilaterally 15# KB around cancer [employer] Farmer carry 8# overhead x 2 hallway B 30# carry x 1 hall way B Step back lunges x 10 Lateral lunges x 10  Heel/toe walking  4 laps at barre ea Reach behinds to counter in different stance positions Hurdles with 3 sec knee raises fwd multiple reps  Sit to stands x 5, 3 with narrow base, 8# x 8 Toe taps fwd and back crossing midline - max 2  Step ups 20# KB onto aerobic step + 1 riser x 10 B   09/18/24(SBA/CGA at all times) Step taps to steps 1-2-3 steps at stair holding 10# KB B Reach behinds to chair with opposite hand x 10 ea Reach behinds to counter in different stance positions Hurdles with 3 sec knee raises fwd multiple reps  Sit to standsx 5 Step ups 20# KB onto aerobic step + 1 riser x 10 B Running man's focusing on form including arms and prolonged hold on one leg Worked on short  foot in standing to avoid rolling to lateral foot Divers to chair with one finger support x 5 ea Resisted walking fwd/bwd 20#  5 ea   09/16/24(SBA/CGA at all times) Mini best test completed Reach behinds to counter in different stance positions Bouncing lacrosse ball weaving in/out of cones - no problem Step ups 20# KB onto aerobic step + 1 riser x 10 B   09/13/24(SBA/CGA at all times) Step taps to steps 1-2-3 steps at stair holding 10# KB B Running man's focusing  on form including arms and prolonged hold on one leg Stepping strategies lateral and backward Decline wall squat 2x10 Hurdles with 3 sec knee raises fwd multiple reps  Divers with one finger support x 5 ea Three way taps to small cones x 5 B Braiding forward and backward crosses   PATIENT EDUCATION:  Education details: Issued HEP Person educated: Patient Education method: Explanation, Demonstration, and Handouts Education comprehension: verbalized understanding and returned demonstration  HOME EXERCISE PROGRAM: Access Code: [facility] URL: [email or web address] Date: 07/25/2024 Prepared by: Mliss  Exercises - Seated Transversus Abdominis Bracing  - 1 x daily - 7 x weekly - 2 sets - 10 reps - Seated Hamstring Stretch  - 1 x daily - 7 x weekly - 2 reps - 20 sec hold - Seated Piriformis Stretch  - 1 x daily - 7 x weekly - 2 reps - 20 sec hold - Single Leg Stance with Support  - 1 x daily - 7 x weekly - 2 reps - 20 sec hold - Shoulder External Rotation and Scapular Retraction with Resistance  - 1 x daily - 7 x weekly - 2 sets - 10 reps - Standing Shoulder Horizontal Abduction with Resistance  - 1 x daily - 7 x weekly - 2 sets - 10 reps - Standing Shoulder Row with Anchored Resistance  - 1 x daily - 7 x weekly - 2 sets - 10 reps - Shoulder extension with resistance - Neutral  - 1 x daily - 7 x weekly - 2 sets - 10 reps - Single Leg Balance with Clock Reach  - 1 x daily - 7 x weekly - 10 reps - Standing  'L' Stretch at Counter  - 1 x daily - 7 x weekly - 2 reps - 20 sec hold - Resisted Sit-to-Stand With Dumbbell at Chest  - 1 x daily - 3 x weekly - 2 sets - 10 reps - Side Step Overs with Cones and Unilateral Counter Support  - 1 x daily - 3 x weekly - 2 sets - 10 reps - Standing 3-Way Leg Reach with Resistance at Ankles and Counter Support  - 1 x daily - 3 x weekly - 2 sets - 10 reps - Side Stepping with Resistance at Ankles  - 1 x daily - 3 x weekly - 2 sets - 10 reps - Forward Monster Walk with Resistance at Ankles and Counter Support  - 1 x daily - 3 x weekly - 2 sets - 10 reps - Squat with Chair Touch  - 1 x daily - 3 x weekly - 2 sets - 10 reps - Kneeling Plantarflexion Stretch  - 1 x daily - 3 x weekly - 1 sets - 3 reps - 30 sec hold - Supine Ankle Dorsiflexion and Plantarflexion AROM  - 1 x daily - 7 x weekly - 2 sets - 10 reps - Supine Ankle Inversion and Eversion AROM  - 1 x daily - 7 x weekly - 2 sets - 10 reps - Supine Ankle Circles  - 1 x daily - 7 x weekly - 2 sets - 10 reps - Seated Ankle Alphabet  - 1 x daily - 7 x weekly - 1 sets - 1 reps  ASSESSMENT:  CLINICAL IMPRESSION: Patient presents today with complaints of right ankle pain. He denies lateral ankle sprain, but is tender in the ATFL and extensor digitorum brevis. He responded well to manual therapy and malt balls for tissue mobility. He liked the vibration plate and  demonstrated slightly improved SLS immediately following. He would benefit from trying this at the start of treatment as well as working on more plantar mobility.    OBJECTIVE IMPAIRMENTS: decreased balance, decreased strength, increased muscle spasms, impaired flexibility, postural dysfunction, and pain.   ACTIVITY LIMITATIONS: lifting, standing, and stairs  PARTICIPATION LIMITATIONS: driving, community activity, and yard work  PERSONAL FACTORS: Time since onset of injury/illness/exacerbation and 1-2 comorbidities: OA, right hip arthroplasty are also  affecting patient's functional outcome.   REHAB POTENTIAL: Good  CLINICAL DECISION MAKING: Stable/uncomplicated  EVALUATION COMPLEXITY: Low   GOALS: Goals reviewed with patient? Yes  SHORT TERM GOALS: Target date: 06/16/2024  Patient will be independent with initial HEP. Baseline: Goal status: Met on 05/31/24  2.  Patient will demonstrate and verbalize knowledge of proper lifting techniques to allow him to lift suitcases for travel. Baseline:  Goal status: MET   LONG TERM GOALS: Target date: 10/11/2024    Patient will be independent with advanced HEP to allow for self progression after discharge. Baseline:  Goal status: IN PROGRESS 10/17  2.  Patient will improve modified Oswestry to not greater than 5% to demonstrate improved functional mobility. Baseline: 18% Goal status: IN PROGRESS 07/05/24  8%   3.  Patient will increase right hip strength to [facility] to allow him to navigate stairs with reciprocal pattern without difficulty/discomfort. Baseline:  Goal status: MET 07/05/24  4.  Patient will increase bilateral single leg stance to greater than 5 seconds to decrease patients fall risk. Baseline: right- 1.53 sec, left- 1.40 sec Goal status: IN PROGRESS 09/15/24 MODIFIED TO 5 SECONDS  5.  Patient will report ability to ambulate over various unlevel surfaces for desired length (at least greater than 30 minutes) without a loss of balance to allow patient to travel and sight-see. Baseline:  Goal status: IN PROGRESS 10/17  6 Patient will improve dynamic balance by using an ankle strategy in order to reduce loss of balance in the posterior direction Baseline:  Goal status: IN PROGRESS 10/17  7. Improved mini- best score to 23/28 indicating improved dynamic balance and decreased risk of falls  Baseline: 17/28 Goal status: IN PROGRESS 10/17  20/28  PLAN:  PT FREQUENCY: 2x/week  PT DURATION: 12 weeks  (pt will be traveling for a few weeks in September)  PLANNED  INTERVENTIONS: 02835- PT Re-evaluation, 97750- Physical Performance Testing, 97110-Therapeutic exercises, 97530- Therapeutic activity, [id number]- Neuromuscular re-education, 97535- Self Care, 02859- Manual therapy, [id number]- Gait training, [phone]- Canalith repositioning, [id number]- Aquatic Therapy, [phone]- Electrical stimulation (unattended), [id number]- Electrical stimulation (manual), [id number]- Ultrasound, [id number]- Traction (mechanical), [id number]- Ionotophoresis 4mg /ml Dexamethasone , [id number] (1-2 muscles), [id number] (3+ muscles)- Dry Needling, Patient/Family education, Balance training, Stair training, Taping, Joint mobilization, Joint manipulation, Spinal manipulation, Spinal mobilization, Cryotherapy, and Moist heat.  PLAN FOR NEXT SESSION: continue with prolonged and dynamic SLS, agility ladder with bwd activities, and crossing midline activities, eccentric quad strengthening  Mliss Cummins, PT  09/25/24 5:19 PM [facility] [address], [address] [place], [place] [address] Phone # [phone] [phone]

## 2024-09-25 ENCOUNTER — Ambulatory Visit: Admitting: Physical Therapy

## 2024-09-25 ENCOUNTER — Other Ambulatory Visit: Payer: Self-pay | Admitting: Family Medicine

## 2024-09-25 ENCOUNTER — Encounter: Payer: Self-pay | Admitting: Physical Therapy

## 2024-09-25 DIAGNOSIS — R293 Abnormal posture: Secondary | ICD-10-CM | POA: Diagnosis not present

## 2024-09-25 DIAGNOSIS — M5459 Other low back pain: Secondary | ICD-10-CM | POA: Diagnosis not present

## 2024-09-25 DIAGNOSIS — M6281 Muscle weakness (generalized): Secondary | ICD-10-CM

## 2024-09-25 DIAGNOSIS — R262 Difficulty in walking, not elsewhere classified: Secondary | ICD-10-CM | POA: Diagnosis not present

## 2024-09-25 DIAGNOSIS — R2689 Other abnormalities of gait and mobility: Secondary | ICD-10-CM | POA: Diagnosis not present

## 2024-09-25 DIAGNOSIS — R252 Cramp and spasm: Secondary | ICD-10-CM | POA: Diagnosis not present

## 2024-09-27 ENCOUNTER — Encounter: Payer: Self-pay | Admitting: Physical Therapy

## 2024-09-27 ENCOUNTER — Ambulatory Visit: Admitting: Physical Therapy

## 2024-09-27 DIAGNOSIS — R262 Difficulty in walking, not elsewhere classified: Secondary | ICD-10-CM

## 2024-09-27 DIAGNOSIS — R252 Cramp and spasm: Secondary | ICD-10-CM | POA: Diagnosis not present

## 2024-09-27 DIAGNOSIS — R2689 Other abnormalities of gait and mobility: Secondary | ICD-10-CM | POA: Diagnosis not present

## 2024-09-27 DIAGNOSIS — M6281 Muscle weakness (generalized): Secondary | ICD-10-CM

## 2024-09-27 DIAGNOSIS — R293 Abnormal posture: Secondary | ICD-10-CM | POA: Diagnosis not present

## 2024-09-27 DIAGNOSIS — M5459 Other low back pain: Secondary | ICD-10-CM

## 2024-09-27 NOTE — Therapy (Signed)
 OUTPATIENT PHYSICAL THERAPY TREATMENT     Patient Name: [name] MRN: [id number] DOB:[birth year], 78 y.o., male Today's Date: 09/27/2024  END OF SESSION:  PT End of Session - 09/27/24 1617     Visit Number 22    Date for Recertification  10/11/24    Authorization Type [employer] MCR - re-auth submitted 10/20 to 12/8    Authorization Time Period 07/20/24 to 10/11/24    Authorization - Visit Number 13    Authorization - Number of Visits 18    Progress Note Due on Visit 28    PT Start Time 1617    PT Stop Time 1700    PT Time Calculation (min) 43 min    Activity Tolerance Patient tolerated treatment well    Behavior During Therapy [facility] for tasks assessed/performed                         Past Medical History:  Diagnosis Date   Abscess    right posterior neck   Anticoagulation adequate 06/07/2019   Overview:  SSS, paroxAFib/Flutter, CHADSvasc= 3 age, h/o cva; chronic OAC Xarelto    Atrial fibrillation and flutter (HCC) 01/01/2015   Noted 2016. Xarelto     BPH associated with nocturia 08/14/2014   Finasteride , flomax .  0-3x nocturia. PSA to 14-->eval urology [facility]. PSA trended back down to about 2.6 per records in 2015 then placed on finasteride - had significant voiding issues when spiked to 14- improved on meds    Bradycardia    CAD (coronary artery disease) 05/26/2016   Coronary CTA_ 50-75% D1 off CT report    Cardiac pacemaker in situ 12/28/2009   SA node dysfunction as cause    Chest pressure 12/01/2015   Overview:  atypical CP, [facility] nuclear perfusion imaging w/ no inarct, no ischemia, EF-40% NICM   Chronic systolic heart failure (HCC) 12/01/2015   Overview:  [facility] nuclear perfusion imaging study, no inarct, no ischemia, EF-40% NICM   CVA (cerebral vascular accident) (HCC)    Double vision 07/04/2018   Elevated LFTs    Erectile dysfunction 01/30/2015   cialis  prn    External hemorrhoids    Fitting or adjustment of cardiac pacemaker 06/07/2019   Overview:  [employer]  [place] DR [id number], RA and RV leads [id number]:  [id number] and [id number]   Former smoker 01/30/2015   Former smoker. 20 pack years quit 92. Patient declined AAA screen.     GERD (gastroesophageal reflux disease)    Gout 11/11/2009   Qualifier: Diagnosis of  By: Gardenia Pao     History of nuclear stress test 06/07/2019   Overview:  [facility], no inarct, no ischemia, EF-40% NICM   HTN (hypertension)    Hyperglycemia 06/10/2017   110s CBG 2018   Hyperlipidemia 07/12/2007   Crestor  20mg  daily with some myalgias; fish oil. Trig 200-500 despite this. LDL ok.        Insomnia 01/30/2015   ambien  5mg  prn. May refill.     Ischemic optic neuropathy    Left chest pressure 03/19/2015   Other and unspecified hyperlipidemia    Sinoatrial node dysfunction (HCC)    Spinal stenosis    Trigger finger, right middle finger 03/01/2018   Past Surgical History:  Procedure Laterality Date   ABSCESS DRAINAGE     righ tposterior neck   CARDIAC CATHETERIZATION  1996   CHOLECYSTECTOMY  02/07/2003   COLONOSCOPY  06/23/2004   EYE SURGERY     left eye   left ingunial  hernia  06/08/2011   PACEMAKER INSERTION     PPM GENERATOR CHANGEOUT N/A 07/12/2024   Procedure: PPM GENERATOR CHANGEOUT;  Surgeon: Nathan Danelle ORN, MD;  Location: [facility];  Service: Cardiovascular;  Laterality: N/A;   right hip replacement     2012 Dr. Hiram   Patient Active Problem List   Diagnosis Date Noted   Aortic atherosclerosis 08/01/2020   Fatty liver 08/01/2020   Paroxysmal atrial fibrillation (HCC) 07/27/2019   Anticoagulation adequate 06/07/2019   Convergence insufficiency 06/07/2019   Fitting or adjustment of cardiac pacemaker 06/07/2019   Pacemaker-dependent due to native cardiac rhythm insufficient to support life 06/07/2019   Palpitations 06/07/2019   History of nuclear stress test 06/07/2019   S/P cardiac cath 06/07/2019   Non-occlusive coronary artery disease 06/07/2019   Double vision 07/04/2018   Trigger finger,  right middle finger 03/01/2018   Hyperglycemia 06/10/2017   Solitary pulmonary nodule 05/26/2016   CAD (coronary artery disease) 05/26/2016   Nonischemic cardiomyopathy (HCC) 04/23/2016   Chronic systolic heart failure (HCC) 12/01/2015   Chest pressure 12/01/2015   Left chest pressure 03/19/2015   Insomnia 01/30/2015   Erectile dysfunction 01/30/2015   Cerebral infarction (HCC) 01/30/2015   Former smoker 01/30/2015   Atrial fibrillation and flutter (HCC) 01/01/2015   Allergic rhinitis 10/11/2014   BPH associated with nocturia 08/14/2014   Ventricular tachycardia, non-sustained (HCC) 01/25/2013   Sinoatrial node dysfunction (HCC)    SPINAL STENOSIS, LUMBAR 08/22/2010   GERD 03/17/2010   Cardiac pacemaker in situ 12/28/2009   Gout 11/11/2009   Hyperlipidemia 07/12/2007   NEUROPATHY, ISCHEMIC OPTIC 06/02/2007   Essential hypertension 06/02/2007    PCP: [name]., MD  REFERRING PROVIDER: [name], DO  REFERRING DIAG: M54.42,M54.41,G89.29 (ICD-10-CM) - Chronic bilateral low back pain with bilateral sciatica M51.362 (ICD-10-CM) - Degeneration of intervertebral disc of lumbar region with discogenic back pain and lower extremity pain  Rationale for Evaluation and Treatment: Rehabilitation  THERAPY DIAG:  Other abnormalities of gait and mobility  Other low back pain  Difficulty in walking, not elsewhere classified  Muscle weakness (generalized)  Balance disorder  ONSET DATE: February 2024 first visit to Dr Nathan  SUBJECTIVE:                                                                                                                                                                                           SUBJECTIVE STATEMENT: Everything's better.    PERTINENT HISTORY:  [name] frequently (next upcoming trip 7/14 for a [place] cruise) OA, trigger fingers, right hip arthroplasty, pacemaker CVA 30 years ago affecting L side  PAIN:  07/27/24 Are you  having pain? Yes: NPRS scale: 2/10 Pain location: low back Pain description: discomfort Aggravating factors: worst first thing in the morning or after lying on his couch, swimming Relieving factors: medication, movement  PRECAUTIONS: ICD/Pacemaker  RED FLAGS: None   WEIGHT BEARING RESTRICTIONS: No  FALLS:  Has patient fallen in last 6 months? Yes. Number of falls 1 fall.  Tripped over a chair while traveling at [place]  LIVING ENVIRONMENT: Lives with: lives with their spouse Lives in: House/apartment Stairs: one story Has following equipment at home: None  OCCUPATION: Retired  PLOF: Independent and Leisure: traveling (many upcoming trips planned), swimming  PATIENT GOALS: To feel better and not have to be reliant on pain medication as much and improve balance and flexibility.  NEXT MD VISIT: Follow up with Dr Nathan as needed  OBJECTIVE:  Note: Objective measures were completed at Evaluation unless otherwise noted.  DIAGNOSTIC FINDINGS:  Lumbar CT Scan on 02/04/2023: IMPRESSION: Multilevel degenerative changes which are most severe at the L3-4 level with significant spinal stenosis. No acute traumatic abnormalities identified.  PATIENT SURVEYS:  Modified Oswestry:  MODIFIED OSWESTRY DISABILITY SCALE   Date: 05/26/2024 Score  Pain intensity 2 =  Pain medication provides me with complete relief from pain.  2. Personal care (washing, dressing, etc.) 0 =  I can take care of myself normally without causing increased pain.  3. Lifting 2 = Pain prevents me from lifting heavy weights off the floor, activities (eg. sports, dancing). but I can manage if the weights are conveniently positioned (3) Pain prevents me from going out very often. (eg, on a table).  4. Walking 0 = Pain does not prevent me from walking any distance  5. Sitting 0 =  I can sit in any chair as long as I like.  6. Standing 1 =  I can stand as long as I want but, it increases my pain.  7. Sleeping 1 = I can  sleep well only by using pain medication.  8. Social Life 0 = My social life is normal and does not increase my pain.  9. Traveling 2 =  My pain restricts my travel over 2 hours.  10. Employment/ Homemaking 1 = My normal homemaking/job activities increase my pain, but I can still perform all that is required of me  Total 9 / 50 = 18.0 %   Interpretation of scores: Score Category Description  0-20% Minimal Disability The patient can cope with most living activities. Usually no treatment is indicated apart from advice on lifting, sitting and exercise  21-40% Moderate Disability The patient experiences more pain and difficulty with sitting, lifting and standing. Travel and social life are more difficult and they may be disabled from work. Personal care, sexual activity and sleeping are not grossly affected, and the patient can usually be managed by conservative means  41-60% Severe Disability Pain remains the main problem in this group, but activities of daily living are affected. These patients require a detailed investigation  61-80% Crippled Back pain impinges on all aspects of the patient's life. Positive intervention is required  81-100% Bed-bound  These patients are either bed-bound or exaggerating their symptoms  Bluford FORBES Zoe DELENA Karon DELENA, et al. Surgery versus conservative management of stable thoracolumbar fracture: the PRESTO feasibility RCT. [place] ([place]): [employer]; 2021 Nov. [facility] Technology Assessment, No. 25.62.) Appendix 3, Oswestry Disability Index category descriptors. Available from: Findjewelers.cz  Minimally Clinically Important Difference (MCID) = 12.8%   8/6/2 ODI  4/50 = 8%  COGNITION: Overall cognitive status: Within functional limits for tasks assessed     SENSATION: Patient states that he has a neuropathy problem in his feet, but it has improved since starting Gabapentin   MUSCLE LENGTH: Hamstrings: increased  tightness bilaterally Piriformis:  tightness bilat  POSTURE: rounded shoulders and forward head   LUMBAR ROM:   Eval:  Decreased by approx 50%  LOWER EXTREMITY ROM:     WFL  LOWER EXTREMITY MMT:    Eval:   LE grossly WNL, but grossly 4+ to 5-/5 in right hip Core strength grossly 4-/5 throughout  LUMBAR SPECIAL TESTS:  Slump test: Negative  FUNCTIONAL TESTS:  Eval: 5 times sit to stand: 11.87 sec Single Leg Stance:  right- 1.53 sec, left- 1.40 sec  09/04/24  SLS R 4 sec; L 5 sec 09/13/24 SLS R 4 sec; L 6 sec 09/27/24  SLS R 5 sec; L 10 sec  GAIT: Distance walked: >500 ft Assistive device utilized: None Level of assistance: Complete Independence Comments: States that he has more difficulty with balance on unlevel surfaces (like grass) than firm surfaces.     MINI-BESTest: Balance Evaluation Systems Test ANTICIPATORY: SIT TO STAND: (2) normal without use of hands and stabilizes independently     (1) Moderate comes to stand WITH use of hands on 1st attempt    (0) Severe: unable to stand up from chair without assistance OR needs several attempts with use of   hands Score:  2 07/19/24, 2 09/15/24  2. RISE TO TOES: feet shoulder width apart. Hands on hips.  Rise as high as you can onto your toes. Try to hold this pose for 3 sec                          (2) stable for 3s with maximum height    (1) heels up, but not full range (smaller than when holding hands) OR noticeable instability for 3 sec                           (0) < or equat to 3 s  Score:  1 07/19/24, 2 09/15/24  3. STAND ON ONE LEG: look straight ahead. Hands on hips. Lift your leg off the ground.     Left:  trial 1 __3__ trial 2__6_                                    Right: Trial 1:___1   Trial 2:____3___   (2) 20 s       (2)  20 s                (1) < 20      (1) < 20 s   (0) Unable      (0) unable Score (use the worst side):  1 07/19/24; 1 09/15/24  REACTIVE POSTURAL CONTROL 4. COMPENSATORY STEPPING  CORRECTION FORWARD stand with feet shoulder width apart, arms at your sides. Lean forward against my hands beyond your forward limits.  When I let go, do whatever is necessary, including taking a step, to avoid a fall   (2) recovers independently  with a single, large step, to avoid a fall   (1) more than one step used to recover equilibrium   (0) No step OR would fall if not caught Score:  0 07/19/24; 2 10/17  5. COMPENSATORY STEPPING CORRECTION BACKWARD: Lean backward against my hands beyond your backward limits.  When I let go, do whatever is necessary, including a step to avoid a fall. (2) recovers independently  with a single, large step, to avoid a fall   (1) more than one step used to recover equilibrium   (0) No step OR would fall if not caught Score:  0  07/19/24, 1 09/15/24  6. COMPENSATORY STEPPING LATERAL: Lean into my hand beyond your sideways limit.  When I let go, do whatever is necessary, including taking a step, to avoid a fall. Left           Right   (2) recovers independently with 1 step (crossover or lateral OK)   (1) several steps to recover equilibrium   (0) falls or cannot step Score:     0  (use the side with the lowest score) at eval;  L  and R 2  09/15/24  SENSORY ORIENTATION 7. STANCE FEET TOGETHER EYES OPEN  firm surface Be as stable and still as possible until I say stop   (2) 30s   (1) < 30 s   (0) unable Score:  2 07/19/24 and 09/15/24  8. STANCE ON FOAM EYES CLOSED feet together, eyes closed, hands on hips. Be as stable and still as you can until I stay stop.  I will start timing when you close your eyes. Time in seconds:      (2) 30s   (1) <30 s   (0) unable Score:  1 at eval and 09/15/24  9. INCLINE EYES CLOSED: Stand on incline with feet shoulder width apart, arms down at your sides.  I will start timing when you close your eyes   (2) Stands 30 s and aligns with gravity   (1) stands < 30 s OR aligns with surface   (0) unable Score:  1 07/19/24 and  09/15/24  DYNAMIC GAIT 10. CHANGE IN GAIT SPEED: begin walking at your normal speed, when I tell you fast, walk as fast as you can, when I say slow walk very slowly   (2) significantly changes walking speed without imbalance   (1) unable to change walking speed or signs of imbalance   (0) unable to achieve significant change in walking speed AND signes of imbalance Score: 2 07/19/24 and 09/15/24  11. WALK WITH HEAD TURNS HORIZONTAL: begin walking at normal speed, when I say right, turn your head to the right, when I say left turn your head to the left.  Try to keep yourself walking in a straight line   (2) performs head turns with no change in gait speed and good balance   (1) performs head turns with reduction in gait speed   (0)  performs head turns with imbalance Score:  2 07/19/24 0 09/15/24  12. WALK WITH PIVOT TURNS: begin walking at your normal speed.  When I tell you to turn and stop, turn as quickly as you can, face the opposite direction and stop.  After the turn, your feet should be close together   (2) turns with feet close FAST in 3 steps with good balance   (1) turns with feet close SLOW > 4 steps with good balance   (0) cannot turn with feet close at any speed without imbalance Score:2 07/19/24 and 10/17  13. STEP OVER OBSTACLES:  ( 9 inch height 10 feet away from subject) begin walking at your normal speed.  When you get to the box, step over it, not around it and keep walking   (2) Able to step over box with minimal change of gait speed and good balance   (1) Steps over box but touches box OR displays cautious behavior by slowing gait   (0) Unable to step over box OR steps around box Score:  2 07/19/24 and 10/17  14. TIMED UP AND GO NORMAL AND WITH DUAL TASK: 3 METER WALK: When I say go walk at your your normal speed across the tape, turn around and come back to sit in the chair   Time: Count backwards by 3s starting at    20.  When I say go, stand up from the chair,  walk at your normal speed across the tape on the floor, turn around, come back to sit in the chair.  Continue counting backwards the entire time.  Time:   (2) No noticeable change in sitting, standing or walking while backward counting when compared to TUG without dual task   (1) Dual task affects either counting OR walking (>10%)    (0) stops counting while walking OR stops walking while counting If gait speed slows more than 10% between TUG without and with dual task, the score should be decreased by a point Score: 1 8/20/252 10/17   Total score:       17    /28  on 07/19/24    20/28 on 10 /17/ 25      TREATMENT DATE:  09/27/24(SBA/CGA at all times) Vibration plate 6k69 sec with squats for calf stretch Farmer carry 15# with marching - prolonged stance time x 30 ft B At wall: hip hinge to work on post hip strategy, back to wall OH reach pushing hips forward to work on ant hip strategy, lateral SB to wall pushing weight through outside foot Forward lunge in II bars with push off to stand max reps  Step back lunges in II bars with push off to fatigue Lateral lunges in II bars with push off  Foam beam in II bars: side stepping - very challenging II bars: braiding front, back and alternating II bars: front to back touches II bars: SLS  L max 10 sec, R max 5 sec Sit to stands 5# x 5, squat to chair with 5# x 10   09/25/24(SBA/CGA at all times) Vibration plate 6k69 sec with squats Aura carry unilaterally 15# KB hallway Farmer carry 15# with marching - prolonged stance  Step back lunges x 10 Lateral lunges x 10  Toe taps fwd and back crossing midline - also die front taps to lateral x 10 ea; back taps to lateral x 10 Standing dynamic HS stretch B at barre Melt balls to bottom of feet to increase mobility  Manual: R ankle PROM with distraction; STM to extensor digitorum brevis; calcaneal mobs    09/20/24(SBA/CGA at all times) Aura carry unilaterally 15# KB around cancer  gym Farmer carry 8# overhead x 2 hallway B 30# carry x 1 hall way B Step back lunges x 10 Lateral lunges x 10  Heel/toe walking  4 laps at barre ea Reach behinds to counter in different stance positions Hurdles with 3 sec knee raises fwd multiple reps  Sit to stands x 5, 3 with narrow base, 8# x 8 Toe taps fwd and back crossing midline - max 2  Step ups 20# KB onto aerobic step + 1 riser x 10 B   09/18/24(SBA/CGA at all times)  Step taps to steps 1-2-3 steps at stair holding 10# KB B Reach behinds to chair with opposite hand x 10 ea Reach behinds to counter in different stance positions Hurdles with 3 sec knee raises fwd multiple reps  Sit to standsx 5 Step ups 20# KB onto aerobic step + 1 riser x 10 B Running man's focusing on form including arms and prolonged hold on one leg Worked on short foot in standing to avoid rolling to lateral foot Divers to chair with one finger support x 5 ea Resisted walking fwd/bwd 20#  5 ea   09/16/24(SBA/CGA at all times) Mini best test completed Reach behinds to counter in different stance positions Bouncing lacrosse ball weaving in/out of cones - no problem Step ups 20# KB onto aerobic step + 1 riser x 10 B   09/13/24(SBA/CGA at all times) Step taps to steps 1-2-3 steps at stair holding 10# KB B Running man's focusing on form including arms and prolonged hold on one leg Stepping strategies lateral and backward Decline wall squat 2x10 Hurdles with 3 sec knee raises fwd multiple reps  Divers with one finger support x 5 ea Three way taps to small cones x 5 B Braiding forward and backward crosses   PATIENT EDUCATION:  Education details: Issued HEP Person educated: Patient Education method: Explanation, Demonstration, and Handouts Education comprehension: verbalized understanding and returned demonstration  HOME EXERCISE PROGRAM: Access Code: [facility] URL: [email or web address] Date: 07/25/2024 Prepared by:  Mliss  Exercises - Seated Transversus Abdominis Bracing  - 1 x daily - 7 x weekly - 2 sets - 10 reps - Seated Hamstring Stretch  - 1 x daily - 7 x weekly - 2 reps - 20 sec hold - Seated Piriformis Stretch  - 1 x daily - 7 x weekly - 2 reps - 20 sec hold - Single Leg Stance with Support  - 1 x daily - 7 x weekly - 2 reps - 20 sec hold - Shoulder External Rotation and Scapular Retraction with Resistance  - 1 x daily - 7 x weekly - 2 sets - 10 reps - Standing Shoulder Horizontal Abduction with Resistance  - 1 x daily - 7 x weekly - 2 sets - 10 reps - Standing Shoulder Row with Anchored Resistance  - 1 x daily - 7 x weekly - 2 sets - 10 reps - Shoulder extension with resistance - Neutral  - 1 x daily - 7 x weekly - 2 sets - 10 reps - Single Leg Balance with Clock Reach  - 1 x daily - 7 x weekly - 10 reps - Standing 'L' Stretch at Counter  - 1 x daily - 7 x weekly - 2 reps - 20 sec hold - Resisted Sit-to-Stand With Dumbbell at Chest  - 1 x daily - 3 x weekly - 2 sets - 10 reps - Side Step Overs with Cones and Unilateral Counter Support  - 1 x daily - 3 x weekly - 2 sets - 10 reps - Standing 3-Way Leg Reach with Resistance at Ankles and Counter Support  - 1 x daily - 3 x weekly - 2 sets - 10 reps - Side Stepping with Resistance at Ankles  - 1 x daily - 3 x weekly - 2 sets - 10 reps - Forward Monster Walk with Resistance at Ankles and Counter Support  - 1 x daily - 3 x weekly - 2 sets - 10 reps - Squat with Chair Touch  - 1 x daily -  3 x weekly - 2 sets - 10 reps - Kneeling Plantarflexion Stretch  - 1 x daily - 3 x weekly - 1 sets - 3 reps - 30 sec hold - Supine Ankle Dorsiflexion and Plantarflexion AROM  - 1 x daily - 7 x weekly - 2 sets - 10 reps - Supine Ankle Inversion and Eversion AROM  - 1 x daily - 7 x weekly - 2 sets - 10 reps - Supine Ankle Circles  - 1 x daily - 7 x weekly - 2 sets - 10 reps - Seated Ankle Alphabet  - 1 x daily - 7 x weekly - 1 sets - 1 reps  ASSESSMENT:  CLINICAL  IMPRESSION: Patient reporting no pain any longer in his feet or anywhere. He doesn't know why. He likes the vibration plate and feels a good calf stretch with it. We worked on hip and stepping strategies today. Patient demonstrated increased SLS time B today. L side was fairly stable. R side still requires arm movement to maintain balance.     OBJECTIVE IMPAIRMENTS: decreased balance, decreased strength, increased muscle spasms, impaired flexibility, postural dysfunction, and pain.   ACTIVITY LIMITATIONS: lifting, standing, and stairs  PARTICIPATION LIMITATIONS: driving, community activity, and yard work  PERSONAL FACTORS: Time since onset of injury/illness/exacerbation and 1-2 comorbidities: OA, right hip arthroplasty are also affecting patient's functional outcome.   REHAB POTENTIAL: Good  CLINICAL DECISION MAKING: Stable/uncomplicated  EVALUATION COMPLEXITY: Low   GOALS: Goals reviewed with patient? Yes  SHORT TERM GOALS: Target date: 06/16/2024  Patient will be independent with initial HEP. Baseline: Goal status: Met on 05/31/24  2.  Patient will demonstrate and verbalize knowledge of proper lifting techniques to allow him to lift suitcases for travel. Baseline:  Goal status: MET   LONG TERM GOALS: Target date: 10/11/2024    Patient will be independent with advanced HEP to allow for self progression after discharge. Baseline:  Goal status: IN PROGRESS 10/17  2.  Patient will improve modified Oswestry to not greater than 5% to demonstrate improved functional mobility. Baseline: 18% Goal status: IN PROGRESS 07/05/24  8%   3.  Patient will increase right hip strength to [facility] to allow him to navigate stairs with reciprocal pattern without difficulty/discomfort. Baseline:  Goal status: MET 07/05/24  4.  Patient will increase bilateral single leg stance to greater than 5 seconds to decrease patients fall risk. Baseline: right- 1.53 sec, left- 1.40 sec Goal status: IN  PROGRESS 09/15/24 MODIFIED TO 5 SECONDS  5.  Patient will report ability to ambulate over various unlevel surfaces for desired length (at least greater than 30 minutes) without a loss of balance to allow patient to travel and sight-see. Baseline:  Goal status: IN PROGRESS 10/17  6 Patient will improve dynamic balance by using an ankle strategy in order to reduce loss of balance in the posterior direction Baseline:  Goal status: IN PROGRESS 10/17  7. Improved mini- best score to 23/28 indicating improved dynamic balance and decreased risk of falls  Baseline: 17/28 Goal status: IN PROGRESS 10/17  20/28  PLAN:  PT FREQUENCY: 2x/week  PT DURATION: 12 weeks  (pt will be traveling for a few weeks in September)  PLANNED INTERVENTIONS: 02835- PT Re-evaluation, 97750- Physical Performance Testing, 97110-Therapeutic exercises, 97530- Therapeutic activity, [id number]- Neuromuscular re-education, [id number]- Self Care, 02859- Manual therapy, [id number]- Gait training, [phone] Canalith repositioning, [id number]- Aquatic Therapy, [id number]- Electrical stimulation (unattended), [id number]- Electrical stimulation (manual), [id number]- Ultrasound, [id number]- Traction (mechanical), [id number]- Ionotophoresis 4mg /ml Dexamethasone , 79439 (1-2  muscles), 20561 (3+ muscles)- Dry Needling, Patient/Family education, Balance training, Stair training, Taping, Joint mobilization, Joint manipulation, Spinal manipulation, Spinal mobilization, Cryotherapy, and Moist heat.  PLAN FOR NEXT SESSION: continue with prolonged and dynamic SLS, agility ladder with bwd activities, and crossing midline activities, eccentric quad strengthening  Mliss Cummins, PT  09/27/24 5:10 PM [facility] [address] [place], [place] [address] Phone # [phone] Fax [phone]

## 2024-10-01 NOTE — Therapy (Signed)
 OUTPATIENT PHYSICAL THERAPY TREATMENT     Patient Name: [name] MRN: [id number] DOB:[birth year], 78 y.o., male Today's Date: 10/02/2024  END OF SESSION:  PT End of Session - 10/02/24 1236     Visit Number 23    Date for Recertification  10/11/24    Authorization Type [employer] MCR - re-auth submitted 10/20 to 12/8    Authorization Time Period 07/20/24 to 10/11/24    Authorization - Visit Number 14    Authorization - Number of Visits 18    Progress Note Due on Visit 28    PT Start Time 1236    PT Stop Time 1325    PT Time Calculation (min) 49 min    Activity Tolerance Patient tolerated treatment well    Behavior During Therapy [facility] for tasks assessed/performed                          Past Medical History:  Diagnosis Date   Abscess    right posterior neck   Anticoagulation adequate 06/07/2019   Overview:  SSS, paroxAFib/Flutter, CHADSvasc= 3 age, h/o cva; chronic OAC Xarelto    Atrial fibrillation and flutter (HCC) 01/01/2015   Noted 2016. Xarelto     BPH associated with nocturia 08/14/2014   Finasteride , flomax .  0-3x nocturia. PSA to 14-->eval urology [facility]. PSA trended back down to about 2.6 per records in 2015 then placed on finasteride - had significant voiding issues when spiked to 14- improved on meds    Bradycardia    CAD (coronary artery disease) 05/26/2016   Coronary CTA_ 50-75% D1 off CT report    Cardiac pacemaker in situ 12/28/2009   SA node dysfunction as cause    Chest pressure 12/01/2015   Overview:  atypical CP, [facility] nuclear perfusion imaging w/ no inarct, no ischemia, EF-40% NICM   Chronic systolic heart failure (HCC) 12/01/2015   Overview:  [facility] nuclear perfusion imaging study, no inarct, no ischemia, EF-40% NICM   CVA (cerebral vascular accident) (HCC)    Double vision 07/04/2018   Elevated LFTs    Erectile dysfunction 01/30/2015   cialis  prn    External hemorrhoids    Fitting or adjustment of cardiac pacemaker 06/07/2019   Overview:  [place]  [place] DR [id number], RA and RV leads [id number]:  [id number] and [id number]   Former smoker 01/30/2015   Former smoker. 20 pack years quit 92. Patient declined AAA screen.     GERD (gastroesophageal reflux disease)    Gout 11/11/2009   Qualifier: Diagnosis of  By: Gardenia Pao     History of nuclear stress test 06/07/2019   Overview:  [facility], no inarct, no ischemia, EF-40% NICM   HTN (hypertension)    Hyperglycemia 06/10/2017   110s CBG 2018   Hyperlipidemia 07/12/2007   Crestor  20mg  daily with some myalgias; fish oil. Trig 200-500 despite this. LDL ok.        Insomnia 01/30/2015   ambien  5mg  prn. May refill.     Ischemic optic neuropathy    Left chest pressure 03/19/2015   Other and unspecified hyperlipidemia    Sinoatrial node dysfunction (HCC)    Spinal stenosis    Trigger finger, right middle finger 03/01/2018   Past Surgical History:  Procedure Laterality Date   ABSCESS DRAINAGE     righ tposterior neck   CARDIAC CATHETERIZATION  1996   CHOLECYSTECTOMY  02/07/2003   COLONOSCOPY  06/23/2004   EYE SURGERY     left eye   left  ingunial hernia  06/08/2011   PACEMAKER INSERTION     PPM GENERATOR CHANGEOUT N/A 07/12/2024   Procedure: PPM GENERATOR CHANGEOUT;  Surgeon: Waddell Danelle ORN, MD;  Location: [facility] INVASIVE CV LAB;  Service: Cardiovascular;  Laterality: N/A;   right hip replacement     2012 Dr. Hiram   Patient Active Problem List   Diagnosis Date Noted   Aortic atherosclerosis 08/01/2020   Fatty liver 08/01/2020   Paroxysmal atrial fibrillation (HCC) 07/27/2019   Anticoagulation adequate 06/07/2019   Convergence insufficiency 06/07/2019   Fitting or adjustment of cardiac pacemaker 06/07/2019   Pacemaker-dependent due to native cardiac rhythm insufficient to support life 06/07/2019   Palpitations 06/07/2019   History of nuclear stress test 06/07/2019   S/P cardiac cath 06/07/2019   Non-occlusive coronary artery disease 06/07/2019   Double vision 07/04/2018   Trigger finger,  right middle finger 03/01/2018   Hyperglycemia 06/10/2017   Solitary pulmonary nodule 05/26/2016   CAD (coronary artery disease) 05/26/2016   Nonischemic cardiomyopathy (HCC) 04/23/2016   Chronic systolic heart failure (HCC) 12/01/2015   Chest pressure 12/01/2015   Left chest pressure 03/19/2015   Insomnia 01/30/2015   Erectile dysfunction 01/30/2015   Cerebral infarction (HCC) 01/30/2015   Former smoker 01/30/2015   Atrial fibrillation and flutter (HCC) 01/01/2015   Allergic rhinitis 10/11/2014   BPH associated with nocturia 08/14/2014   Ventricular tachycardia, non-sustained (HCC) 01/25/2013   Sinoatrial node dysfunction (HCC)    SPINAL STENOSIS, LUMBAR 08/22/2010   GERD 03/17/2010   Cardiac pacemaker in situ 12/28/2009   Gout 11/11/2009   Hyperlipidemia 07/12/2007   NEUROPATHY, ISCHEMIC OPTIC 06/02/2007   Essential hypertension 06/02/2007    PCP: Nathan Garnette KIDD., MD  REFERRING PROVIDER: [name], DO  REFERRING DIAG: M54.42,M54.41,G89.29 (ICD-10-CM) - Chronic bilateral low back pain with bilateral sciatica M51.362 (ICD-10-CM) - Degeneration of intervertebral disc of lumbar region with discogenic back pain and lower extremity pain  Rationale for Evaluation and Treatment: Rehabilitation  THERAPY DIAG:  Other abnormalities of gait and mobility  Other low back pain  Difficulty in walking, not elsewhere classified  Muscle weakness (generalized)  Balance disorder  Cramp and spasm  Abnormal posture  ONSET DATE: February 2024 first visit to Dr Nathan  SUBJECTIVE:                                                                                                                                                                                           SUBJECTIVE STATEMENT: I'm doing terrible in my yoga class.   PERTINENT HISTORY:  [name] frequently (next upcoming trip 7/14 for a [place] cruise) OA, trigger fingers, right hip  arthroplasty, pacemaker CVA 30  years ago affecting L side  PAIN:  07/27/24 Are you having pain? Yes: NPRS scale: 2/10 Pain location: low back Pain description: discomfort Aggravating factors: worst first thing in the morning or after lying on his couch, swimming Relieving factors: medication, movement  PRECAUTIONS: ICD/Pacemaker  RED FLAGS: None   WEIGHT BEARING RESTRICTIONS: No  FALLS:  Has patient fallen in last 6 months? Yes. Number of falls 1 fall.  Tripped over a chair while traveling at [place]  LIVING ENVIRONMENT: Lives with: lives with their spouse Lives in: House/apartment Stairs: one story Has following equipment at home: None  OCCUPATION: Retired  PLOF: Independent and Leisure: traveling (many upcoming trips planned), swimming  PATIENT GOALS: To feel better and not have to be reliant on pain medication as much and improve balance and flexibility.  NEXT MD VISIT: Follow up with Dr Nathan as needed  OBJECTIVE:  Note: Objective measures were completed at Evaluation unless otherwise noted.  DIAGNOSTIC FINDINGS:  Lumbar CT Scan on 02/04/2023: IMPRESSION: Multilevel degenerative changes which are most severe at the L3-4 level with significant spinal stenosis. No acute traumatic abnormalities identified.  PATIENT SURVEYS:  Modified Oswestry:  MODIFIED OSWESTRY DISABILITY SCALE   Date: 05/26/2024 Score  Pain intensity 2 =  Pain medication provides me with complete relief from pain.  2. Personal care (washing, dressing, etc.) 0 =  I can take care of myself normally without causing increased pain.  3. Lifting 2 = Pain prevents me from lifting heavy weights off the floor, activities (eg. sports, dancing). but I can manage if the weights are conveniently positioned (3) Pain prevents me from going out very often. (eg, on a table).  4. Walking 0 = Pain does not prevent me from walking any distance  5. Sitting 0 =  I can sit in any chair as long as I like.  6. Standing 1 =  I can stand as long as I want  but, it increases my pain.  7. Sleeping 1 = I can sleep well only by using pain medication.  8. Social Life 0 = My social life is normal and does not increase my pain.  9. Traveling 2 =  My pain restricts my travel over 2 hours.  10. Employment/ Homemaking 1 = My normal homemaking/job activities increase my pain, but I can still perform all that is required of me  Total 9 / 50 = 18.0 %   Interpretation of scores: Score Category Description  0-20% Minimal Disability The patient can cope with most living activities. Usually no treatment is indicated apart from advice on lifting, sitting and exercise  21-40% Moderate Disability The patient experiences more pain and difficulty with sitting, lifting and standing. Travel and social life are more difficult and they may be disabled from work. Personal care, sexual activity and sleeping are not grossly affected, and the patient can usually be managed by conservative means  41-60% Severe Disability Pain remains the main problem in this group, but activities of daily living are affected. These patients require a detailed investigation  61-80% Crippled Back pain impinges on all aspects of the patient's life. Positive intervention is required  81-100% Bed-bound  These patients are either bed-bound or exaggerating their symptoms  Bluford FORBES Zoe DELENA Karon DELENA, et al. Surgery versus conservative management of stable thoracolumbar fracture: the PRESTO feasibility RCT. [place] ([place]): [employer]; 2021 Nov. [facility] Technology Assessment, No. 25.62.) Appendix 3, Oswestry Disability Index category descriptors. Available from: Findjewelers.cz  Minimally Clinically Important Difference (MCID) = 12.8%   8/6/2 ODI 4/50 = 8%  COGNITION: Overall cognitive status: Within functional limits for tasks assessed     SENSATION: Patient states that he has a neuropathy problem in his feet, but it has improved since starting  Gabapentin   MUSCLE LENGTH: Hamstrings: increased tightness bilaterally Piriformis:  tightness bilat  POSTURE: rounded shoulders and forward head   LUMBAR ROM:   Eval:  Decreased by approx 50%  LOWER EXTREMITY ROM:     WFL  LOWER EXTREMITY MMT:    Eval:   LE grossly WNL, but grossly 4+ to 5-/5 in right hip Core strength grossly 4-/5 throughout  LUMBAR SPECIAL TESTS:  Slump test: Negative  FUNCTIONAL TESTS:  Eval: 5 times sit to stand: 11.87 sec Single Leg Stance:  right- 1.53 sec, left- 1.40 sec  09/04/24  SLS R 4 sec; L 5 sec 09/13/24 SLS R 4 sec; L 6 sec 09/27/24  SLS R 5 sec; L 10 sec  GAIT: Distance walked: >500 ft Assistive device utilized: None Level of assistance: Complete Independence Comments: States that he has more difficulty with balance on unlevel surfaces (like grass) than firm surfaces.     MINI-BESTest: Balance Evaluation Systems Test ANTICIPATORY: SIT TO STAND: (2) normal without use of hands and stabilizes independently     (1) Moderate comes to stand WITH use of hands on 1st attempt    (0) Severe: unable to stand up from chair without assistance OR needs several attempts with use of   hands Score:  2 07/19/24, 2 09/15/24  2. RISE TO TOES: feet shoulder width apart. Hands on hips.  Rise as high as you can onto your toes. Try to hold this pose for 3 sec                          (2) stable for 3s with maximum height    (1) heels up, but not full range (smaller than when holding hands) OR noticeable instability for 3 sec                           (0) < or equat to 3 s  Score:  1 07/19/24, 2 09/15/24  3. STAND ON ONE LEG: look straight ahead. Hands on hips. Lift your leg off the ground.     Left:  trial 1 __3__ trial 2__6_                                    Right: Trial 1:___1   Trial 2:____3___   (2) 20 s       (2)  20 s                (1) < 20      (1) < 20 s   (0) Unable      (0) unable Score (use the worst side):  1 07/19/24; 1  09/15/24  REACTIVE POSTURAL CONTROL 4. COMPENSATORY STEPPING CORRECTION FORWARD stand with feet shoulder width apart, arms at your sides. Lean forward against my hands beyond your forward limits.  When I let go, do whatever is necessary, including taking a step, to avoid a fall   (2) recovers independently  with a single, large step, to avoid a fall   (1) more than one step used to recover equilibrium   (  0) No step OR would fall if not caught Score:  0 07/19/24; 2 10/17  5. COMPENSATORY STEPPING CORRECTION BACKWARD: Lean backward against my hands beyond your backward limits.  When I let go, do whatever is necessary, including a step to avoid a fall. (2) recovers independently  with a single, large step, to avoid a fall   (1) more than one step used to recover equilibrium   (0) No step OR would fall if not caught Score:  0  07/19/24, 1 09/15/24  6. COMPENSATORY STEPPING LATERAL: Lean into my hand beyond your sideways limit.  When I let go, do whatever is necessary, including taking a step, to avoid a fall. Left           Right   (2) recovers independently with 1 step (crossover or lateral OK)   (1) several steps to recover equilibrium   (0) falls or cannot step Score:     0  (use the side with the lowest score) at eval;  L  and R 2  09/15/24  SENSORY ORIENTATION 7. STANCE FEET TOGETHER EYES OPEN  firm surface Be as stable and still as possible until I say stop   (2) 30s   (1) < 30 s   (0) unable Score:  2 07/19/24 and 09/15/24  8. STANCE ON FOAM EYES CLOSED feet together, eyes closed, hands on hips. Be as stable and still as you can until I stay stop.  I will start timing when you close your eyes. Time in seconds:      (2) 30s   (1) <30 s   (0) unable Score:  1 at eval and 09/15/24  9. INCLINE EYES CLOSED: Stand on incline with feet shoulder width apart, arms down at your sides.  I will start timing when you close your eyes   (2) Stands 30 s and aligns with gravity   (1) stands < 30  s OR aligns with surface   (0) unable Score:  1 07/19/24 and 09/15/24  DYNAMIC GAIT 10. CHANGE IN GAIT SPEED: begin walking at your normal speed, when I tell you fast, walk as fast as you can, when I say slow walk very slowly   (2) significantly changes walking speed without imbalance   (1) unable to change walking speed or signs of imbalance   (0) unable to achieve significant change in walking speed AND signes of imbalance Score: 2 07/19/24 and 09/15/24  11. WALK WITH HEAD TURNS HORIZONTAL: begin walking at normal speed, when I say right, turn your head to the right, when I say left turn your head to the left.  Try to keep yourself walking in a straight line   (2) performs head turns with no change in gait speed and good balance   (1) performs head turns with reduction in gait speed   (0)  performs head turns with imbalance Score:  2 07/19/24 0 09/15/24  12. WALK WITH PIVOT TURNS: begin walking at your normal speed.  When I tell you to turn and stop, turn as quickly as you can, face the opposite direction and stop.  After the turn, your feet should be close together   (2) turns with feet close FAST in 3 steps with good balance   (1) turns with feet close SLOW > 4 steps with good balance   (0) cannot turn with feet close at any speed without imbalance Score:2 07/19/24 and 10/17  13. STEP OVER OBSTACLES:  ( 9 inch height  10 feet away from subject) begin walking at your normal speed.  When you get to the box, step over it, not around it and keep walking   (2) Able to step over box with minimal change of gait speed and good balance   (1) Steps over box but touches box OR displays cautious behavior by slowing gait   (0) Unable to step over box OR steps around box Score:  2 07/19/24 and 10/17  14. TIMED UP AND GO NORMAL AND WITH DUAL TASK: 3 METER WALK: When I say go walk at your your normal speed across the tape, turn around and come back to sit in the chair   Time: Count backwards by 3s  starting at    20.  When I say go, stand up from the chair, walk at your normal speed across the tape on the floor, turn around, come back to sit in the chair.  Continue counting backwards the entire time.  Time:   (2) No noticeable change in sitting, standing or walking while backward counting when compared to TUG without dual task   (1) Dual task affects either counting OR walking (>10%)    (0) stops counting while walking OR stops walking while counting If gait speed slows more than 10% between TUG without and with dual task, the score should be decreased by a point Score: 1 8/20/252 10/17   Total score:       17    /28  on 07/19/24    20/28 on 10 /17/ 25      TREATMENT DATE:  10/02/24(SBA/CGA at all times) Vibration plate 6k69 sec with squats for calf stretch Farmer carry 15# with marching - prolonged stance time x 30 ft B At wall: hip hinge to work on post hip strategy, back to wall OH reach pushing hips forward to work on ant hip strategy, Forward lunge in II bars with push off to stand max reps  Step back lunges in II bars with push off to fatigue Lateral lunges in II bars with push off  Foam beam in II bars: side stepping - very challenging II bars: braiding front, back and alternating II bars: front to back touches II bars: SLS  L max 10 sec, R max 5 sec Sit to stands 5# x 10, squat to chair with 5# x 10 Agility ladder - multi directional Walking on cobblestone pads    09/27/24(SBA/CGA at all times) Vibration plate 6k69 sec with squats for calf stretch Farmer carry 15# with marching - prolonged stance time x 30 ft B At wall: hip hinge to work on post hip strategy, back to wall OH reach pushing hips forward to work on ant hip strategy, lateral SB to wall pushing weight through outside foot Forward lunge in II bars with push off to stand max reps  Step back lunges in II bars with push off to fatigue Lateral lunges in II bars with push off  Foam beam in II bars: side  stepping - very challenging II bars: braiding front, back and alternating II bars: front to back touches II bars: SLS  L max 10 sec, R max 5 sec Sit to stands 5# x 5, squat to chair with 5# x 10   09/25/24(SBA/CGA at all times) Vibration plate 6k69 sec with squats Farmer carry unilaterally 15# KB hallway Farmer carry 15# with marching - prolonged stance  Step back lunges x 10 Lateral lunges x 10  Toe taps fwd and back crossing  midline - also die front taps to lateral x 10 ea; back taps to lateral x 10 Standing dynamic HS stretch B at barre Melt balls to bottom of feet to increase mobility  Manual: R ankle PROM with distraction; STM to extensor digitorum brevis; calcaneal mobs    09/20/24(SBA/CGA at all times) Aura carry unilaterally 15# KB around cancer gym Farmer carry 8# overhead x 2 hallway B 30# carry x 1 hall way B Step back lunges x 10 Lateral lunges x 10  Heel/toe walking  4 laps at barre ea Reach behinds to counter in different stance positions Hurdles with 3 sec knee raises fwd multiple reps  Sit to stands x 5, 3 with narrow base, 8# x 8 Toe taps fwd and back crossing midline - max 2  Step ups 20# KB onto aerobic step + 1 riser x 10 B    PATIENT EDUCATION:  Education details: Issued HEP Person educated: Patient Education method: Explanation, Demonstration, and Handouts Education comprehension: verbalized understanding and returned demonstration  HOME EXERCISE PROGRAM: Access Code: [facility] URL: [email or web address] Date: 07/25/2024 Prepared by: Mliss  Exercises - Seated Transversus Abdominis Bracing  - 1 x daily - 7 x weekly - 2 sets - 10 reps - Seated Hamstring Stretch  - 1 x daily - 7 x weekly - 2 reps - 20 sec hold - Seated Piriformis Stretch  - 1 x daily - 7 x weekly - 2 reps - 20 sec hold - Single Leg Stance with Support  - 1 x daily - 7 x weekly - 2 reps - 20 sec hold - Shoulder External Rotation and Scapular Retraction with  Resistance  - 1 x daily - 7 x weekly - 2 sets - 10 reps - Standing Shoulder Horizontal Abduction with Resistance  - 1 x daily - 7 x weekly - 2 sets - 10 reps - Standing Shoulder Row with Anchored Resistance  - 1 x daily - 7 x weekly - 2 sets - 10 reps - Shoulder extension with resistance - Neutral  - 1 x daily - 7 x weekly - 2 sets - 10 reps - Single Leg Balance with Clock Reach  - 1 x daily - 7 x weekly - 10 reps - Standing 'L' Stretch at Counter  - 1 x daily - 7 x weekly - 2 reps - 20 sec hold - Resisted Sit-to-Stand With Dumbbell at Chest  - 1 x daily - 3 x weekly - 2 sets - 10 reps - Side Step Overs with Cones and Unilateral Counter Support  - 1 x daily - 3 x weekly - 2 sets - 10 reps - Standing 3-Way Leg Reach with Resistance at Ankles and Counter Support  - 1 x daily - 3 x weekly - 2 sets - 10 reps - Side Stepping with Resistance at Ankles  - 1 x daily - 3 x weekly - 2 sets - 10 reps - Forward Monster Walk with Resistance at Ankles and Counter Support  - 1 x daily - 3 x weekly - 2 sets - 10 reps - Squat with Chair Touch  - 1 x daily - 3 x weekly - 2 sets - 10 reps - Kneeling Plantarflexion Stretch  - 1 x daily - 3 x weekly - 1 sets - 3 reps - 30 sec hold - Supine Ankle Dorsiflexion and Plantarflexion AROM  - 1 x daily - 7 x weekly - 2 sets - 10 reps - Supine Ankle Inversion and Eversion AROM  -  1 x daily - 7 x weekly - 2 sets - 10 reps - Supine Ankle Circles  - 1 x daily - 7 x weekly - 2 sets - 10 reps - Seated Ankle Alphabet  - 1 x daily - 7 x weekly - 1 sets - 1 reps  ASSESSMENT:  CLINICAL IMPRESSION: Patient demonstrates increased balance overall with activities. He continues to need to focus on the activity first and then progresses as time or reps increase. His SLS today improved beyond 5 sec Bilaterally, however he requires multiple reps to achieve this. Much improved balance with agility ladder tasks today. He also generally has improved with his hip and stepping strategies. Patient  feels he has reached his max potential with rehab in regards to balance. After discussion, it was decided to d/c pt after next visit. He is pleased with the gains he has made.     OBJECTIVE IMPAIRMENTS: decreased balance, decreased strength, increased muscle spasms, impaired flexibility, postural dysfunction, and pain.   ACTIVITY LIMITATIONS: lifting, standing, and stairs  PARTICIPATION LIMITATIONS: driving, community activity, and yard work  PERSONAL FACTORS: Time since onset of injury/illness/exacerbation and 1-2 comorbidities: OA, right hip arthroplasty are also affecting patient's functional outcome.   REHAB POTENTIAL: Good  CLINICAL DECISION MAKING: Stable/uncomplicated  EVALUATION COMPLEXITY: Low   GOALS: Goals reviewed with patient? Yes  SHORT TERM GOALS: Target date: 06/16/2024  Patient will be independent with initial HEP. Baseline: Goal status: Met on 05/31/24  2.  Patient will demonstrate and verbalize knowledge of proper lifting techniques to allow him to lift suitcases for travel. Baseline:  Goal status: MET   LONG TERM GOALS: Target date: 10/11/2024    Patient will be independent with advanced HEP to allow for self progression after discharge. Baseline:  Goal status: IN PROGRESS 10/17  2.  Patient will improve modified Oswestry to not greater than 5% to demonstrate improved functional mobility. Baseline: 18% Goal status: IN PROGRESS 07/05/24  8%   3.  Patient will increase right hip strength to [facility] to allow him to navigate stairs with reciprocal pattern without difficulty/discomfort. Baseline:  Goal status: MET 07/05/24  4.  Patient will increase bilateral single leg stance to greater than 5 seconds to decrease patients fall risk. Baseline: right- 1.53 sec, left- 1.40 sec Goal status: IN PROGRESS 09/15/24 MODIFIED TO 5 SECONDS  5.  Patient will report ability to ambulate over various unlevel surfaces for desired length (at least greater than 30 minutes)  without a loss of balance to allow patient to travel and sight-see. Baseline:  Goal status: IN PROGRESS 10/17  6 Patient will improve dynamic balance by using an ankle strategy in order to reduce loss of balance in the posterior direction Baseline:  Goal status: IN PROGRESS 10/17  7. Improved mini- best score to 23/28 indicating improved dynamic balance and decreased risk of falls  Baseline: 17/28 Goal status: IN PROGRESS 10/17  20/28  PLAN:  PT FREQUENCY: 2x/week  PT DURATION: 12 weeks  (pt will be traveling for a few weeks in September)  PLANNED INTERVENTIONS: 02835- PT Re-evaluation, 97750- Physical Performance Testing, 97110-Therapeutic exercises, 97530- Therapeutic activity, [id number]- Neuromuscular re-education, 97535- Self Care, [id number]- Manual therapy, [id number]- Gait training, [phone]- Canalith repositioning, [id number]- Aquatic Therapy, [phone]- Electrical stimulation (unattended), [id number]- Electrical stimulation (manual), [id number]- Ultrasound, [id number]- Traction (mechanical), [id number]- Ionotophoresis 4mg /ml Dexamethasone , 79439 (1-2 muscles), 20561 (3+ muscles)- Dry Needling, Patient/Family education, Balance training, Stair training, Taping, Joint mobilization, Joint manipulation, Spinal manipulation, Spinal mobilization, Cryotherapy, and Moist heat.  PLAN FOR NEXT SESSION: D/C next visit  Mliss Cummins, PT  10/02/24 1:55 PM [facility] [address], [address] [place], [place] [address] Phone # [phone] Fax [phone]

## 2024-10-02 ENCOUNTER — Ambulatory Visit: Attending: Sports Medicine | Admitting: Physical Therapy

## 2024-10-02 ENCOUNTER — Encounter: Payer: Self-pay | Admitting: Physical Therapy

## 2024-10-02 DIAGNOSIS — M5459 Other low back pain: Secondary | ICD-10-CM | POA: Diagnosis not present

## 2024-10-02 DIAGNOSIS — R293 Abnormal posture: Secondary | ICD-10-CM | POA: Diagnosis not present

## 2024-10-02 DIAGNOSIS — M6281 Muscle weakness (generalized): Secondary | ICD-10-CM | POA: Diagnosis not present

## 2024-10-02 DIAGNOSIS — R262 Difficulty in walking, not elsewhere classified: Secondary | ICD-10-CM | POA: Insufficient documentation

## 2024-10-02 DIAGNOSIS — R252 Cramp and spasm: Secondary | ICD-10-CM | POA: Insufficient documentation

## 2024-10-02 DIAGNOSIS — R2689 Other abnormalities of gait and mobility: Secondary | ICD-10-CM | POA: Diagnosis not present

## 2024-10-04 ENCOUNTER — Ambulatory Visit: Admitting: Physical Therapy

## 2024-10-04 DIAGNOSIS — M6281 Muscle weakness (generalized): Secondary | ICD-10-CM

## 2024-10-04 DIAGNOSIS — R293 Abnormal posture: Secondary | ICD-10-CM | POA: Diagnosis not present

## 2024-10-04 DIAGNOSIS — M5459 Other low back pain: Secondary | ICD-10-CM

## 2024-10-04 DIAGNOSIS — R262 Difficulty in walking, not elsewhere classified: Secondary | ICD-10-CM | POA: Diagnosis not present

## 2024-10-04 DIAGNOSIS — R2689 Other abnormalities of gait and mobility: Secondary | ICD-10-CM

## 2024-10-04 DIAGNOSIS — R252 Cramp and spasm: Secondary | ICD-10-CM | POA: Diagnosis not present

## 2024-10-04 NOTE — Therapy (Signed)
 OUTPATIENT PHYSICAL THERAPY TREATMENT AND DISCHARGE SUMMARY     Patient Name: [name] MRN: [id number] DOB:May 27, 1946, 78 y.o., male Today's Date: 10/04/2024  END OF SESSION:  PT End of Session - 10/04/24 1534     Visit Number 24    Date for Recertification  10/11/24    Authorization Type [employer] MCR - re-auth submitted 10/20 to 12/8    Authorization - Visit Number 15    Progress Note Due on Visit 28    PT Start Time 1534    PT Stop Time 1615    PT Time Calculation (min) 41 min    Activity Tolerance Patient tolerated treatment well    Behavior During Therapy [name] for tasks assessed/performed                           Past Medical History:  Diagnosis Date   Abscess    right posterior neck   Anticoagulation adequate 06/07/2019   Overview:  SSS, paroxAFib/Flutter, CHADSvasc= 3 age, h/o cva; chronic OAC Xarelto    Atrial fibrillation and flutter (HCC) 01/01/2015   Noted 2016. Xarelto     BPH associated with nocturia 08/14/2014   Finasteride , flomax .  0-3x nocturia. PSA to 14-->eval urology [facility]. PSA trended back down to about 2.6 per records in 2015 then placed on finasteride - had significant voiding issues when spiked to 14- improved on meds    Bradycardia    CAD (coronary artery disease) 05/26/2016   Coronary CTA_ 50-75% D1 off CT report    Cardiac pacemaker in situ 12/28/2009   SA node dysfunction as cause    Chest pressure 12/01/2015   Overview:  atypical CP, [facility] nuclear perfusion imaging w/ no inarct, no ischemia, EF-40% NICM   Chronic systolic heart failure (HCC) 12/01/2015   Overview:  [facility] nuclear perfusion imaging study, no inarct, no ischemia, EF-40% NICM   CVA (cerebral vascular accident) (HCC)    Double vision 07/04/2018   Elevated LFTs    Erectile dysfunction 01/30/2015   cialis  prn    External hemorrhoids    Fitting or adjustment of cardiac pacemaker 06/07/2019   Overview:  Biotronik Wisner DR [id number], RA and RV leads [id number]:  [id number] and  [id number]   Former smoker 01/30/2015   Former smoker. 20 pack years quit 92. Patient declined AAA screen.     GERD (gastroesophageal reflux disease)    Gout 11/11/2009   Qualifier: Diagnosis of  By: [name]     History of nuclear stress test 06/07/2019   Overview:  [facility], no inarct, no ischemia, EF-40% NICM   HTN (hypertension)    Hyperglycemia 06/10/2017   110s CBG 2018   Hyperlipidemia 07/12/2007   Crestor  20mg  daily with some myalgias; fish oil. Trig 200-500 despite this. LDL ok.        Insomnia 01/30/2015   ambien  5mg  prn. May refill.     Ischemic optic neuropathy    Left chest pressure 03/19/2015   Other and unspecified hyperlipidemia    Sinoatrial node dysfunction (HCC)    Spinal stenosis    Trigger finger, right middle finger 03/01/2018   Past Surgical History:  Procedure Laterality Date   ABSCESS DRAINAGE     righ tposterior neck   CARDIAC CATHETERIZATION  1996   CHOLECYSTECTOMY  02/07/2003   COLONOSCOPY  06/23/2004   EYE Higgins     left eye   left ingunial hernia  06/08/2011   PACEMAKER INSERTION     PPM GENERATOR  CHANGEOUT N/A 07/12/2024   Procedure: PPM GENERATOR CHANGEOUT;  Surgeon: Nathan Danelle ORN, MD;  Location: [facility] INVASIVE CV LAB;  Service: Cardiovascular;  Laterality: N/A;   right hip replacement     2012 Dr. Hiram   Patient Active Problem List   Diagnosis Date Noted   Aortic atherosclerosis 08/01/2020   Fatty liver 08/01/2020   Paroxysmal atrial fibrillation (HCC) 07/27/2019   Anticoagulation adequate 06/07/2019   Convergence insufficiency 06/07/2019   Fitting or adjustment of cardiac pacemaker 06/07/2019   Pacemaker-dependent due to native cardiac rhythm insufficient to support life 06/07/2019   Palpitations 06/07/2019   History of nuclear stress test 06/07/2019   S/P cardiac cath 06/07/2019   Non-occlusive coronary artery disease 06/07/2019   Double vision 07/04/2018   Trigger finger, right middle finger 03/01/2018   Hyperglycemia 06/10/2017    Solitary pulmonary nodule 05/26/2016   CAD (coronary artery disease) 05/26/2016   Nonischemic cardiomyopathy (HCC) 04/23/2016   Chronic systolic heart failure (HCC) 12/01/2015   Chest pressure 12/01/2015   Left chest pressure 03/19/2015   Insomnia 01/30/2015   Erectile dysfunction 01/30/2015   Cerebral infarction (HCC) 01/30/2015   Former smoker 01/30/2015   Atrial fibrillation and flutter (HCC) 01/01/2015   Allergic rhinitis 10/11/2014   BPH associated with nocturia 08/14/2014   Ventricular tachycardia, non-sustained (HCC) 01/25/2013   Sinoatrial node dysfunction (HCC)    SPINAL STENOSIS, LUMBAR 08/22/2010   GERD 03/17/2010   Cardiac pacemaker in situ 12/28/2009   Gout 11/11/2009   Hyperlipidemia 07/12/2007   NEUROPATHY, ISCHEMIC OPTIC 06/02/2007   Essential hypertension 06/02/2007    PCP: Nathan Garnette KIDD., MD  REFERRING PROVIDER: [name], DO  REFERRING DIAG: M54.42,M54.41,G89.29 (ICD-10-CM) - Chronic bilateral low back pain with bilateral sciatica M51.362 (ICD-10-CM) - Degeneration of intervertebral disc of lumbar region with discogenic back pain and lower extremity pain  Rationale for Evaluation and Treatment: Rehabilitation  THERAPY DIAG:  Other abnormalities of gait and mobility  Other low back pain  Difficulty in walking, not elsewhere classified  Muscle weakness (generalized)  Balance disorder  ONSET DATE: February 2024 first visit to Dr Nathan  SUBJECTIVE:                                                                                                                                                                                           SUBJECTIVE STATEMENT: [facility] reports [facility].    PERTINENT HISTORY:  [name] frequently (next upcoming trip 7/14 for a [place] cruise) OA, trigger fingers, right hip arthroplasty, pacemaker CVA 30 years ago affecting L side  PAIN:  Are you having pain?  Yes: NPRS scale: 2/10 Pain location: low back Pain description: discomfort Aggravating factors: worst first thing in the morning or after lying on his couch, swimming Relieving factors: medication, movement  PRECAUTIONS: ICD/Pacemaker  RED FLAGS: None   WEIGHT BEARING RESTRICTIONS: No  FALLS:  Has patient fallen in last 6 months? Yes. Number of falls 1 fall.  Tripped over a chair while traveling at [place]  LIVING ENVIRONMENT: Lives with: lives with their spouse Lives in: House/apartment Stairs: one story Has following equipment at home: None  OCCUPATION: Retired  PLOF: Independent and Leisure: traveling (many upcoming trips planned), swimming  PATIENT GOALS: To feel better and not have to be reliant on pain medication as much and improve balance and flexibility.  NEXT MD VISIT: Follow up with Dr Nathan as needed  OBJECTIVE:  Note: Objective measures were completed at Evaluation unless otherwise noted.  DIAGNOSTIC FINDINGS:  Lumbar CT Scan on 02/04/2023: IMPRESSION: Multilevel degenerative changes which are most severe at the L3-4 level with significant spinal stenosis. No acute traumatic abnormalities identified.  PATIENT SURVEYS:  Modified Oswestry:  MODIFIED OSWESTRY DISABILITY SCALE   Date: 05/26/2024 Score  Pain intensity 2 =  Pain medication provides me with complete relief from pain.  2. Personal care (washing, dressing, etc.) 0 =  I can take care of myself normally without causing increased pain.  3. Lifting 2 = Pain prevents me from lifting heavy weights off the floor, activities (eg. sports, dancing). but I can manage if the weights are conveniently positioned (3) Pain prevents me from going out very often. (eg, on a table).  4. Walking 0 = Pain does not prevent me from walking any distance  5. Sitting 0 =  I can sit in any chair as long as I like.  6. Standing 1 =  I can stand as long as I want but, it increases my pain.  7. Sleeping 1 = I can sleep well only  by using pain medication.  8. Social Life 0 = My social life is normal and does not increase my pain.  9. Traveling 2 =  My pain restricts my travel over 2 hours.  10. Employment/ Homemaking 1 = My normal homemaking/job activities increase my pain, but I can still perform all that is required of me  Total 9 / 50 = 18.0 %   Interpretation of scores: Score Category Description  0-20% Minimal Disability The patient can cope with most living activities. Usually no treatment is indicated apart from advice on lifting, sitting and exercise  21-40% Moderate Disability The patient experiences more pain and difficulty with sitting, lifting and standing. Travel and social life are more difficult and they may be disabled from work. Personal care, sexual activity and sleeping are not grossly affected, and the patient can usually be managed by conservative means  41-60% Severe Disability Pain remains the main problem in this group, but activities of daily living are affected. These patients require a detailed investigation  61-80% Crippled Back pain impinges on all aspects of the patient's life. Positive intervention is required  81-100% Bed-bound  These patients are either bed-bound or exaggerating their symptoms  Bluford FORBES Zoe DELENA Karon DELENA, et al. Higgins versus conservative management of stable thoracolumbar fracture: the PRESTO feasibility RCT. [place] ([place]): [employer]; 2021 Nov. [facility] Technology Assessment, No. 25.62.) Appendix 3, Oswestry Disability Index category descriptors. Available from: Findjewelers.cz  Minimally Clinically Important Difference (MCID) = 12.8%   8/6/2 ODI 4/50 =  8%  COGNITION: Overall cognitive status: Within functional limits for tasks assessed     SENSATION: Patient states that he has a neuropathy problem in his feet, but it has improved since starting Gabapentin   MUSCLE LENGTH: Hamstrings: increased tightness  bilaterally Piriformis:  tightness bilat  POSTURE: rounded shoulders and forward head   LUMBAR ROM:   Eval:  Decreased by approx 50%  LOWER EXTREMITY ROM:     WFL  LOWER EXTREMITY MMT:    Eval:   LE grossly WNL, but grossly 4+ to 5-/5 in right hip Core strength grossly 4-/5 throughout  LUMBAR SPECIAL TESTS:  Slump test: Negative  FUNCTIONAL TESTS:  Eval: 5 times sit to stand: 11.87 sec Single Leg Stance:  right- 1.53 sec, left- 1.40 sec  09/04/24  SLS R 4 sec; L 5 sec 09/13/24 SLS R 4 sec; L 6 sec 09/27/24  SLS R 5 sec; L 10 sec  GAIT: Distance walked: >500 ft Assistive device utilized: None Level of assistance: Complete Independence Comments: States that he has more difficulty with balance on unlevel surfaces (like grass) than firm surfaces.     MINI-BESTest: Balance Evaluation Systems Test ANTICIPATORY: SIT TO STAND: (2) normal without use of hands and stabilizes independently     (1) Moderate comes to stand WITH use of hands on 1st attempt    (0) Severe: unable to stand up from chair without assistance OR needs several attempts with use of   hands Score:  2 07/19/24, 2 09/15/24  2. RISE TO TOES: feet shoulder width apart. Hands on hips.  Rise as high as you can onto your toes. Try to hold this pose for 3 sec                          (2) stable for 3s with maximum height    (1) heels up, but not full range (smaller than when holding hands) OR noticeable instability for 3 sec                           (0) < or equat to 3 s  Score:  1 07/19/24, 2 09/15/24  3. STAND ON ONE LEG: look straight ahead. Hands on hips. Lift your leg off the ground.     Left:  trial 1 __3__ trial 2__6_                                    Right: Trial 1:___1   Trial 2:____3___   (2) 20 s       (2)  20 s                (1) < 20      (1) < 20 s   (0) Unable      (0) unable Score (use the worst side):  1 07/19/24; 1 09/15/24  REACTIVE POSTURAL CONTROL 4. COMPENSATORY STEPPING CORRECTION  FORWARD stand with feet shoulder width apart, arms at your sides. Lean forward against my hands beyond your forward limits.  When I let go, do whatever is necessary, including taking a step, to avoid a fall   (2) recovers independently  with a single, large step, to avoid a fall   (1) more than one step used to recover equilibrium   (0) No step OR would fall if not caught Score:  0  07/19/24; 2 10/17  5. COMPENSATORY STEPPING CORRECTION BACKWARD: Lean backward against my hands beyond your backward limits.  When I let go, do whatever is necessary, including a step to avoid a fall. (2) recovers independently  with a single, large step, to avoid a fall   (1) more than one step used to recover equilibrium   (0) No step OR would fall if not caught Score:  0  07/19/24, 1 09/15/24  6. COMPENSATORY STEPPING LATERAL: Lean into my hand beyond your sideways limit.  When I let go, do whatever is necessary, including taking a step, to avoid a fall. Left           Right   (2) recovers independently with 1 step (crossover or lateral OK)   (1) several steps to recover equilibrium   (0) falls or cannot step Score:     0  (use the side with the lowest score) at eval;  L  and R 2  09/15/24  SENSORY ORIENTATION 7. STANCE FEET TOGETHER EYES OPEN  firm surface Be as stable and still as possible until I say stop   (2) 30s   (1) < 30 s   (0) unable Score:  2 07/19/24 and 09/15/24  8. STANCE ON FOAM EYES CLOSED feet together, eyes closed, hands on hips. Be as stable and still as you can until I stay stop.  I will start timing when you close your eyes. Time in seconds:      (2) 30s   (1) <30 s   (0) unable Score:  1 at eval and 09/15/24  9. INCLINE EYES CLOSED: Stand on incline with feet shoulder width apart, arms down at your sides.  I will start timing when you close your eyes   (2) Stands 30 s and aligns with gravity   (1) stands < 30 s OR aligns with surface   (0) unable Score:  1 07/19/24 and  09/15/24  DYNAMIC GAIT 10. CHANGE IN GAIT SPEED: begin walking at your normal speed, when I tell you fast, walk as fast as you can, when I say slow walk very slowly   (2) significantly changes walking speed without imbalance   (1) unable to change walking speed or signs of imbalance   (0) unable to achieve significant change in walking speed AND signes of imbalance Score: 2 07/19/24 and 09/15/24  11. WALK WITH HEAD TURNS HORIZONTAL: begin walking at normal speed, when I say right, turn your head to the right, when I say left turn your head to the left.  Try to keep yourself walking in a straight line   (2) performs head turns with no change in gait speed and good balance   (1) performs head turns with reduction in gait speed   (0)  performs head turns with imbalance Score:  2 07/19/24 0 09/15/24  12. WALK WITH PIVOT TURNS: begin walking at your normal speed.  When I tell you to turn and stop, turn as quickly as you can, face the opposite direction and stop.  After the turn, your feet should be close together   (2) turns with feet close FAST in 3 steps with good balance   (1) turns with feet close SLOW > 4 steps with good balance   (0) cannot turn with feet close at any speed without imbalance Score:2 07/19/24 and 10/17  13. STEP OVER OBSTACLES:  ( 9 inch height 10 feet away from subject) begin walking at your normal speed.  When you get to the box, step over it, not around it and keep walking   (2) Able to step over box with minimal change of gait speed and good balance   (1) Steps over box but touches box OR displays cautious behavior by slowing gait   (0) Unable to step over box OR steps around box Score:  2 07/19/24 and 10/17  14. TIMED UP AND GO NORMAL AND WITH DUAL TASK: 3 METER WALK: When I say go walk at your your normal speed across the tape, turn around and come back to sit in the chair   Time: Count backwards by 3s starting at    20.  When I say go, stand up from the chair,  walk at your normal speed across the tape on the floor, turn around, come back to sit in the chair.  Continue counting backwards the entire time.  Time:   (2) No noticeable change in sitting, standing or walking while backward counting when compared to TUG without dual task   (1) Dual task affects either counting OR walking (>10%)    (0) stops counting while walking OR stops walking while counting If gait speed slows more than 10% between TUG without and with dual task, the score should be decreased by a point Score: 1 07/19/24 2 10/17   Total score:       17    /28  on 07/19/24    20/28 on 10 /17/ 25      TREATMENT DATE:  10/04/24(SBA/CGA at all times) Vibration plate 6k69 sec with squats for calf stretch; 60 sec massage Hands on hips - come up on toes and hold Incline hip width EO 30 sec x 3 reps TUG and TUG counting backward 6.75 sec/8.39 sec Farmer carry 15# with marching - prolonged stance time x 30 ft B II bars: front to back touches II bars: SLS  L max 10 sec, R max 5 sec Sit to stands 5# x 10, squat to chair with 5# x 10 Obstacle course 2 rounds Cone taps and cone knock downs/ups B multiple reps   10/02/24(SBA/CGA at all times) Vibration plate 6k69 sec with squats for calf stretch Farmer carry 15# with marching - prolonged stance time x 30 ft B At wall: hip hinge to work on post hip strategy, back to wall OH reach pushing hips forward to work on ant hip strategy, Forward lunge in II bars with push off to stand max reps  Step back lunges in II bars with push off to fatigue Lateral lunges in II bars with push off  Foam beam in II bars: side stepping - very challenging II bars: braiding front, back and alternating II bars: front to back touches II bars: SLS  L max 10 sec, R max 5 sec Sit to stands 5# x 10, squat to chair with 5# x 10 Agility ladder - multi directional Walking on cobblestone pads    09/27/24(SBA/CGA at all times) Vibration plate 6k69 sec with squats for  calf stretch Farmer carry 15# with marching - prolonged stance time x 30 ft B At wall: hip hinge to work on post hip strategy, back to wall OH reach pushing hips forward to work on ant hip strategy, lateral SB to wall pushing weight through outside foot Forward lunge in II bars with push off to stand max reps  Step back lunges in II bars with push off to fatigue Lateral lunges in II bars with push off  Foam beam in II bars: side stepping - very challenging II bars: braiding front, back and alternating II bars: front to back touches II bars: SLS  L max 10 sec, R max 5 sec Sit to stands 5# x 5, squat to chair with 5# x 10   09/25/24(SBA/CGA at all times) Vibration plate 6k69 sec with squats Aura carry unilaterally 15# KB hallway Farmer carry 15# with marching - prolonged stance  Step back lunges x 10 Lateral lunges x 10  Toe taps fwd and back crossing midline - also die front taps to lateral x 10 ea; back taps to lateral x 10 Standing dynamic HS stretch B at barre Melt balls to bottom of feet to increase mobility  Manual: R ankle PROM with distraction; STM to extensor digitorum brevis; calcaneal mobs    09/20/24(SBA/CGA at all times) Aura carry unilaterally 15# KB around cancer gym Farmer carry 8# overhead x 2 hallway B 30# carry x 1 hall way B Step back lunges x 10 Lateral lunges x 10  Heel/toe walking  4 laps at barre ea Reach behinds to counter in different stance positions Hurdles with 3 sec knee raises fwd multiple reps  Sit to stands x 5, 3 with narrow base, 8# x 8 Toe taps fwd and back crossing midline - max 2  Step ups 20# KB onto aerobic step + 1 riser x 10 B    PATIENT EDUCATION:  Education details: Issued HEP Person educated: Patient Education method: Explanation, Demonstration, and Handouts Education comprehension: verbalized understanding and returned demonstration  HOME EXERCISE PROGRAM: Access Code: [facility] URL:  [email or web address] Date: 07/25/2024 Prepared by: Mliss  Exercises - Seated Transversus Abdominis Bracing  - 1 x daily - 7 x weekly - 2 sets - 10 reps - Seated Hamstring Stretch  - 1 x daily - 7 x weekly - 2 reps - 20 sec hold - Seated Piriformis Stretch  - 1 x daily - 7 x weekly - 2 reps - 20 sec hold - Single Leg Stance with Support  - 1 x daily - 7 x weekly - 2 reps - 20 sec hold - Shoulder External Rotation and Scapular Retraction with Resistance  - 1 x daily - 7 x weekly - 2 sets - 10 reps - Standing Shoulder Horizontal Abduction with Resistance  - 1 x daily - 7 x weekly - 2 sets - 10 reps - Standing Shoulder Row with Anchored Resistance  - 1 x daily - 7 x weekly - 2 sets - 10 reps - Shoulder extension with resistance - Neutral  - 1 x daily - 7 x weekly - 2 sets - 10 reps - Single Leg Balance with Clock Reach  - 1 x daily - 7 x weekly - 10 reps - Standing 'L' Stretch at Counter  - 1 x daily - 7 x weekly - 2 reps - 20 sec hold - Resisted Sit-to-Stand With Dumbbell at Chest  - 1 x daily - 3 x weekly - 2 sets - 10 reps - Side Step Overs with Cones and Unilateral Counter Support  - 1 x daily - 3 x weekly - 2 sets - 10 reps - Standing 3-Way Leg Reach with Resistance at Ankles and Counter Support  - 1 x daily - 3 x weekly - 2 sets - 10 reps - Side Stepping with Resistance at Ankles  - 1 x daily - 3 x weekly - 2 sets - 10 reps - Forward Monster Walk with Resistance at Ankles  and Counter Support  - 1 x daily - 3 x weekly - 2 sets - 10 reps - Squat with Chair Touch  - 1 x daily - 3 x weekly - 2 sets - 10 reps - Kneeling Plantarflexion Stretch  - 1 x daily - 3 x weekly - 1 sets - 3 reps - 30 sec hold - Supine Ankle Dorsiflexion and Plantarflexion AROM  - 1 x daily - 7 x weekly - 2 sets - 10 reps - Supine Ankle Inversion and Eversion AROM  - 1 x daily - 7 x weekly - 2 sets - 10 reps - Supine Ankle Circles  - 1 x daily - 7 x weekly - 2 sets - 10 reps - Seated Ankle Alphabet  - 1 x  daily - 7 x weekly - 1 sets - 1 reps  ASSESSMENT:  CLINICAL IMPRESSION:  Patient demonstrates increased balance overall with activities. We challenged him more cognitively today with front and back taps while doing biceps curls and this improved his balance somewhat.  His SLS has improved beyond 5 sec bilaterally and today did not require multiple reps to achieve this. Much improved balance with agility ladder and standing on inclines. He demonstrates improved COG. Patient continues to demonstrate improvement in ankle and hip strategies and with stepping strategies. Patient feels he has reached his max potential with rehab in regards to balance and requests discharge at this time.    OBJECTIVE IMPAIRMENTS: decreased balance, decreased strength, increased muscle spasms, impaired flexibility, postural dysfunction, and pain.   ACTIVITY LIMITATIONS: lifting, standing, and stairs  PARTICIPATION LIMITATIONS: driving, community activity, and yard work  PERSONAL FACTORS: Time since onset of injury/illness/exacerbation and 1-2 comorbidities: OA, right hip arthroplasty are also affecting patient's functional outcome.   REHAB POTENTIAL: Good  CLINICAL DECISION MAKING: Stable/uncomplicated  EVALUATION COMPLEXITY: Low   GOALS: Goals reviewed with patient? Yes  SHORT TERM GOALS: Target date: 06/16/2024  Patient will be independent with initial HEP. Baseline: Goal status: Met on 05/31/24  2.  Patient will demonstrate and verbalize knowledge of proper lifting techniques to allow him to lift suitcases for travel. Baseline:  Goal status: MET   LONG TERM GOALS: Target date: 10/11/2024    Patient will be independent with advanced HEP to allow for self progression after discharge. Baseline:  Goal status: MET  2.  Patient will improve modified Oswestry to not greater than 5% to demonstrate improved functional mobility. Baseline: 18% Goal status: NOT MET 07/05/24  8%   3.  Patient will increase  right hip strength to [facility] to allow him to navigate stairs with reciprocal pattern without difficulty/discomfort. Baseline:  Goal status: MET 07/05/24  4.  Patient will increase bilateral single leg stance to greater than 5 seconds to decrease patients fall risk. Baseline: right- 1.53 sec, left- 1.40 sec Goal status: MET 09/15/24 MODIFIED TO 5 SECONDS  5.  Patient will report ability to ambulate over various unlevel surfaces for desired length (at least greater than 30 minutes) without a loss of balance to allow patient to travel and sight-see. Baseline:  Goal status: PARTIALLY MET (Met in clinic for various surfaces, but not tested for 30 min)  6 Patient will improve dynamic balance by using an ankle strategy in order to reduce loss of balance in the posterior direction Baseline:  Goal status: MET  7. Improved mini- best score to 23/28 indicating improved dynamic balance and decreased risk of falls  Baseline: 17/28 Goal status: PARTIALLY MET 10/17  20/28  PLAN:  PT FREQUENCY: 2x/week  PT DURATION: 12 weeks  (pt will be traveling for a few weeks in September)  PLANNED INTERVENTIONS: 02835- PT Re-evaluation, 97750- Physical Performance Testing, 97110-Therapeutic exercises, 97530- Therapeutic activity, [id number]- Neuromuscular re-education, [phone]- Self Care, 02859- Manual therapy, [phone]- Gait training, [phone]- Canalith repositioning, [id number]- Aquatic Therapy, [phone]- Electrical stimulation (unattended), [phone]- Electrical stimulation (manual), [id number]- Ultrasound, [id number]- Traction (mechanical), [id number]- Ionotophoresis 4mg /ml Dexamethasone , 79439 (1-2 muscles), 20561 (3+ muscles)- Dry Needling, Patient/Family education, Balance training, Stair training, Taping, Joint mobilization, Joint manipulation, Spinal manipulation, Spinal mobilization, Cryotherapy, and Moist heat.  PLAN FOR NEXT SESSION: see D/C summary  Mliss Cummins, PT  10/04/24 5:14 PM [facility] [address],  [address] [place], [place] [address] Phone # [phone] Fax [phone]  PHYSICAL THERAPY DISCHARGE SUMMARY  Visits from Start of Care: 24  Current functional level related to goals / functional outcomes: See above clinical impression.    Remaining deficits: See above clinical impression.    Education / Equipment: HEP   Patient agrees to discharge. Patient goals were partially met. Patient is being discharged due to maximized rehab potential.   Mliss Cummins, PT 10/04/24 5:14 PM

## 2024-10-05 ENCOUNTER — Encounter: Payer: Self-pay | Admitting: Family Medicine

## 2024-10-05 ENCOUNTER — Ambulatory Visit: Payer: Self-pay | Admitting: Family Medicine

## 2024-10-05 ENCOUNTER — Ambulatory Visit (INDEPENDENT_AMBULATORY_CARE_PROVIDER_SITE_OTHER): Admitting: Family Medicine

## 2024-10-05 VITALS — BP 112/62 | HR 62 | Temp 98.2°F | Ht 68.0 in | Wt 175.2 lb

## 2024-10-05 DIAGNOSIS — R233 Spontaneous ecchymoses: Secondary | ICD-10-CM | POA: Diagnosis not present

## 2024-10-05 DIAGNOSIS — I5020 Unspecified systolic (congestive) heart failure: Secondary | ICD-10-CM | POA: Diagnosis not present

## 2024-10-05 DIAGNOSIS — Z95811 Presence of heart assist device: Secondary | ICD-10-CM | POA: Diagnosis not present

## 2024-10-05 DIAGNOSIS — I4892 Unspecified atrial flutter: Secondary | ICD-10-CM | POA: Diagnosis not present

## 2024-10-05 DIAGNOSIS — H1131 Conjunctival hemorrhage, right eye: Secondary | ICD-10-CM

## 2024-10-05 DIAGNOSIS — I110 Hypertensive heart disease with heart failure: Secondary | ICD-10-CM

## 2024-10-05 DIAGNOSIS — Z0000 Encounter for general adult medical examination without abnormal findings: Secondary | ICD-10-CM | POA: Diagnosis not present

## 2024-10-05 DIAGNOSIS — R3915 Urgency of urination: Secondary | ICD-10-CM | POA: Diagnosis not present

## 2024-10-05 DIAGNOSIS — E785 Hyperlipidemia, unspecified: Secondary | ICD-10-CM

## 2024-10-05 DIAGNOSIS — Z131 Encounter for screening for diabetes mellitus: Secondary | ICD-10-CM

## 2024-10-05 DIAGNOSIS — I700 Atherosclerosis of aorta: Secondary | ICD-10-CM

## 2024-10-05 DIAGNOSIS — R739 Hyperglycemia, unspecified: Secondary | ICD-10-CM

## 2024-10-05 DIAGNOSIS — I4891 Unspecified atrial fibrillation: Secondary | ICD-10-CM | POA: Diagnosis not present

## 2024-10-05 LAB — URINALYSIS, ROUTINE W REFLEX MICROSCOPIC
Bilirubin Urine: NEGATIVE
Hgb urine dipstick: NEGATIVE
Ketones, ur: NEGATIVE
Leukocytes,Ua: NEGATIVE
Nitrite: NEGATIVE
Specific Gravity, Urine: <=1.005 — AB (ref 1.000–1.030)
Total Protein, Urine: NEGATIVE
Urine Glucose: NEGATIVE
Urobilinogen, UA: 0.2 (ref 0.0–1.0)
WBC, UA: NONE SEEN — AB (ref 0–?)
pH: 6 (ref 5.0–8.0)

## 2024-10-05 NOTE — Progress Notes (Signed)
 Phone: [phone]   Subjective:  Patient presents today for their annual physical. Chief complaint-noted.   See problem oriented charting- ROS- full  review of systems was completed and negative  except for topics noted under acute/chronic concerns  The following were reviewed and entered/updated in epic: Past Medical History:  Diagnosis Date   Abscess    right posterior neck   Anticoagulation adequate 06/07/2019   Overview:  SSS, paroxAFib/Flutter, CHADSvasc= 3 age, h/o cva; chronic OAC Xarelto    Atrial fibrillation and flutter (HCC) 01/01/2015   Noted 2016. Xarelto     BPH associated with nocturia 08/14/2014   Finasteride , flomax .  0-3x nocturia. PSA to 14-->eval urology [facility]. PSA trended back down to about 2.6 per records in 2015 then placed on finasteride - had significant voiding issues when spiked to 14- improved on meds    Bradycardia    CAD (coronary artery disease) 05/26/2016   Coronary CTA_ 50-75% D1 off CT report    Cardiac pacemaker in situ 12/28/2009   SA node dysfunction as cause    Chest pressure 12/01/2015   Overview:  atypical CP, [facility] nuclear perfusion imaging w/ no inarct, no ischemia, EF-40% NICM   Chronic systolic heart failure (HCC) 12/01/2015   Overview:  [facility] nuclear perfusion imaging study, no inarct, no ischemia, EF-40% NICM   CVA (cerebral vascular accident) (HCC)    Double vision 07/04/2018   Elevated LFTs    Erectile dysfunction 01/30/2015   cialis  prn    External hemorrhoids    Fitting or adjustment of cardiac pacemaker 06/07/2019   Overview:  Biotronik Vassar DR [id number], RA and RV leads [id number]:  [id number] and [id number]   Former smoker 01/30/2015   Former smoker. 20 pack years quit 92. Patient declined AAA screen.     GERD (gastroesophageal reflux disease)    Gout 11/11/2009   Qualifier: Diagnosis of  By: Gardenia Pao     History of nuclear stress test 06/07/2019   Overview:  [facility], no inarct, no ischemia, EF-40% NICM   HTN (hypertension)    Hyperglycemia  06/10/2017   110s CBG 2018   Hyperlipidemia 07/12/2007   Crestor  20mg  daily with some myalgias; fish oil. Trig 200-500 despite this. LDL ok.        Insomnia 01/30/2015   ambien  5mg  prn. May refill.     Ischemic optic neuropathy    Left chest pressure 03/19/2015   Other and unspecified hyperlipidemia    Sinoatrial node dysfunction (HCC)    Spinal stenosis    Trigger finger, right middle finger 03/01/2018   Patient Active Problem List   Diagnosis Date Noted   Paroxysmal atrial fibrillation (HCC) 07/27/2019    Priority: High   S/P cardiac cath 06/07/2019    Priority: High   CAD (coronary artery disease) 05/26/2016    Priority: High   Nonischemic cardiomyopathy (HCC) 04/23/2016    Priority: High   Chronic systolic heart failure (HCC) 12/01/2015    Priority: High   Cerebral infarction (HCC) 01/30/2015    Priority: High   Atrial fibrillation and flutter (HCC) 01/01/2015    Priority: High   Ventricular tachycardia, non-sustained (HCC) 01/25/2013    Priority: High   Cardiac pacemaker in situ 12/28/2009    Priority: High   Aortic atherosclerosis 08/01/2020    Priority: Medium    Fatty liver 08/01/2020    Priority: Medium    Double vision 07/04/2018    Priority: Medium    Hyperglycemia 06/10/2017    Priority: Medium    Insomnia 01/30/2015  Priority: Medium    BPH associated with nocturia 08/14/2014    Priority: Medium    Gout 11/11/2009    Priority: Medium    Hyperlipidemia 07/12/2007    Priority: Medium    NEUROPATHY, ISCHEMIC OPTIC 06/02/2007    Priority: Medium    Essential hypertension 06/02/2007    Priority: Medium    Anticoagulation adequate 06/07/2019    Priority: Low   Convergence insufficiency 06/07/2019    Priority: Low   Fitting or adjustment of cardiac pacemaker 06/07/2019    Priority: Low   Pacemaker-dependent due to native cardiac rhythm insufficient to support life 06/07/2019    Priority: Low   Palpitations 06/07/2019    Priority: Low   History of  nuclear stress test 06/07/2019    Priority: Low   Non-occlusive coronary artery disease 06/07/2019    Priority: Low   Trigger finger, right middle finger 03/01/2018    Priority: Low   Solitary pulmonary nodule 05/26/2016    Priority: Low   Chest pressure 12/01/2015    Priority: Low   Left chest pressure 03/19/2015    Priority: Low   Erectile dysfunction 01/30/2015    Priority: Low   Former smoker 01/30/2015    Priority: Low   Allergic rhinitis 10/11/2014    Priority: Low   Sinoatrial node dysfunction (HCC)     Priority: Low   SPINAL STENOSIS, LUMBAR 08/22/2010    Priority: Low   GERD 03/17/2010    Priority: Low   Presence of heart assist device (HCC) 10/05/2024   Past Surgical History:  Procedure Laterality Date   ABSCESS DRAINAGE     righ tposterior neck   CARDIAC CATHETERIZATION  1996   CHOLECYSTECTOMY  02/07/2003   COLONOSCOPY  06/23/2004   EYE SURGERY     left eye   left ingunial hernia  06/08/2011   PACEMAKER INSERTION     PPM GENERATOR CHANGEOUT N/A 07/12/2024   Procedure: PPM GENERATOR CHANGEOUT;  Surgeon: Waddell Danelle ORN, MD;  Location: [facility];  Service: Cardiovascular;  Laterality: N/A;   right hip replacement     2012 Dr. Hiram    Family History  Problem Relation Age of Onset   Lung cancer Mother        mets to brain   Heart attack Mother 32   Hypertension Mother    Brain cancer Mother    Heart attack Father        mid 84s, smoker.    Diabetes Father    Cancer Brother    Diabetes Paternal Grandfather    Colon cancer Neg Hx    Esophageal cancer Neg Hx    Stomach cancer Neg Hx    Rectal cancer Neg Hx     Medications- reviewed and updated Current Outpatient Medications  Medication Sig Dispense Refill   benazepril  (LOTENSIN ) 5 MG tablet Take 1 tablet by mouth once daily 90 tablet 0   cyclobenzaprine  (FLEXERIL ) 5 MG tablet Take 1 tablet (5 mg total) by mouth daily as needed. for muscle spams 90 tablet 0   finasteride  (PROSCAR ) 5 MG  tablet Take 5 mg by mouth daily.     fluticasone (FLONASE) 50 MCG/ACT nasal spray Place 2 sprays into both nostrils daily.     gabapentin  (NEURONTIN ) 400 MG capsule Take 1 capsule by mouth twice daily 180 capsule 0   gemfibrozil  (LOPID ) 600 MG tablet TAKE 1 TABLET BY MOUTH TWICE DAILY BEFORE  A  MEAL 180 tablet 3   icosapent Ethyl (VASCEPA)  1 g capsule Take 1 g by mouth 2 (two) times daily.     indomethacin  (INDOCIN ) 25 MG capsule Take 1 capsule (25 mg total) by mouth 2 (two) times daily with a meal. 60 capsule 3   Multiple Vitamin (MULTIVITAMIN) tablet Take 1 tablet by mouth daily.     omeprazole  (PRILOSEC) 20 MG capsule Take 1 capsule by mouth once daily 90 capsule 0   rosuvastatin  (CRESTOR ) 40 MG tablet Take 40 mg by mouth daily. (Patient taking differently: Take 20 mg by mouth daily.)     tadalafil  (CIALIS ) 5 MG tablet TAKE 1 TABLET BY MOUTH DAILY AS NEEDED FOR ERECTILE DYSFUNCTION 90 tablet 3   tamsulosin  (FLOMAX ) 0.4 MG CAPS capsule Take 1 capsule by mouth once daily 90 capsule 0   XARELTO  20 MG TABS tablet TAKE 1 TABLET BY MOUTH ONCE DAILY WITH SUPPER 90 tablet 2   zolpidem  (AMBIEN ) 5 MG tablet TAKE 1 TABLET BY MOUTH AT BEDTIME 90 tablet 0   metoprolol  succinate (TOPROL -XL) 25 MG 24 hr tablet Take 1 tablet (25 mg total) by mouth daily. 90 tablet 3   metoprolol  tartrate (LOPRESSOR ) 25 MG tablet Take 1 tablet (25 mg total) by mouth as needed. Take 1 tablet (25 mg total) as needed for heart rate greater than 120 45 tablet 3   No current facility-administered medications for this visit.    Allergies-reviewed and updated No Known Allergies  Social History   Social History Narrative   Married. 1 child and 1 step child. 2 grandchildren.       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 Oropharynx normal Neck: no thyromegaly CV: RRR no murmurs rubs or gallops Lungs: CTAB no crackles, wheeze, rhonchi Abdomen: soft/nontender/nondistended/normal bowel sounds. No rebound or guarding.  Ext: no edema Skin: warm, dry Neuro: grossly normal, moves all extremities, PERRLA- but sclera deeply red extending about 50% of the distance around the iris   Assessment and Plan  78 y.o. male presenting for annual physical.  Health Maintenance counseling: 1. Anticipatory guidance: Patient counseled regarding regular dental exams -q6 months- or yearly, eye exams -yearly or every other year- see issue below,  avoiding smoking and second hand smoke , limiting alcohol to 2 beverages per day - social 2-3 most per week, no illicit drugs .   2. Risk factor reduction:  Advised patient of need for regular exercise and diet rich and fruits and vegetables to reduce risk of heart attack and stroke.  Exercise-issues with spinal stenosis can be a barrier to walking.  Still swimming.  Diet/weight management-weight has gradually trended up in the last year- encouraged reversal.  Wt Readings from Last 3 Encounters:  10/05/24 175 lb 3.2 oz (79.5 kg)  07/12/24 165 lb (74.8 kg)  07/07/24 168 lb 4.8 oz (76.3 kg)  3. Immunizations/screenings/ancillary studies-still considering Shingrix  Immunization History  Administered Date(s) Administered   Fluad Quad(high Dose 65+) 08/01/2021, 08/17/2022   Hepatitis A, Adult 08/08/2015, 05/26/2016   INFLUENZA, HIGH DOSE SEASONAL PF 08/08/2015,  09/02/2017, 08/31/2019, 09/18/2020, 08/01/2023, 08/01/2024   Influenza Split 11/09/2011, 09/20/2012, 09/12/2018   Influenza Whole 11/14/2007   Influenza,inj,Quad PF,6+ Mos 09/14/2013, 12/27/2014, 09/12/2018   Influenza-Unspecified 08/22/2019   Moderna Covid-19 Fall  Seasonal Vaccine 22yrs & older 08/17/2022   Moderna Sars-Covid-2 Vaccination 12/26/2019, 02/01/2020, 09/24/2020, 03/03/2021   PNEUMOCOCCAL CONJUGATE-20 07/05/2023   Pfizer(Comirnaty)Fall Seasonal Vaccine 12 years and older 08/01/2023   Pneumococcal Conjugate-13 01/30/2015   Pneumococcal Polysaccharide-23 11/09/2011   Td 10/04/2008   Tdap 07/05/2023   Unspecified SARS-COV-2 Vaccination 08/02/2024  4. Prostate cancer screening- see BPH below . Seen by urology in April - they checked psa Lab Results  Component Value Date   PSA 1.19 03/09/2022   PSA 1.71 10/07/2021   PSA 2.39 09/16/2021   5. Colon cancer screening - Dr. Shila has released him from further colonoscopies 6. Skin cancer screening- sees dermatology regularly. advised regular sunscreen use. Denies worrisome, changing, or new skin lesions.  7. Smoking associated screening (lung cancer screening, AAA screen 65-75, UA)- former smoker- no abdominal aortic aneurysm on CT 03/2019 8. STD screening - only active with wife  Status of chronic or acute concerns   #Concern for UTI S: Patients symptoms started about 6 months ago. Reports discomfort in bladder when needs to urinate- does not burn when he pees thankfully. Goes away once he urinates.  Complains of dysuria: NO; polyuria: no increase; urgency: yes.  Symptoms are pretty stable.  ROS- no fever, chills, nausea, vomiting, flank pain. No blood in urine.   A/P: UA will be obtained as well as culture. Not clear if urinary tract infection but will check- BPH could contribute  #eye issues-  noted redness for 2 weeks in right eye over sclera on medial portion. Vision is normal.  Friends feel it is worsening possibly- wife felt like may be improving but then looked again and seemed stable- he's unable to see it. Cancelled eye doctor visit as knew he was coming today. No trauma to eye. Grainy feeling at times -subconjunctival hemorrhage noted and covering about 50% of the sclera- with  upcoming travel we opted to hold xarelto  for 5 days to see if we can slow this down- typically without intervention and without anticoagulant this would be in process of resolving at 2 weeks -winter trip to [place] for a month leaving in a week  #musculoskeletal issues workign with DrRONITA Leonce- on gabapentin  for his spinal stenosis and working with physical therapy. Has had injections x5. Opted to hold off on neurosurgery visit. Gabapentin  seems to be helpful. Spinal stenosis has caused disabling pain in past- indomethacin  really helps him if he takes- used to do every 3-4 days but holding off on that thankfully with the xarelto   #CV issues 1.  Coronary artery disease-compliant with Xarelto  20 mg-not on aspirin as a result of Xarelto - see discussion above about xarelto  2.  Hyperlipidemia-compliant with rosuvastatin  40 mg --> 20 mg reduced by cardiology with LDL goal 70 or less.  Also takes gemfibrozil  and vascepa 1g twice daily - prior [facility] 3.  Nonischemic cardiomyopathy/systolic heart failure -prior [facility].  Compliant with benazepril  5 mg metoprolol  25 mg extended release.  Has not required Lasix  4. sinus node dysfunction with pacemaker in place 5.  Atrial fibrillation and flutter-on chronic Xarelto  and metoprolol  25 extended release with additional 25 mg instant release if needed 6.  Aortic atherosclerosis-on statin and Xarelto  Lab Results  Component Value Date   CHOL 131 03/08/2023   HDL 41.10 03/08/2023   LDLCALC  53 03/08/2023   LDLDIRECT 70.0 11/16/2019   TRIG 187.0 (H) 03/08/2023   CHOLHDL 3 03/08/2023  A/P: cardiovascular issues overall stable- update lipids. Upcoming visit with cardiology- we need to check lipids  #Vo2 max concern- he's at 14 with 10 pounds weight gain. He has just gotten back in gym more consistently in last month   #Insomnia- not needing Ambien  on gabapentin  thankfully   #BPH S: medication(s) :  Flomax  0.4 mg - prior:  finasteride  5 mg but affected libido-  no worsening off of it A/P: reasonably stable- his urinary fulllness and need to go to bathroom likely related to this    #Arthritis-minimizes his use due to xarelto   S: Patient on indomethacin  25 mg twice daily for 3 days maximum per week with meal A/P: very rare lately as above    # Hyperglycemia/insulin resistance/prediabetes-peak A1c 5.9 S:  Medication: none Lab Results  Component Value Date   HGBA1C 6.4 10/08/2023   HGBA1C 6.0 11/17/2022   HGBA1C 6.1 03/09/2022  A/P: hopefully stable- update a1c today. Continue without meds for now . I'm worried on optho  #hypertension S: medication: Metoprolol  25 mg extended release, benazepril  5 mg A/P: stable- continue current medicines    #Gout-not on uric acid lowering agent  Recommended follow up: Return in about 6 months (around 04/04/2025) for followup or sooner if needed.Schedule b4 you leave. Future Appointments  Date Time Provider Department Center  10/11/2024  2:30 PM Waddell Danelle ORN, MD [facility]  11/24/2024  7:00 AM [facility] DEVICE REMOTES [facility]  02/23/2025  7:00 AM [facility] DEVICE REMOTES [facility]  02/26/2025  2:20 PM [facility] ANNUAL WELLNESS VISIT 1 [facility] [place]  05/25/2025  7:00 AM [facility] DEVICE REMOTES [facility]  08/24/2025  7:00 AM [facility] DEVICE REMOTES [facility]    Lab/Order associations:NOT fasting- but can come back fasting   ICD-10-CM   1. Preventative health care  Z00.00     2. Presence of heart assist device (HCC)  Z95.811     3. Subconjunctival hemorrhage of right eye  H11.31 Ambulatory referral to Ophthalmology    4. Hyperlipidemia, unspecified hyperlipidemia type  E78.5 Comprehensive metabolic panel with GFR    CBC with Differential/Platelet    Lipid panel    Lipoprotein A (LPA)    Apolipoprotein B    5. Hyperglycemia  R73.9 Hemoglobin A1c    6. Screening for diabetes mellitus  Z13.1 Hemoglobin A1c    7. Urinary urgency  R39.15 Urinalysis, Routine w reflex microscopic     Urine Culture      No orders of the defined types were placed in this encounter.   Return precautions advised.  Garnette Lukes, MD

## 2024-10-05 NOTE — Patient Instructions (Addendum)
 You have been scheduled for an Urgent Eye Visit with [facility] located at [address], [place], [place] [address]. Please arrive at 8:15am on Friday 10/05/2024. You will be seeing Dr. Delories.   -subconjunctival hemorrhage noted and covering about 50% of the sclera- with upcoming travel we opted to hold xarelto  for 5 days to see if we can slow this down- typically without intervention and without anticoagulant this would be in process of resolving at 2 weeks  Come back for fasting labs- we were going to do tomorrow morning but I want you to see the eye doctor as highest priority- you can schedule after that  Recommended follow up: Return in about 6 months (around 04/04/2025) for followup or sooner if needed.Schedule b4 you leave.

## 2024-10-06 ENCOUNTER — Other Ambulatory Visit: Payer: Self-pay | Admitting: Family Medicine

## 2024-10-06 ENCOUNTER — Other Ambulatory Visit

## 2024-10-06 DIAGNOSIS — H1131 Conjunctival hemorrhage, right eye: Secondary | ICD-10-CM | POA: Diagnosis not present

## 2024-10-06 DIAGNOSIS — R233 Spontaneous ecchymoses: Secondary | ICD-10-CM

## 2024-10-06 LAB — CBC WITH DIFFERENTIAL/PLATELET
Basophils Absolute: 0 K/uL (ref 0.0–0.1)
Basophils Relative: 0.3 % (ref 0.0–3.0)
Eosinophils Absolute: 0.3 K/uL (ref 0.0–0.7)
Eosinophils Relative: 3.7 % (ref 0.0–5.0)
HCT: 42 % (ref 39.0–52.0)
Hemoglobin: 14 g/dL (ref 13.0–17.0)
Lymphocytes Relative: 25.5 % (ref 12.0–46.0)
Lymphs Abs: 2 K/uL (ref 0.7–4.0)
MCHC: 33.4 g/dL (ref 30.0–36.0)
MCV: 91.2 fl (ref 78.0–100.0)
Monocytes Absolute: 0.6 K/uL (ref 0.1–1.0)
Monocytes Relative: 7.4 % (ref 3.0–12.0)
Neutro Abs: 5 K/uL (ref 1.4–7.7)
Neutrophils Relative %: 63.1 % (ref 43.0–77.0)
Platelets: 274 K/uL (ref 150.0–400.0)
RBC: 4.61 Mil/uL (ref 4.22–5.81)
RDW: 14.3 % (ref 11.5–15.5)
WBC: 8 K/uL (ref 4.0–10.5)

## 2024-10-06 LAB — LIPID PANEL
Cholesterol: 144 mg/dL (ref 0–200)
HDL: 43.5 mg/dL (ref >39.00)
LDL Cholesterol: 65 mg/dL (ref 0–99)
NonHDL: 100.25
Total CHOL/HDL Ratio: 3
Triglycerides: 175 mg/dL — ABNORMAL HIGH (ref 0.0–149.0)
VLDL: 35 mg/dL (ref 0.0–40.0)

## 2024-10-06 LAB — COMPREHENSIVE METABOLIC PANEL WITH GFR
ALT: 20 U/L (ref 0–53)
AST: 21 U/L (ref 0–37)
Albumin: 4.6 g/dL (ref 3.5–5.2)
Alkaline Phosphatase: 83 U/L (ref 39–117)
BUN: 14 mg/dL (ref 6–23)
CO2: 27 meq/L (ref 19–32)
Calcium: 9.5 mg/dL (ref 8.4–10.5)
Chloride: 105 meq/L (ref 96–112)
Creatinine, Ser: 1.15 mg/dL (ref 0.40–1.50)
GFR: 61.19 mL/min (ref >60.00)
Glucose, Bld: 124 mg/dL — ABNORMAL HIGH (ref 70–99)
Potassium: 4.3 meq/L (ref 3.5–5.1)
Sodium: 142 meq/L (ref 135–145)
Total Bilirubin: 0.8 mg/dL (ref 0.2–1.2)
Total Protein: 7 g/dL (ref 6.0–8.3)

## 2024-10-06 LAB — URINE CULTURE
MICRO NUMBER:: 17200766
Result:: NO GROWTH
SPECIMEN QUALITY:: ADEQUATE

## 2024-10-06 LAB — PROTIME-INR
INR: 1.2 ratio — ABNORMAL HIGH (ref 0.8–1.0)
Prothrombin Time: 12.4 s (ref 9.6–13.1)

## 2024-10-06 LAB — HEMOGLOBIN A1C: Hgb A1c MFr Bld: 6.2 % (ref 4.6–6.5)

## 2024-10-07 LAB — APOLIPOPROTEIN B: Apolipoprotein B: 94 mg/dL — ABNORMAL HIGH (ref <90)

## 2024-10-09 ENCOUNTER — Encounter: Admitting: Physical Therapy

## 2024-10-11 ENCOUNTER — Encounter: Payer: Self-pay | Admitting: Internal Medicine

## 2024-10-11 ENCOUNTER — Ambulatory Visit: Attending: Student in an Organized Health Care Education/Training Program | Admitting: Internal Medicine

## 2024-10-11 ENCOUNTER — Encounter: Admitting: Physical Therapy

## 2024-10-11 VITALS — BP 114/66 | HR 72 | Ht 68.0 in | Wt 171.0 lb

## 2024-10-11 DIAGNOSIS — I495 Sick sinus syndrome: Secondary | ICD-10-CM

## 2024-10-11 NOTE — Patient Instructions (Signed)

## 2024-10-11 NOTE — Progress Notes (Signed)
 HPI [name] returns today for followup. He is a pleasant 78 yo man with sinus node dysfunction, s/p PPM insertion. He has non-obstructive CAD. He has dyslipidemia and is on statin therapy. In the interim, he notes he feels well and has gone back to swimming. He denies palpitations. He notes that his HR gets into the 115 range with exertion, down about 10-15/min.  He has occaisional palpitations.  He is s/p PPM gen change out and now has a Medtronic device making his system MRI compatible.   No Known Allergies   Current Outpatient Medications  Medication Sig Dispense Refill   benazepril  (LOTENSIN ) 5 MG tablet Take 1 tablet by mouth once daily 90 tablet 0   cyclobenzaprine  (FLEXERIL ) 5 MG tablet Take 1 tablet (5 mg total) by mouth daily as needed. for muscle spams 90 tablet 0   finasteride  (PROSCAR ) 5 MG tablet Take 5 mg by mouth daily.     fluticasone (FLONASE) 50 MCG/ACT nasal spray Place 2 sprays into both nostrils daily.     gabapentin  (NEURONTIN ) 400 MG capsule Take 1 capsule by mouth twice daily 180 capsule 0   gemfibrozil  (LOPID ) 600 MG tablet TAKE 1 TABLET BY MOUTH TWICE DAILY BEFORE  A  MEAL 180 tablet 3   icosapent Ethyl (VASCEPA) 1 g capsule Take 1 g by mouth 2 (two) times daily.     indomethacin  (INDOCIN ) 25 MG capsule Take 1 capsule (25 mg total) by mouth 2 (two) times daily with a meal. 60 capsule 3   metoprolol  succinate (TOPROL -XL) 25 MG 24 hr tablet Take 1 tablet (25 mg total) by mouth daily. 90 tablet 3   metoprolol  tartrate (LOPRESSOR ) 25 MG tablet Take 1 tablet (25 mg total) by mouth as needed. Take 1 tablet (25 mg total) as needed for heart rate greater than 120 45 tablet 3   Multiple Vitamin (MULTIVITAMIN) tablet Take 1 tablet by mouth daily.     omeprazole  (PRILOSEC) 20 MG capsule Take 1 capsule by mouth once daily 90 capsule 0   rosuvastatin  (CRESTOR ) 40 MG tablet Take 40 mg by mouth daily. (Patient taking differently: Take 20 mg by mouth daily.)      tadalafil  (CIALIS ) 5 MG tablet TAKE 1 TABLET BY MOUTH DAILY AS NEEDED FOR ERECTILE DYSFUNCTION 90 tablet 3   tamsulosin  (FLOMAX ) 0.4 MG CAPS capsule Take 1 capsule by mouth once daily 90 capsule 0   XARELTO  20 MG TABS tablet TAKE 1 TABLET BY MOUTH ONCE DAILY WITH SUPPER 90 tablet 2   zolpidem  (AMBIEN ) 5 MG tablet TAKE 1 TABLET BY MOUTH AT BEDTIME 90 tablet 0   No current facility-administered medications for this visit.     Past Medical History:  Diagnosis Date   Abscess    right posterior neck   Anticoagulation adequate 06/07/2019   Overview:  SSS, paroxAFib/Flutter, CHADSvasc= 3 age, h/o cva; chronic OAC Xarelto    Atrial fibrillation and flutter (HCC) 01/01/2015   Noted 2016. Xarelto     BPH associated with nocturia 08/14/2014   Finasteride , flomax .  0-3x nocturia. PSA to 14-->eval urology [facility]. PSA trended back down to about 2.6 per records in 2015 then placed on finasteride - had significant voiding issues when spiked to 14- improved on meds    Bradycardia    CAD (coronary artery disease) 05/26/2016   Coronary CTA_ 50-75% D1 off CT report    Cardiac pacemaker in situ 12/28/2009   SA node dysfunction as cause    Chest pressure 12/01/2015  Overview:  atypical CP, [facility] nuclear perfusion imaging w/ no inarct, no ischemia, EF-40% NICM   Chronic systolic heart failure (HCC) 12/01/2015   Overview:  [facility] nuclear perfusion imaging study, no inarct, no ischemia, EF-40% NICM   CVA (cerebral vascular accident) (HCC)    Double vision 07/04/2018   Elevated LFTs    Erectile dysfunction 01/30/2015   cialis  prn    External hemorrhoids    Fitting or adjustment of cardiac pacemaker 06/07/2019   Overview:  Biotronik [place] DR [id number], RA and RV leads [id number]:  [id number] and [id number]   Former smoker 01/30/2015   Former smoker. 20 pack years quit 92. Patient declined AAA screen.     GERD (gastroesophageal reflux disease)    Gout 11/11/2009   Qualifier: Diagnosis of  By: Gardenia Pao     History of nuclear  stress test 06/07/2019   Overview:  [facility], no inarct, no ischemia, EF-40% NICM   HTN (hypertension)    Hyperglycemia 06/10/2017   110s CBG 2018   Hyperlipidemia 07/12/2007   Crestor  20mg  daily with some myalgias; fish oil. Trig 200-500 despite this. LDL ok.        Insomnia 01/30/2015   ambien  5mg  prn. May refill.     Ischemic optic neuropathy    Left chest pressure 03/19/2015   Other and unspecified hyperlipidemia    Sinoatrial node dysfunction (HCC)    Spinal stenosis    Trigger finger, right middle finger 03/01/2018    ROS:   All systems reviewed and negative except as noted in the HPI.   Past Surgical History:  Procedure Laterality Date   ABSCESS DRAINAGE     righ tposterior neck   CARDIAC CATHETERIZATION  1996   CHOLECYSTECTOMY  02/07/2003   COLONOSCOPY  06/23/2004   EYE SURGERY     left eye   left ingunial hernia  06/08/2011   PACEMAKER INSERTION     PPM GENERATOR CHANGEOUT N/A 07/12/2024   Procedure: PPM GENERATOR CHANGEOUT;  Surgeon: Waddell Danelle ORN, MD;  Location: [facility];  Service: Cardiovascular;  Laterality: N/A;   right hip replacement     2012 Dr. Hiram     Family History  Problem Relation Age of Onset   Lung cancer Mother        mets to brain   Heart attack Mother 32   Hypertension Mother    Brain cancer Mother    Heart attack Father        mid 97s, smoker.    Diabetes Father    Cancer Brother    Diabetes Paternal Grandfather    Colon cancer Neg Hx    Esophageal cancer Neg Hx    Stomach cancer Neg Hx    Rectal cancer Neg Hx      Social History   Socioeconomic History   Marital status: Married    Spouse name: Not on file   Number of children: 1   Years of education: 20   Highest education level: Professional school degree (e.g., MD, DDS, DVM, JD)  Occupational History   Not on file  Tobacco Use   Smoking status: Former    Current packs/day: 0.00    Average packs/day: 1 pack/day for 20.0 years (20.0 ttl pk-yrs)    Types: Cigarettes     Start date: 11/04/1971    Quit date: 11/04/1991    Years since quitting: 32.9   Smokeless tobacco: Never  Vaping Use   Vaping status: Never Used  Substance and Sexual Activity  Alcohol use: Yes    Alcohol/week: 0.0 standard drinks of alcohol    Comment: occ   Drug use: No   Sexual activity: Not on file  Other Topics Concern   Not on file  Social History Narrative   Married. 1 child and 1 step child. 2 grandchildren.       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      Hobbies: swimming (states not fun), travel      Lives in a single story home   Social Drivers of Health   Financial Resource Strain: Low Risk  (02/21/2024)   Overall Financial Resource Strain (CARDIA)    Difficulty of Paying Living Expenses: Not hard at all  Food Insecurity: No Food Insecurity (02/21/2024)   Hunger Vital Sign    Worried About Running Out of Food in the Last Year: Never true    Ran Out of Food in the Last Year: Never true  Transportation Needs: No Transportation Needs (02/21/2024)   PRAPARE - Administrator, Civil Service (Medical): No    Lack of Transportation (Non-Medical): No  Physical Activity: Sufficiently Active (02/21/2024)   Exercise Vital Sign    Days of Exercise per Week: 3 days    Minutes of Exercise per Session: 60 min  Stress: No Stress Concern Present (02/21/2024)   [employer] of Occupational Health - Occupational Stress Questionnaire    Feeling of Stress : Not at all  Social Connections: Unknown (02/21/2024)   Social Connection and Isolation Panel    Frequency of Communication with Friends and Family: More than three times a week    Frequency of Social Gatherings with Friends and Family: Once a week    Attends Religious Services: Patient declined    Database Administrator or Organizations: Yes    Attends Engineer, Structural: More than 4 times per year    Marital Status: Married  Catering Manager Violence: Not At Risk (02/22/2024)   Humiliation, Afraid, Rape, and  Kick questionnaire    Fear of Current or Ex-Partner: No    Emotionally Abused: No    Physically Abused: No    Sexually Abused: No     BP 114/66 (BP Location: Left Arm, Patient Position: Sitting, Cuff Size: Normal)   Pulse 72   Ht 5' 8 (1.727 m)   Wt 171 lb (77.6 kg)   SpO2 98%   BMI 26.00 kg/m   Physical Exam:  Well appearing NAD HEENT: Unremarkable Neck:  No JVD, no thyromegally Lymphatics:  No adenopathy Back:  No CVA tenderness Lungs:  Clear HEART:  Regular rate rhythm, no murmurs, no rubs, no clicks Abd:  soft, positive bowel sounds, no organomegally, no rebound, no guarding Ext:  2 plus pulses, no edema, no cyanosis, no clubbing Skin:  No rashes no nodules Neuro:  CN II through XII intact, motor grossly intact  DEVICE  Normal device function.  See PaceArt for details.   Assess/Plan: Sinus node dysfunction - he has no escape today. He is s/p PPM and doing well with no symptoms. CAD - he denies anginal symptoms. Dyslipidemia - we discussed starting zetia. He would like to wait until he gets back from vacation in a month. I suspect that he will eventually require a PCSK9 inhibitor. PPM -his medtronic DDD PM is working normally. We will follow.  Danelle Zoella Roberti,MD

## 2024-10-12 ENCOUNTER — Ambulatory Visit: Admitting: Family Medicine

## 2024-10-12 ENCOUNTER — Encounter: Payer: Self-pay | Admitting: Family Medicine

## 2024-10-12 VITALS — BP 118/72 | HR 66 | Temp 98.1°F | Ht 68.0 in | Wt 172.4 lb

## 2024-10-12 DIAGNOSIS — E785 Hyperlipidemia, unspecified: Secondary | ICD-10-CM

## 2024-10-12 DIAGNOSIS — R300 Dysuria: Secondary | ICD-10-CM | POA: Diagnosis not present

## 2024-10-12 LAB — LIPOPROTEIN A (LPA): Lipoprotein (a): 11 nmol/L (ref <75)

## 2024-10-12 MED ORDER — CEPHALEXIN 500 MG PO CAPS: 500.0000 mg | ORAL_CAPSULE | Freq: Three times a day (TID) | ORAL | 0 refills | Status: AC

## 2024-10-12 MED ORDER — EZETIMIBE 10 MG PO TABS: 10.0000 mg | ORAL_TABLET | Freq: Every day | ORAL | 3 refills | Status: AC

## 2024-10-12 NOTE — Progress Notes (Signed)
 Phone [phone] In person visit   Subjective:   [name] is a 78 y.o. year old very pleasant male patient who presents for/with See problem oriented charting Chief Complaint  Patient presents with   Urinary Problems    Past Medical History-  Patient Active Problem List   Diagnosis Date Noted   Paroxysmal atrial fibrillation (HCC) 07/27/2019    Priority: High   S/P cardiac cath 06/07/2019    Priority: High   CAD (coronary artery disease) 05/26/2016    Priority: High   Nonischemic cardiomyopathy (HCC) 04/23/2016    Priority: High   Chronic systolic heart failure (HCC) 12/01/2015    Priority: High   Cerebral infarction (HCC) 01/30/2015    Priority: High   Atrial fibrillation and flutter (HCC) 01/01/2015    Priority: High   Ventricular tachycardia, non-sustained (HCC) 01/25/2013    Priority: High   Cardiac pacemaker in situ 12/28/2009    Priority: High   Aortic atherosclerosis 08/01/2020    Priority: Medium    Fatty liver 08/01/2020    Priority: Medium    Double vision 07/04/2018    Priority: Medium    Hyperglycemia 06/10/2017    Priority: Medium    Insomnia 01/30/2015    Priority: Medium    BPH associated with nocturia 08/14/2014    Priority: Medium    Gout 11/11/2009    Priority: Medium    Hyperlipidemia 07/12/2007    Priority: Medium    NEUROPATHY, ISCHEMIC OPTIC 06/02/2007    Priority: Medium    Essential hypertension 06/02/2007    Priority: Medium    Anticoagulation adequate 06/07/2019    Priority: Low   Convergence insufficiency 06/07/2019    Priority: Low   Fitting or adjustment of cardiac pacemaker 06/07/2019    Priority: Low   Pacemaker-dependent due to native cardiac rhythm insufficient to support life 06/07/2019    Priority: Low   Palpitations 06/07/2019    Priority: Low   History of nuclear stress test 06/07/2019    Priority: Low   Non-occlusive coronary artery disease 06/07/2019    Priority: Low   Trigger finger, right middle  finger 03/01/2018    Priority: Low   Solitary pulmonary nodule 05/26/2016    Priority: Low   Chest pressure 12/01/2015    Priority: Low   Left chest pressure 03/19/2015    Priority: Low   Erectile dysfunction 01/30/2015    Priority: Low   Former smoker 01/30/2015    Priority: Low   Allergic rhinitis 10/11/2014    Priority: Low   Sinoatrial node dysfunction (HCC)     Priority: Low   SPINAL STENOSIS, LUMBAR 08/22/2010    Priority: Low   GERD 03/17/2010    Priority: Low   Presence of heart assist device (HCC) 10/05/2024    Medications- reviewed and updated Current Outpatient Medications  Medication Sig Dispense Refill   benazepril  (LOTENSIN ) 5 MG tablet Take 1 tablet by mouth once daily 90 tablet 0   cephALEXin  (KEFLEX ) 500 MG capsule Take 1 capsule (500 mg total) by mouth 3 (three) times daily for 7 days. 21 capsule 0   cyclobenzaprine  (FLEXERIL ) 5 MG tablet Take 1 tablet (5 mg total) by mouth daily as needed. for muscle spams 90 tablet 0   ezetimibe (ZETIA) 10 MG tablet Take 1 tablet (10 mg total) by mouth daily. 90 tablet 3   finasteride  (PROSCAR ) 5 MG tablet Take 5 mg by mouth daily.     fluticasone (FLONASE) 50 MCG/ACT nasal spray Place 2  sprays into both nostrils daily.     gabapentin  (NEURONTIN ) 400 MG capsule Take 1 capsule by mouth twice daily 180 capsule 0   gemfibrozil  (LOPID ) 600 MG tablet TAKE 1 TABLET BY MOUTH TWICE DAILY BEFORE  A  MEAL 180 tablet 3   icosapent Ethyl (VASCEPA) 1 g capsule Take 1 g by mouth 2 (two) times daily.     indomethacin  (INDOCIN ) 25 MG capsule Take 1 capsule (25 mg total) by mouth 2 (two) times daily with a meal. 60 capsule 3   metoprolol  succinate (TOPROL -XL) 25 MG 24 hr tablet Take 1 tablet (25 mg total) by mouth daily. 90 tablet 3   metoprolol  tartrate (LOPRESSOR ) 25 MG tablet Take 1 tablet (25 mg total) by mouth as needed. Take 1 tablet (25 mg total) as needed for heart rate greater than 120 45 tablet 3   Multiple Vitamin (MULTIVITAMIN)  tablet Take 1 tablet by mouth daily.     omeprazole  (PRILOSEC) 20 MG capsule Take 1 capsule by mouth once daily 90 capsule 0   rosuvastatin  (CRESTOR ) 40 MG tablet Take 40 mg by mouth daily.     tadalafil  (CIALIS ) 5 MG tablet TAKE 1 TABLET BY MOUTH DAILY AS NEEDED FOR ERECTILE DYSFUNCTION 90 tablet 3   tamsulosin  (FLOMAX ) 0.4 MG CAPS capsule Take 1 capsule by mouth once daily 90 capsule 0   triamcinolone  cream (KENALOG) 0.1 % 1 Application.     XARELTO  20 MG TABS tablet TAKE 1 TABLET BY MOUTH ONCE DAILY WITH SUPPER 90 tablet 2   zolpidem  (AMBIEN ) 5 MG tablet TAKE 1 TABLET BY MOUTH AT BEDTIME 90 tablet 0   No current facility-administered medications for this visit.     Objective:  BP 118/72 (BP Location: Left Arm, Patient Position: Sitting, Cuff Size: Normal)   Pulse 66   Temp 98.1 F (36.7 C) (Temporal)   Ht 5' 8 (1.727 m)   Wt 172 lb 6.4 oz (78.2 kg)   SpO2 97%   BMI 26.21 kg/m  Gen: NAD, resting comfortably CV: RRR no murmurs rubs or gallops Lungs: CTAB no crackles, wheeze, rhonchi Ext: no edema Skin: warm, dry Rectal: normal tone, diffusely enlarged prostate, no masses or tenderness     Assessment and Plan   # Urinary concerns S: At his physical on 10/05/2024 patient reported 6 days of discomfort in his bladder when he needed to urinate-no dysuria.  Discomfort in lower abdomen resolved after he urinated.  Noted some urgency as well.  Urinalysis showed excellent hydration with no clear UTI and ultimately urine culture showed no growth  He reached out reporting a low-grade temperature in the evening on 10/07/2024- it was subjective- never measured. Got up every 30 minutes to get small volume of urine and felt burning with urination as well as ongoing lower abdominal burning before urination that relieved with urination.   He had some old doxycycline  that he started and felt almost immediate relief- no longer going as frequently as night- 1-2 x which is closer to baseline.  A/P:  Patient with symptoms concerning for UTI that have improved on doxycycline -before his symptoms progressed he had a urine culture which was negative but then his symptoms progressed with dysuria, nocturia, subjective fever that improved on doxycycline  he had at home -He is very concerned with upcoming international travel for a month about recurrence adequate treatment-this is challenging to prove with his self treatment with doxycycline  -We will get urine culture and consider alternate antibiotic but I sent him some  Keflex  to take if he had recurrent symptoms and obviously if he fail to improve despite treatment should seek care even internationally -Significant BPH on exam even with medication without prostatitis being obvious-he remains on finasteride  and tamsulosin    #hyperlipidemia S: Medication: Rosuvastatin  40 mg daily, gemfibrozil  600 mg twice daily, Vascepa 1 g twice daily Lab Results  Component Value Date   CHOL 144 10/06/2024   HDL 43.50 10/06/2024   LDLCALC 65 10/06/2024   LDLDIRECT 70.0 11/16/2019   TRIG 175.0 (H) 10/06/2024   CHOLHDL 3 10/06/2024   A/P: Apolipoprotein B was high and LDL above 55-offered adding Zetia due to mild poor control and he opts-sent this in today but he plans to start when he returns from his trip   Recommended follow up: Return for next already scheduled visit or sooner if needed. Future Appointments  Date Time Provider Department Center  11/24/2024  7:00 AM [facility] DEVICE REMOTES [facility]  02/23/2025  7:00 AM [facility] DEVICE REMOTES [facility]  02/26/2025  2:20 PM [facility] ANNUAL WELLNESS VISIT 1 [facility] [place]  04/05/2025  2:00 PM [name], MD [facility]  05/25/2025  7:00 AM [facility] DEVICE REMOTES [facility]  08/24/2025  7:00 AM [facility] DEVICE REMOTES [facility]  10/08/2025  2:00 PM [name], MD [facility]    Lab/Order associations:   ICD-10-CM   1. Dysuria  R30.0 Urine Culture    2.  Hyperlipidemia, unspecified hyperlipidemia type  E78.5       Meds ordered this encounter  Medications   cephALEXin  (KEFLEX ) 500 MG capsule    Sig: Take 1 capsule (500 mg total) by mouth 3 (three) times daily for 7 days.    Dispense:  21 capsule    Refill:  0   ezetimibe (ZETIA) 10 MG tablet    Sig: Take 1 tablet (10 mg total) by mouth daily.    Dispense:  90 tablet    Refill:  3    Return precautions advised.  Garnette Katrinka, MD

## 2024-10-12 NOTE — Patient Instructions (Addendum)
 Please stop by lab before you go- urine culture If you have mychart- we will send your results within 3 business days of us  receiving them.  If you do not have mychart- we will call you about results within 5 business days of us  receiving them.  *please also note that you will see labs on mychart as soon as they post. I will later go in and write notes on them- will say notes from Dr. Katrinka   We are sending in keflex  for urinary tract infection if you have recurrent symptoms- worsening symptoms such as burning, worsening frequency, worsening # of times peeing at night, worsening urgency  Have a great trip!  Recommended follow up: Return for next already scheduled visit or sooner if needed.

## 2024-10-13 LAB — URINE CULTURE
MICRO NUMBER:: 17231363
Result:: NO GROWTH
SPECIMEN QUALITY:: ADEQUATE

## 2024-10-16 ENCOUNTER — Ambulatory Visit: Payer: Self-pay | Admitting: Family Medicine

## 2024-10-23 ENCOUNTER — Encounter: Admitting: Internal Medicine

## 2024-11-15 ENCOUNTER — Other Ambulatory Visit: Payer: Self-pay | Admitting: Family Medicine

## 2024-11-24 ENCOUNTER — Encounter

## 2024-11-29 ENCOUNTER — Ambulatory Visit: Attending: Internal Medicine

## 2024-11-29 DIAGNOSIS — I495 Sick sinus syndrome: Secondary | ICD-10-CM | POA: Diagnosis not present

## 2024-12-01 ENCOUNTER — Ambulatory Visit: Payer: Self-pay | Admitting: Internal Medicine

## 2024-12-01 LAB — CUP PACEART REMOTE DEVICE CHECK
Battery Remaining Longevity: 139 mo
Battery Voltage: 3.18 V
Brady Statistic AP VP Percent: 34.84 %
Brady Statistic AP VS Percent: 64.43 %
Brady Statistic AS VP Percent: 0.65 %
Brady Statistic AS VS Percent: 0.08 %
Brady Statistic RA Percent Paced: 99.25 %
Brady Statistic RV Percent Paced: 35.49 %
Date Time Interrogation Session: 20251231172021
Implantable Lead Connection Status: 753985
Implantable Lead Connection Status: 753985
Implantable Lead Implant Date: 20031017
Implantable Lead Implant Date: 20031017
Implantable Lead Location: 753859
Implantable Lead Location: 753860
Implantable Lead Model: 5076
Implantable Lead Model: 5076
Implantable Pulse Generator Implant Date: 20250813
Lead Channel Impedance Value: 323 Ohm
Lead Channel Impedance Value: 3401 Ohm
Lead Channel Impedance Value: 342 Ohm
Lead Channel Impedance Value: 380 Ohm
Lead Channel Pacing Threshold Amplitude: 0.875 V
Lead Channel Pacing Threshold Amplitude: 1.375 V
Lead Channel Pacing Threshold Pulse Width: 0.4 ms
Lead Channel Pacing Threshold Pulse Width: 0.4 ms
Lead Channel Sensing Intrinsic Amplitude: 1.75 mV
Lead Channel Sensing Intrinsic Amplitude: 1.75 mV
Lead Channel Sensing Intrinsic Amplitude: 8.125 mV
Lead Channel Sensing Intrinsic Amplitude: 8.125 mV
Lead Channel Setting Pacing Amplitude: 1.5 V
Lead Channel Setting Pacing Amplitude: 2.75 V
Lead Channel Setting Pacing Pulse Width: 0.4 ms
Lead Channel Setting Sensing Sensitivity: 1.2 mV
Zone Setting Status: 755011

## 2024-12-04 ENCOUNTER — Ambulatory Visit: Payer: Self-pay

## 2024-12-04 NOTE — Telephone Encounter (Signed)
 Severe cold and cough x 3 weeks FYI Only or Action Required?: FYI only for provider: appointment scheduled on 12/07/24.  Patient was last seen in primary care on 10/12/2024 by Nathan Garnette KIDD, MD.  Called Nurse Triage reporting URI.  Symptoms began several weeks ago.  Interventions attempted: OTC medications: Sudafed.  Symptoms are: unchanged.  Triage Disposition: See PCP When Office is Open (Within 3 Days)  Patient/caregiver understands and will follow disposition?: Yes   Copied from CRM #[id number]. Topic: Clinical - Red Word Triage >> Dec 04, 2024  8:40 AM Ahlexyia S wrote: Red Word that prompted transfer to Nurse Triage: Pt called in stating that he has been having a worsening cough for about 3 weeks, chest and nasal congestion. Pt also stated that his cough gets so bad he can barely breath correctly and he sometimes has a mild fever. Pt denied having any other symptoms. Warm transferred to nurse triage. Reason for Disposition  Cough has been present for > 3 weeks  Answer Assessment - Initial Assessment Questions 1. ONSET: When did the cough begin?      3 weeks  2. SEVERITY: How bad is the cough today?      It's bad enough that I feel like I'm choking at times when I'm coughing. My throat is sore from coughing. I think I've irritated some muscles around my waist from coughing.  3. SPUTUM: Describe the color of your sputum (e.g., none, dry cough; clear, white, yellow, green)     I'm not looking that much, reports phlegm.   4. HEMOPTYSIS: Are you coughing up any blood? If Yes, ask: How much? (e.g., flecks, streaks, tablespoons, etc.)     Yes, one time this morning, states I think it was from my nose.   5. DIFFICULTY BREATHING: Are you having difficulty breathing? If Yes, ask: How bad is it? (e.g., mild, moderate, severe)      Denies.   6. FEVER: Do you have a fever? If Yes, ask: What is your temperature, how was it measured, and when did it start?      I think I've had mild fever on and off, nothing high. Denies fever at this time.   7. CARDIAC HISTORY: Do you have any history of heart disease? (e.g., heart attack, congestive heart failure)      Extensive, see patient history.   8. LUNG HISTORY: Do you have any history of lung disease?  (e.g., pulmonary embolus, asthma, emphysema)     Denies   10. OTHER SYMPTOMS: Do you have any other symptoms? (e.g., runny nose, wheezing, chest pain)       Watery eyes, running nose   Taking Sudafed, helping nasal congestion. Last took it last night. Denies CP and SOB.  Protocols used: Cough - Acute Productive-A-AH

## 2024-12-04 NOTE — Progress Notes (Deleted)
 Remote PPM Transmission

## 2024-12-05 NOTE — Progress Notes (Signed)
 Remote PPM Transmission

## 2024-12-07 ENCOUNTER — Encounter: Payer: Self-pay | Admitting: Family Medicine

## 2024-12-07 ENCOUNTER — Ambulatory Visit: Admitting: Family Medicine

## 2024-12-07 VITALS — BP 112/62 | HR 63 | Temp 98.6°F | Ht 68.0 in | Wt 171.0 lb

## 2024-12-07 DIAGNOSIS — J029 Acute pharyngitis, unspecified: Secondary | ICD-10-CM | POA: Diagnosis not present

## 2024-12-07 DIAGNOSIS — B9689 Other specified bacterial agents as the cause of diseases classified elsewhere: Secondary | ICD-10-CM

## 2024-12-07 DIAGNOSIS — I5022 Chronic systolic (congestive) heart failure: Secondary | ICD-10-CM | POA: Diagnosis not present

## 2024-12-07 DIAGNOSIS — Z95811 Presence of heart assist device: Secondary | ICD-10-CM

## 2024-12-07 DIAGNOSIS — R051 Acute cough: Secondary | ICD-10-CM

## 2024-12-07 DIAGNOSIS — J329 Chronic sinusitis, unspecified: Secondary | ICD-10-CM

## 2024-12-07 DIAGNOSIS — R059 Cough, unspecified: Secondary | ICD-10-CM

## 2024-12-07 DIAGNOSIS — I4892 Unspecified atrial flutter: Secondary | ICD-10-CM | POA: Diagnosis not present

## 2024-12-07 DIAGNOSIS — I4891 Unspecified atrial fibrillation: Secondary | ICD-10-CM

## 2024-12-07 LAB — POCT INFLUENZA A/B
Influenza A, POC: NEGATIVE
Influenza B, POC: NEGATIVE

## 2024-12-07 LAB — POC COVID19 BINAXNOW: SARS Coronavirus 2 Ag: NEGATIVE

## 2024-12-07 LAB — POCT RAPID STREP A (OFFICE): Rapid Strep A Screen: NEGATIVE

## 2024-12-07 MED ORDER — AMOXICILLIN-POT CLAVULANATE 875-125 MG PO TABS: 1.0000 | ORAL_TABLET | Freq: Two times a day (BID) | ORAL | 0 refills | Status: AC

## 2024-12-07 NOTE — Patient Instructions (Addendum)
 Sinus infection/Sinusitis Bacterial based on: Symptoms >10 days as well as  double sickening Treat with Augmentin  antibiotic for 7 days If no better after 7 days he will reach out to us  and we will likely order CXR as duration at that point would be nearly a month Avoid decongestants Sinus rinses may help Also if eye issue does not improve- worth seeing eye doctor. If persists beyond that or worsens consider imaging  As far as your prolonged travel- happy to write/support for 90 day supply of all medicines- they can contact our office from pharmacy if needed [phone]  Finally, we reviewed reasons to return to care including if symptoms worsen or persist  (despite above treatments) or new concerns arise (particularly fever or shortness of breath)

## 2024-12-07 NOTE — Progress Notes (Signed)
 " Phone [phone] In person visit   Subjective:   [name] is a 79 y.o. year old very pleasant male patient who presents for/with See problem oriented charting Chief Complaint  Patient presents with   Cough    Pt states he has had flu symptoms x3 weeks; post nasal drip, cough, chest congestion; would like flu test today; sore throat, pt requested flu, covid, strep test;     Past Medical History-  Patient Active Problem List   Diagnosis Date Noted   Paroxysmal atrial fibrillation (HCC) 07/27/2019    Priority: High   S/P cardiac cath 06/07/2019    Priority: High   CAD (coronary artery disease) 05/26/2016    Priority: High   Nonischemic cardiomyopathy (HCC) 04/23/2016    Priority: High   Chronic systolic heart failure (HCC) 12/01/2015    Priority: High   Cerebral infarction (HCC) 01/30/2015    Priority: High   Atrial fibrillation and flutter (HCC) 01/01/2015    Priority: High   Ventricular tachycardia, non-sustained (HCC) 01/25/2013    Priority: High   Cardiac pacemaker in situ 12/28/2009    Priority: High   Aortic atherosclerosis 08/01/2020    Priority: Medium    Fatty liver 08/01/2020    Priority: Medium    Double vision 07/04/2018    Priority: Medium    Hyperglycemia 06/10/2017    Priority: Medium    Insomnia 01/30/2015    Priority: Medium    BPH associated with nocturia 08/14/2014    Priority: Medium    Gout 11/11/2009    Priority: Medium    Hyperlipidemia 07/12/2007    Priority: Medium    NEUROPATHY, ISCHEMIC OPTIC 06/02/2007    Priority: Medium    Essential hypertension 06/02/2007    Priority: Medium    Anticoagulation adequate 06/07/2019    Priority: Low   Convergence insufficiency 06/07/2019    Priority: Low   Fitting or adjustment of cardiac pacemaker 06/07/2019    Priority: Low   Pacemaker-dependent due to native cardiac rhythm insufficient to support life 06/07/2019    Priority: Low   Palpitations 06/07/2019    Priority: Low    History of nuclear stress test 06/07/2019    Priority: Low   Non-occlusive coronary artery disease 06/07/2019    Priority: Low   Trigger finger, right middle finger 03/01/2018    Priority: Low   Solitary pulmonary nodule 05/26/2016    Priority: Low   Chest pressure 12/01/2015    Priority: Low   Left chest pressure 03/19/2015    Priority: Low   Erectile dysfunction 01/30/2015    Priority: Low   Former smoker 01/30/2015    Priority: Low   Allergic rhinitis 10/11/2014    Priority: Low   Sinoatrial node dysfunction (HCC)     Priority: Low   SPINAL STENOSIS, LUMBAR 08/22/2010    Priority: Low   GERD 03/17/2010    Priority: Low   Presence of heart assist device (HCC) 10/05/2024    Medications- reviewed and updated Current Outpatient Medications  Medication Sig Dispense Refill   amoxicillin -clavulanate (AUGMENTIN ) 875-125 MG tablet Take 1 tablet by mouth 2 (two) times daily for 7 days. 14 tablet 0   benazepril  (LOTENSIN ) 5 MG tablet Take 1 tablet by mouth once daily 90 tablet 0   cyclobenzaprine  (FLEXERIL ) 5 MG tablet Take 1 tablet (5 mg total) by mouth daily as needed. for muscle spams 90 tablet 0   ezetimibe  (ZETIA ) 10 MG tablet Take 1 tablet (10 mg total) by mouth  daily. 90 tablet 3   finasteride  (PROSCAR ) 5 MG tablet Take 5 mg by mouth daily.     fluticasone (FLONASE) 50 MCG/ACT nasal spray Place 2 sprays into both nostrils daily.     gabapentin  (NEURONTIN ) 400 MG capsule Take 1 capsule by mouth twice daily 180 capsule 0   gemfibrozil  (LOPID ) 600 MG tablet TAKE 1 TABLET BY MOUTH TWICE DAILY BEFORE  A  MEAL 180 tablet 3   icosapent Ethyl (VASCEPA) 1 g capsule Take 1 g by mouth 2 (two) times daily.     indomethacin  (INDOCIN ) 25 MG capsule Take 1 capsule (25 mg total) by mouth 2 (two) times daily with a meal. 60 capsule 3   metoprolol  succinate (TOPROL -XL) 25 MG 24 hr tablet Take 1 tablet (25 mg total) by mouth daily. 90 tablet 3   metoprolol  tartrate (LOPRESSOR ) 25 MG tablet Take  1 tablet (25 mg total) by mouth as needed. Take 1 tablet (25 mg total) as needed for heart rate greater than 120 45 tablet 3   Multiple Vitamin (MULTIVITAMIN) tablet Take 1 tablet by mouth daily.     omeprazole  (PRILOSEC) 20 MG capsule Take 1 capsule by mouth once daily 90 capsule 0   rosuvastatin  (CRESTOR ) 40 MG tablet Take 40 mg by mouth daily.     tadalafil  (CIALIS ) 5 MG tablet TAKE 1 TABLET BY MOUTH DAILY AS NEEDED FOR ERECTILE DYSFUNCTION 90 tablet 3   tamsulosin  (FLOMAX ) 0.4 MG CAPS capsule Take 1 capsule by mouth once daily 90 capsule 0   triamcinolone  cream (KENALOG) 0.1 % 1 Application.     XARELTO  20 MG TABS tablet TAKE 1 TABLET BY MOUTH ONCE DAILY WITH SUPPER 90 tablet 2   zolpidem  (AMBIEN ) 5 MG tablet TAKE 1 TABLET BY MOUTH AT BEDTIME 90 tablet 0   No current facility-administered medications for this visit.     Objective:  BP 112/62 (BP Location: Left Arm, Patient Position: Sitting, Cuff Size: Normal)   Pulse 63   Temp 98.6 F (37 C) (Temporal)   Ht 5' 8 (1.727 m)   Wt 171 lb (77.6 kg)   SpO2 96%   BMI 26.00 kg/m  Gen: NAD, resting comfortably  HEENT: Turbinates erythematous with minimal drainage, TM normal, pharynx mildly erythematous with no tonsilar exudate or edema, maxillary sinus tenderness CV: Regular heart rate no murmurs rubs or gallops Lungs: CTAB no crackles, wheeze, rhonchi Ext: no edema Skin: warm, dry Neuro: Hard of hearing    Assessment and Plan   # Postnasal drip, cough, chest congestion S: Patient with symptoms for about 3 weeks.  He also complains of sore throat. Seemed to get better around Christmas then worsened after that. Coughing fits, sneezing as well. Lying down mucus comes into throat. Was able to swim Monday at least. Mild fever maybe early on. No shortness of breath . Mild improvement this week.  - sudafed has been tried- advised against- didn't take last night or this morning - sinus congestion and pressure noted frontal A/P: -Flu,  COVID, strep test negative today # Sinus infection/Sinusitis- congestion, sinus pressure, cough Bacterial based on: Symptoms >10 days as well as  double sickening Treat with Augmentin  antibiotic for 7 days If no better after 7 days he will reach out to us  and we will likely order CXR as duration at that point would be nearly a month Avoid decongestants Sinus rinses may help  #eye discomfort S:in [place] in December and landed on left hip after slipping on ice .  Had significant bruise on left leg. Did not hit his head.  - left eye doesn't feel right after that- no blurry vision worse than usual. Felt almost immediately different. No double vision.  -also gets some minor headache(s) and some neck discomfort - no confusion A/P: Mild discomfort after prior fall along with some neck pain-could have had a whiplash type injury.  I do not strongly suspect concussion but possible.  Having some intermittent headaches which is atypical for him-I called him after visit and recommended if he is not improving after antibiotics (possibility of sinus related pain but doubt) then I would recommend seeing his eye doctor and if at that point they do not think is related to the eyes I might consider neuroimaging such as MRI of the brain without contrast although I am not sure if he is eligible for that with his pacemaker-may have to start with CT-doubt significant bleed given several weeks of symptoms without worsening-in fact I would not be surprised if this gradually improves  # Presence of heart assist device with pacemaker noted given known heart block-continue cardiology follow-up and as noted above could affect imaging plans  # Atrial fibrillation S: Rate controlled with metoprolol  25 mg extended release Anticoagulated with Xarelto   A/P: appropriately anticoagulated and rate controlled- continue current medicine   # Systolic heart failure-compliant with benazepril  5 mg and metoprolol  25 mg extended  release.  Does not require Lasix thankfully.  No reported weight gain outside of the holidays or edema-appears euvolemic-continue current medication   # Upcoming travel-I am going to write 90-day prescriptions/anything we can do to help with travel issues  Recommended follow up: No follow-ups on file. Future Appointments  Date Time Provider Department Center  02/26/2025  2:20 PM [facility] [place] VISIT 1 [facility]  02/28/2025  7:00 AM [facility] DEVICE REMOTES [facility]  04/05/2025  2:00 PM [name], MD [facility]  05/30/2025  7:00 AM [facility] DEVICE REMOTES [facility]  10/08/2025  2:00 PM [name], MD [facility]    Lab/Order associations:   ICD-10-CM   1. Bacterial sinusitis  J32.9    B96.89     2. Acute cough  R05.1 POCT rapid strep A    POCT Influenza A/B    POC COVID-19    3. Sore throat  J02.9 POCT rapid strep A    POCT Influenza A/B    POC COVID-19    4. Atrial fibrillation and flutter (HCC)  I48.91    I48.92     5. Chronic systolic heart failure (HCC)  P49.77     6. Presence of heart assist device (HCC)  Z95.811       Meds ordered this encounter  Medications   amoxicillin -clavulanate (AUGMENTIN ) 875-125 MG tablet    Sig: Take 1 tablet by mouth 2 (two) times daily for 7 days.    Dispense:  14 tablet    Refill:  0    Return precautions advised.  Garnette Katrinka, MD  "

## 2024-12-08 ENCOUNTER — Ambulatory Visit: Payer: Self-pay | Admitting: *Deleted

## 2024-12-08 ENCOUNTER — Other Ambulatory Visit: Payer: Self-pay | Admitting: Family Medicine

## 2024-12-08 DIAGNOSIS — J329 Chronic sinusitis, unspecified: Secondary | ICD-10-CM

## 2024-12-08 MED ORDER — CEFUROXIME AXETIL 250 MG PO TABS: 250.0000 mg | ORAL_TABLET | Freq: Two times a day (BID) | ORAL | 0 refills | Status: AC

## 2024-12-08 NOTE — Telephone Encounter (Signed)
 Pt states he has called multiple times over the past 2 days reporting GI upset and diarrhea after starting Augmentin . Asked to have this changed and was told it would be handled by EOD. Pt very upset. Called the on call provider, LVM to call back about if a medication can be prescribed. Attempted to call the office, no answer. Pt wanting to speak with the manager of the clinic and stated that what was happening is malpractice. Advised that the clinic will need to call him back tomorrow and that if nothing is to be prescribed by the on-call provider that the pt could go to [facility] to have a different abx prescribed.

## 2024-12-08 NOTE — Progress Notes (Signed)
 Patient developed diarrhea after starting Augmentin  for sinusitis.  Rx for Ceftin  250 twice daily for 10 days was sent to his pharmacy.  Note added to prescription for patient to discontinue the Augmentin .

## 2024-12-08 NOTE — Telephone Encounter (Signed)
 This encounter was created in error - please disregard.

## 2024-12-08 NOTE — Telephone Encounter (Signed)
 MyChart message that was sent was sent to Dr. Katrinka for review under a separate encounter. This will be handled by EOD today after clinical hours.

## 2024-12-08 NOTE — Telephone Encounter (Signed)
 FYI Only or Action Required?: Action required by provider: request new Rx.  Patient was last seen in primary care on 12/07/2024 by Nathan Garnette KIDD, MD.  Called Nurse Triage reporting Medication Reaction (SE- diarrhea, upset stomach).  Symptoms began yesterday.  Interventions attempted: Rest, hydration, or home remedies.  Symptoms are: unchanged.  Triage Disposition: See HCP Within 4 Hours (Or PCP Triage)  Patient/caregiver understands and will follow disposition?: No, wishes to speak with PCP  Patient calling with follow up call to MyChart message- needs to change antibiotic   Copied from CRM #[id number]. Topic: Clinical - Pink Word Triage >> Dec 08, 2024  3:03 PM [name] wrote: Pink Word triggered transfer to Nurse Triage. See Triage Message for details. >> Dec 08, 2024  4:01 PM [name] wrote: Patient calling once again, states he was under the impression someone would call him back within the hour and he still hasn't spoken with anyone in regards to the symptoms he's experiencing.  >> Dec 08, 2024  3:04 PM [name] wrote: Reason for Triage: pt called and stated that he is experiencing side effects of stomach discomfort and diarrhea from taking amoxicillin . He's asking for alternative meds. Reason for Disposition  SEVERE diarrhea (e.g., 7 or more times / day more than normal)  Answer Assessment - Initial Assessment Questions 1. ANTIBIOTIC: What antibiotic are you taking? How many times per day?     amoxicillin -clavulanate (AUGMENTIN ) 875-125 MG tablet 2. ANTIBIOTIC ONSET: When was the antibiotic started?     Last night after dinner 3. DIARRHEA SEVERITY: How bad is the diarrhea? How many more stools have you had in the past 24 hours than normal?      Upset stomach- diarrhea- soiled sheets, 2 times today-patient only took one dose 4. ONSET: When did the diarrhea begin?      During the night- was up 15 times 5. BM CONSISTENCY: How loose or watery is the diarrhea?       both 6. VOMITING: Are you also vomiting? If Yes, ask: How many times in the past 24 hours?      no 7. ABDOMEN PAIN: Are you having any abdomen pain? If Yes, ask: What does it feel like? (e.g., crampy, dull, intermittent, constant)      no 8. ABDOMEN PAIN SEVERITY: If present, ask: How bad is the pain?  (e.g., Scale 1-10; mild, moderate, or severe)     na 9. ORAL INTAKE: If vomiting, Have you been able to drink liquids? How much liquids have you had in the past 24 hours?     Patient has been drinking 10. HYDRATION: Any signs of dehydration? (e.g., dry mouth [not just dry lips], too weak to stand, dizziness, new weight loss) When did you last urinate?       Lack of energy 11. EXPOSURE: Have you traveled to a foreign country recently? Have you been exposed to anyone with diarrhea? Could you have eaten any food that was spoiled?       no 12. OTHER SYMPTOMS: Do you have any other symptoms? (e.g., fever, blood in stool)       headache  Protocols used: Diarrhea on Antibiotics-A-AH

## 2024-12-08 NOTE — Telephone Encounter (Signed)
 Was able to reach the on call provider. He will prescribe a different abx to the pharmacy below:.    [facility] [address] [place] - [address]   Called the pt to notify.

## 2024-12-11 ENCOUNTER — Telehealth: Payer: Self-pay | Admitting: Family Medicine

## 2024-12-11 NOTE — Telephone Encounter (Signed)
 Copied from CRM #[id number]. Topic: Clinical - Pink Word Triage >> Dec 08, 2024  3:03 PM [name] wrote: Pink Word triggered transfer to Nurse Triage. See Triage Message for details. >> Dec 08, 2024  4:56 PM Delon T wrote: Calling again for follow up on getting new antibiotic >> Dec 08, 2024  4:01 PM [name] wrote: Patient calling once again, states he was under the impression someone would call him back within the hour and he still hasn't spoken with anyone in regards to the symptoms he's experiencing.  >> Dec 08, 2024  3:04 PM [name] wrote: Reason for Triage: pt called and stated that he is experiencing side effects of stomach discomfort and diarrhea from taking amoxicillin . He's asking for alternative meds.

## 2024-12-13 ENCOUNTER — Other Ambulatory Visit: Payer: Self-pay | Admitting: Sports Medicine

## 2024-12-13 DIAGNOSIS — G8929 Other chronic pain: Secondary | ICD-10-CM

## 2024-12-13 DIAGNOSIS — M51362 Other intervertebral disc degeneration, lumbar region with discogenic back pain and lower extremity pain: Secondary | ICD-10-CM

## 2024-12-13 DIAGNOSIS — M47816 Spondylosis without myelopathy or radiculopathy, lumbar region: Secondary | ICD-10-CM

## 2024-12-18 NOTE — Telephone Encounter (Signed)
 I ordered chest x-ray- I think you can walk in but I'm not 100% sure and no one else here this evening- ill ask my team to message you tomorrow on that

## 2024-12-18 NOTE — Addendum Note (Signed)
 Addended by: [name] on: 12/18/2024 05:47 PM   Modules accepted: Orders

## 2024-12-19 ENCOUNTER — Ambulatory Visit

## 2024-12-19 DIAGNOSIS — R059 Cough, unspecified: Secondary | ICD-10-CM

## 2024-12-26 ENCOUNTER — Telehealth: Payer: Self-pay | Admitting: Family Medicine

## 2024-12-26 NOTE — Telephone Encounter (Signed)
 LVM to be seen sooner with any available provider.

## 2024-12-28 ENCOUNTER — Ambulatory Visit: Payer: Self-pay | Admitting: Family Medicine

## 2025-01-01 ENCOUNTER — Telehealth: Admitting: Family Medicine

## 2025-01-01 ENCOUNTER — Ambulatory Visit: Admitting: Family Medicine

## 2025-01-01 VITALS — Ht 68.0 in | Wt 170.0 lb

## 2025-01-01 DIAGNOSIS — I10 Essential (primary) hypertension: Secondary | ICD-10-CM | POA: Diagnosis not present

## 2025-01-01 DIAGNOSIS — M1812 Unilateral primary osteoarthritis of first carpometacarpal joint, left hand: Secondary | ICD-10-CM | POA: Diagnosis not present

## 2025-01-01 DIAGNOSIS — J329 Chronic sinusitis, unspecified: Secondary | ICD-10-CM | POA: Diagnosis not present

## 2025-01-01 DIAGNOSIS — B9689 Other specified bacterial agents as the cause of diseases classified elsewhere: Secondary | ICD-10-CM

## 2025-01-01 MED ORDER — PREDNISONE 20 MG PO TABS: ORAL_TABLET | ORAL | 0 refills | Status: AC

## 2025-01-01 MED ORDER — DOXYCYCLINE HYCLATE 100 MG PO TABS: 100.0000 mg | ORAL_TABLET | Freq: Two times a day (BID) | ORAL | 0 refills | Status: AC

## 2025-02-23 ENCOUNTER — Encounter

## 2025-02-26 ENCOUNTER — Ambulatory Visit

## 2025-02-28 ENCOUNTER — Ambulatory Visit

## 2025-04-05 ENCOUNTER — Ambulatory Visit: Admitting: Family Medicine

## 2025-04-12 ENCOUNTER — Ambulatory Visit: Admitting: Student in an Organized Health Care Education/Training Program

## 2025-05-25 ENCOUNTER — Encounter

## 2025-05-30 ENCOUNTER — Ambulatory Visit

## 2025-08-24 ENCOUNTER — Encounter

## 2025-10-08 ENCOUNTER — Encounter: Admitting: Family Medicine
